# Patient Record
Sex: Female | Born: 1950 | Race: White | Hispanic: No | Marital: Married | Smoking: Former smoker
Health system: Southern US, Community
[De-identification: ages and names within clinical notes are randomized; demographics above are authoritative.]

## PROBLEM LIST (undated history)

## (undated) DIAGNOSIS — R609 Edema, unspecified: Secondary | ICD-10-CM

## (undated) DIAGNOSIS — I4891 Unspecified atrial fibrillation: Secondary | ICD-10-CM

## (undated) DIAGNOSIS — R002 Palpitations: Secondary | ICD-10-CM

## (undated) DIAGNOSIS — N183 Chronic kidney disease, stage 3 unspecified: Secondary | ICD-10-CM

## (undated) DIAGNOSIS — R079 Chest pain, unspecified: Secondary | ICD-10-CM

## (undated) DIAGNOSIS — I10 Essential (primary) hypertension: Secondary | ICD-10-CM

## (undated) DIAGNOSIS — R0600 Dyspnea, unspecified: Secondary | ICD-10-CM

## (undated) DIAGNOSIS — E119 Type 2 diabetes mellitus without complications: Secondary | ICD-10-CM

## (undated) DIAGNOSIS — K829 Disease of gallbladder, unspecified: Secondary | ICD-10-CM

## (undated) DIAGNOSIS — J302 Other seasonal allergic rhinitis: Secondary | ICD-10-CM

## (undated) DIAGNOSIS — R262 Difficulty in walking, not elsewhere classified: Secondary | ICD-10-CM

## (undated) DIAGNOSIS — K5900 Constipation, unspecified: Secondary | ICD-10-CM

## (undated) DIAGNOSIS — IMO0002 Reserved for concepts with insufficient information to code with codable children: Secondary | ICD-10-CM

## (undated) DIAGNOSIS — K219 Gastro-esophageal reflux disease without esophagitis: Secondary | ICD-10-CM

## (undated) DIAGNOSIS — F419 Anxiety disorder, unspecified: Secondary | ICD-10-CM

## (undated) DIAGNOSIS — R0602 Shortness of breath: Secondary | ICD-10-CM

## (undated) DIAGNOSIS — F32A Depression, unspecified: Secondary | ICD-10-CM

## (undated) DIAGNOSIS — M1990 Unspecified osteoarthritis, unspecified site: Secondary | ICD-10-CM

## (undated) DIAGNOSIS — D649 Anemia, unspecified: Secondary | ICD-10-CM

## (undated) DIAGNOSIS — M4800 Spinal stenosis, site unspecified: Secondary | ICD-10-CM

## (undated) DIAGNOSIS — E785 Hyperlipidemia, unspecified: Secondary | ICD-10-CM

## (undated) DIAGNOSIS — M7989 Other specified soft tissue disorders: Secondary | ICD-10-CM

## (undated) DIAGNOSIS — G4733 Obstructive sleep apnea (adult) (pediatric): Principal | ICD-10-CM

## (undated) DIAGNOSIS — R9431 Abnormal electrocardiogram [ECG] [EKG]: Secondary | ICD-10-CM

## (undated) DIAGNOSIS — R5383 Other fatigue: Secondary | ICD-10-CM

## (undated) DIAGNOSIS — J329 Chronic sinusitis, unspecified: Secondary | ICD-10-CM

## (undated) DIAGNOSIS — M545 Low back pain: Secondary | ICD-10-CM

## (undated) HISTORY — DX: Unspecified atrial fibrillation: I48.91

## (undated) HISTORY — DX: Anemia, unspecified: D64.9

## (undated) HISTORY — DX: Obstructive sleep apnea (adult) (pediatric): G47.33

## (undated) HISTORY — DX: Disease of gallbladder, unspecified: K82.9

## (undated) HISTORY — DX: Abnormal electrocardiogram (ECG) (EKG): R94.31

## (undated) HISTORY — DX: Depression, unspecified: F32.A

## (undated) HISTORY — DX: Anxiety disorder, unspecified: F41.9

## (undated) HISTORY — DX: Shortness of breath: R06.02

## (undated) HISTORY — DX: Edema, unspecified: R60.9

## (undated) HISTORY — DX: Spinal stenosis, site unspecified: M48.00

## (undated) HISTORY — DX: Other fatigue: R53.83

## (undated) HISTORY — DX: Unspecified osteoarthritis, unspecified site: M19.90

## (undated) HISTORY — DX: Chronic kidney disease, stage 3 unspecified: N18.30

## (undated) HISTORY — DX: Difficulty in walking, not elsewhere classified: R26.2

## (undated) HISTORY — PX: SKIN CANCER EXCISION: SHX779

## (undated) HISTORY — DX: Other specified soft tissue disorders: M79.89

## (undated) HISTORY — DX: Reserved for concepts with insufficient information to code with codable children: IMO0002

## (undated) HISTORY — DX: Constipation, unspecified: K59.00

## (undated) HISTORY — DX: Other seasonal allergic rhinitis: J30.2

## (undated) HISTORY — DX: Essential (primary) hypertension: I10

## (undated) HISTORY — PX: CHOLECYSTECTOMY: SHX55

## (undated) HISTORY — DX: Hyperlipidemia, unspecified: E78.5

## (undated) HISTORY — DX: Chest pain, unspecified: R07.9

## (undated) HISTORY — DX: Type 2 diabetes mellitus without complications: E11.9

## (undated) HISTORY — DX: Dyspnea, unspecified: R06.00

## (undated) HISTORY — DX: Gastro-esophageal reflux disease without esophagitis: K21.9

## (undated) HISTORY — DX: Chronic sinusitis, unspecified: J32.9

## (undated) HISTORY — DX: Low back pain: M54.5

## (undated) HISTORY — DX: Palpitations: R00.2

## (undated) HISTORY — PX: HEMORRHOID SURGERY: SHX153

## (undated) HISTORY — PX: TONSILLECTOMY: SUR1361

---

## 2006-05-20 ENCOUNTER — Emergency Department (HOSPITAL_COMMUNITY): Admission: EM | Admit: 2006-05-20 | Discharge: 2006-05-21 | Payer: Self-pay | Admitting: Emergency Medicine

## 2009-03-28 ENCOUNTER — Emergency Department (HOSPITAL_COMMUNITY): Admission: EM | Admit: 2009-03-28 | Discharge: 2009-03-28 | Payer: Self-pay | Admitting: Emergency Medicine

## 2009-03-28 ENCOUNTER — Encounter: Payer: Self-pay | Admitting: Orthopedic Surgery

## 2009-12-20 ENCOUNTER — Ambulatory Visit: Payer: Self-pay | Admitting: Orthopedic Surgery

## 2009-12-20 DIAGNOSIS — M545 Low back pain, unspecified: Secondary | ICD-10-CM

## 2009-12-20 DIAGNOSIS — IMO0002 Reserved for concepts with insufficient information to code with codable children: Secondary | ICD-10-CM | POA: Insufficient documentation

## 2009-12-20 DIAGNOSIS — M4800 Spinal stenosis, site unspecified: Secondary | ICD-10-CM

## 2009-12-20 HISTORY — DX: Low back pain, unspecified: M54.50

## 2009-12-20 HISTORY — DX: Spinal stenosis, site unspecified: M48.00

## 2009-12-20 HISTORY — DX: Reserved for concepts with insufficient information to code with codable children: IMO0002

## 2009-12-25 ENCOUNTER — Telehealth: Payer: Self-pay | Admitting: Orthopedic Surgery

## 2009-12-25 ENCOUNTER — Encounter: Payer: Self-pay | Admitting: Orthopedic Surgery

## 2010-01-03 ENCOUNTER — Ambulatory Visit: Payer: Self-pay | Admitting: Orthopedic Surgery

## 2010-01-24 ENCOUNTER — Encounter (INDEPENDENT_AMBULATORY_CARE_PROVIDER_SITE_OTHER): Payer: Self-pay | Admitting: *Deleted

## 2010-11-12 NOTE — Progress Notes (Signed)
Summary: MRI appointment.  Phone Note Outgoing Call   Call placed by: Waldon Reining,  December 25, 2009 9:31 AM Call placed to: Patient Action Taken: Appt scheduled Summary of Call: Patient has an MRI appointment at [facility] on 12-25-09 at 12:15. Patient does not have insurance. Patient will follow up back here for results.

## 2010-11-12 NOTE — Assessment & Plan Note (Signed)
Summary: 2 WK RE-CK BACK/REVIEW MRI [facility]   Visit Type:  Follow-up Referring Provider:  Vertis Kelch NP Primary Provider:  [facility] health dep  CC:  recheck back.  History of Present Illness: 60 years old, history of bilateral leg pain, hip pain, back pain since for MRI of her lumbar spine.  An MRI of the spine shows multilevel degenerative disc disease. Moderate foraminal stenosis at multiple levels consistent with spinal stenosis and degenerative arthritis of the lumbar spine.  Ibuprofen 800 for pain, no relief.  No steroids or pain medication.  Review of the MRI report , and the films, show that there is indeed, multilevel degenerative disc disease, and degenerative condition of multilevels, spinal stenosis.  Recommend anti-inflammatory medications, exercise, injections, can be done as well.  Patient cannot afford to go to therapy at this time.      Allergies (verified): No Known Drug Allergies  Review of Systems Neurologic:  Denies numbness, tingling, and unsteady gait.   Medications Added to Medication List This Visit: 1)  Diclofenac Potassium 50 Mg Tabs (Diclofenac potassium) .Marland Kitchen.. 1 by mouth two times a day  Other Orders: Est. Patient Level III (25366)  Patient Instructions: 1)  Take medication  2)  walk for xrays  3)  physical therapy is available  4)  Please schedule a follow-up appointment as needed. Prescriptions: DICLOFENAC POTASSIUM 50 MG TABS (DICLOFENAC POTASSIUM) 1 by mouth two times a day  #60 x 5   Entered and Authorized by:   Fuller Canada MD   Signed by:   Fuller Canada MD on 01/03/2010   Method used:   Print then Give to Patient   RxID:   [id number]

## 2010-11-12 NOTE — Miscellaneous (Signed)
Summary: diclofenac 75 4 dollar plan  Clinical Lists Changes  Medications: Changed medication from DICLOFENAC POTASSIUM 50 MG TABS (DICLOFENAC POTASSIUM) 1 by mouth two times a day to DICLOFENAC SODIUM 75 MG TBEC (DICLOFENAC SODIUM) on by mouth bid - Signed Added new medication of FLEXERIL 10 MG TABS (CYCLOBENZAPRINE HCL) one by mouth q 8 hrs - Signed Rx of DICLOFENAC SODIUM 75 MG TBEC (DICLOFENAC SODIUM) on by mouth bid;  #60 x 1;  Signed;  Entered by: Ether Griffins;  Authorized by: Fuller Canada MD;  Method used: Faxed to [facility] [address] [place], [place], [place]  [address], Ph: [phone], Fax: [phone] Rx of FLEXERIL 10 MG TABS (CYCLOBENZAPRINE HCL) one by mouth q 8 hrs;  #60 x 1;  Signed;  Entered by: Ether Griffins;  Authorized by: Fuller Canada MD;  Method used: Faxed to [facility] [address] [place], [place], [place]  [address], Ph: [phone], Fax: [phone]    Prescriptions: FLEXERIL 10 MG TABS (CYCLOBENZAPRINE HCL) one by mouth q 8 hrs  #60 x 1   Entered by:   Ether Griffins   Authorized by:   Fuller Canada MD   Signed by:   Ether Griffins on 01/24/2010   Method used:   Faxed to ...       [facility]* (retail)       [address] [place], [place]  [address]       Ph: [phone]       Fax: [phone]   RxID:   [id number] DICLOFENAC SODIUM 75 MG TBEC (DICLOFENAC SODIUM) on by mouth bid  #60 x 1   Entered by:   Ether Griffins   Authorized by:   Fuller Canada MD   Signed by:   Ether Griffins on 01/24/2010   Method used:   Faxed to ...       [facility]* (retail)       [address] [place], [place]  [address]       Ph: [phone]       Fax: [phone]   RxID:   [id number]

## 2010-11-12 NOTE — Letter (Signed)
Summary: Historic Patient File  Historic Patient File   Imported By: Elvera Maria 12/21/2009 09:18:43  _____________________________________________________________________  External Attachment:    Type:   Image     Comment:   history form
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
## 2011-01-20 LAB — POCT I-STAT, CHEM 8
Calcium, Ion: 1.08 mmol/L — ABNORMAL LOW (ref 1.12–1.32)
Chloride: 108 mEq/L (ref 96–112)
HCT: 39 % (ref 36.0–46.0)
TCO2: 22 mmol/L (ref 0–100)

## 2014-06-27 ENCOUNTER — Other Ambulatory Visit: Payer: Self-pay | Admitting: Family Medicine

## 2014-06-27 ENCOUNTER — Other Ambulatory Visit (HOSPITAL_COMMUNITY)
Admission: RE | Admit: 2014-06-27 | Discharge: 2014-06-27 | Disposition: A | Discharge status: Home / Self Care | Payer: BC Managed Care – PPO | Source: Ambulatory Visit | Attending: Family Medicine | Admitting: Family Medicine

## 2014-06-27 ENCOUNTER — Other Ambulatory Visit: Payer: Self-pay

## 2014-06-27 DIAGNOSIS — Z124 Encounter for screening for malignant neoplasm of cervix: Secondary | ICD-10-CM | POA: Diagnosis not present

## 2014-06-27 DIAGNOSIS — Z1231 Encounter for screening mammogram for malignant neoplasm of breast: Secondary | ICD-10-CM

## 2014-06-27 DIAGNOSIS — Z1151 Encounter for screening for human papillomavirus (HPV): Secondary | ICD-10-CM | POA: Insufficient documentation

## 2014-06-28 LAB — CYTOLOGY - PAP

## 2014-07-12 ENCOUNTER — Ambulatory Visit
Admission: RE | Admit: 2014-07-12 | Discharge: 2014-07-12 | Disposition: A | Discharge status: Home / Self Care | Payer: BC Managed Care – PPO | Source: Ambulatory Visit

## 2014-07-12 DIAGNOSIS — Z1231 Encounter for screening mammogram for malignant neoplasm of breast: Secondary | ICD-10-CM

## 2014-08-14 ENCOUNTER — Ambulatory Visit (INDEPENDENT_AMBULATORY_CARE_PROVIDER_SITE_OTHER): Payer: BC Managed Care – PPO | Admitting: Cardiology

## 2014-08-14 ENCOUNTER — Encounter: Payer: Self-pay | Admitting: Cardiology

## 2014-08-14 VITALS — BP 171/92 | HR 100 | Ht 62.0 in | Wt 330.0 lb

## 2014-08-14 DIAGNOSIS — R0602 Shortness of breath: Secondary | ICD-10-CM | POA: Insufficient documentation

## 2014-08-14 DIAGNOSIS — E669 Obesity, unspecified: Secondary | ICD-10-CM | POA: Insufficient documentation

## 2014-08-14 DIAGNOSIS — R0600 Dyspnea, unspecified: Secondary | ICD-10-CM

## 2014-08-14 DIAGNOSIS — R9431 Abnormal electrocardiogram [ECG] [EKG]: Secondary | ICD-10-CM

## 2014-08-14 DIAGNOSIS — I10 Essential (primary) hypertension: Secondary | ICD-10-CM

## 2014-08-14 HISTORY — DX: Dyspnea, unspecified: R06.00

## 2014-08-14 HISTORY — DX: Abnormal electrocardiogram (ECG) (EKG): R94.31

## 2014-08-14 NOTE — Assessment & Plan Note (Signed)
Blood pressure elevated but she has not taken her medications yet this morning. Continue to follow and adjust regimen as needed.

## 2014-08-14 NOTE — Patient Instructions (Signed)
Your physician recommends that you schedule a follow-up appointment in: AS NEEDED PENDING TEST RESULTS  Your physician has requested that you have a lexiscan myoview. For further information please visit [email or web address]. Please follow instruction sheet, as given.  DO NOT TAKE DIABETIC MEDICINE  Your physician has requested that you have an echocardiogram. Echocardiography is a painless test that uses sound waves to create images of your heart. It provides your doctor with information about the size and shape of your heart and how well your heart's chambers and valves are working. This procedure takes approximately one hour. There are no restrictions for this procedure.

## 2014-08-14 NOTE — Assessment & Plan Note (Signed)
Patient with left bundle branch block. Schedule nuclear study for risk stratification. Note she is diabetic.

## 2014-08-14 NOTE — Assessment & Plan Note (Signed)
Discussed weight loss. 

## 2014-08-14 NOTE — Progress Notes (Signed)
     HPI: 63 year old female for evaluation of dyspnea and abnormal electrocardiogram. No prior cardiac history. She does have dyspnea on exertion. There is no orthopnea or PND. There is occasional pedal edema. She has a brief pain in her chest occasionally for seconds but does not have exertional chest pain. Recently noted to have an abnormal electrocardiogram and cardiology asked to evaluate.  Current Outpatient Prescriptions  Medication Sig Dispense Refill  . aspirin 81 MG tablet Take 81 mg by mouth daily.    Marland Kitchen. atenolol (TENORMIN) 50 MG tablet Take 50 mg by mouth daily.    . cyclobenzaprine (FLEXERIL) 10 MG tablet Take 10 mg by mouth at bedtime.    . enalapril (VASOTEC) 10 MG tablet Take 10 mg by mouth daily.    . furosemide (LASIX) 40 MG tablet Take 40 mg by mouth daily.    Marland Kitchen. glipiZIDE (GLUCOTROL) 10 MG tablet Take 20 mg by mouth daily before breakfast.    . Insulin Glargine (LANTUS SOLOSTAR [place]) Inject 15 Units into the skin daily.    . meloxicam (MOBIC) 15 MG tablet Take 15 mg by mouth daily.    . metFORMIN (GLUMETZA) 1000 MG (MOD) 24 hr tablet Take 1,000 mg by mouth daily with breakfast.    . Omega-3 Fatty Acids (FISH OIL) 1000 MG CAPS Take 2 capsules by mouth daily.     No current facility-administered medications for this visit.    Allergies  Allergen Reactions  . Sulfa Antibiotics      Past Medical History  Diagnosis Date  . Diabetes mellitus   . Hypertension   . Hyperlipidemia     Past Surgical History  Procedure Laterality Date  . Tonsillectomy    . Cholecystectomy      History   Social History  . Marital Status: Married    Spouse Name: N/A    Number of Children: 1  . Years of Education: N/A   Occupational History  .      Works at [employer]   Social History Main Topics  . Smoking status: Former Games developermoker  . Smokeless tobacco: Not on file  . Alcohol Use: 0.0 oz/week    0 Not specified per week     Comment: Occasional  . Drug Use: Not on file  . Sexual  Activity: Not on file   Other Topics Concern  . Not on file   Social History Narrative  . No narrative on file    Family History  Problem Relation Age of Onset  . Heart disease Father     MI    ROS: no fevers or chills, productive cough, hemoptysis, dysphasia, odynophagia, melena, hematochezia, dysuria, hematuria, rash, seizure activity, orthopnea, PND, claudication. Remaining systems are negative.  Physical Exam:   Blood pressure 171/92, pulse 100, height 5\' 2"  (1.575 m), weight 330 lb (149.687 kg).  General:  Well developed/morbidly obese in NAD Skin warm/dry Patient not depressed No peripheral clubbing Back-normal HEENT-normal/normal eyelids Neck supple/normal carotid upstroke bilaterally; no bruits; no JVD; no thyromegaly chest - CTA/ normal expansion CV - RRR/normal S1 and S2; no rubs or gallops;  PMI nondisplaced, 2/6 SEM Abdomen -NT/ND, no HSM, no mass, + bowel sounds, no bruit 2+ femoral pulses, no bruits Ext-1+ edema, no chords, 2+ DP Neuro-grossly nonfocal  ECG Sinus tachycardia, left bundle branch block.

## 2014-08-14 NOTE — Assessment & Plan Note (Signed)
Question contribution from obesity hypoventilation syndrome/obstructive sleep apnea. Nuclear study to exclude ischemia. Will arrange echocardiogram to evaluate left ventricular systolic and diastolic function.

## 2014-08-15 ENCOUNTER — Other Ambulatory Visit: Payer: Self-pay | Admitting: *Deleted

## 2014-08-15 DIAGNOSIS — R9431 Abnormal electrocardiogram [ECG] [EKG]: Secondary | ICD-10-CM

## 2014-08-16 ENCOUNTER — Ambulatory Visit (INDEPENDENT_AMBULATORY_CARE_PROVIDER_SITE_OTHER): Payer: BC Managed Care – PPO | Admitting: Pulmonary Disease

## 2014-08-16 ENCOUNTER — Encounter: Payer: Self-pay | Admitting: Pulmonary Disease

## 2014-08-16 VITALS — BP 142/96 | HR 82 | Ht 62.5 in | Wt 333.4 lb

## 2014-08-16 DIAGNOSIS — R0683 Snoring: Secondary | ICD-10-CM

## 2014-08-16 DIAGNOSIS — R0600 Dyspnea, unspecified: Secondary | ICD-10-CM

## 2014-08-16 DIAGNOSIS — G4733 Obstructive sleep apnea (adult) (pediatric): Secondary | ICD-10-CM | POA: Insufficient documentation

## 2014-08-16 HISTORY — DX: Obstructive sleep apnea (adult) (pediatric): G47.33

## 2014-08-16 NOTE — Assessment & Plan Note (Addendum)
Likely related to obesity, deconditioning, diastolic heart failure.  She also has LBBB on recent ECG >> she is having test done by cardiology.  She has prior extensive history of smoking.  She will need PFT to further assess for obstructive lung disease >> defer this until she has her cardiac testing done first.  She ultimately would benefit from exercise/diet/weight reduction regimen.  Will get copy of her recent lab tests and chest xray from her PCP.

## 2014-08-16 NOTE — Patient Instructions (Signed)
Will arrange for sleep study Call once your heart test is done Will get copy of chest xray and lab tests from Dr. Langston ReusingNnodi's office

## 2014-08-16 NOTE — Progress Notes (Signed)
Chief Complaint  Patient presents with  . Pulmonary Consult    Referred by Dr Ihor DowNnodi for SOB.     History of Present Illness: Melanie Reeves is a 63 y.o. female former smoker evaluation of dyspnea.  She has noticed trouble with her breathing for a while.  She gets winded with just about any type of activity.  She is okay when she sits to rest.  She denies any hx of asthma.  There is no cough, sputum, or wheeze.  She denies chest pain or palpitations.  She does get occasional sinus congestion with post-nasal drip.  She denies reflux or difficulty swallowing.  She gets mild leg swelling at the end of the day.  There is no hx of DVT or PE.Marland Kitchen.  She started smoking at age 63, smoked up to 2 ppd, and quit in 1995.  She works as a Conservation officer, naturecashier.  [place]  She has a Emergency planning/management officerpet cat.  There is no hx of PNA or exposure to tuberculosis.  She was seen by cardiology earlier this month for assessment of her dyspnea.  She has Echo, and nuclear study scheduled.  She reports having recent lab work and chest xray with her PCP.  She does have trouble with her breathing while asleep.  She snores, and has been told she stops breathing while asleep.  She wakes up frequently feeling short of breath.  She gets very sleepy, and falls asleep easily during the day.  Her Epworth score is 13 out of 24.  Melanie Reeves  has a past medical history of Diabetes mellitus; Hypertension; Hyperlipidemia; and Seasonal allergies.  Melanie Reeves  has past surgical history that includes Tonsillectomy and Cholecystectomy.  Prior to Admission medications   Medication Sig Start Date End Date Taking? Authorizing Provider  aspirin 81 MG tablet Take 81 mg by mouth daily.   Yes Historical Provider, MD  atenolol (TENORMIN) 50 MG tablet Take 50 mg by mouth daily.   Yes Historical Provider, MD  cyclobenzaprine (FLEXERIL) 10 MG tablet Take 10 mg by mouth daily.    Yes Historical Provider, MD  enalapril (VASOTEC) 10 MG tablet Take  10 mg by mouth daily.   Yes Historical Provider, MD  furosemide (LASIX) 40 MG tablet Take 40 mg by mouth daily.   Yes Historical Provider, MD  glipiZIDE (GLUCOTROL) 10 MG tablet Take 20 mg by mouth daily before breakfast.   Yes Historical Provider, MD  Insulin Glargine (LANTUS SOLOSTAR [place]) Inject 20 Units into the skin every evening.    Yes Historical Provider, MD  meloxicam (MOBIC) 15 MG tablet Take 15 mg by mouth daily.   Yes Historical Provider, MD  metFORMIN (GLUMETZA) 1000 MG (MOD) 24 hr tablet Take 1,000 mg by mouth daily with breakfast.   Yes Historical Provider, MD  Omega-3 Fatty Acids (FISH OIL) 1000 MG CAPS Take 2 capsules by mouth daily.   Yes Historical Provider, MD    Allergies  Allergen Reactions  . Sulfa Antibiotics     Her family history includes Cancer - Lung in her father; Heart disease in her father.  She  reports that she has quit smoking. Her smoking use included Cigarettes. She has a 40 pack-year smoking history. She does not have any smokeless tobacco history on file. She reports that she drinks alcohol. She reports that she does not use illicit drugs.  Review of Systems  Constitutional: Negative for fever and unexpected weight change.  HENT: Positive for congestion, dental  problem and sore throat. Negative for ear pain, nosebleeds, postnasal drip, rhinorrhea, sinus pressure, sneezing and trouble swallowing.   Eyes: Negative for redness and itching.  Respiratory: Positive for shortness of breath. Negative for cough, chest tightness and wheezing.   Cardiovascular: Negative for palpitations and leg swelling.  Gastrointestinal: Negative for nausea and vomiting.  Genitourinary: Negative for dysuria.  Musculoskeletal: Positive for joint swelling.  Skin: Negative for rash.  Neurological: Negative for headaches.  Hematological: Does not bruise/bleed easily.  Psychiatric/Behavioral: Negative for dysphoric mood. The patient is not nervous/anxious.    Physical  Exam:  General - No distress, obese ENT - No sinus tenderness, no oral exudate, no LAN, no thyromegaly, TM clear, pupils equal/reactive, upper dentures, MP 3 Cardiac - s1s2 regular, no murmur, pulses symmetric Chest - No wheeze/rales/dullness, good air entry, normal respiratory excursion Back - No focal tenderness Abd - Soft, non-tender, no organomegaly, + bowel sounds Ext - 1+ non pitting edema Neuro - Normal strength, cranial nerves intact Skin - venous stasis changes Psych - Normal mood, and behavior   Lab Results  Component Value Date   HGB 13.3 03/28/2009   HCT 39.0 03/28/2009    Lab Results  Component Value Date   CREATININE 0.5 03/28/2009   BUN 9 03/28/2009   NA 139 03/28/2009   K 3.7 03/28/2009   CL 108 03/28/2009     Assessment/Plan:  Melanie HellingVineet Destry Bezdek, MD [place] [facility] Pager:  [phone]

## 2014-08-16 NOTE — Assessment & Plan Note (Signed)
She has snoring, sleep disruption, witnessed apnea, and daytime sleepiness.  She is morbidly obese and has hx of HTN and DM.  She could have OSA +/- OHS.  We discussed how sleep apnea can affect various health problems including risks for hypertension, cardiovascular disease, and diabetes.  We also discussed how sleep disruption can increase risks for accident, such as while driving.  Weight loss as a means of improving sleep apnea was also reviewed.  Additional treatment options discussed were CPAP therapy, oral appliance, and surgical intervention.  Will arrange for in lab sleep study to further assess.

## 2014-08-16 NOTE — Progress Notes (Deleted)
   Subjective:    Patient ID: Melanie Reeves, female    DOB: 07/31/1951, 63 y.o.   MRN: [id number]  HPI    Review of Systems  Constitutional: Negative for fever and unexpected weight change.  HENT: Positive for congestion, dental problem and sore throat. Negative for ear pain, nosebleeds, postnasal drip, rhinorrhea, sinus pressure, sneezing and trouble swallowing.   Eyes: Negative for redness and itching.  Respiratory: Positive for shortness of breath. Negative for cough, chest tightness and wheezing.   Cardiovascular: Negative for palpitations and leg swelling.  Gastrointestinal: Negative for nausea and vomiting.  Genitourinary: Negative for dysuria.  Musculoskeletal: Positive for joint swelling.  Skin: Negative for rash.  Neurological: Negative for headaches.  Hematological: Does not bruise/bleed easily.  Psychiatric/Behavioral: Negative for dysphoric mood. The patient is not nervous/anxious.        Objective:   Physical Exam        Assessment & Plan:

## 2014-08-18 ENCOUNTER — Telehealth (HOSPITAL_COMMUNITY): Payer: Self-pay

## 2014-08-18 NOTE — Telephone Encounter (Signed)
Encounter complete. 

## 2014-08-21 ENCOUNTER — Ambulatory Visit (HOSPITAL_COMMUNITY)
Admission: RE | Admit: 2014-08-21 | Discharge: 2014-08-21 | Disposition: A | Discharge status: Home / Self Care | Payer: BC Managed Care – PPO | Source: Ambulatory Visit | Attending: Cardiology | Admitting: Cardiology

## 2014-08-21 DIAGNOSIS — R0609 Other forms of dyspnea: Secondary | ICD-10-CM | POA: Insufficient documentation

## 2014-08-21 DIAGNOSIS — Z8249 Family history of ischemic heart disease and other diseases of the circulatory system: Secondary | ICD-10-CM | POA: Diagnosis not present

## 2014-08-21 DIAGNOSIS — E785 Hyperlipidemia, unspecified: Secondary | ICD-10-CM | POA: Insufficient documentation

## 2014-08-21 DIAGNOSIS — I059 Rheumatic mitral valve disease, unspecified: Secondary | ICD-10-CM

## 2014-08-21 DIAGNOSIS — R9431 Abnormal electrocardiogram [ECG] [EKG]: Secondary | ICD-10-CM | POA: Diagnosis present

## 2014-08-21 DIAGNOSIS — R5383 Other fatigue: Secondary | ICD-10-CM | POA: Insufficient documentation

## 2014-08-21 DIAGNOSIS — R0602 Shortness of breath: Secondary | ICD-10-CM | POA: Insufficient documentation

## 2014-08-21 DIAGNOSIS — I10 Essential (primary) hypertension: Secondary | ICD-10-CM | POA: Diagnosis not present

## 2014-08-21 DIAGNOSIS — R0600 Dyspnea, unspecified: Secondary | ICD-10-CM

## 2014-08-21 DIAGNOSIS — E119 Type 2 diabetes mellitus without complications: Secondary | ICD-10-CM | POA: Insufficient documentation

## 2014-08-21 NOTE — Progress Notes (Signed)
2D Echocardiogram Complete.  08/21/2014   Jadence Kinlaw, RDCS  

## 2014-08-22 ENCOUNTER — Telehealth (HOSPITAL_COMMUNITY): Payer: Self-pay

## 2014-08-22 NOTE — Telephone Encounter (Signed)
Encounter complete. 

## 2014-08-23 ENCOUNTER — Ambulatory Visit (HOSPITAL_COMMUNITY)
Admission: RE | Admit: 2014-08-23 | Discharge: 2014-08-23 | Disposition: A | Discharge status: Home / Self Care | Payer: BC Managed Care – PPO | Source: Ambulatory Visit | Attending: Cardiovascular Disease | Admitting: Cardiovascular Disease

## 2014-08-23 DIAGNOSIS — I10 Essential (primary) hypertension: Secondary | ICD-10-CM | POA: Diagnosis not present

## 2014-08-23 DIAGNOSIS — I447 Left bundle-branch block, unspecified: Secondary | ICD-10-CM | POA: Insufficient documentation

## 2014-08-23 DIAGNOSIS — R9431 Abnormal electrocardiogram [ECG] [EKG]: Secondary | ICD-10-CM

## 2014-08-23 DIAGNOSIS — R0609 Other forms of dyspnea: Secondary | ICD-10-CM | POA: Insufficient documentation

## 2014-08-23 DIAGNOSIS — R42 Dizziness and giddiness: Secondary | ICD-10-CM | POA: Diagnosis not present

## 2014-08-23 DIAGNOSIS — E785 Hyperlipidemia, unspecified: Secondary | ICD-10-CM | POA: Diagnosis not present

## 2014-08-23 DIAGNOSIS — Z8249 Family history of ischemic heart disease and other diseases of the circulatory system: Secondary | ICD-10-CM | POA: Insufficient documentation

## 2014-08-23 DIAGNOSIS — E119 Type 2 diabetes mellitus without complications: Secondary | ICD-10-CM | POA: Insufficient documentation

## 2014-08-23 DIAGNOSIS — E669 Obesity, unspecified: Secondary | ICD-10-CM | POA: Insufficient documentation

## 2014-08-23 DIAGNOSIS — R5383 Other fatigue: Secondary | ICD-10-CM | POA: Diagnosis not present

## 2014-08-23 DIAGNOSIS — R0600 Dyspnea, unspecified: Secondary | ICD-10-CM

## 2014-08-23 MED ORDER — REGADENOSON 0.4 MG/5ML IV SOLN
0.4000 mg | Freq: Once | INTRAVENOUS | Status: AC
  Administered 2014-08-23: 0.4 mg via INTRAVENOUS

## 2014-08-23 MED ORDER — AMINOPHYLLINE 25 MG/ML IV SOLN
125.0000 mg | Freq: Once | INTRAVENOUS | Status: AC
  Administered 2014-08-23: 125 mg via INTRAVENOUS

## 2014-08-23 MED ORDER — TECHNETIUM TC 99M SESTAMIBI GENERIC - CARDIOLITE
31.6000 | Freq: Once | INTRAVENOUS | Status: AC | PRN
  Administered 2014-08-23: 32 via INTRAVENOUS

## 2014-08-23 NOTE — Procedures (Addendum)
[place] [facility] [address] [place] [address] [phone]  Cardiology Nuclear Med Study  Melanie PilonWanda M Reeves is a 63 y.o. female     MRN : [id number]     DOB: 05/25/1951  Procedure Date: 08/23/2014  Nuclear Med Background Indication for Stress Test:  Evaluation for Ischemia and Abnormal EKG History:  Tachycardia Cardiac Risk Factors: Family History - CAD, History of Smoking, Hypertension, LBBB, Lipids, Obesity and Diabetic  Symptoms:  DOE, Fatigue, Light-Headedness and SOB   Nuclear Pre-Procedure Caffeine/Decaff Intake:  1:00am NPO After: 11:00am   IV Site: R Forearm  IV 0.9% NS with Angio Cath:  22g  Chest Size (in):  n/a IV Started by: Melanie PomfretAmanda Hicks, RN  Height: 5\' 2"  (1.575 m)  Cup Size: C  BMI:  Body mass index is 60.34 kg/(m^2). Weight:  330 lb (149.687 kg)   Tech Comments:  n/a    Nuclear Med Study 1 or 2 day study: 2 day  Stress Test Type:  Lexiscan  Order Authorizing Provider:  Olga MillersBrian Crenshaw, MD   Resting Radionuclide: Technetium 3872m Sestamibi  Resting Radionuclide Dose: 30.6 mCi   Stress Radionuclide:  Technetium 2672m Sestamibi  Stress Radionuclide Dose: 31.6 mCi           Stress Protocol Rest HR: 77 Stress HR: 84  Rest BP: 111/72 Stress BP:149/73  Exercise Time (min): n/a METS: n/a          Dose of Adenosine (mg):  n/a Dose of Lexiscan: 0.4 mg  Dose of Atropine (mg): n/a Dose of Dobutamine: n/a mcg/kg/min (at max HR)  Stress Test Technologist: Melanie MentionGwen Reeves, CCT Nuclear Technologist: Melanie LexPam Reeves, CNMT   Rest Procedure:  Myocardial perfusion imaging was performed at rest 45 minutes following the intravenous administration of Technetium 6872m Sestamibi. Stress Procedure:  The patient received IV Lexiscan 0.4 mg over 15-seconds.  Technetium 2472m Sestamibi injected IVat 30-seconds.  Patient experienced shortness of breath, chest pressure and was administered 125 mg of Aminophylline IV. There were no  significant changes with Lexiscan.  Quantitative spect images were obtained after a 45 minute delay.  Transient Ischemic Dilatation (Normal <1.22):  1.16  QGS EDV:  102 ml QGS ESV:  56 ml LV Ejection Fraction: 45%        Rest ECG: NSR-LBBB  Stress ECG: Uninteretable due to baseline LBBB  QPS Raw Data Images:  Acquisition technically good; normal left ventricular size. Stress Images:  There is decreased uptake in the anterior septal wall. Rest Images:  There is decreased uptake in the anterior septal wall. Subtraction (SDS):  No evidence of ischemia.  Impression Exercise Capacity:  Lexiscan with no exercise. BP Response:  Normal blood pressure response. Clinical Symptoms:  There is chest pain and dyspnea. ECG Impression:  Baseline:  LBBB.  EKG uninterpretable due to LBBB at rest and stress. Comparison with Prior Nuclear Study: No previous nuclear study performed  Overall Impression:  Low risk stress nuclear study with a moderate size, mild intensity, fixed anterior septal defect consistent with soft tissue attenuation; no ischemia.  LV Wall Motion:  Global hypokinesis; LV function appears better than calculated EF; suggest echo to further assess.   Melanie MillersBrian Crenshaw, MD  08/24/2014 2:57 PM

## 2014-08-24 ENCOUNTER — Ambulatory Visit (HOSPITAL_COMMUNITY)
Admission: RE | Admit: 2014-08-24 | Discharge: 2014-08-24 | Disposition: A | Discharge status: Home / Self Care | Payer: BC Managed Care – PPO | Source: Ambulatory Visit | Attending: Cardiology | Admitting: Cardiology

## 2014-08-24 ENCOUNTER — Telehealth: Payer: Self-pay | Admitting: Pulmonary Disease

## 2014-08-24 DIAGNOSIS — I447 Left bundle-branch block, unspecified: Secondary | ICD-10-CM | POA: Diagnosis not present

## 2014-08-24 DIAGNOSIS — Z8249 Family history of ischemic heart disease and other diseases of the circulatory system: Secondary | ICD-10-CM | POA: Insufficient documentation

## 2014-08-24 DIAGNOSIS — R5383 Other fatigue: Secondary | ICD-10-CM | POA: Diagnosis not present

## 2014-08-24 DIAGNOSIS — R9431 Abnormal electrocardiogram [ECG] [EKG]: Secondary | ICD-10-CM | POA: Diagnosis present

## 2014-08-24 DIAGNOSIS — Z87891 Personal history of nicotine dependence: Secondary | ICD-10-CM | POA: Insufficient documentation

## 2014-08-24 DIAGNOSIS — R0609 Other forms of dyspnea: Secondary | ICD-10-CM | POA: Diagnosis not present

## 2014-08-24 DIAGNOSIS — E785 Hyperlipidemia, unspecified: Secondary | ICD-10-CM | POA: Insufficient documentation

## 2014-08-24 DIAGNOSIS — E119 Type 2 diabetes mellitus without complications: Secondary | ICD-10-CM | POA: Diagnosis not present

## 2014-08-24 DIAGNOSIS — E669 Obesity, unspecified: Secondary | ICD-10-CM | POA: Diagnosis not present

## 2014-08-24 MED ORDER — TECHNETIUM TC 99M SESTAMIBI GENERIC - CARDIOLITE
30.6000 | Freq: Once | INTRAVENOUS | Status: AC | PRN
  Administered 2014-08-24: 31 via INTRAVENOUS

## 2014-08-24 NOTE — Telephone Encounter (Signed)
Rec'd from [facility] forward 5 pages to Dr. Craige CottaSood

## 2014-09-28 ENCOUNTER — Ambulatory Visit (INDEPENDENT_AMBULATORY_CARE_PROVIDER_SITE_OTHER): Payer: BC Managed Care – PPO | Admitting: Pulmonary Disease

## 2014-09-28 ENCOUNTER — Encounter: Payer: Self-pay | Admitting: Pulmonary Disease

## 2014-09-28 VITALS — BP 122/78 | HR 92 | Temp 98.2°F | Ht 62.5 in | Wt 321.4 lb

## 2014-09-28 DIAGNOSIS — R0600 Dyspnea, unspecified: Secondary | ICD-10-CM

## 2014-09-28 DIAGNOSIS — R0683 Snoring: Secondary | ICD-10-CM

## 2014-09-28 NOTE — Patient Instructions (Signed)
Will schedule breathing test (PFT) Will try to get sleep study done sooner Follow up in 3 months

## 2014-09-28 NOTE — Progress Notes (Signed)
Chief Complaint  Patient presents with  . Follow-up    Pt has not had sleep study yet--on waiting list. Pt has seen Cardiologist--had ECHO and CPST.     History of Present Illness: Melanie Reeves is a 63 y.o. female former smoker with dyspnea, and snoring.  She had Echo > showed mod LVH and mild diastolic dysfunction.  Myocardial perfusion scan was negative.  She is scheduled for sleep study in January, but wants this done before end of year if possible.  She is not having as much wheeze.  She still gets cough and chest congestion.  She gets winded with minimal activity >> as a result she does not do very much.  TESTS: Echo 08/21/14 >> mod LVH, EF 65 to 70%, grade 1 diastolic dysfx  Past medical hx >> DM, HTN, LBBB, Diastolic dysfunction, HLD, Allergies  Past surgical hx, Medications, Allergies, Family hx, Social hx all reviewed.   Physical Exam: Blood pressure 122/78, pulse 92, temperature 98.2 F (36.8 C), temperature source Oral, height 5' 2.5" (1.588 m), weight 321 lb 6.4 oz (145.786 kg), SpO2 97 %. Body mass index is 57.81 kg/(m^2).  General - No distress ENT - No sinus tenderness, no oral exudate, no LAN, upper dentures, MP 3 Cardiac - s1s2 regular, no murmur Chest - No wheeze/rales/dullness Back - No focal tenderness Abd - Soft, non-tender Ext - No edema Neuro - Normal strength Skin - No rashes Psych - normal mood, and behavior   Assessment/Plan:  Dyspnea >> likely from obesity with deconditioning, diastolic heart failure, and possible obstructive lung disease with prior hx of smoking. Plan: - will arrange for PFT's - encouraged her to maintain regular exercise regimen  - discussed how her weight is contributing to her symptoms  Snoring. Plan: - she is scheduled sleep study   Coralyn HellingVineet Emely Fahy, MD [place] [facility] Pager:  [phone]

## 2014-10-17 ENCOUNTER — Encounter: Payer: Self-pay | Admitting: Pulmonary Disease

## 2014-10-18 ENCOUNTER — Telehealth: Payer: Self-pay | Admitting: Pulmonary Disease

## 2014-10-18 NOTE — Telephone Encounter (Signed)
This is my new info to pre approve sleep study. Thx. Please notify me if you have any problems. I have an appointment to talk to [employer] today at 4.  Thank you  [employer] INFO 2016  [employer]  YPA [id number]  00  Group id [id number]  ven # [id number]

## 2014-10-19 NOTE — Telephone Encounter (Signed)
Called [employer] spoke to Melanie Reeves. No precert required for this study pt aware Melanie Reeves

## 2014-11-02 ENCOUNTER — Encounter (HOSPITAL_BASED_OUTPATIENT_CLINIC_OR_DEPARTMENT_OTHER): Payer: BC Managed Care – PPO

## 2014-11-14 ENCOUNTER — Ambulatory Visit (HOSPITAL_BASED_OUTPATIENT_CLINIC_OR_DEPARTMENT_OTHER): Payer: BLUE CROSS/BLUE SHIELD | Attending: Pulmonary Disease | Admitting: Radiology

## 2014-11-14 VITALS — Ht 62.0 in | Wt 330.0 lb

## 2014-11-14 DIAGNOSIS — R0683 Snoring: Secondary | ICD-10-CM | POA: Insufficient documentation

## 2014-11-27 ENCOUNTER — Encounter (HOSPITAL_COMMUNITY): Payer: Self-pay | Admitting: *Deleted

## 2014-11-27 NOTE — Progress Notes (Signed)
Consulted Dr. Gentry RochJudd to review EKG from 08/14/2014, 2 D-Echo -08/24/2014 and Stress test from 08/24/2014 for patient having Endo procedure and per Dr. Gentry RochJudd ,patient is OK to have procedure.

## 2014-11-28 ENCOUNTER — Other Ambulatory Visit: Payer: Self-pay | Admitting: Gastroenterology

## 2014-11-28 NOTE — Addendum Note (Signed)
Addended by: Elio Haden on: 11/28/2014 03:25 PM   Modules accepted: Orders  

## 2014-11-29 ENCOUNTER — Encounter (HOSPITAL_COMMUNITY): Payer: Self-pay | Admitting: *Deleted

## 2014-11-29 ENCOUNTER — Ambulatory Visit (HOSPITAL_COMMUNITY): Payer: BLUE CROSS/BLUE SHIELD | Admitting: Anesthesiology

## 2014-11-29 ENCOUNTER — Encounter (HOSPITAL_COMMUNITY)
Admission: RE | Disposition: A | Discharge status: Home / Self Care | Payer: Self-pay | Source: Ambulatory Visit | Attending: Gastroenterology

## 2014-11-29 ENCOUNTER — Ambulatory Visit (HOSPITAL_COMMUNITY)
Admission: RE | Admit: 2014-11-29 | Discharge: 2014-11-29 | Disposition: A | Discharge status: Home / Self Care | Payer: BLUE CROSS/BLUE SHIELD | Source: Ambulatory Visit | Attending: Gastroenterology | Admitting: Gastroenterology

## 2014-11-29 DIAGNOSIS — M1990 Unspecified osteoarthritis, unspecified site: Secondary | ICD-10-CM | POA: Diagnosis not present

## 2014-11-29 DIAGNOSIS — E119 Type 2 diabetes mellitus without complications: Secondary | ICD-10-CM | POA: Insufficient documentation

## 2014-11-29 DIAGNOSIS — I10 Essential (primary) hypertension: Secondary | ICD-10-CM | POA: Diagnosis not present

## 2014-11-29 DIAGNOSIS — Z87891 Personal history of nicotine dependence: Secondary | ICD-10-CM | POA: Diagnosis not present

## 2014-11-29 DIAGNOSIS — Z1211 Encounter for screening for malignant neoplasm of colon: Secondary | ICD-10-CM | POA: Insufficient documentation

## 2014-11-29 DIAGNOSIS — Z6841 Body Mass Index (BMI) 40.0 and over, adult: Secondary | ICD-10-CM | POA: Diagnosis not present

## 2014-11-29 HISTORY — PX: COLONOSCOPY WITH PROPOFOL: SHX5780

## 2014-11-29 LAB — GLUCOSE, CAPILLARY: Glucose-Capillary: 210 mg/dL — ABNORMAL HIGH (ref 70–99)

## 2014-11-29 SURGERY — COLONOSCOPY WITH PROPOFOL
Anesthesia: Monitor Anesthesia Care

## 2014-11-29 MED ORDER — LACTATED RINGERS IV SOLN
INTRAVENOUS | Status: DC | PRN
  Administered 2014-11-29: 10:00:00 via INTRAVENOUS

## 2014-11-29 MED ORDER — SODIUM CHLORIDE 0.9 % IV SOLN: INTRAVENOUS | Status: DC

## 2014-11-29 MED ORDER — PROPOFOL 10 MG/ML IV BOLUS
INTRAVENOUS | Status: AC
  Filled 2014-11-29: qty 20

## 2014-11-29 MED ORDER — PROPOFOL INFUSION 10 MG/ML OPTIME
INTRAVENOUS | Status: DC | PRN
  Administered 2014-11-29: 300 ug/kg/min via INTRAVENOUS

## 2014-11-29 SURGICAL SUPPLY — 21 items

## 2014-11-29 NOTE — Anesthesia Preprocedure Evaluation (Signed)
Anesthesia Evaluation  Patient identified by MRN, date of birth, ID band Patient awake    Reviewed: Allergy & Precautions, NPO status , Patient's Chart, lab work & pertinent test results, reviewed documented beta blocker date and time   Airway Mallampati: II  TM Distance: >3 FB Neck ROM: Full    Dental no notable dental hx.    Pulmonary shortness of breath, former smoker,  breath sounds clear to auscultation  Pulmonary exam normal       Cardiovascular hypertension, Pt. on medications and Pt. on home beta blockers Rhythm:Regular Rate:Normal     Neuro/Psych negative neurological ROS  negative psych ROS   GI/Hepatic negative GI ROS, Neg liver ROS,   Endo/Other  negative endocrine ROSdiabetes, Type 2, Oral Hypoglycemic Agents  Renal/GU negative Renal ROS     Musculoskeletal  (+) Arthritis -,   Abdominal   Peds  Hematology negative hematology ROS (+)   Anesthesia Other Findings   Reproductive/Obstetrics negative OB ROS                             Anesthesia Physical Anesthesia Plan  ASA: III  Anesthesia Plan: MAC   Post-op Pain Management:    Induction: Intravenous  Airway Management Planned:   Additional Equipment:   Intra-op Plan:   Post-operative Plan:   Informed Consent: I have reviewed the patients History and Physical, chart, labs and discussed the procedure including the risks, benefits and alternatives for the proposed anesthesia with the patient or authorized representative who has indicated his/her understanding and acceptance.   Dental advisory given  Plan Discussed with: CRNA  Anesthesia Plan Comments:         Anesthesia Quick Evaluation

## 2014-11-29 NOTE — H&P (Signed)
Patient interval history reviewed.  Patient examined again.  There has been no change from documented H/P dated 10/31/14 (scanned into chart from our office) except as documented above.  Assessment:   1.  Colon cancer screening, average-risk. 2.  Morbid Obesity.  Plan:  1.  Colonoscopy. 2.  Risks (bleeding, infection, bowel perforation that could require surgery, sedation-related changes in cardiopulmonary systems), benefits (identification and possible treatment of source of symptoms, exclusion of certain causes of symptoms), and alternatives (watchful waiting, radiographic imaging studies, empiric medical treatment) of colonoscopy were explained to patient/family in detail and patient wishes to proceed.

## 2014-11-29 NOTE — Transfer of Care (Signed)
Immediate Anesthesia Transfer of Care Note  Patient: Melanie Reeves  Procedure(s) Performed: Procedure(s): COLONOSCOPY WITH PROPOFOL (N/A)  Patient Location: PACU and Endoscopy Unit  Anesthesia Type:MAC  Level of Consciousness: awake, alert  and patient cooperative  Airway & Oxygen Therapy: Patient Spontanous Breathing and Patient connected to face mask oxygen  Post-op Assessment: Report given to RN and Post -op Vital signs reviewed and stable  Post vital signs: Reviewed and stable  Last Vitals:  Filed Vitals:   11/29/14 0941  BP: 139/60  Temp: 36.6 C  Resp: 16    Complications: No apparent anesthesia complications

## 2014-11-29 NOTE — Op Note (Signed)
[facility] [address] [place], [id number]   COLONOSCOPY PROCEDURE REPORT  PATIENT: Melanie Reeves, [name]  MR#: [id number] BIRTHDATE: 1951/03/11 , 63  yrs. old GENDER: female ENDOSCOPIST: Willis ModenaWilliam Sakira Dahmer, MD REFERRED LK:GMWNUBY:ADAKU NNODI, M.D. PROCEDURE DATE:  11/29/2014 PROCEDURE:   Colonoscopy, screening ASA CLASS:   Class III INDICATIONS:average risk patient for colon cancer. MEDICATIONS: Monitored anesthesia care  DESCRIPTION OF PROCEDURE:   After the risks benefits and alternatives of the procedure were thoroughly explained, informed consent was obtained.  Digital rectal exam revealed no abnormalities of the rectum.   The adult colonoscope was introduced through the anus and advanced to the cecum, which was identified by both the appendix and ileocecal valve. No adverse events experienced.   The quality of the prep was good.  The instrument was then slowly withdrawn as the colon was fully examined.    Findings:  Normal digital rectal exam.  Prep quality was good.  No diverticula evident.  No polyps, masses, vascular ectasias, or inflammatory changes were seen.  Normal retroflexed view of the rectum.           Withdrawal time was 9 minutes     .  The scope was withdrawn and the procedure completed.  COMPLICATIONS:  ENDOSCOPIC IMPRESSION:     Normal colonoscopy to cecum.  RECOMMENDATIONS:     1.  Watch for potential complications of procedure. 2.  Repeat colonoscopy in 10 years, in absence of new/interval symptoms (change in bowel habits, new onset anemia, overt blood in stool, unintentional weight loss). 3.  [facility] on as-needed basis.   eSigned:  Willis ModenaWilliam Willow Shidler, MD 11/29/2014 11:21 AM   cc:  CPT CODES: ICD CODES:  The ICD and CPT codes recommended by this software are interpretations from the data that the clinical staff has captured with the software.  The verification of the translation of this report to the ICD and  CPT codes and modifiers is the sole responsibility of the health care institution and practicing physician where this report was generated.  [employer] will not be held responsible for the validity of the ICD and CPT codes included on this report.  AMA assumes no liability for data contained or not contained herein. CPT is a Publishing rights managerregistered trademark of the [employer].

## 2014-11-29 NOTE — Anesthesia Postprocedure Evaluation (Signed)
Anesthesia Post Note  Patient: Melanie Reeves  Procedure(s) Performed: Procedure(s) (LRB): COLONOSCOPY WITH PROPOFOL (N/A)  Anesthesia type: MAC  Patient location: PACU  Post pain: Pain level controlled  Post assessment: Post-op Vital signs reviewed  Last Vitals: BP 146/60 mmHg  Pulse 86  Temp(Src) 36.7 C (Oral)  Resp 21  SpO2 97%  Post vital signs: Reviewed  Level of consciousness: awake  Complications: No apparent anesthesia complications

## 2014-11-30 ENCOUNTER — Encounter (HOSPITAL_COMMUNITY): Payer: Self-pay | Admitting: Gastroenterology

## 2014-12-01 ENCOUNTER — Telehealth: Payer: Self-pay | Admitting: Pulmonary Disease

## 2014-12-01 DIAGNOSIS — G4733 Obstructive sleep apnea (adult) (pediatric): Secondary | ICD-10-CM

## 2014-12-01 NOTE — Sleep Study (Unsigned)
[facility]  NAME: Melanie Reeves DATE OF BIRTH:  03/20/1951 MEDICAL RECORD NUMBER [id number]  LOCATION: [facility]  PHYSICIAN: Coralyn HellingVineet Luberta Grabinski, M.D. DATE OF STUDY: 11/14/2014  SLEEP STUDY TYPE: Polysomnogram               REFERRING PHYSICIAN: Coralyn HellingSood, Darrnell Mangiaracina, MD  INDICATION FOR STUDY:  Melanie Reeves is a 64 y.o. female who presents to the sleep lab for evaluation of hypersomnia with obstructive sleep apnea.  She reports snoring, sleep disruption, apnea, and daytime sleepiness.  EPWORTH SLEEPINESS SCORE: 20. HEIGHT: 5\' 2"  (157.5 cm)  WEIGHT: 330 lb    Body mass index is 60.34 kg/(m^2).  NECK SIZE: 18 in.  MEDICATIONS:  Current Outpatient Prescriptions on File Prior to Visit  Medication Sig Dispense Refill  . aspirin 81 MG tablet Take 81 mg by mouth daily.    Marland Kitchen. atenolol (TENORMIN) 50 MG tablet Take 50 mg by mouth every Reeves.     . cyclobenzaprine (FLEXERIL) 10 MG tablet Take 10 mg by mouth daily.     . enalapril (VASOTEC) 10 MG tablet Take 10 mg by mouth every Reeves.     . furosemide (LASIX) 40 MG tablet Take 40 mg by mouth every Reeves.     Marland Kitchen. glipiZIDE (GLUCOTROL) 10 MG tablet Take 20 mg by mouth daily before breakfast.    . Insulin Glargine (LANTUS SOLOSTAR [place]) Inject 30 Units into the skin every evening.     . meloxicam (MOBIC) 15 MG tablet Take 15 mg by mouth daily.    . metFORMIN (GLUMETZA) 1000 MG (MOD) 24 hr tablet Take 1,000 mg by mouth daily with breakfast.    . Omega-3 Fatty Acids (FISH OIL) 1000 MG CAPS Take 2 capsules by mouth daily.     No current facility-administered medications on file prior to visit.    SLEEP ARCHITECTURE:  Total recording time: 370.5 minutes.  Total sleep time was: 221.5 minutes.  Sleep efficiency: 59.8%.  Sleep latency: 36.5 minutes.  REM latency: 172 minutes.  Stage N1: 19%.  Stage N2: 68.8%.  Stage N3: 0%.  Stage R:  12.2%.  Supine sleep: 1 minutes.  Non-supine sleep: 220.5 minutes.  CARDIAC  DATA:  Average heart rate: 89 beats per minute. Rhythm strip: sinus rhythm with PVCs.  RESPIRATORY DATA: Average respiratory rate: 20. Snoring: loud. Average AHI: 13.   Apnea index: 1.4.  Hypopnea index: 11.6. Obstructive apnea index: 0.8.  Central apnea index: 0.3.  Mixed apnea index: 0.3. REM AHI: 46.7.  NREM AHI: 8.3. Supine AHI: 0. Non-supine AHI: 8.  MOVEMENT/PARASOMNIA:  Periodic limb movement: 0.  Period limb movements with arousals: 0. Restroom trips: none.  OXYGEN DATA:  Baseline oxygenation: 98%. Lowest SaO2: 78%. Time spent below SaO2 90%: 9.7 minutes. Supplemental oxygen used: none.  IMPRESSION/ RECOMMENDATION:   This study showed mild obstructive sleep apnea with an AHI of 13 and SaO2 low of 78%.  She did have significant REM affect.   Additional therapies include weight loss, CPAP, oral appliance, or surgical evaluation.   Coralyn HellingVineet Samanth Mirkin, M.D. Diplomate, Biomedical engineerAmerican Board of Sleep Medicine  ELECTRONICALLY SIGNED ON:  12/01/2014, 2:03 PM [facility] PH: [phone]   FX: [phone] ACCREDITED BY THE AMERICAN ACADEMY OF SLEEP MEDICINE

## 2014-12-01 NOTE — Telephone Encounter (Signed)
PSG 11/14/14 >> AHI 13, SaO2 78%, REM AHI 46.7  Will have my nurse inform pt that sleep study shows mild sleep apnea.  Options are 1) CPAP now, or 2) ROV now.  If she wants to try CPAP, then send order for auto CPAP with range 5 to 15 cm H2O with heated humidity and mask of choice.  Have download sent after 1 month, and schedule ROV after 2 months.

## 2014-12-01 NOTE — Telephone Encounter (Signed)
lmtcb

## 2014-12-04 NOTE — Telephone Encounter (Signed)
Called and spoke to pt. Informed pt of the results per VS. Pt stated she would prefer to have OV with VS before getting set up with CPAP. Pt already has appt with VS on 3/21. No sooner appts. Pt verbalized understanding and denied any further questions or concerns at this time.

## 2014-12-04 NOTE — Telephone Encounter (Signed)
Pt calling back

## 2014-12-25 ENCOUNTER — Emergency Department (INDEPENDENT_AMBULATORY_CARE_PROVIDER_SITE_OTHER)
Admission: EM | Admit: 2014-12-25 | Discharge: 2014-12-25 | Payer: BLUE CROSS/BLUE SHIELD | Source: Home / Self Care | Attending: Family Medicine | Admitting: Family Medicine

## 2014-12-25 ENCOUNTER — Encounter (HOSPITAL_COMMUNITY): Payer: Self-pay

## 2014-12-25 ENCOUNTER — Encounter (HOSPITAL_COMMUNITY): Payer: Self-pay | Admitting: *Deleted

## 2014-12-25 ENCOUNTER — Emergency Department (HOSPITAL_COMMUNITY)
Admission: EM | Admit: 2014-12-25 | Discharge: 2014-12-25 | Disposition: A | Discharge status: Home / Self Care | Payer: BLUE CROSS/BLUE SHIELD | Attending: Emergency Medicine | Admitting: Emergency Medicine

## 2014-12-25 ENCOUNTER — Emergency Department (HOSPITAL_COMMUNITY): Payer: BLUE CROSS/BLUE SHIELD

## 2014-12-25 ENCOUNTER — Other Ambulatory Visit: Payer: Self-pay

## 2014-12-25 DIAGNOSIS — R079 Chest pain, unspecified: Secondary | ICD-10-CM

## 2014-12-25 DIAGNOSIS — R110 Nausea: Secondary | ICD-10-CM | POA: Insufficient documentation

## 2014-12-25 DIAGNOSIS — R0602 Shortness of breath: Secondary | ICD-10-CM | POA: Diagnosis not present

## 2014-12-25 DIAGNOSIS — E785 Hyperlipidemia, unspecified: Secondary | ICD-10-CM | POA: Diagnosis not present

## 2014-12-25 DIAGNOSIS — Z7982 Long term (current) use of aspirin: Secondary | ICD-10-CM | POA: Diagnosis not present

## 2014-12-25 DIAGNOSIS — I10 Essential (primary) hypertension: Secondary | ICD-10-CM | POA: Insufficient documentation

## 2014-12-25 DIAGNOSIS — R0600 Dyspnea, unspecified: Secondary | ICD-10-CM

## 2014-12-25 DIAGNOSIS — E119 Type 2 diabetes mellitus without complications: Secondary | ICD-10-CM | POA: Insufficient documentation

## 2014-12-25 DIAGNOSIS — Z794 Long term (current) use of insulin: Secondary | ICD-10-CM | POA: Insufficient documentation

## 2014-12-25 DIAGNOSIS — E669 Obesity, unspecified: Secondary | ICD-10-CM | POA: Diagnosis not present

## 2014-12-25 DIAGNOSIS — Z87891 Personal history of nicotine dependence: Secondary | ICD-10-CM | POA: Insufficient documentation

## 2014-12-25 LAB — CBC
HCT: 36.9 % (ref 36.0–46.0)
Hemoglobin: 11.7 g/dL — ABNORMAL LOW (ref 12.0–15.0)
MCH: 28.9 pg (ref 26.0–34.0)
MCHC: 31.7 g/dL (ref 30.0–36.0)
MCV: 91.1 fL (ref 78.0–100.0)
Platelets: 311 10*3/uL (ref 150–400)
RBC: 4.05 MIL/uL (ref 3.87–5.11)
RDW: 14.1 % (ref 11.5–15.5)
WBC: 16.2 10*3/uL — AB (ref 4.0–10.5)

## 2014-12-25 LAB — BASIC METABOLIC PANEL
ANION GAP: 11 (ref 5–15)
BUN: 27 mg/dL — ABNORMAL HIGH (ref 6–23)
CO2: 23 mmol/L (ref 19–32)
CREATININE: 1.16 mg/dL — AB (ref 0.50–1.10)
Calcium: 9.2 mg/dL (ref 8.4–10.5)
Chloride: 104 mmol/L (ref 96–112)
GFR, EST AFRICAN AMERICAN: 58 mL/min — AB (ref >90)
GFR, EST NON AFRICAN AMERICAN: 50 mL/min — AB (ref >90)
Glucose, Bld: 125 mg/dL — ABNORMAL HIGH (ref 70–99)
Potassium: 4.3 mmol/L (ref 3.5–5.1)
Sodium: 138 mmol/L (ref 135–145)

## 2014-12-25 LAB — I-STAT TROPONIN, ED
TROPONIN I, POC: 0 ng/mL (ref 0.00–0.08)
Troponin i, poc: 0 ng/mL (ref 0.00–0.08)

## 2014-12-25 MED ORDER — ASPIRIN 81 MG PO CHEW
CHEWABLE_TABLET | ORAL | Status: AC
  Filled 2014-12-25: qty 4

## 2014-12-25 MED ORDER — NITROGLYCERIN 0.4 MG SL SUBL: 0.4000 mg | SUBLINGUAL_TABLET | Freq: Once | SUBLINGUAL | Status: DC

## 2014-12-25 MED ORDER — ASPIRIN 81 MG PO CHEW
324.0000 mg | CHEWABLE_TABLET | Freq: Once | ORAL | Status: AC
  Administered 2014-12-25: 324 mg via ORAL

## 2014-12-25 MED ORDER — NITROGLYCERIN 0.4 MG SL SUBL
SUBLINGUAL_TABLET | SUBLINGUAL | Status: AC
  Filled 2014-12-25: qty 1

## 2014-12-25 MED ORDER — SODIUM CHLORIDE 0.9 % IV SOLN: Freq: Once | INTRAVENOUS | Status: DC

## 2014-12-25 MED ORDER — SODIUM CHLORIDE 0.9 % IV SOLN
INTRAVENOUS | Status: DC
  Administered 2014-12-25: 16:00:00 via INTRAVENOUS

## 2014-12-25 NOTE — ED Notes (Signed)
CM dinner tray ordered 

## 2014-12-25 NOTE — ED Notes (Signed)
Pt presents via [facility] from [facility], c/o of intermittent centralized CP beginning this AM.  Pt states she recently had a stress test and sleep study done.  BP-113/73 P-81 R-18-20 O2-98% 2L.  Pt alert x 4, NAD.  Able to speak in complete, full sentences.

## 2014-12-25 NOTE — ED Provider Notes (Addendum)
CSN: [id number]     Arrival date & time 12/25/14  1457 History   First MD Initiated Contact with Patient 12/25/14 1528     Chief Complaint  Patient presents with  . Chest Pain  . Shortness of Breath   (Consider location/radiation/quality/duration/timing/severity/associated sxs/prior Treatment) HPI Comments: Symptoms intensified with minimal exertion while at work this morning.  PCP: Dr. Sampson SiBaity Cardiologist: Dr. Velta AddisonB. Crenshaw Had normal (non-contrast) myocardial perfusion study in Nov. 2015.   Patient is a 64 y.o. female presenting with chest pain and shortness of breath. The history is provided by the patient.  Chest Pain Pain location:  L chest and substernal area Pain quality: tightness   Pain radiates to:  Does not radiate Pain radiates to the back: yes   Pain severity:  Moderate Onset quality:  Gradual Duration: sx began at 1100am today while at work. Timing:  Constant Chronicity:  New Associated symptoms: back pain, dizziness, fatigue, nausea and shortness of breath   Associated symptoms comment:  +lightheadedness Shortness of Breath Associated symptoms: chest pain     Past Medical History  Diagnosis Date  . Diabetes mellitus   . Hypertension   . Hyperlipidemia   . Seasonal allergies    Past Surgical History  Procedure Laterality Date  . Tonsillectomy    . Cholecystectomy    . Colonoscopy with propofol N/A 11/29/2014    Procedure: COLONOSCOPY WITH PROPOFOL;  Surgeon: Willis ModenaWilliam Outlaw, MD;  Location: [facility] END[facility];  Service: Endoscopy;  Laterality: N/A;   Family History  Problem Relation Age of Onset  . Heart disease Father     MI  . Cancer - Lung Father     Small cell Lung CA   History  Substance Use Topics  . Smoking status: Former Smoker -- 2.00 packs/day for 20 years    Types: Cigarettes    Quit date: 11/22/1993  . Smokeless tobacco: Not on file  . Alcohol Use: 0.0 oz/week    0 Standard drinks or equivalent per week     Comment: Occasional   OB History     No data available     Review of Systems  Constitutional: Positive for fatigue.  Eyes: Negative.   Respiratory: Positive for chest tightness and shortness of breath.   Cardiovascular: Positive for chest pain.  Gastrointestinal: Positive for nausea.  Musculoskeletal: Positive for back pain.  Skin: Negative.   Neurological: Positive for dizziness.    Allergies  Sulfa antibiotics  Home Medications   Prior to Admission medications   Medication Sig Start Date End Date Taking? Authorizing Provider  aspirin 81 MG tablet Take 81 mg by mouth daily.    Historical Provider, MD  atenolol (TENORMIN) 50 MG tablet Take 50 mg by mouth every morning.     Historical Provider, MD  cyclobenzaprine (FLEXERIL) 10 MG tablet Take 10 mg by mouth daily.     Historical Provider, MD  enalapril (VASOTEC) 10 MG tablet Take 10 mg by mouth every morning.     Historical Provider, MD  furosemide (LASIX) 40 MG tablet Take 40 mg by mouth every morning.     Historical Provider, MD  glipiZIDE (GLUCOTROL) 10 MG tablet Take 20 mg by mouth daily before breakfast.    Historical Provider, MD  Insulin Glargine (LANTUS SOLOSTAR [place]) Inject 30 Units into the skin every evening.     Historical Provider, MD  meloxicam (MOBIC) 15 MG tablet Take 15 mg by mouth daily.    Historical Provider, MD  metFORMIN (GLUMETZA) 1000 MG (  MOD) 24 hr tablet Take 1,000 mg by mouth daily with breakfast.    Historical Provider, MD  Omega-3 Fatty Acids (FISH OIL) 1000 MG CAPS Take 2 capsules by mouth daily.    Historical Provider, MD  traZODone (DESYREL) 100 MG tablet Take 100 mg by mouth at bedtime as needed for sleep.    Historical Provider, MD   BP 163/88 mmHg  Pulse 83  Temp(Src) 98.4 F (36.9 C) (Oral)  Resp 16  SpO2 95% Physical Exam  Constitutional: She is oriented to person, place, and time. She appears well-developed and well-nourished. No distress.  +morbid obesity  HENT:  Head: Normocephalic and atraumatic.  Eyes: Conjunctivae  are normal. No scleral icterus.  Cardiovascular: Normal rate, regular rhythm and normal heart sounds.   Pulmonary/Chest: Effort normal and breath sounds normal.  Musculoskeletal: Normal range of motion.  Neurological: She is alert and oriented to person, place, and time.  Skin: Skin is warm and dry.  Psychiatric: She has a normal mood and affect. Her behavior is normal.  Nursing note and vitals reviewed.   ED Course  Procedures (including critical care time) Labs Review Labs Reviewed - No data to display  Imaging Review No results found.   MDM   1. Chest pain, unspecified chest pain type   2. Dyspnea   ECG: NSR @ 84 BPM with LBBB and left axis deviation. No acute changes from 09/03/2014 tracing.  Patient placed on monitor, IV NS started, O2 via [place], aspirin 324 mg po, and NTG 0.4mg  SL given. EMS contacted and patient transferred to [facility] ER via EMS for further evaluation.      Ria Clock, [place] 12/25/14 1654  01/01/2015 (Addendum): I personally reviewed ECG on DOS 12/25/2014 and agree with computerized interpretation  Ria Clock, PA 01/01/15 1250

## 2014-12-25 NOTE — Discharge Instructions (Signed)
Your work-up today was negative. You may follow-up with your primary care physician. Return to the ED for new concerns.

## 2014-12-25 NOTE — ED Notes (Signed)
[facility] called to transport patient.Melissa , CN, advised of pending [facility] transfer

## 2014-12-25 NOTE — ED Notes (Signed)
Pt verbalized understanding of d/c instrfuctions and to follow up with pcp and has no further questions.

## 2014-12-25 NOTE — ED Notes (Signed)
Patient denies pain at present. No NTG at this tims

## 2014-12-25 NOTE — ED Notes (Signed)
States she has ben having pain in her mid chest, w nausea, SOB since about 2 pm

## 2014-12-25 NOTE — ED Provider Notes (Signed)
CSN: [id number]     Arrival date & time 12/25/14  1633 History   First MD Initiated Contact with Patient 12/25/14 1640     Chief Complaint  Patient presents with  . Chest Pain     (Consider location/radiation/quality/duration/timing/severity/associated sxs/prior Treatment) The history is provided by the patient and medical records.    This is a 64 year old female with past medical history significant for hypertension, diabetes, hyperlipidemia, seasonal allergies, presenting to the ED for chest pain. Patient states symptoms began earlier this morning around 1100 while at work. 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  Patient states pain has been intermittent since that time. States when pain occurs it is localized to the left side of her chest and substernal region. States pain will last approximately 30 seconds before resolving. States when she has active pain she feels that it "takes her breath away." She states she has chronic nausea which she thinks may have been worsened as she had multiple pia colada's over the weekend at a party. She denies any vomiting or diaphoresis. Patient has no known cardiac history. No family cardiac history. She is a former smoker, quit approximately 20 years ago. She was evaluated by cardiology in November 2015, Dr. Jens Som. She had a 2-D echo at that time with the following conclusions:   LVEF 65-70%, moderate LVH, diastolic dysfunction, normal LV filling pressure, trivial TR, RVSP 26 mmHg.  Patient denies any active chest pain or SOB on arrival to ED.  She received  ASA prior to arrival.  VSS.  Past Medical History  Diagnosis Date  . Diabetes mellitus   . Hypertension   . Hyperlipidemia   . Seasonal allergies    Past Surgical History  Procedure Laterality Date  . Tonsillectomy    . Cholecystectomy    . Colonoscopy with propofol N/A 11/29/2014    Procedure: COLONOSCOPY WITH PROPOFOL;  Surgeon: Willis Modena, MD;  Location: [facility]  ENDOSCOPY;  Service: Endoscopy;  Laterality: N/A;   Family History  Problem Relation Age of Onset  . Heart disease Father     MI  . Cancer - Lung Father     Small cell Lung CA   History  Substance Use Topics  . Smoking status: Former Smoker -- 2.00 packs/day for 20 years    Types: Cigarettes    Quit date: 11/22/1993  . Smokeless tobacco: Not on file  . Alcohol Use: 0.0 oz/week    0 Standard drinks or equivalent per week     Comment: Occasional   OB History    No data available     Review of Systems  Respiratory: Positive for shortness of breath.   Cardiovascular: Positive for chest pain.  Gastrointestinal: Positive for nausea.  Neurological: Positive for light-headedness.  All other systems reviewed and are negative.     Allergies  Sulfa antibiotics  Home Medications   Prior to Admission medications   Medication Sig Start Date End Date Taking? Authorizing Provider  aspirin 81 MG tablet Take 81 mg by mouth daily.    Historical Provider, MD  atenolol (TENORMIN) 50 MG tablet Take 50 mg by mouth every morning.     Historical Provider, MD  cyclobenzaprine (FLEXERIL) 10 MG tablet Take 10 mg by mouth daily.     Historical Provider, MD  enalapril (VASOTEC) 10 MG tablet Take 10 mg by mouth every morning.     Historical Provider, MD  furosemide (LASIX) 40 MG tablet Take 40 mg by mouth every  morning.     Historical Provider, MD  glipiZIDE (GLUCOTROL) 10 MG tablet Take 20 mg by mouth daily before breakfast.    Historical Provider, MD  Insulin Glargine (LANTUS SOLOSTAR [place]) Inject 30 Units into the skin every evening.     Historical Provider, MD  meloxicam (MOBIC) 15 MG tablet Take 15 mg by mouth daily.    Historical Provider, MD  metFORMIN (GLUMETZA) 1000 MG (MOD) 24 hr tablet Take 1,000 mg by mouth daily with breakfast.    Historical Provider, MD  Omega-3 Fatty Acids (FISH OIL) 1000 MG CAPS Take 2 capsules by mouth daily.    Historical Provider, MD  traZODone (DESYREL) 100 MG  tablet Take 100 mg by mouth at bedtime as needed for sleep.    Historical Provider, MD   BP 125/74 mmHg  Pulse 82  Temp(Src) 97.5 F (36.4 C) (Oral)  Resp 19  Ht 5\' 2"  (1.575 m)  Wt 330 lb (149.687 kg)  BMI 60.34 kg/m2  SpO2 97%   Physical Exam  Constitutional: She is oriented to person, place, and time. She appears well-developed and well-nourished.  obese  HENT:  Head: Normocephalic and atraumatic.  Mouth/Throat: Oropharynx is clear and moist.  Eyes: Conjunctivae and EOM are normal. Pupils are equal, round, and reactive to light.  Neck: Normal range of motion.  Cardiovascular: Normal rate, regular rhythm and normal heart sounds.   Pulmonary/Chest: Effort normal and breath sounds normal. No respiratory distress. She has no wheezes.  Respirations unlabored, no distress, speaking in full complete sentences without difficulty  Abdominal: Soft. Bowel sounds are normal. There is no tenderness. There is no guarding.  Musculoskeletal: Normal range of motion.  No calf asymmetry, tenderness, or palpable cords; no overlying erythema or warmth to touch  Neurological: She is alert and oriented to person, place, and time.  Skin: Skin is warm and dry.  Psychiatric: She has a normal mood and affect.  Nursing note and vitals reviewed.   ED Course  Procedures (including critical care time) Labs Review Labs Reviewed  CBC - Abnormal; Notable for the following:    WBC 16.2 (*)    Hemoglobin 11.7 (*)    All other components within normal limits  BASIC METABOLIC PANEL - Abnormal; Notable for the following:    Glucose, Bld 125 (*)    BUN 27 (*)    Creatinine, Ser 1.16 (*)    GFR calc non Af Amer 50 (*)    GFR calc Af Amer 58 (*)    All other components within normal limits  I-STAT TROPOININ, ED  Rose[place]a SensorI-STAT TROPOININ, ED    Imaging Review Dg Chest 2 View  12/25/2014   CLINICAL DATA:  Initial evaluation of chest pain and shortness of breath since 2 p.m. today  EXAM: CHEST  2 VIEW   COMPARISON:  07/14/2014  FINDINGS: Heart size upper normal and stable. Vascular pattern normal. Lungs clear. No effusion.  IMPRESSION: No active cardiopulmonary disease.   Electronically Signed   By: Esperanza Heiraymond  Rubner M.D.   On: 12/25/2014 17:37     EKG Interpretation   Date/Time:  Monday December 25 2014 16:38:16 EDT Ventricular Rate:  83 PR Interval:  141 QRS Duration: 152 QT Interval:  414 QTC Calculation: 486 R Axis:   -13 Text Interpretation:  Sinus rhythm Left bundle branch block No significant  change since last tracing Confirmed by Mirian MoGentry, Matthew [phone]([id number]) on  12/25/2014 4:48:19 PM      MDM   Final diagnoses:  Chest pain  64 year old female with intermittent chest pain throughout the day today. Pain with some typical and atypical features.  Patient evaluated at urgent care and sent here for further evaluation. EKG sinus rhythm was LBBB, unchanged from previous.  Lab work including troponin x2 is negative.  Patient has remained pain free here in ED.  Patient has no noted RF or hx of DVT/PE, HEART score of 3. Given negative work-up here today, low suspicion for ACS, PE, dissection, or other acute cardiac event at this time.  Patient will be discharged home with close PCP FU.  Will FU with cardiology as needed, has seen Dr. Jens Som in the past.  Discussed plan with patient, he/she acknowledged understanding and agreed with plan of care.  Return precautions given for new or worsening symptoms.  Case discussed with attending physician, Dr. Littie Deeds, who agrees with assessment and plan of care.  Garlon Hatchet, PA-C 12/25/14 2359  Mirian Mo, MD 12/27/14 [phone]

## 2015-01-01 ENCOUNTER — Encounter: Payer: Self-pay | Admitting: Pulmonary Disease

## 2015-01-01 ENCOUNTER — Ambulatory Visit (INDEPENDENT_AMBULATORY_CARE_PROVIDER_SITE_OTHER): Payer: BLUE CROSS/BLUE SHIELD | Admitting: Pulmonary Disease

## 2015-01-01 VITALS — BP 110/70 | HR 86 | Ht 63.0 in | Wt 331.6 lb

## 2015-01-01 DIAGNOSIS — R0600 Dyspnea, unspecified: Secondary | ICD-10-CM

## 2015-01-01 DIAGNOSIS — G4733 Obstructive sleep apnea (adult) (pediatric): Secondary | ICD-10-CM

## 2015-01-01 LAB — PULMONARY FUNCTION TEST
DL/VA % pred: 126 %
DL/VA: 5.91 ml/min/mmHg/L
DLCO UNC % PRED: 89 %
DLCO UNC: 20.54 ml/min/mmHg
FEF 25-75 POST: 2.3 L/s
FEF 25-75 Pre: 2.01 L/sec
FEF2575-%Change-Post: 14 %
FEF2575-%Pred-Post: 103 %
FEF2575-%Pred-Pre: 90 %
FEV1-%Change-Post: 4 %
FEV1-%Pred-Post: 68 %
FEV1-%Pred-Pre: 65 %
FEV1-POST: 1.65 L
FEV1-Pre: 1.57 L
FEV1FVC-%CHANGE-POST: 1 %
FEV1FVC-%Pred-Pre: 108 %
FEV6-%Change-Post: 3 %
FEV6-%PRED-PRE: 61 %
FEV6-%Pred-Post: 63 %
FEV6-POST: 1.93 L
FEV6-PRE: 1.87 L
FEV6FVC-%PRED-POST: 104 %
FEV6FVC-%PRED-PRE: 104 %
FVC-%CHANGE-POST: 3 %
FVC-%PRED-PRE: 59 %
FVC-%Pred-Post: 61 %
FVC-PRE: 1.87 L
FVC-Post: 1.93 L
POST FEV6/FVC RATIO: 100 %
PRE FEV1/FVC RATIO: 84 %
Post FEV1/FVC ratio: 85 %
Pre FEV6/FVC Ratio: 100 %
RV % PRED: 70 %
RV: 1.38 L
TLC % PRED: 70 %
TLC: 3.45 L

## 2015-01-01 NOTE — Progress Notes (Signed)
Chief Complaint  Patient presents with  . Follow-up    Review PFT. Pt reports being in ED 12/25/14 for SOB and dizziness.     History of Present Illness: Melanie Reeves is a 64 y.o. female former smoker with dyspnea, and OSA.  She is here to review sleep study >> mild OSA, and PFT >> mild restriction.  She was in ER earlier this month for dyspnea.  CXR and labs were unremarkable.  She gets winded with activity, but is okay at rest.  TESTS: Echo 08/21/14 >> mod LVH, EF 65 to 70%, grade 1 diastolic dysfx PSG 11/14/14 >> AHI 13, SaO2 78%, REM AHI 46.7 PFT 01/01/15 >> FEV1 1.65 (68%), FEV1% 85, TLC 3.45 (70%), DLCO 89%, no BD  Past medical hx >> DM, HTN, LBBB, Diastolic dysfunction, HLD, Allergies  Past surgical hx, Medications, Allergies, Family hx, Social hx all reviewed.   Physical Exam: Blood pressure 110/70, pulse 86, height 5\' 3"  (1.6 m), weight 331 lb 9.6 oz (150.413 kg), SpO2 97 %.  Body mass index is 58.76 kg/(m^2).  General - No distress ENT - No sinus tenderness, no oral exudate, no LAN, upper dentures, MP 3 Cardiac - s1s2 regular, no murmur Chest - No wheeze/rales/dullness Back - No focal tenderness Abd - Soft, non-tender Ext - No edema Neuro - Normal strength Skin - No rashes Psych - normal mood, and behavior   CBC Latest Ref Rng 12/25/2014 03/28/2009  WBC 4.0 - 10.5 K/uL 16.2(H) -  Hemoglobin 12.0 - 15.0 g/dL 11.7(L) 13.3  Hematocrit 36.0 - 46.0 % 36.9 39.0  Platelets 150 - 400 K/uL 311 -    CMP Latest Ref Rng 12/25/2014 03/28/2009  Glucose 70 - 99 mg/dL 742(V125(H) 956(L104(H)  BUN 6 - 23 mg/dL 87(F27(H) 9  Creatinine 6.430.50 - 1.10 mg/dL 3.29(J1.16(H) 0.5  Sodium 188135 - 145 mmol/L 138 139  Potassium 3.5 - 5.1 mmol/L 4.3 3.7  Chloride 96 - 112 mmol/L 104 108  CO2 19 - 32 mmol/L 23 -  Calcium 8.4 - 10.5 mg/dL 9.2 -     Dg Chest 2 View  12/25/2014   CLINICAL DATA:  Initial evaluation of chest pain and shortness of breath since 2 p.m. today  EXAM: CHEST  2 VIEW  COMPARISON:   07/14/2014  FINDINGS: Heart size upper normal and stable. Vascular pattern normal. Lungs clear. No effusion.  IMPRESSION: No active cardiopulmonary disease.   Electronically Signed   By: Esperanza Heiraymond  Rubner M.D.   On: 12/25/2014 17:37     Assessment/Plan:  Dyspnea >> likely from obesity with deconditioning, diastolic heart failure. Plan: - encouraged her to maintain regular exercise regimen and adjust her diet - discussed how her weight is contributing to her symptoms  Obstructive sleep apnea. I have reviewed the recent sleep study results with the patient.  We discussed how sleep apnea can affect various health problems including risks for hypertension, cardiovascular disease, and diabetes.  We also discussed how sleep disruption can increase risks for accident, such as while driving.  Weight loss as a means of improving sleep apnea was also reviewed.  Additional treatment options discussed were CPAP therapy, oral appliance, and surgical intervention. Plan: - will arrange for auto CPAP set up   Coralyn HellingVineet Jeanet Lupe, MD [facility] Pager:  [phone]

## 2015-01-01 NOTE — Progress Notes (Signed)
PFT done today. 

## 2015-01-01 NOTE — Patient Instructions (Signed)
Will arrange for CPAP set up  Follow up in 2 months after CPAP set up 

## 2015-02-06 ENCOUNTER — Telehealth: Payer: Self-pay | Admitting: Pulmonary Disease

## 2015-02-06 NOTE — Telephone Encounter (Signed)
Auto CPAP 01/06/15 to 02/04/15 >> used on 27 of 30 nights with average 6 hrs and 8 min.  Average AHI is 0.7 with median CPAP 9 cm H2O and 95 th percentile CPAP 12 cm H20.  Will have my nurse inform pt inform pt that CPAP report looks very good.  No change to current set up.  Will review in more detail at her next follow up visit.

## 2015-02-12 NOTE — Telephone Encounter (Signed)
lmtcb x1 

## 2015-02-13 NOTE — Telephone Encounter (Signed)
I spoke with patient about results and she verbalized understanding and had no questions 

## 2015-07-02 LAB — LIPID PANEL
HDL: 38 mg/dL (ref 35–70)
LDL Cholesterol: 115 mg/dL
Triglycerides: 223 mg/dL — AB (ref 40–160)

## 2015-07-02 LAB — HEMOGLOBIN A1C: Hgb A1c MFr Bld: 11.4 % — AB (ref 4.0–6.0)

## 2015-07-02 LAB — BASIC METABOLIC PANEL: Creatinine: 0.9 mg/dL (ref 0.5–1.1)

## 2015-07-27 ENCOUNTER — Encounter: Payer: Self-pay | Admitting: Endocrinology

## 2015-07-27 ENCOUNTER — Ambulatory Visit (INDEPENDENT_AMBULATORY_CARE_PROVIDER_SITE_OTHER): Payer: BLUE CROSS/BLUE SHIELD | Admitting: Endocrinology

## 2015-07-27 ENCOUNTER — Other Ambulatory Visit: Payer: Self-pay | Admitting: *Deleted

## 2015-07-27 VITALS — BP 185/109 | HR 93 | Temp 98.3°F | Resp 18 | Ht 63.0 in | Wt 321.6 lb

## 2015-07-27 DIAGNOSIS — E1165 Type 2 diabetes mellitus with hyperglycemia: Secondary | ICD-10-CM

## 2015-07-27 DIAGNOSIS — E785 Hyperlipidemia, unspecified: Secondary | ICD-10-CM | POA: Diagnosis not present

## 2015-07-27 DIAGNOSIS — Z794 Long term (current) use of insulin: Secondary | ICD-10-CM

## 2015-07-27 DIAGNOSIS — I10 Essential (primary) hypertension: Secondary | ICD-10-CM | POA: Diagnosis not present

## 2015-07-27 MED ORDER — ONETOUCH ULTRA BLUE VI STRP: ORAL_STRIP | Status: DC

## 2015-07-27 MED ORDER — INSULIN ASPART 100 UNIT/ML FLEXPEN: 10.0000 [IU] | PEN_INJECTOR | Freq: Three times a day (TID) | SUBCUTANEOUS | Status: DC

## 2015-07-27 MED ORDER — CANAGLIFLOZIN 100 MG PO TABS: 100.0000 mg | ORAL_TABLET | Freq: Every day | ORAL | Status: DC

## 2015-07-27 NOTE — Progress Notes (Signed)
Patient ID: Melanie Reeves, female   DOB: 09-Mar-1951, 64 y.o.   MRN: [id number]           Reason for Appointment: Consultation for Type 2 Diabetes  Referring physician: Leodis Sias  History of Present Illness:          Date of diagnosis of type 2 diabetes mellitus:  2011       Background history:     She was tested for diabetes when she presented with numbness and tingling in her feet to the health Department. She does not know what her blood sugars were initially  She was however started on both metformin and glipizide at that time. She thinks that in early 2015 she was started on Lantus insulin also developed poor control  Her record indicates A1c levels ranging from 8.2-9.3  Between 9/15 on 6/16  However she has generally not been testing her blood sugar at home  Recent history:   She has been referred here for worsening blood sugar control  INSULIN regimen is: Lantus 45U at bedtime        She does not know how long her blood sugars have been high but her A1c was significantly higher, reading 11.4 in 9/16  She does complaining of increased thirst and urination and mild fatigue  Her  insulin dose has not been changed for some time.  Recently she was given Onglyza in addition to her regimen of insulin, metformin and glipizide. At this time also she started checking her blood sugars which she was not doing before  Current blood sugar patterns and problems identified:  She has mostly fasting hyperglycemia with blood sugars averaging 170  She rarely checks her blood sugars after meals, last night after [place] food it was nearly 400  She has not been able to exercise for various reasons  She does not follow any particular diet but does try to avoid drinks which sugar    Oral hypoglycemic drugs the patient is taking are: metformin, glipizide and Onglyza     Side effects from medications have been: none  Compliance with the medical regimen: fair Hypoglycemia:   never  Glucose monitoring:  done usually 1  times a day         Glucometer: One Touch.      Blood Glucose readings by time of day and averages from meter download:  PREMEAL Breakfast Lunch Dinner Bedtime  Overall   Glucose range:  148-221  206,395   382   Median:  170     177   Self-care: The diet that the patient has been following is:  none.     Meal times: Breakfast:11 AM Lunch:4 PM Dinner: 7 pm   Typical meal intake: Breakfast is sandwich or biscuit.  Lunches assignments.  Evening meal is meat, vegetables and pasta              Dietician visit, most recent: none               Exercise:  none because of pain and discomfort in her legs and dyspnea on exertion  Weight history: Lowest 178 lb several years ago, more recently 300-330  Wt Readings from Last 3 Encounters:  07/27/15 321 lb 9.6 oz (145.877 kg)  01/01/15 331 lb 9.6 oz (150.413 kg)  12/25/14 330 lb (149.687 kg)   Glycemic control:   Lab Results  Component Value Date   HGBA1C 11.4* 07/02/2015   Lab Results  Component Value Date  LDLCALC 115 07/02/2015   CREATININE 0.9 07/02/2015    Ma 11.7     Medication List       This list is accurate as of: 07/27/15  2:10 PM.  Always use your most recent med list.               aspirin 81 MG tablet  Take 81 mg by mouth daily.     atenolol 50 MG tablet  Commonly known as:  TENORMIN  Take 50 mg by mouth every morning.     cyclobenzaprine 10 MG tablet  Commonly known as:  FLEXERIL  Take 10 mg by mouth daily.     enalapril 10 MG tablet  Commonly known as:  VASOTEC  Take 10 mg by mouth every morning.     Fish Oil 1000 MG Caps  Take 2 capsules by mouth daily.     furosemide 40 MG tablet  Commonly known as:  LASIX  Take 40 mg by mouth every morning.     glipiZIDE 10 MG tablet  Commonly known as:  GLUCOTROL  Take 20 mg by mouth 2 (two) times daily before a meal.     LANTUS SOLOSTAR 100 UNIT/ML Solostar Pen  Generic drug:  Insulin Glargine  Inject 45  Units into the skin. Takes at night     loratadine 10 MG tablet  Commonly known as:  CLARITIN  Take 10 mg by mouth daily.     meloxicam 15 MG tablet  Commonly known as:  MOBIC  Take 15 mg by mouth daily.     metFORMIN 1000 MG tablet  Commonly known as:  GLUCOPHAGE  Take 1,000 mg by mouth 2 (two) times daily with a meal.     metFORMIN 500 MG 24 hr tablet  Commonly known as:  GLUCOPHAGE-XR  Take 500 mg by mouth 2 (two) times daily.     ONE TOUCH ULTRA TEST test strip  Generic drug:  glucose blood     ONETOUCH DELICA LANCETS 33G Misc     ONGLYZA 2.5 MG Tabs tablet  Generic drug:  saxagliptin HCl  Take 2.5 mg by mouth daily.        Allergies:  Allergies  Allergen Reactions  . Sulfa Antibiotics Other (See Comments)    Cant remember    Past Medical History  Diagnosis Date  . Diabetes mellitus   . Hypertension   . Hyperlipidemia   . Seasonal allergies   . OSA (obstructive sleep apnea) 08/16/2014    Past Surgical History  Procedure Laterality Date  . Tonsillectomy    . Cholecystectomy    . Colonoscopy with propofol N/A 11/29/2014    Procedure: COLONOSCOPY WITH PROPOFOL;  Surgeon: Willis Modena, MD;  Location: [facility] ENDOSCOPY;  Service: Endoscopy;  Laterality: N/A;    Family History  Problem Relation Age of Onset  . Heart disease Father     MI  . Cancer - Lung Father     Small cell Lung CA    Social History:  reports that she quit smoking about 21 years ago. Her smoking use included Cigarettes. She has a 40 pack-year smoking history. She does not have any smokeless tobacco history on file. She reports that she drinks alcohol. She reports that she does not use illicit drugs.    Review of Systems    Lipid history: She is not taking any medications, was prescribed lovastatin but has not taken it.  Reportedly has leg weakness from simvastatin.  Also taking fish oil  Lab Results  Component Value Date   HDL 38 07/02/2015   LDLCALC 115 07/02/2015   TRIG 223*  07/02/2015           Constitutional: no recent weight gain/loss, no complaints of unusual fatigue   Eyes: no history of blurred vision.  Most recent eye exam was 2016  ENT: no nasal congestion, difficulty swallowing, no hoarseness   Cardiovascular: no chest pain or tightness on exertion.  No recent leg swelling.  Hypertension: On Rx for the last 5 years or so, does take an ACE inhibitor  Respiratory: no cough/shortness of breath  Gastrointestinal: no constipation, diarrhea, nausea or abdominal pain  Musculoskeletal: She has persistent low Back, joint aches and takes meloxicam daily   Urological:   No frequency of urination or discomfort with urination; will get up at night 1-2x    Skin: no rash or infections  Neurological: no headaches.  Has persistent numbness for the last several years.  Occasionally  has sharp twinges of pain also  Psychiatric: no symptoms of depression  Endocrine: No unusual fatigue, cold intolerance or history of thyroid disease    Physical Examination:  BP 185/109 mmHg  Pulse 93  Temp(Src) 98.3 F (36.8 C)  Resp 18  Ht 5\' 3"  (1.6 m)  Wt 321 lb 9.6 oz (145.877 kg)  BMI 56.98 kg/m2  SpO2 95%  GENERAL:         Patient has severe generalized obesity.   HEENT:         Eye exam shows normal external appearance. Fundus exam shows no retinopathy.   Oral exam shows normal mucosa .  NECK:   There is no lymphadenopathy.  Mild acanthosis present  Thyroid is not enlarged and no nodules felt.  Carotids are normal to palpation and no bruit heard LUNGS:         Chest is symmetrical. Lungs are clear to auscultation.Marland Kitchen.   HEART:         Heart sounds:  S1 and S2 are normal. No murmur or click heard., no S3 or S4.   ABDOMEN:   There is no distention present. Liver and spleen are not palpable. No other mass or tenderness present.   NEUROLOGICAL:   Vibration sense is  markedly reduced in distal first toes. Ankle jerks are absent bilaterally.           Monofilament  sensation is decreased distally on all the toes and plantar surfaces MUSCULOSKELETAL:  There is no swelling or deformity of the peripheral joints. Spine is normal to inspection.   EXTREMITIES:     There is no edema. No skin lesions present.Marland Kitchen. SKIN:       No rash or lesions of concern.        ASSESSMENT:  Diabetes type 2, uncontrolled with morbid obesity  She probably has had diabetes for 10 years because of her neuropathy found her diagnosis.   Most likely she is insulin deficient and with her  Marked obesity is significantly insulin resistant also.   She is taking Lantus insulin with only fair control of her fasting readings and appears to have high postprandial readings which explains her high A1c of 11.4 recently. She does need aggressive management of her diabetes and also obesity. She will probably benefit from a combination of an GLP-1 and a SGLT 2 drug  However because of her significant postprandial hyperglycemia would like to add a mealtime insulin at least with her main meal for now Will hold off on a  GLP 1 drug while trying while reduce glucose toxicity  She is not monitoring her blood sugars much after meals Also has not followed a diet Unable to exercise which is making it difficult for her to lose weight  Complications: Peripheral neuropathy with sensory loss, reportedly has no retinopathy, recent urine drug normal.  HYPERLIPIDEMIA: Has persistently high triglycerides which have not been addressed  HYPERTENSION: Appears to be persistently poorly controlled  PLAN:    Start Novolog at least 10 units before supper  With starting to check her readings consistently after meals discussed blood sugar targets of at least under 180 at 2 hours  She will increase her Novolog by 2 units every 3 days if postprandial readings are above this target  Also if her blood sugars are consistently high after her other meals he will need insulin with all meals.  Start Invokana 100 mg before  breakfast and consider increasing the dose on the next visit  With this she will also reduce her Lasix to half tablet and also check her blood pressure weekly  Stop Onglyza and glipizide  Continue Lantus unchanged until new drugs are effective and consider increasing or decreasing the dose based on her response  Consultation with dietitian for meal planning  Consider adding Victoza weight loss is not sufficient  Patient Instructions  Check blood sugars on waking up .Marland Kitchen 4-5 .Marland Kitchen times a week Also check blood sugars about 2 hours after a meal and do this after different meals by rotation  Recommended blood sugar levels on waking up is 90-130 and about 2 hours after meal is 140-180 Please bring blood sugar monitor to each visit.  NOVOLOG insulin: Take 10 UNITS, 5-10 minutes before your evening meal.  After 3 days if you are sugars after her evening meal or consistently over 180 increase the dose by 2 units  INVOKANA: Start taking this before breakfast daily  Stop Onglyza and glipizide  We will refer you to see the dietitian for meal planning  Check blood pressure at least once a week.  Desired readings are about 120-130/70-80   Counseling time on subjects discussed above is over 50% of today's 60 minute visit  Guerino Caporale 07/27/2015, 2:10 PM   Note: This office note was prepared with Insurance underwriter. Any transcriptional errors that result from this process are unintentional.

## 2015-07-27 NOTE — Patient Instructions (Addendum)
Check blood sugars on waking up .Marland Kitchen. 4-5 .Marland Kitchen. times a week Also check blood sugars about 2 hours after a meal and do this after different meals by rotation  Recommended blood sugar levels on waking up is 90-130 and about 2 hours after meal is 140-180 Please bring blood sugar monitor to each visit.  NOVOLOG insulin: Take 10 UNITS, 5-10 minutes before your evening meal.  After 3 days if you are sugars after her evening meal or consistently over 180 increase the dose by 2 units  INVOKANA: Start taking this before breakfast daily  Stop Onglyza and glipizide  We will refer you to see the dietitian for meal planning  Check blood pressure at least once a week.  Desired readings are about 120-130/70-80

## 2015-08-20 ENCOUNTER — Ambulatory Visit (INDEPENDENT_AMBULATORY_CARE_PROVIDER_SITE_OTHER): Payer: BLUE CROSS/BLUE SHIELD | Admitting: Endocrinology

## 2015-08-20 ENCOUNTER — Encounter: Payer: Self-pay | Admitting: Endocrinology

## 2015-08-20 VITALS — BP 136/82 | HR 89 | Temp 98.2°F | Resp 16 | Ht 63.0 in | Wt 311.0 lb

## 2015-08-20 DIAGNOSIS — E1165 Type 2 diabetes mellitus with hyperglycemia: Secondary | ICD-10-CM | POA: Diagnosis not present

## 2015-08-20 DIAGNOSIS — Z794 Long term (current) use of insulin: Secondary | ICD-10-CM | POA: Diagnosis not present

## 2015-08-20 DIAGNOSIS — E785 Hyperlipidemia, unspecified: Secondary | ICD-10-CM | POA: Diagnosis not present

## 2015-08-20 LAB — COMPREHENSIVE METABOLIC PANEL
ALT: 42 U/L — AB (ref 0–35)
AST: 57 U/L — AB (ref 0–37)
Albumin: 4 g/dL (ref 3.5–5.2)
Alkaline Phosphatase: 96 U/L (ref 39–117)
BILIRUBIN TOTAL: 0.5 mg/dL (ref 0.2–1.2)
BUN: 19 mg/dL (ref 6–23)
CO2: 26 meq/L (ref 19–32)
CREATININE: 0.99 mg/dL (ref 0.40–1.20)
Calcium: 10.6 mg/dL — ABNORMAL HIGH (ref 8.4–10.5)
Chloride: 99 mEq/L (ref 96–112)
GFR: 60.36 mL/min (ref >60.00)
GLUCOSE: 222 mg/dL — AB (ref 70–99)
Potassium: 4.1 mEq/L (ref 3.5–5.1)
Sodium: 138 mEq/L (ref 135–145)
Total Protein: 8.2 g/dL (ref 6.0–8.3)

## 2015-08-20 MED ORDER — CANAGLIFLOZIN 300 MG PO TABS: 300.0000 mg | ORAL_TABLET | Freq: Every day | ORAL | Status: DC

## 2015-08-20 NOTE — Progress Notes (Signed)
Patient ID: Melanie Reeves, female   DOB: Jan 08, 1951, 64 y.o.   MRN: [id number]           Reason for Appointment:  for Type 2 Diabetes  Referring physician: Leodis Sias  History of Present Illness:          Date of diagnosis of type 2 diabetes mellitus:  2011       Background history:     She was tested for diabetes when she presented with numbness and tingling in her feet to the health Department. She does not know what her blood sugars were initially  She was however started on both metformin and glipizide at that time. She thinks that in early 2015 she was started on Lantus insulin also developed poor control  Her record indicates A1c levels ranging from 8.2-9.3 and higher at 11.4 in 9/16  Recent history:   INSULIN regimen is: Lantus 45U at bedtime, Novolog 16 at supper       Because of her poor control she was started on Invokana 100 mg daily on her initial visit on 07/27/15; she was told to stop her glipizide and low-dose Onglyza She was also told to start at least 10 units of Novolog at suppertime.  She increased this to 16 units She has been checking her blood sugars were only about once a day on an average  Current blood sugar patterns and problems identified:  She still has high readings and they appear to be higher compared to her last visit  She has a few readings after supper and they are also consistently high  Her monitor may have been correct dates and not clear if all of her readings are current  Usually not checking blood sugars after her first meal, blood sugar was high yesterday around 5 PM, probably after lunch  She has not been able to exercise for various reasons  She  says that she is scheduled to see a dietitian elsewhere  Oral hypoglycemic drugs the patient is taking are: metformin, glipizide and Onglyza     Side effects from medications have been: none  Compliance with the medical regimen: fair Hypoglycemia:  never  Glucose monitoring:  done  usually 1  times a day         Glucometer: One Touch.      Blood Glucose readings by time of day and median for 4 weeks from meter download:  Mean values apply above for all meters except median for One Touch  PRE-MEAL Fasting Lunch Dinner Bedtime Overall  Glucose range:  140-292    152, 303   196-303    Mean/median:  185      200     Self-care: The diet that the patient has been following is:  none.     Meal times: Breakfast:11 AM Lunch:4 PM Dinner: 7 pm   Typical meal intake: Breakfast is sandwich or biscuit.  Lunches assignments.  Evening meal is meat, vegetables and pasta               Dietician visit, most recent: none,                Exercise:  none because of pain and discomfort in her legs and dyspnea on exertion  Weight history: Lowest 178 lb several years ago, more recently 300-330  Wt Readings from Last 3 Encounters:  08/20/15 311 lb (141.069 kg)  07/27/15 321 lb 9.6 oz (145.877 kg)  01/01/15 331 lb 9.6 oz (150.413 kg)  Glycemic control:   Lab Results  Component Value Date   HGBA1C 11.4* 07/02/2015   Lab Results  Component Value Date   LDLCALC 115 07/02/2015   CREATININE 0.9 07/02/2015    Ma 11.7     Medication List       This list is accurate as of: 08/20/15  3:49 PM.  Always use your most recent med list.               aspirin 81 MG tablet  Take 81 mg by mouth daily.     atenolol 50 MG tablet  Commonly known as:  TENORMIN  Take 50 mg by mouth every morning.     canagliflozin 300 MG Tabs tablet  Commonly known as:  INVOKANA  Take 300 mg by mouth daily before breakfast.     cyclobenzaprine 10 MG tablet  Commonly known as:  FLEXERIL  Take 10 mg by mouth daily.     enalapril 20 MG tablet  Commonly known as:  VASOTEC     Fish Oil 1000 MG Caps  Take 2 capsules by mouth daily.     furosemide 40 MG tablet  Commonly known as:  LASIX  Take 40 mg by mouth every morning.     insulin aspart 100 UNIT/ML FlexPen  Commonly known as:  NOVOLOG  FLEXPEN  Inject 10 Units into the skin 3 (three) times daily with meals.     LANTUS SOLOSTAR 100 UNIT/ML Solostar Pen  Generic drug:  Insulin Glargine  Inject 45 Units into the skin. Takes at night     loratadine 10 MG tablet  Commonly known as:  CLARITIN  Take 10 mg by mouth daily.     meloxicam 15 MG tablet  Commonly known as:  MOBIC  Take 15 mg by mouth daily.     metFORMIN 1000 MG tablet  Commonly known as:  GLUCOPHAGE  Take 1,000 mg by mouth 2 (two) times daily with a meal.     ONE TOUCH ULTRA TEST test strip  Generic drug:  glucose blood  Test 4 times per day dx code E11.65     [facility] 33[facility] Misc     Vitamin A & D 5000-400 UNITS Caps  Take by mouth.        Allergies:  Allergies  Allergen Reactions  . Sulfa Antibiotics Other (See Comments)    Cant remember    Past Medical History  Diagnosis Date  . Diabetes mellitus (HCC)   . Hypertension   . Hyperlipidemia   . Seasonal allergies   . OSA (obstructive sleep apnea) 08/16/2014    Past Surgical History  Procedure Laterality Date  . Tonsillectomy    . Cholecystectomy    . Colonoscopy with propofol N/A 11/29/2014    Procedure: COLONOSCOPY WITH PROPOFOL;  Surgeon: Willis ModenaWilliam Outlaw, MD;  Location: [facility];  Service: Endoscopy;  Laterality: N/A;    Family History  Problem Relation Age of Onset  . Heart disease Father     MI  . Cancer - Lung Father     Small cell Lung CA    Social History:  reports that she quit smoking about 21 years ago. Her smoking use included Cigarettes. She has a 40 pack-year smoking history. She does not have any smokeless tobacco history on file. She reports that she drinks alcohol. She reports that she does not use illicit drugs.    Review of Systems    Lipid history: She is not taking any medications, was  prescribed lovastatin but has not taken it.   Reportedly has leg weakness from simvastatin.  Also taking fish oil    Lab Results  Component Value Date    HDL 38 07/02/2015   LDLCALC 115 07/02/2015   TRIG 223* 07/02/2015         .  Most recent eye exam was 2016   Hypertension: On Rx for the last 5 years or so, does take an ACE inhibitor  Has persistent numbness for the last several years.  Occasionally  has sharp twinges of pain also  Last foot exam in 10/16 showed:  Monofilament sensation is decreased distally on all the toes and plantar surfaces  She takes meloxicam 15 mg daily for joint/back pain  Physical Examination:  BP 136/82 mmHg  Pulse 89  Temp(Src) 98.2 F (36.8 C)  Resp 16  Ht  (1.6 m)  Wt 311 lb (141.069 kg)  BMI 55.11 kg/m2  SpO2 96%       ASSESSMENT:  Diabetes type 2, uncontrolled with morbid obesity and long-standing poor control  Although she has started Invokana and her weight is down 10 pounds her blood sugars appear to be relatively higher, both fasting and late in the evening This is despite starting Novolog at suppertime and increasing the dose to 16 units She may have been benefiting somewhat from glipizide and Onglyza which were stopped Currently not able to exercise much Diet has been variable and she is planning to see a dietitian elsewhere Not monitoring blood sugars consistently 2 hours after meals and usually only in the morning before her first meal  HYPERLIPIDEMIA: Has persistently high triglycerides which may improve with weight loss and improved Samuel Bouche control  HYPERTENSION: Appears to be better controlled today, may be benefiting from [place]  PLAN:    Start Novolog before breakfast and lunch also an increase the dose at suppertime to 20 as discussed below  Increase Lantus to 55.  Switch Lantus to [place] when she finishes her supply, this appears to be covered by insurance.  Discussed the differences between this and Lantus and effect on fasting readings  Increase Invokana to 300 mg  More blood sugars after meals including breakfast and lunch as discussed  Take only half  meloxicam daily  Consider adding Victoza if blood sugars are not controlled or if she gains weight again   Patient Instructions  Check blood sugars on waking up 3-4  times a week Also check blood sugars about 2 hours after a meal and do this after different meals by rotation Recommended blood sugar levels on waking up is 90-130 and about 2 hours after meal is 130-160  Please bring your blood sugar monitor to each visit, thank you  Increase LANTUS 55 UNITS, call 1 week before refilling to use Tresiba  NOVOLOG 14 UNITS BEFORE BFST AND LUNCH AND 20 AT SUPPER  iNVOKANA 2 daily of 100 then Rx 300  Try 1/2 Meloxicam     Counseling time on subjects discussed above is over 50% of today's 25 minute visit   Antinio Sanderfer 08/20/2015, 3:49 PM   Note: This office note was prepared with Insurance underwriter. Any transcriptional errors that result from this process are unintentional.

## 2015-08-20 NOTE — Patient Instructions (Addendum)
Check blood sugars on waking up 3-4  times a week Also check blood sugars about 2 hours after a meal and do this after different meals by rotation Recommended blood sugar levels on waking up is 90-130 and about 2 hours after meal is 130-160  Please bring your blood sugar monitor to each visit, thank you  Increase LANTUS 55 UNITS, call 1 week before refilling to use Tresiba  NOVOLOG 14 UNITS BEFORE BFST AND LUNCH AND 20 AT SUPPER  iNVOKANA 2 daily of 100 then Rx 300  Try 1/2 Meloxicam

## 2015-08-21 NOTE — Progress Notes (Signed)
Quick Note:  Please let patient know that the liver tests are slightly high, may be from her meloxicam. Calcium is slightly high, need to know if she is taking any calcium and vitamin D supplements Forward the labs to PCP ______

## 2015-08-23 ENCOUNTER — Telehealth: Payer: Self-pay | Admitting: Endocrinology

## 2015-08-23 NOTE — Telephone Encounter (Signed)
What is her response? Need to know if she is taking calcium, vitamin D because of high calcium levels Have also recommended stopping meloxicam because of abnormal liver tests, needs to discuss these with PCP

## 2015-08-23 NOTE — Telephone Encounter (Signed)
FYI: per your result note, Please see below.

## 2015-08-23 NOTE — Telephone Encounter (Signed)
She is taking both of them.

## 2015-08-23 NOTE — Telephone Encounter (Signed)
Patient is returning your call let you now if she is taking calcium.

## 2015-08-23 NOTE — Telephone Encounter (Signed)
Stop calcium and vitamin D

## 2015-08-24 NOTE — Telephone Encounter (Signed)
Instructions left on patients voicemail.

## 2015-08-31 ENCOUNTER — Telehealth: Payer: Self-pay | Admitting: Endocrinology

## 2015-08-31 ENCOUNTER — Other Ambulatory Visit: Payer: Self-pay | Admitting: *Deleted

## 2015-08-31 MED ORDER — INSULIN DEGLUDEC 100 UNIT/ML ~~LOC~~ SOPN
45.0000 [IU] | PEN_INJECTOR | Freq: Every day | SUBCUTANEOUS | Status: DC
Start: 2015-08-31 — End: 2015-09-18

## 2015-08-31 NOTE — Telephone Encounter (Signed)
Patient stated that Dr Lucianne MussKumar was taking her off the lantus and putting her on some new medication, she dosen't know what it is, she ask if he would  [facility] [address] - [place], [place] - [address] [phone] (Phone) [phone] (Fax)

## 2015-08-31 NOTE — Telephone Encounter (Signed)
Rx sent for tresiba

## 2015-09-03 ENCOUNTER — Telehealth: Payer: Self-pay | Admitting: Endocrinology

## 2015-09-03 NOTE — Telephone Encounter (Signed)
I [facility]Kelly at [facility] and gave her the needed information to finish filling out the form.

## 2015-09-03 NOTE — Telephone Encounter (Signed)
[phone] [facility] [id number]  Calling regarding PA for pt's Melanie Reeves

## 2015-09-17 ENCOUNTER — Other Ambulatory Visit (INDEPENDENT_AMBULATORY_CARE_PROVIDER_SITE_OTHER): Payer: BLUE CROSS/BLUE SHIELD

## 2015-09-17 DIAGNOSIS — Z794 Long term (current) use of insulin: Secondary | ICD-10-CM

## 2015-09-17 DIAGNOSIS — E1165 Type 2 diabetes mellitus with hyperglycemia: Secondary | ICD-10-CM | POA: Diagnosis not present

## 2015-09-17 LAB — LIPID PANEL
CHOL/HDL RATIO: 5
CHOLESTEROL: 175 mg/dL (ref 0–200)
HDL: 32.2 mg/dL — ABNORMAL LOW (ref >39.00)
LDL Cholesterol: 104 mg/dL — ABNORMAL HIGH (ref 0–99)
NonHDL: 142.39
TRIGLYCERIDES: 190 mg/dL — AB (ref 0.0–149.0)
VLDL: 38 mg/dL (ref 0.0–40.0)

## 2015-09-17 LAB — COMPREHENSIVE METABOLIC PANEL
ALBUMIN: 3.8 g/dL (ref 3.5–5.2)
ALT: 32 U/L (ref 0–35)
AST: 34 U/L (ref 0–37)
Alkaline Phosphatase: 74 U/L (ref 39–117)
BILIRUBIN TOTAL: 0.3 mg/dL (ref 0.2–1.2)
BUN: 26 mg/dL — ABNORMAL HIGH (ref 6–23)
CALCIUM: 9.2 mg/dL (ref 8.4–10.5)
CO2: 19 meq/L (ref 19–32)
Chloride: 104 mEq/L (ref 96–112)
Creatinine, Ser: 0.96 mg/dL (ref 0.40–1.20)
GFR: 62.52 mL/min (ref >60.00)
Glucose, Bld: 175 mg/dL — ABNORMAL HIGH (ref 70–99)
Potassium: 4 mEq/L (ref 3.5–5.1)
Sodium: 135 mEq/L (ref 135–145)
Total Protein: 7.8 g/dL (ref 6.0–8.3)

## 2015-09-17 LAB — HEMOGLOBIN A1C: Hgb A1c MFr Bld: 9.1 % — ABNORMAL HIGH (ref 4.6–6.5)

## 2015-09-18 ENCOUNTER — Ambulatory Visit (INDEPENDENT_AMBULATORY_CARE_PROVIDER_SITE_OTHER): Payer: BLUE CROSS/BLUE SHIELD | Admitting: Endocrinology

## 2015-09-18 ENCOUNTER — Other Ambulatory Visit: Payer: Self-pay | Admitting: *Deleted

## 2015-09-18 ENCOUNTER — Encounter: Payer: Self-pay | Admitting: Endocrinology

## 2015-09-18 VITALS — BP 135/84 | HR 92 | Temp 98.4°F | Resp 16 | Ht 63.0 in | Wt 310.8 lb

## 2015-09-18 DIAGNOSIS — Z794 Long term (current) use of insulin: Secondary | ICD-10-CM

## 2015-09-18 DIAGNOSIS — E1165 Type 2 diabetes mellitus with hyperglycemia: Secondary | ICD-10-CM | POA: Diagnosis not present

## 2015-09-18 DIAGNOSIS — E785 Hyperlipidemia, unspecified: Secondary | ICD-10-CM | POA: Diagnosis not present

## 2015-09-18 MED ORDER — LANTUS SOLOSTAR 100 UNIT/ML ~~LOC~~ SOPN
PEN_INJECTOR | SUBCUTANEOUS | Status: DC
Start: 2015-09-18 — End: 2016-01-28

## 2015-09-18 MED ORDER — VICTOZA 18 MG/3ML ~~LOC~~ SOPN: 1.2000 mg | PEN_INJECTOR | Freq: Every day | SUBCUTANEOUS | Status: DC

## 2015-09-18 NOTE — Patient Instructions (Signed)
Start VICTOZA injection as shown once daily at the same time of the day.  Dial the dose to 0.6 mg on the pen for the first week.  You may inject in the stomach, thigh or arm. You may experience nausea in the first few days which usually goes away.  You will feel fullness of the stomach with starting the medication and should try to keep the portions at meals small. After 1 week increase the dose to 1.2mg  daily if no nausea present.   If any questions or concerns are present call the office or the [facility] helpline at [phone].  Visit [email or web address] for more useful information  Avoid night snacks, less fats  With 1.2mg  Victoza reduce Novolog by 5 units  Lantus 55, after 1 week if am sugar still >140 go to 60 Lantus  Check blood sugars on waking up 3-4  times a week Also check blood sugars about 2 hours after a meal and do this after different meals by rotation  Recommended blood sugar levels on waking up is 90-130 and about 2 hours after meal is 130-160  Please bring your blood sugar monitor to each visit, thank you

## 2015-09-18 NOTE — Progress Notes (Signed)
Patient ID: Melanie Reeves, female   DOB: Feb 10, 1951, 64 y.o.   MRN: [id number]           Reason for Appointment: f/u  for Type 2 Diabetes  Referring physician: Leodis Sias  History of Present Illness:          Date of diagnosis of type 2 diabetes mellitus:  2011       Background history:     She was tested for diabetes when she presented with numbness and tingling in her feet to the health Department. She does not know what her blood sugars were initially  She was however started on both metformin and glipizide at that time. She thinks that in early 2015 she was started on Lantus insulin also developed poor control  Her record indicates A1c levels ranging from 8.2-9.3 and higher at 11.4 in 9/16  Recent history:   INSULIN regimen is: Lantus 55U at bedtime, Novolog 15--15-20 at supper       Because of her poor control she was started on Invokana 100 mg daily on her initial visit on 07/27/15; she was told to stop her glipizide and low-dose Onglyza.  Since 11/16 she is taking 300 mg Invokana without side effects  She was also told to start 15 units of Novolog with lunch and breakfast and also increase her supper dose to 20 units She has been checking her blood sugars were only about once a day on an averageand thus after meals recently  Current blood sugar patterns and problems identified:  She still has high readings in the mornings despite increasing her Lantus by 10 units and not very high readings late at night  However she thinks she is getting some snacks during the night including peanuts  She says she is irregular with taking her Novolog with meals but is doing better with her suppertime dose  In the last week she thinks she is watching her portions and reducing drinks with sugar and high carbohydrate meals; she does have a couple of relatively good readings after evening meal  She has not check readings after lunch, not clear if her reading of 311 in the morning was  after breakfast  She has not been able to exercise for various reasons  Oral hypoglycemic drugs the patient is taking are: metformin ER, 2000mg , Invokana 300 mg daily Side effects from medications have been: none  Compliance with the medical regimen: fair Hypoglycemia:  never  Glucose monitoring:  done usually 1  times a day         Glucometer: One Touch.      Blood Glucose readings by time of day and median for 4 weeks from meter download:  Mean values apply above for all meters except median for One Touch  PRE-MEAL Fasting Lunch Dinner Bedtime Overall  Glucose range: 174-311  173, 204 148-245   Mean/median: 216    208   POST-MEAL PC Breakfast PC Lunch PC Dinner  Glucose range: 246  153-313  Mean/median:      Self-care: The diet that the patient has been following is: recently restricting carbohydrates and some fats.     Meal times: Breakfast:10-11 AM Lunch:4 PM Dinner: 7 pm   Typical meal intake: Breakfast is sandwich or biscuit.  Lunches assignments.  Evening meal is meat, vegetables and pasta               Dietician visit, most recent: none  Exercise:  minimal because of pain and discomfort in her legs and dyspnea on exertion  Weight history: Lowest 178 lb several years ago, more recently 300-330  Wt Readings from Last 3 Encounters:  09/18/15 310 lb 12.8 oz (140.978 kg)  08/20/15 311 lb (141.069 kg)  07/27/15 321 lb 9.6 oz (145.877 kg)   Glycemic control:   Lab Results  Component Value Date   HGBA1C 9.1* 09/17/2015   HGBA1C 11.4* 07/02/2015   Lab Results  Component Value Date   LDLCALC 104* 09/17/2015   CREATININE 0.96 09/17/2015    Ma 11.7     Medication List       This list is accurate as of: 09/18/15 11:45 AM.  Always use your most recent med list.               aspirin 81 MG tablet  Take 81 mg by mouth daily.     atenolol 50 MG tablet  Commonly known as:  TENORMIN  Take 50 mg by mouth every morning.     canagliflozin 300 MG  Tabs tablet  Commonly known as:  INVOKANA  Take 300 mg by mouth daily before breakfast.     cyclobenzaprine 10 MG tablet  Commonly known as:  FLEXERIL  Take 10 mg by mouth daily.     enalapril 20 MG tablet  Commonly known as:  VASOTEC  Take 30 mg by mouth daily.     Fish Oil 1000 MG Caps  Take 2 capsules by mouth daily.     furosemide 40 MG tablet  Commonly known as:  LASIX  Take 40 mg by mouth every morning. Takes 1/2 tablet daily     insulin aspart 100 UNIT/ML FlexPen  Commonly known as:  NOVOLOG FLEXPEN  Inject 10 Units into the skin 3 (three) times daily with meals.     LANTUS SOLOSTAR 100 UNIT/ML Solostar Pen  Generic drug:  Insulin Glargine  Inject 60 units daily     loratadine 10 MG tablet  Commonly known as:  CLARITIN  Take 10 mg by mouth daily.     meloxicam 7.5 MG tablet  Commonly known as:  MOBIC     metFORMIN 1000 MG tablet  Commonly known as:  GLUCOPHAGE  Take 1,000 mg by mouth 2 (two) times daily with a meal.     ONE TOUCH ULTRA TEST test strip  Generic drug:  glucose blood  Test 4 times per day dx code E11.65     ONETOUCH DELICA LANCETS 33G Misc     VICTOZA 18 MG/3ML Sopn  Generic drug:  Liraglutide  Inject 0.2 mLs (1.2 mg total) into the skin daily. Inject once daily at the same time     Vitamin A & D 5000-400 UNITS Caps  Take by mouth.        Allergies:  Allergies  Allergen Reactions  . Sulfa Antibiotics Other (See Comments)    Cant remember    Past Medical History  Diagnosis Date  . Diabetes mellitus (HCC)   . Hypertension   . Hyperlipidemia   . Seasonal allergies   . OSA (obstructive sleep apnea) 08/16/2014    Past Surgical History  Procedure Laterality Date  . Tonsillectomy    . Cholecystectomy    . Colonoscopy with propofol N/A 11/29/2014    Procedure: COLONOSCOPY WITH PROPOFOL;  Surgeon: Willis Modena, MD;  Location: [facility];  Service: Endoscopy;  Laterality: N/A;    Family History  Problem Relation Age of Onset    .  Heart disease Father     MI  . Cancer - Lung Father     Small cell Lung CA    Social History:  reports that she quit smoking about 21 years ago. Her smoking use included Cigarettes. She has a 40 pack-year smoking history. She does not have any smokeless tobacco history on file. She reports that she drinks alcohol. She reports that she does not use illicit drugs.    Review of Systems    Lipid history: She  was recommended lovastatin and her LDL is near target, triglycerides are relatively better  Reportedly has leg weakness from simvastatin.  Also taking fish oil    Lab Results  Component Value Date   CHOL 175 09/17/2015   HDL 32.20* 09/17/2015   LDLCALC 104* 09/17/2015   TRIG 190.0* 09/17/2015   CHOLHDL 5 09/17/2015         .  Most recent eye exam was 2016   Hypertension: On treatment from PCP for the last 5 years or so; currently taking atenolol and her PCP increased her enalapril to 20 mg  Has persistent numbness for the last several years.  Occasionally  has sharp twinges of pain also  Last foot exam in 10/16 showed:  Monofilament sensation is decreased distally on all the toes and plantar surfaces  She takes meloxicam 15 mg daily for joint/back painand has not reduced the dose as yet as recommended  HYPERCALCEMIA: This is better with stopping her calcium and vitamin D supplements  Liver functions are better also, not clear why they were high  Physical Examination:  BP 135/84 mmHg  Pulse 92  Temp(Src) 98.4 F (36.9 C)  Resp 16  Ht 5\' 3"  (1.6 m)  Wt 310 lb 12.8 oz (140.978 kg)  BMI 55.07 kg/m2  SpO2 95%       ASSESSMENT:  Diabetes type 2, uncontrolled with morbid obesity and long-standing poor control See history of present illness for detailed discussion of his current management, blood sugar patterns and problems identified As discussed above she needs to have better control of overnight blood sugars Also difficult to assess her postprandial readings  which she is not monitoring as directed She still has considerable difficulty losing weight despite starting Invokana  Her A1c is improving to around 9%, previously 11%  She is a good candidate for a GLP-1 drug which may help her control and obesity Discussed with the patient the nature of GLP-1 drugs, the actions on various organ systems, how they benefit blood glucose control, as well as the benefit of weight loss and  increase satiety . Explained possible side effects especially nausea and vomiting initially; discussed safety information in package insert.  Described the injection technique and dosage titration of Victoza  starting with 0.6 mg once a day at the same time for the first week and then increasing to 1.2 mg if no symptoms of nausea.  Educational brochure on Victoza and co-pay card given  HYPERLIPIDEMIA: Has somewhat better triglycerides now  HYPERTENSION: Appears to be better controlled today  PLAN:    Start VICTOZA as above.   Given instructions in detail for adjusting Novolog and Lantus especially if blood sugars improve with starting Victoza  Follow-up in 3 weeks  More postprandial readings  Better diet and avoid high fat snacks like peanuts  Encourage her to continue increasing her activity level  Consider stopping Invokana if she is continuing on Victoza  Continue maximum dose metformin  Although she may benefit from switching  his Lantus to [facility] she wants to continue this as it is completely covered and has difficulty affording co-pays   Patient Instructions  Start VICTOZA injection as shown once daily at the same time of the day.  Dial the dose to 0.6 mg on the pen for the first week.  You may inject in the stomach, thigh or arm. You may experience nausea in the first few days which usually goes away.  You will feel fullness of the stomach with starting the medication and should try to keep the portions at meals small. After 1 week increase the dose to 1.2mg   daily if no nausea present.   If any questions or concerns are present call the office or the [facility] helpline at [phone].  Visit [email or web address] for more useful information  Avoid night snacks, less fats  With 1.2mg  Victoza reduce Novolog by 5 units  Lantus 55, after 1 week if am sugar still >140 go to 60 Lantus  Check blood sugars on waking up 3-4  times a week Also check blood sugars about 2 hours after a meal and do this after different meals by rotation  Recommended blood sugar levels on waking up is 90-130 and about 2 hours after meal is 130-160  Please bring your blood sugar monitor to each visit, thank you        Counseling time on subjects discussed above is over 50% of today's 25 minute visit   Reco Shonk 09/18/2015, 11:45 AM   Note: This office note was prepared with Insurance underwriter. Any transcriptional errors that result from this process are unintentional.

## 2015-09-19 ENCOUNTER — Telehealth: Payer: Self-pay | Admitting: Endocrinology

## 2015-09-19 NOTE — Telephone Encounter (Signed)
For now, yes

## 2015-09-19 NOTE — Telephone Encounter (Signed)
Please see below and advise.

## 2015-09-19 NOTE — Telephone Encounter (Signed)
Patient called stating that she would like to know if she is to continue the 300 mg Invokana    Please advise patient    Thank you

## 2015-09-19 NOTE — Telephone Encounter (Signed)
I tried calling her back twice but she kept hanging up, not sure if she was having problems with her phone?

## 2015-10-10 ENCOUNTER — Encounter: Payer: Self-pay | Admitting: Endocrinology

## 2015-10-10 ENCOUNTER — Ambulatory Visit (INDEPENDENT_AMBULATORY_CARE_PROVIDER_SITE_OTHER): Payer: BLUE CROSS/BLUE SHIELD | Admitting: Endocrinology

## 2015-10-10 VITALS — BP 130/84 | HR 81 | Resp 16 | Ht 63.0 in | Wt 297.0 lb

## 2015-10-10 DIAGNOSIS — Z794 Long term (current) use of insulin: Secondary | ICD-10-CM

## 2015-10-10 DIAGNOSIS — E1165 Type 2 diabetes mellitus with hyperglycemia: Secondary | ICD-10-CM

## 2015-10-10 NOTE — Patient Instructions (Signed)
Lantus 57 units at nite  Check blood sugars on waking up 3-4 times a week Also check blood sugars about 2 hours after a meal and do this after different meals by rotation  Recommended blood sugar levels on waking up is 90-130 and about 2 hours after meal is 130-160  Please bring your blood sugar monitor to each visit, thank you

## 2015-10-10 NOTE — Progress Notes (Signed)
Patient ID: Melanie Reeves, female   DOB: 10/15/1950, 64 y.o.   MRN: [id number]           Reason for Appointment: f/u  for Type 2 Diabetes  Referring physician: Leodis SiasFrancis Wong  History of Present Illness:          Date of diagnosis of type 2 diabetes mellitus:  2011       Background history:     She was tested for diabetes when she presented with numbness and tingling in her feet to the health Department. She does not know what her blood sugars were initially  She was however started on both metformin and glipizide at that time. She thinks that in early 2015 she was started on Lantus insulin also developed poor control  Her record indicates A1c levels ranging from 8.2-9.3 and higher at 11.4 in 9/16  Recent history:   INSULIN regimen is: Lantus 55U at bedtime, Novolog 12--12-15 at supper       Because of her poor control she was started on Invokana 100 mg daily on her initial visit on 07/27/15; she was told to stop her glipizide and low-dose Onglyza.  Since 11/16 she is taking 300 mg Invokana without side effects  She was also told to start Victoza on her last visit in early December because of continued hyperglycemia and difficulty controlling weight and portions She has been continued on her basal bolus insulin regimen also  Current blood sugar patterns and problems identified:  She appears to be having progressive improvement in her blood sugars since starting Victoza which she has tolerated to 1.2 mg without nausea  She does find that this helps her control portions significantly  Has lost 12 pounds  She has done only a few readings lately and mostly morning or midday  Although fasting readings are mostly high they are more consistently better, previously averaging 216  Does not have many readings after meals but usually not higher after supper  With using the Victoza she has been able to cut back on her mealtime doses 3-5 units  She is also cutting back on carbohydrate  now and appears more motivated  She has not been able to exercise for various reasons, now having more back pain  Oral hypoglycemic drugs the patient is taking are: metformin ER, 2000mg , Invokana 300 mg daily Side effects from medications have been: none  Compliance with the medical regimen: fair Hypoglycemia:  never  Glucose monitoring:  done usually 1  times a day         Glucometer: One Touch.      Blood Glucose readings by time of day and median for 4 weeks from meter download:  Mean values apply above for all meters except median for One Touch  PRE-MEAL Fasting Lunch Dinner Bedtime Overall  Glucose range: 145-218   138-166    122-172    Mean/median:      169+/-28    Self-care: The diet that the patient has been following is: recently restricting carbohydrates and some fats.     Meal times: Breakfast:10-11 AM Lunch:4 PM Dinner: 7 pm   Typical meal intake: Breakfast is sandwich or biscuit.  Lunch.  Evening meal is meat, vegetables and pasta               Dietician visit, most recent: none                Exercise:  minimal because of pain and discomfort in her legs  and dyspnea on exertion  Weight history: Lowest 178 lb several years ago, more recently 300-330  Wt Readings from Last 3 Encounters:  10/10/15 297 lb (134.718 kg)  09/18/15 310 lb 12.8 oz (140.978 kg)  08/20/15 311 lb (141.069 kg)   Glycemic control:   Lab Results  Component Value Date   HGBA1C 9.1* 09/17/2015   HGBA1C 11.4* 07/02/2015   Lab Results  Component Value Date   LDLCALC 104* 09/17/2015   CREATININE 0.96 09/17/2015    microalbumin/creatinine ratio 11.7     Medication List       This list is accurate as of: 10/10/15  5:13 PM.  Always use your most recent med list.               aspirin 81 MG tablet  Take 81 mg by mouth daily.     atenolol 50 MG tablet  Commonly known as:  TENORMIN  Take 50 mg by mouth every morning.     canagliflozin 300 MG Tabs tablet  Commonly known as:   INVOKANA  Take 300 mg by mouth daily before breakfast.     cyclobenzaprine 10 MG tablet  Commonly known as:  FLEXERIL  Take 10 mg by mouth daily.     enalapril 20 MG tablet  Commonly known as:  VASOTEC  Take 30 mg by mouth daily.     Fish Oil 1000 MG Caps  Take 2 capsules by mouth daily.     furosemide 40 MG tablet  Commonly known as:  LASIX  Take 40 mg by mouth every morning. Takes 1/2 tablet daily     HYDROcodone-acetaminophen 5-325 MG tablet  Commonly known as:  NORCO/VICODIN     insulin aspart 100 UNIT/ML FlexPen  Commonly known as:  NOVOLOG FLEXPEN  Inject 10 Units into the skin 3 (three) times daily with meals.     LANTUS SOLOSTAR 100 UNIT/ML Solostar Pen  Generic drug:  Insulin Glargine  Inject 60 units daily     loratadine 10 MG tablet  Commonly known as:  CLARITIN  Take 10 mg by mouth daily.     meloxicam 7.5 MG tablet  Commonly known as:  MOBIC     metFORMIN 1000 MG tablet  Commonly known as:  GLUCOPHAGE  Take 1,000 mg by mouth 2 (two) times daily with a meal.     ONE TOUCH ULTRA TEST test strip  Generic drug:  glucose blood  Test 4 times per day dx code E11.65     ONETOUCH DELICA LANCETS 33G Misc     VICTOZA 18 MG/3ML Sopn  Generic drug:  Liraglutide  Inject 0.2 mLs (1.2 mg total) into the skin daily. Inject once daily at the same time     Vitamin A & D 5000-400 units Caps  Take by mouth.        Allergies:  Allergies  Allergen Reactions  . Sulfa Antibiotics Other (See Comments)    Cant remember    Past Medical History  Diagnosis Date  . Diabetes mellitus (HCC)   . Hypertension   . Hyperlipidemia   . Seasonal allergies   . OSA (obstructive sleep apnea) 08/16/2014    Past Surgical History  Procedure Laterality Date  . Tonsillectomy    . Cholecystectomy    . Colonoscopy with propofol N/A 11/29/2014    Procedure: COLONOSCOPY WITH PROPOFOL;  Surgeon: Willis Modena, MD;  Location: [facility];  Service: Endoscopy;  Laterality: N/A;     Family History  Problem Relation Age  of Onset  . Heart disease Father     MI  . Cancer - Lung Father     Small cell Lung CA    Social History:  reports that she quit smoking about 21 years ago. Her smoking use included Cigarettes. She has a 40 pack-year smoking history. She does not have any smokeless tobacco history on file. She reports that she drinks alcohol. She reports that she does not use illicit drugs.    Review of Systems    Lipid history: She  was recommended lovastatin and her LDL is near target, triglycerides are below 200 Reportedly has leg weakness from simvastatin.  Also taking fish oil    Lab Results  Component Value Date   CHOL 175 09/17/2015   HDL 32.20* 09/17/2015   LDLCALC 104* 09/17/2015   TRIG 190.0* 09/17/2015   CHOLHDL 5 09/17/2015         .  Most recent eye exam was 2016   Hypertension:  On treatment from PCP for the last 5 years or so; currently taking atenolol and 20 mg enalapril  Has persistent numbness for the last several years.  Occasionally  has sharp twinges of pain also  Last foot exam in 10/16 showed:  Monofilament sensation is decreased distally on all the toes and plantar surfaces  She takes meloxicam 15 mg daily for joint/back pain  HYPERCALCEMIA: This is better with stopping her calcium and vitamin D supplements   Physical Examination:  BP 130/84 mmHg  Pulse 81  Resp 16  Ht  (1.6 m)  Wt 297 lb (134.718 kg)  BMI 52.62 kg/m2  SpO2 98%       ASSESSMENT:  Diabetes type 2, uncontrolled with morbid obesity and long-standing poor control See history of present illness for detailed discussion of his current management, blood sugar patterns and problems identified As discussed above she has significant improvement in her blood sugars with starting Victoza about 3 weeks ago  She has been able to cut back on her mealtime insulin coverage but her fasting readings are still mildly high His losing weight and is doing  better with diet overall  PLAN:    Continue VICTOZA 1.2 mg as above.   Increase Toujeo by 2 units for now and keep morning sugars under 130  Continue Invokana  Discussed blood sugar targets at various times and need to monitor more consistently after meals   Patient Instructions  Lantus 57 units at nite  Check blood sugars on waking up 3-4 times a week Also check blood sugars about 2 hours after a meal and do this after different meals by rotation  Recommended blood sugar levels on waking up is 90-130 and about 2 hours after meal is 130-160  Please bring your blood sugar monitor to each visit, thank you      [facility] 10/10/2015, 5:13 PM   Note: This office note was prepared with Dragon voice recognition system technology. Any transcriptional errors that result from this process are unintentional.

## 2015-10-12 ENCOUNTER — Telehealth: Payer: Self-pay | Admitting: Endocrinology

## 2015-10-12 ENCOUNTER — Other Ambulatory Visit: Payer: Self-pay | Admitting: *Deleted

## 2015-10-12 MED ORDER — GABAPENTIN 300 MG PO CAPS: 300.0000 mg | ORAL_CAPSULE | Freq: Three times a day (TID) | ORAL | Status: DC

## 2015-10-12 MED ORDER — VICTOZA 18 MG/3ML ~~LOC~~ SOPN: 1.2000 mg | PEN_INJECTOR | Freq: Every day | SUBCUTANEOUS | Status: DC

## 2015-10-12 NOTE — Telephone Encounter (Signed)
Please see below and advise.

## 2015-10-12 NOTE — Telephone Encounter (Signed)
Gabapentin 300 mg up to 3 times a day with food

## 2015-10-12 NOTE — Telephone Encounter (Signed)
Patient called stating that she would like something called in for her neuropathy   [facility]  [address]   Thank you

## 2015-10-12 NOTE — Telephone Encounter (Signed)
Noted, rx sent.  Patient is aware 

## 2015-10-25 ENCOUNTER — Telehealth: Payer: Self-pay | Admitting: Endocrinology

## 2015-10-25 MED ORDER — ONETOUCH ULTRA BLUE VI STRP
ORAL_STRIP | Status: DC
Start: 2015-10-25 — End: 2015-10-26

## 2015-10-25 NOTE — Telephone Encounter (Signed)
Patient Name: Melanie Reeves Gender: Female DOB: 06/06/1951 Age: [age] Y 5 M 10 D Return Phone Number: [phone] (Primary) Address: [place]: [place] [id number] Client [facility] - Client Client Site [facility] Physician Reather LittlerKumar, Ajay Contact Type Call Call Type Triage / Clinical Relationship To Patient Self Return Phone Number [phone] (Primary) Chief Complaint Foot Pain Initial Comment Caller states she needs a prescription for some needles. She is taking a medication and she had her feet on a heating pad and they made her feet go crazy and she has neuropothy in her feet.

## 2015-10-25 NOTE — Telephone Encounter (Signed)
I contacted the pt and advised of note below. Requested a call back if the pt would like to discuss.  

## 2015-10-25 NOTE — Telephone Encounter (Signed)
Patient called stating she would like refills sent to her pharmacy  Rx: Contour Next test strips  Pen Needles   Also, she would like to know if she can take 2 tabs of gabapentin at night to relieve foot discomfort   Pharmacy: [facility] [place]  Thank you

## 2015-10-25 NOTE — Telephone Encounter (Signed)
Rx was send for the test strips  Please advise. Pt would like to know if she can take 2 tabs of gabapentin at night to relieve foot discomfort

## 2015-10-25 NOTE — Telephone Encounter (Signed)
She can take 2 gabapentin at night for her feet hurting

## 2015-10-25 NOTE — Telephone Encounter (Signed)
Left a message to pt to return my call

## 2015-10-26 ENCOUNTER — Other Ambulatory Visit: Payer: Self-pay | Admitting: *Deleted

## 2015-10-26 MED ORDER — BAYER CONTOUR NEXT LINK W/DEVICE KIT: PACK | Status: DC

## 2015-10-26 MED ORDER — GLUCOSE BLOOD VI STRP: ORAL_STRIP | Status: DC

## 2015-10-26 MED ORDER — BAYER MICROLET LANCETS MISC: Status: DC

## 2015-10-26 NOTE — Telephone Encounter (Signed)
Pt's ins plan does not cover One Touch Ultra. Sent [employer] Contour Next meter, strips and lancets

## 2015-11-01 ENCOUNTER — Encounter (HOSPITAL_BASED_OUTPATIENT_CLINIC_OR_DEPARTMENT_OTHER): Payer: Self-pay | Admitting: Emergency Medicine

## 2015-11-01 ENCOUNTER — Emergency Department (HOSPITAL_BASED_OUTPATIENT_CLINIC_OR_DEPARTMENT_OTHER)
Admission: EM | Admit: 2015-11-01 | Discharge: 2015-11-02 | Disposition: A | Discharge status: Home / Self Care | Payer: BLUE CROSS/BLUE SHIELD | Attending: Emergency Medicine | Admitting: Emergency Medicine

## 2015-11-01 DIAGNOSIS — Z8669 Personal history of other diseases of the nervous system and sense organs: Secondary | ICD-10-CM | POA: Insufficient documentation

## 2015-11-01 DIAGNOSIS — Z87891 Personal history of nicotine dependence: Secondary | ICD-10-CM | POA: Insufficient documentation

## 2015-11-01 DIAGNOSIS — Z791 Long term (current) use of non-steroidal anti-inflammatories (NSAID): Secondary | ICD-10-CM | POA: Diagnosis not present

## 2015-11-01 DIAGNOSIS — R7989 Other specified abnormal findings of blood chemistry: Secondary | ICD-10-CM | POA: Diagnosis present

## 2015-11-01 DIAGNOSIS — E119 Type 2 diabetes mellitus without complications: Secondary | ICD-10-CM | POA: Insufficient documentation

## 2015-11-01 DIAGNOSIS — Z7982 Long term (current) use of aspirin: Secondary | ICD-10-CM | POA: Insufficient documentation

## 2015-11-01 DIAGNOSIS — E669 Obesity, unspecified: Secondary | ICD-10-CM | POA: Insufficient documentation

## 2015-11-01 DIAGNOSIS — Z79899 Other long term (current) drug therapy: Secondary | ICD-10-CM | POA: Diagnosis not present

## 2015-11-01 DIAGNOSIS — N390 Urinary tract infection, site not specified: Secondary | ICD-10-CM | POA: Insufficient documentation

## 2015-11-01 DIAGNOSIS — Z7984 Long term (current) use of oral hypoglycemic drugs: Secondary | ICD-10-CM | POA: Diagnosis not present

## 2015-11-01 DIAGNOSIS — N179 Acute kidney failure, unspecified: Secondary | ICD-10-CM | POA: Diagnosis not present

## 2015-11-01 DIAGNOSIS — I10 Essential (primary) hypertension: Secondary | ICD-10-CM | POA: Diagnosis not present

## 2015-11-01 DIAGNOSIS — E785 Hyperlipidemia, unspecified: Secondary | ICD-10-CM | POA: Insufficient documentation

## 2015-11-01 HISTORY — DX: Morbid (severe) obesity due to excess calories: E66.01

## 2015-11-01 LAB — COMPREHENSIVE METABOLIC PANEL
ALT: 33 U/L (ref 14–54)
AST: 27 U/L (ref 15–41)
Albumin: 3.8 g/dL (ref 3.5–5.0)
Alkaline Phosphatase: 69 U/L (ref 38–126)
Anion gap: 11 (ref 5–15)
BUN: 96 mg/dL — ABNORMAL HIGH (ref 6–20)
CO2: 18 mmol/L — AB (ref 22–32)
CREATININE: 1.8 mg/dL — AB (ref 0.44–1.00)
Calcium: 9.3 mg/dL (ref 8.9–10.3)
Chloride: 111 mmol/L (ref 101–111)
GFR, EST AFRICAN AMERICAN: 34 mL/min — AB (ref >60)
GFR, EST NON AFRICAN AMERICAN: 29 mL/min — AB (ref >60)
Glucose, Bld: 194 mg/dL — ABNORMAL HIGH (ref 65–99)
Potassium: 5.1 mmol/L (ref 3.5–5.1)
Sodium: 140 mmol/L (ref 135–145)
Total Bilirubin: 0.2 mg/dL — ABNORMAL LOW (ref 0.3–1.2)
Total Protein: 7 g/dL (ref 6.5–8.1)

## 2015-11-01 MED ORDER — SODIUM CHLORIDE 0.9 % IV BOLUS (SEPSIS)
1000.0000 mL | Freq: Once | INTRAVENOUS | Status: AC
  Administered 2015-11-01: 1000 mL via INTRAVENOUS

## 2015-11-01 MED ORDER — SODIUM CHLORIDE 0.9 % IV BOLUS (SEPSIS)
1000.0000 mL | Freq: Once | INTRAVENOUS | Status: AC
  Administered 2015-11-02: 1000 mL via INTRAVENOUS

## 2015-11-01 NOTE — ED Notes (Signed)
Nurse first-adult female with pt requested w/c for pt-arrived to car to find pt seated in driver seat of car-NAD-states she did drive the two here-pt stood without assist-sat in w/c-taken into ED WR

## 2015-11-01 NOTE — ED Notes (Signed)
Pt states her doctor called her tonight and told her potassium was high.

## 2015-11-02 ENCOUNTER — Encounter (HOSPITAL_BASED_OUTPATIENT_CLINIC_OR_DEPARTMENT_OTHER): Payer: Self-pay | Admitting: Emergency Medicine

## 2015-11-02 LAB — URINALYSIS, ROUTINE W REFLEX MICROSCOPIC
BILIRUBIN URINE: NEGATIVE
Glucose, UA: 100 mg/dL — AB
HGB URINE DIPSTICK: NEGATIVE
KETONES UR: NEGATIVE mg/dL
Nitrite: POSITIVE — AB
PROTEIN: NEGATIVE mg/dL
Specific Gravity, Urine: 1.016 (ref 1.005–1.030)
pH: 5 (ref 5.0–8.0)

## 2015-11-02 LAB — URINE MICROSCOPIC-ADD ON

## 2015-11-02 MED ORDER — CEPHALEXIN 500 MG PO CAPS: 500.0000 mg | ORAL_CAPSULE | Freq: Four times a day (QID) | ORAL | Status: DC

## 2015-11-02 MED ORDER — DEXTROSE 5 % IV SOLN
1.0000 g | Freq: Once | INTRAVENOUS | Status: AC
  Administered 2015-11-02: 1 g via INTRAVENOUS

## 2015-11-02 MED ORDER — CEFTRIAXONE SODIUM 1 G IJ SOLR
INTRAMUSCULAR | Status: AC
  Filled 2015-11-02: qty 10

## 2015-11-02 NOTE — ED Provider Notes (Signed)
TIME SEEN: 12:10 AM  CHIEF COMPLAINT: Elevated creatinine and potassium  HPI: Pt is a 65 y.o. female with history of hypertension, diabetes, hyperlipidemia, obesity who presents to the emergency department with concerns for acute renal failure and hyperkalemia. Reports she was seen by her doctor for regular checkup yesterday. PCP is Dr. Jacelyn Grip. States she had blood work which showed potassium of 6.5, creatinine of 1.98, bicarbonate of 18. Was instructed to come to the emergency department. She has absolutely 0 complaints other than some suprapubic pain and pressure when she urinates. Has had UTIs before. No chest pain or shortness of breath. No vomiting or diarrhea.   ROS: See HPI Constitutional: no fever  Eyes: no drainage  ENT: no runny nose   Cardiovascular:  no chest pain  Resp: no SOB  GI: no vomiting GU: no dysuria Integumentary: no rash  Allergy: no hives  Musculoskeletal: no leg swelling  Neurological: no slurred speech ROS otherwise negative  PAST MEDICAL HISTORY/PAST SURGICAL HISTORY:  Past Medical History  Diagnosis Date  . Diabetes mellitus ([place])   . Hypertension   . Hyperlipidemia   . Seasonal allergies   . OSA (obstructive sleep apnea) 08/16/2014    MEDICATIONS:  Prior to Admission medications   Medication Sig Start Date End Date Taking? Authorizing Provider  aspirin 81 MG tablet Take 81 mg by mouth daily.    Historical Provider, MD  atenolol (TENORMIN) 50 MG tablet Take 50 mg by mouth every morning.     Historical Provider, MD  BAYER MICROLET LANCETS lancets Use to test blood sugar 4 times daily as instructed. Dx: E11.65 10/26/15   Elayne Snare, MD  Blood Glucose Monitoring Suppl (BAYER CONTOUR NEXT LINK) w/Device KIT Use to test blood sugar daily. Dx: E11.65 10/26/15   Elayne Snare, MD  canagliflozin (INVOKANA) 300 MG TABS tablet Take 300 mg by mouth daily before breakfast. 08/20/15   Elayne Snare, MD  cyclobenzaprine (FLEXERIL) 10 MG tablet Take 10 mg by mouth daily.      Historical Provider, MD  enalapril (VASOTEC) 20 MG tablet Take 30 mg by mouth daily.  08/03/15   Historical Provider, MD  furosemide (LASIX) 40 MG tablet Take 40 mg by mouth every morning. Takes 1/2 tablet daily    Historical Provider, MD  gabapentin (NEURONTIN) 300 MG capsule Take 1 capsule (300 mg total) by mouth 3 (three) times daily. 10/12/15   Elayne Snare, MD  glucose blood (BAYER CONTOUR NEXT TEST) test strip Use to test blood sugar 4 times daily as instructed. Dx: E11.65 10/26/15   Elayne Snare, MD  HYDROcodone-acetaminophen (NORCO/VICODIN) 5-325 MG tablet  10/09/15   Historical Provider, MD  insulin aspart (NOVOLOG FLEXPEN) 100 UNIT/ML FlexPen Inject 10 Units into the skin 3 (three) times daily with meals. Patient taking differently: Inject 16 Units into the skin daily with supper.  07/27/15   Elayne Snare, MD  LANTUS SOLOSTAR 100 UNIT/ML Solostar Pen Inject 60 units daily 09/18/15   Elayne Snare, MD  loratadine (CLARITIN) 10 MG tablet Take 10 mg by mouth daily.    Historical Provider, MD  meloxicam (MOBIC) 7.5 MG tablet  08/31/15   Historical Provider, MD  metFORMIN (GLUCOPHAGE) 1000 MG tablet Take 1,000 mg by mouth 2 (two) times daily with a meal.    Historical Provider, MD  Omega-3 Fatty Acids (FISH OIL) 1000 MG CAPS Take 2 capsules by mouth daily.    Historical Provider, MD  VICTOZA 18 MG/3ML SOPN Inject 0.2 mLs (1.2 mg total) into the  skin daily. Inject once daily at the same time 10/12/15   Elayne Snare, MD  Vitamins A & D (VITAMIN A & D) 5000-400 UNITS CAPS Take by mouth.    Historical Provider, MD    ALLERGIES:  Allergies  Allergen Reactions  . Sulfa Antibiotics Other (See Comments)    Cant remember    SOCIAL HISTORY:  Social History  Substance Use Topics  . Smoking status: Former Smoker -- 2.00 packs/day for 20 years    Types: Cigarettes    Quit date: 11/22/1993  . Smokeless tobacco: Not on file  . Alcohol Use: 0.0 oz/week    0 Standard drinks or equivalent per week      Comment: Occasional    FAMILY HISTORY: Family History  Problem Relation Age of Onset  . Heart disease Father     MI  . Cancer - Lung Father     Small cell Lung CA    EXAM: BP 92/58 mmHg  Pulse 89  Temp(Src) 97.7 F (36.5 C) (Oral)  Resp 18  Ht 5' 2.5" (1.588 m)  Wt 290 lb (131.543 kg)  BMI 52.16 kg/m2  SpO2 97% CONSTITUTIONAL: Alert and oriented and responds appropriately to questions. Well-appearing; well-nourished, obese, pleasant, smiling HEAD: Normocephalic EYES: Conjunctivae clear, PERRL ENT: normal nose; no rhinorrhea; moist mucous membranes; pharynx without lesions noted NECK: Supple, no meningismus, no LAD  CARD: RRR; S1 and S2 appreciated; no murmurs, no clicks, no rubs, no gallops RESP: Normal chest excursion without splinting or tachypnea; breath sounds clear and equal bilaterally; no wheezes, no rhonchi, no rales, no hypoxia or respiratory distress, speaking full sentences ABD/GI: Normal bowel sounds; non-distended; soft, non-tender, no rebound, no guarding, no peritoneal signs BACK:  The back appears normal and is non-tender to palpation, there is no CVA tenderness EXT: Normal ROM in all joints; non-tender to palpation; no edema; normal capillary refill; no cyanosis, no calf tenderness or swelling    SKIN: Normal color for age and race; warm NEURO: Moves all extremities equally, sensation to light touch intact diffusely, cranial nerves II through XII intact PSYCH: The patient's mood and manner are appropriate. Grooming and personal hygiene are appropriate.  MEDICAL DECISION MAKING: Patient here with concerns for hyperkalemia, acute renal failure. She is on enalapril as well as Lasix. She does not take potassium. States her doctor recently went up on her enalapril because of uncontrolled hypertension. Repeat labs showed her potassium is 5.1. Her creatinine is 1.8, BUN 96. She does have bicarbonate of 18 but suspect this may be secondary to uremia. Normal anion gap. EKG  shows no new interval changes, ischemic changes. She has an old left bundle branch block which is unchanged. We'll also obtain urinalysis given her reports of pressure with urination. We'll give IV fluids for her potassium which is now normal but in the higher range of normal and also for her mild acute renal failure.  ED PROGRESS: Patient's urine does show nitrites, leukocytes and many bacteria. Culture is pending. Given dose of ceftriaxone in the emergency department. Of note, patient had many vital signs that showed mild hypotension. States that her blood pressure normally runs in the 034V to 425Z systolic. Denies that she is feeling lightheaded. When I went to the room to reassess patient. The blood pressure cuff was on her forearm. When I move the blood pressure cuff her pressure immediately came up to 106/57. At this time I do not feel the patient needs admission to the hospital. I will discharge her  on Keflex for her UTI. I have recommend she increase her water intake at home and close follow-up with her PCP to have her potassium, creatinine and urine rechecked in one week. I have printed out a copy of her results and she can take to her primary care doctor's office. Her creatinine has are improved as well as her potassium from her blood work drawn yesterday. Patient and her husband are extremely comfortable with this plan. Discussed with them return precautions. She verbalized understanding.   EKG Interpretation  Date/Time:  Thursday November 01 2015 23:59:44 EST Ventricular Rate:  88 PR Interval:  140 QRS Duration: 155 QT Interval:  362 QTC Calculation: 438 R Axis:     Text Interpretation:  Sinus rhythm Left bundle branch block No significant change since last tracing Confirmed by Laren Whaling,  DO, Fidelia Cathers [phone]) on 11/02/2015 12:07:06 AM        [place], DO 11/02/15 [id number]

## 2015-11-02 NOTE — ED Notes (Signed)
MD at bedside. 

## 2015-11-02 NOTE — Discharge Instructions (Signed)
Your potassium today was normal at 5.1.  Your kidney function was elevated at 1.8.  Please increase your water intake at home. Erect when you follow up with your primary care doctor in 1 week for recheck of your labs. You also have a urinary tract infection. We are discharging on antibiotics for this.   Acute Kidney Injury Acute kidney injury is any condition in which there is sudden (acute) damage to the kidneys. Acute kidney injury was previously known as acute kidney failure or acute renal failure. The kidneys are two organs that lie on either side of the spine between the middle of the back and the front of the abdomen. The kidneys:  Remove wastes and extra water from the blood.   Produce important hormones. These help keep bones strong, regulate blood pressure, and help create red blood cells.   Balance the fluids and chemicals in the blood and tissues. A small amount of kidney damage may not cause problems, but a large amount of damage may make it difficult or impossible for the kidneys to work the way they should. Acute kidney injury may develop into long-lasting (chronic) kidney disease. It may also develop into a life-threatening disease called end-stage kidney disease. Acute kidney injury can get worse very quickly, so it should be treated right away. Early treatment may prevent other kidney diseases from developing. CAUSES   A problem with blood flow to the kidneys. This may be caused by:   Blood loss.   Heart disease.   Severe burns.   Liver disease.  Direct damage to the kidneys. This may be caused by:  Some medicines.   A kidney infection.   Poisoning or consuming toxic substances.   A surgical wound.   A blow to the kidney area.   A problem with urine flow. This may be caused by:   Cancer.   Kidney stones.   An enlarged prostate. SIGNS AND SYMPTOMS   Swelling (edema) of the legs, ankles, or feet.   Tiredness (lethargy).   Nausea or  vomiting.   Confusion.   Problems with urination, such as:   Painful or burning feeling during urination.   Decreased urine production.   Frequent accidents in children who are potty trained.   Bloody urine.   Muscle twitches and cramps.   Shortness of breath.   Seizures.   Chest pain or pressure. Sometimes, no symptoms are present. DIAGNOSIS Acute kidney injury may be detected and diagnosed by tests, including blood, urine, imaging, or kidney biopsy tests.  TREATMENT Treatment of acute kidney injury varies depending on the cause and severity of the kidney damage. In mild cases, no treatment may be needed. The kidneys may heal on their own. If acute kidney injury is more severe, your health care provider will treat the cause of the kidney damage, help the kidneys heal, and prevent complications from occurring. Severe cases may require a procedure to remove toxic wastes from the body (dialysis) or surgery to repair kidney damage. Surgery may involve:   Repair of a torn kidney.   Removal of an obstruction. HOME CARE INSTRUCTIONS  Follow your prescribed diet.  Take medicines only as directed by your health care provider.  Do not take any new medicines (prescription, over-the-counter, or nutritional supplements) unless approved by your health care provider. Many medicines can worsen your kidney damage or may need to have the dose adjusted.   Keep all follow-up visits as directed by your health care provider. This is important.  Observe your condition to make sure you are healing as expected. SEEK IMMEDIATE MEDICAL CARE IF:  You are feeling ill or have severe pain in the back or side.   Your symptoms return or you have new symptoms.  You have any symptoms of end-stage kidney disease. These include:   Persistent itchiness.   Loss of appetite.   Headaches.   Abnormally dark or light skin.  Numbness in the hands or feet.   Easy bruising.    Frequent hiccups.   Menstruation stops.   You have a fever.  You have increased urine production.  You have pain or bleeding when urinating. MAKE SURE YOU:   Understand these instructions.  Will watch your condition.  Will get help right away if you are not doing well or get worse.   This information is not intended to replace advice given to you by your health care provider. Make sure you discuss any questions you have with your health care provider.   Document Released: 04/14/2011 Document Revised: 10/20/2014 Document Reviewed: 05/28/2012 Elsevier Interactive Patient Education 2016 [employer]  Urinary Tract Infection Urinary tract infections (UTIs) can develop anywhere along your urinary tract. Your urinary tract is your body's drainage system for removing wastes and extra water. Your urinary tract includes two kidneys, two ureters, a bladder, and a urethra. Your kidneys are a pair of bean-shaped organs. Each kidney is about the size of your fist. They are located below your ribs, one on each side of your spine. CAUSES Infections are caused by microbes, which are microscopic organisms, including fungi, viruses, and bacteria. These organisms are so small that they can only be seen through a microscope. Bacteria are the microbes that most commonly cause UTIs. SYMPTOMS  Symptoms of UTIs may vary by age and gender of the patient and by the location of the infection. Symptoms in young women typically include a frequent and intense urge to urinate and a painful, burning feeling in the bladder or urethra during urination. Older women and men are more likely to be tired, shaky, and weak and have muscle aches and abdominal pain. A fever may mean the infection is in your kidneys. Other symptoms of a kidney infection include pain in your back or sides below the ribs, nausea, and vomiting. DIAGNOSIS To diagnose a UTI, your caregiver will ask you about your symptoms. Your caregiver will  also ask you to provide a urine sample. The urine sample will be tested for bacteria and white blood cells. White blood cells are made by your body to help fight infection. TREATMENT  Typically, UTIs can be treated with medication. Because most UTIs are caused by a bacterial infection, they usually can be treated with the use of antibiotics. The choice of antibiotic and length of treatment depend on your symptoms and the type of bacteria causing your infection. HOME CARE INSTRUCTIONS  If you were prescribed antibiotics, take them exactly as your caregiver instructs you. Finish the medication even if you feel better after you have only taken some of the medication.  Drink enough water and fluids to keep your urine clear or pale yellow.  Avoid caffeine, tea, and carbonated beverages. They tend to irritate your bladder.  Empty your bladder often. Avoid holding urine for long periods of time.  Empty your bladder before and after sexual intercourse.  After a bowel movement, women should cleanse from front to back. Use each tissue only once. SEEK MEDICAL CARE IF:   You have back pain.  You develop a fever.  Your symptoms do not begin to resolve within 3 days. SEEK IMMEDIATE MEDICAL CARE IF:   You have severe back pain or lower abdominal pain.  You develop chills.  You have nausea or vomiting.  You have continued burning or discomfort with urination. MAKE SURE YOU:   Understand these instructions.  Will watch your condition.  Will get help right away if you are not doing well or get worse.   This information is not intended to replace advice given to you by your health care provider. Make sure you discuss any questions you have with your health care provider.   Document Released: 07/09/2005 Document Revised: 06/20/2015 Document Reviewed: 11/07/2011 Elsevier Interactive Patient Education [employer]

## 2015-11-16 ENCOUNTER — Emergency Department (HOSPITAL_BASED_OUTPATIENT_CLINIC_OR_DEPARTMENT_OTHER)
Admission: EM | Admit: 2015-11-16 | Discharge: 2015-11-17 | Disposition: A | Discharge status: Home / Self Care | Payer: BLUE CROSS/BLUE SHIELD | Attending: Emergency Medicine | Admitting: Emergency Medicine

## 2015-11-16 ENCOUNTER — Encounter (HOSPITAL_BASED_OUTPATIENT_CLINIC_OR_DEPARTMENT_OTHER): Payer: Self-pay | Admitting: *Deleted

## 2015-11-16 DIAGNOSIS — I10 Essential (primary) hypertension: Secondary | ICD-10-CM | POA: Diagnosis not present

## 2015-11-16 DIAGNOSIS — Z7984 Long term (current) use of oral hypoglycemic drugs: Secondary | ICD-10-CM | POA: Diagnosis not present

## 2015-11-16 DIAGNOSIS — Z7982 Long term (current) use of aspirin: Secondary | ICD-10-CM | POA: Diagnosis not present

## 2015-11-16 DIAGNOSIS — Z8669 Personal history of other diseases of the nervous system and sense organs: Secondary | ICD-10-CM | POA: Diagnosis not present

## 2015-11-16 DIAGNOSIS — E119 Type 2 diabetes mellitus without complications: Secondary | ICD-10-CM | POA: Insufficient documentation

## 2015-11-16 DIAGNOSIS — R600 Localized edema: Secondary | ICD-10-CM | POA: Insufficient documentation

## 2015-11-16 DIAGNOSIS — E875 Hyperkalemia: Secondary | ICD-10-CM | POA: Diagnosis not present

## 2015-11-16 DIAGNOSIS — E785 Hyperlipidemia, unspecified: Secondary | ICD-10-CM | POA: Insufficient documentation

## 2015-11-16 DIAGNOSIS — Z794 Long term (current) use of insulin: Secondary | ICD-10-CM | POA: Diagnosis not present

## 2015-11-16 DIAGNOSIS — Z87891 Personal history of nicotine dependence: Secondary | ICD-10-CM | POA: Insufficient documentation

## 2015-11-16 DIAGNOSIS — R7989 Other specified abnormal findings of blood chemistry: Secondary | ICD-10-CM | POA: Diagnosis present

## 2015-11-16 DIAGNOSIS — N289 Disorder of kidney and ureter, unspecified: Secondary | ICD-10-CM | POA: Diagnosis not present

## 2015-11-16 DIAGNOSIS — D649 Anemia, unspecified: Secondary | ICD-10-CM | POA: Insufficient documentation

## 2015-11-16 DIAGNOSIS — Z791 Long term (current) use of non-steroidal anti-inflammatories (NSAID): Secondary | ICD-10-CM | POA: Insufficient documentation

## 2015-11-16 DIAGNOSIS — R5383 Other fatigue: Secondary | ICD-10-CM | POA: Diagnosis not present

## 2015-11-16 LAB — CBC WITH DIFFERENTIAL/PLATELET
BASOS ABS: 0 10*3/uL (ref 0.0–0.1)
Basophils Relative: 0 %
EOS PCT: 2 %
Eosinophils Absolute: 0.3 10*3/uL (ref 0.0–0.7)
HCT: 36.1 % (ref 36.0–46.0)
HEMOGLOBIN: 11.5 g/dL — AB (ref 12.0–15.0)
LYMPHS ABS: 4.3 10*3/uL — AB (ref 0.7–4.0)
LYMPHS PCT: 26 %
MCH: 27.8 pg (ref 26.0–34.0)
MCHC: 31.9 g/dL (ref 30.0–36.0)
MCV: 87.2 fL (ref 78.0–100.0)
Monocytes Absolute: 0.7 10*3/uL (ref 0.1–1.0)
Monocytes Relative: 4 %
NEUTROS ABS: 11.3 10*3/uL — AB (ref 1.7–7.7)
NEUTROS PCT: 68 %
PLATELETS: 247 10*3/uL (ref 150–400)
RBC: 4.14 MIL/uL (ref 3.87–5.11)
RDW: 15.7 % — ABNORMAL HIGH (ref 11.5–15.5)
WBC: 16.6 10*3/uL — AB (ref 4.0–10.5)

## 2015-11-16 LAB — URINALYSIS, ROUTINE W REFLEX MICROSCOPIC
BILIRUBIN URINE: NEGATIVE
GLUCOSE, UA: 100 mg/dL — AB
HGB URINE DIPSTICK: NEGATIVE
KETONES UR: NEGATIVE mg/dL
Nitrite: NEGATIVE
PH: 5.5 (ref 5.0–8.0)
PROTEIN: NEGATIVE mg/dL
Specific Gravity, Urine: 1.014 (ref 1.005–1.030)

## 2015-11-16 LAB — COMPREHENSIVE METABOLIC PANEL
ALT: 31 U/L (ref 14–54)
ANION GAP: 11 (ref 5–15)
AST: 24 U/L (ref 15–41)
Albumin: 4 g/dL (ref 3.5–5.0)
Alkaline Phosphatase: 66 U/L (ref 38–126)
BILIRUBIN TOTAL: 0.5 mg/dL (ref 0.3–1.2)
BUN: 100 mg/dL — ABNORMAL HIGH (ref 6–20)
CHLORIDE: 111 mmol/L (ref 101–111)
CO2: 18 mmol/L — ABNORMAL LOW (ref 22–32)
Calcium: 9.5 mg/dL (ref 8.9–10.3)
Creatinine, Ser: 2.19 mg/dL — ABNORMAL HIGH (ref 0.44–1.00)
GFR, EST AFRICAN AMERICAN: 27 mL/min — AB (ref >60)
GFR, EST NON AFRICAN AMERICAN: 23 mL/min — AB (ref >60)
Glucose, Bld: 117 mg/dL — ABNORMAL HIGH (ref 65–99)
POTASSIUM: 5.5 mmol/L — AB (ref 3.5–5.1)
Sodium: 140 mmol/L (ref 135–145)
TOTAL PROTEIN: 7.8 g/dL (ref 6.5–8.1)

## 2015-11-16 LAB — URINE MICROSCOPIC-ADD ON: RBC / HPF: NONE SEEN RBC/hpf (ref 0–5)

## 2015-11-16 NOTE — ED Notes (Signed)
Sent here from [facility]D office for abnormal labs , increased  Bun and creatine

## 2015-11-16 NOTE — ED Provider Notes (Signed)
CSN: [id number]     Arrival date & time 11/16/15  2257 History  By signing my name below, I, Irene Pap, attest that this documentation has been prepared under the direction and in the presence of Delora Fuel, MD. Electronically Signed: Irene Pap, ED Scribe. 11/16/2015. 11:54 PM.  Chief Complaint  Patient presents with  . Abnormal Lab   The history is provided by the patient. No language interpreter was used.  HPI Comments: Melanie Reeves is a 65 y.o. female with a hx of HTN, DM, and OSA who presents to the Emergency Department complaining of abnormal labs onset 12 hours ago. Pt states that she saw her PCP at 11 AM today and had labs performed, which showed elevated BUN and creatine levels. Pt was directed to the ED by her PCP to receive fluids. Pt reports having fatigue and chills. She was seen on 11/02/15 in the ED for elevated creatinine and potassium levels, for which she was given Keflex. She denies fever, nausea, vomiting, rash or itching.  PCP lab levels BUN: 101 Creatine: 2.47 Potassium: 6.9 CO2: 17  Past Medical History  Diagnosis Date  . Diabetes mellitus ([place])   . Hypertension   . Hyperlipidemia   . Seasonal allergies   . OSA (obstructive sleep apnea) 08/16/2014  . Morbid obesity [facility])    Past Surgical History  Procedure Laterality Date  . Tonsillectomy    . Cholecystectomy    . Colonoscopy with propofol N/A 11/29/2014    Procedure: COLONOSCOPY WITH PROPOFOL;  Surgeon: Arta Silence, MD;  Location: [facility] ENDOSCOPY;  Service: Endoscopy;  Laterality: N/A;   Family History  Problem Relation Age of Onset  . Heart disease Father     MI  . Cancer - Lung Father     Small cell Lung CA   Social History  Substance Use Topics  . Smoking status: Former Smoker -- 2.00 packs/day for 20 years    Types: Cigarettes    Quit date: 11/22/1993  . Smokeless tobacco: None  . Alcohol Use: 0.0 oz/week    0 Standard drinks or equivalent per week     Comment: Occasional   OB  History    No data available     Review of Systems  Constitutional: Positive for chills and fatigue. Negative for fever.  Gastrointestinal: Negative for nausea and vomiting.  Skin: Negative for rash.  All other systems reviewed and are negative.  Allergies  Sulfa antibiotics  Home Medications   Prior to Admission medications   Medication Sig Start Date End Date Taking? Authorizing Provider  aspirin 81 MG tablet Take 81 mg by mouth daily.    Historical Provider, MD  atenolol (TENORMIN) 50 MG tablet Take 50 mg by mouth every morning.     Historical Provider, MD  BAYER MICROLET LANCETS lancets Use to test blood sugar 4 times daily as instructed. Dx: E11.65 10/26/15   Elayne Snare, MD  Blood Glucose Monitoring Suppl (BAYER CONTOUR NEXT LINK) w/Device KIT Use to test blood sugar daily. Dx: E11.65 10/26/15   Elayne Snare, MD  canagliflozin (INVOKANA) 300 MG TABS tablet Take 300 mg by mouth daily before breakfast. 08/20/15   Elayne Snare, MD  cephALEXin (KEFLEX) 500 MG capsule Take 1 capsule (500 mg total) by mouth 4 (four) times daily. 11/02/15   Kristen N Ward, DO  cyclobenzaprine (FLEXERIL) 10 MG tablet Take 10 mg by mouth daily.     Historical Provider, MD  enalapril (VASOTEC) 20 MG tablet Take 30 mg by  mouth daily.  08/03/15   Historical Provider, MD  furosemide (LASIX) 40 MG tablet Take 40 mg by mouth every morning. Takes 1/2 tablet daily    Historical Provider, MD  gabapentin (NEURONTIN) 300 MG capsule Take 1 capsule (300 mg total) by mouth 3 (three) times daily. 10/12/15   Elayne Snare, MD  glucose blood (BAYER CONTOUR NEXT TEST) test strip Use to test blood sugar 4 times daily as instructed. Dx: E11.65 10/26/15   Elayne Snare, MD  HYDROcodone-acetaminophen (NORCO/VICODIN) 5-325 MG tablet  10/09/15   Historical Provider, MD  insulin aspart (NOVOLOG FLEXPEN) 100 UNIT/ML FlexPen Inject 10 Units into the skin 3 (three) times daily with meals. Patient taking differently: Inject 16 Units into the skin  daily with supper.  07/27/15   Elayne Snare, MD  LANTUS SOLOSTAR 100 UNIT/ML Solostar Pen Inject 60 units daily 09/18/15   Elayne Snare, MD  loratadine (CLARITIN) 10 MG tablet Take 10 mg by mouth daily.    Historical Provider, MD  meloxicam (MOBIC) 7.5 MG tablet  08/31/15   Historical Provider, MD  metFORMIN (GLUCOPHAGE) 1000 MG tablet Take 1,000 mg by mouth 2 (two) times daily with a meal.    Historical Provider, MD  Omega-3 Fatty Acids (FISH OIL) 1000 MG CAPS Take 2 capsules by mouth daily.    Historical Provider, MD  VICTOZA 18 MG/3ML SOPN Inject 0.2 mLs (1.2 mg total) into the skin daily. Inject once daily at the same time 10/12/15   Elayne Snare, MD  Vitamins A & D (VITAMIN A & D) 5000-400 UNITS CAPS Take by mouth.    Historical Provider, MD   BP 138/81 mmHg  Pulse 94  Temp(Src) 97.7 F (36.5 C)  Resp 18  Ht '5\' 2"'  (1.575 m)  Wt 291 lb (131.997 kg)  BMI 53.21 kg/m2  SpO2 99% Physical Exam  Constitutional: She is oriented to person, place, and time. She appears well-developed and well-nourished.  obese  HENT:  Head: Normocephalic and atraumatic.  Eyes: EOM are normal. Pupils are equal, round, and reactive to light.  Neck: Normal range of motion. Neck supple. No JVD present.  Cardiovascular: Normal rate, regular rhythm and normal heart sounds.  Exam reveals no gallop and no friction rub.   No murmur heard. Pulmonary/Chest: Effort normal and breath sounds normal. She has no wheezes. She has no rales. She exhibits no tenderness.  Abdominal: Soft. Bowel sounds are normal. She exhibits no distension and no mass. There is no tenderness.  Musculoskeletal: Normal range of motion. She exhibits edema.  1+ pitting edema with moderate venous stasis changes bilaterally  Lymphadenopathy:    She has no cervical adenopathy.  Neurological: She is alert and oriented to person, place, and time. No cranial nerve deficit. She exhibits normal muscle tone. Coordination normal.  Skin: Skin is warm and dry. No  rash noted.  Psychiatric: She has a normal mood and affect. Her behavior is normal. Judgment and thought content normal.  Nursing note and vitals reviewed.   ED Course  Procedures (including critical care time) DIAGNOSTIC STUDIES: Oxygen Saturation is 99% on RA, normal by my interpretation.    COORDINATION OF CARE: 11:18 PM-Discussed treatment plan which includes labs with pt at bedside and pt agreed to plan.    Labs Review Results for orders placed or performed during the hospital encounter of 11/16/15  Urinalysis, Routine w reflex microscopic (not at [facility])  Result Value Ref Range   Color, Urine YELLOW YELLOW   APPearance CLOUDY (A) CLEAR  Specific Gravity, Urine 1.014 1.005 - 1.030   pH 5.5 5.0 - 8.0   Glucose, UA 100 (A) NEGATIVE mg/dL   Hgb urine dipstick NEGATIVE NEGATIVE   Bilirubin Urine NEGATIVE NEGATIVE   Ketones, ur NEGATIVE NEGATIVE mg/dL   Protein, ur NEGATIVE NEGATIVE mg/dL   Nitrite NEGATIVE NEGATIVE   Leukocytes, UA MODERATE (A) NEGATIVE  Comprehensive metabolic panel  Result Value Ref Range   Sodium 140 135 - 145 mmol/L   Potassium 5.5 (H) 3.5 - 5.1 mmol/L   Chloride 111 101 - 111 mmol/L   CO2 18 (L) 22 - 32 mmol/L   Glucose, Bld 117 (H) 65 - 99 mg/dL   BUN 100 (H) 6 - 20 mg/dL   Creatinine, Ser 2.19 (H) 0.44 - 1.00 mg/dL   Calcium 9.5 8.9 - 10.3 mg/dL   Total Protein 7.8 6.5 - 8.1 g/dL   Albumin 4.0 3.5 - 5.0 g/dL   AST 24 15 - 41 U/L   ALT 31 14 - 54 U/L   Alkaline Phosphatase 66 38 - 126 U/L   Total Bilirubin 0.5 0.3 - 1.2 mg/dL   GFR calc non Af Amer 23 (L) >60 mL/min   GFR calc Af Amer 27 (L) >60 mL/min   Anion gap 11 5 - 15  CBC with Differential  Result Value Ref Range   WBC 16.6 (H) 4.0 - 10.5 K/uL   RBC 4.14 3.87 - 5.11 MIL/uL   Hemoglobin 11.5 (L) 12.0 - 15.0 g/dL   HCT 36.1 36.0 - 46.0 %   MCV 87.2 78.0 - 100.0 fL   MCH 27.8 26.0 - 34.0 pg   MCHC 31.9 30.0 - 36.0 g/dL   RDW 15.7 (H) 11.5 - 15.5 %   Platelets 247 150 - 400 K/uL    Neutrophils Relative % 68 %   Neutro Abs 11.3 (H) 1.7 - 7.7 K/uL   Lymphocytes Relative 26 %   Lymphs Abs 4.3 (H) 0.7 - 4.0 K/uL   Monocytes Relative 4 %   Monocytes Absolute 0.7 0.1 - 1.0 K/uL   Eosinophils Relative 2 %   Eosinophils Absolute 0.3 0.0 - 0.7 K/uL   Basophils Relative 0 %   Basophils Absolute 0.0 0.0 - 0.1 K/uL  Urine microscopic-add on  Result Value Ref Range   Squamous Epithelial / LPF 0-5 (A) NONE SEEN   WBC, UA 6-30 0 - 5 WBC/hpf   RBC / HPF NONE SEEN 0 - 5 RBC/hpf   Bacteria, UA RARE (A) NONE SEEN   I have personally reviewed and evaluated these images and lab results as part of my medical decision-making.   MDM   Final diagnoses:  Renal insufficiency  Hyperkalemia  Normochromic normocytic anemia    Renal insufficiency which is apparently worsening with the hyperkalemia. Old records are reviewed in she was seen in the ED 2 weeks ago with similar complaints. Labs today show some worsening of renal failure with creatinine increasing from 1.80 to 2.19, and creatinine increasing from 96 to 100. Potassium is only mildly elevated on testing here. ECG shows no evidence of hyperkalemia so do not see an indication to put her on potassium lowering agents. She is on enalapril and meloxicam-both of which can cause kidney failure. She is also on furosemide which could conceivably worsen renal failure. It is noted that her BUN is much more elevated and creatinine suggesting prerenal causes. She is given a liter of saline in the ED and discharged with instructions to stop taking enalapril,  meloxicam, furosemide. She is referred to nephrology for outpatient workup. Recommended repeat metabolic panel in 1 week.   I personally performed the services described in this documentation, which was scribed in my presence. The recorded information has been reviewed and is accurate.      Delora Fuel, MD 03/49/17 [id number]

## 2015-11-17 MED ORDER — SODIUM CHLORIDE 0.9 % IV BOLUS (SEPSIS)
1000.0000 mL | Freq: Once | INTRAVENOUS | Status: AC
  Administered 2015-11-17: 1000 mL via INTRAVENOUS

## 2015-11-17 NOTE — Discharge Instructions (Signed)
Stop taking enalapril (Vasotec), meloxicam (Mobic) and furosemide (Lasix) since they can cause problems with your kidneys.  You should have your kidney function tests repeated in about one week.

## 2015-11-17 NOTE — ED Notes (Signed)
Pt verbalizes understanding of d/c instructions and denies any further needs at this time. 

## 2015-11-20 ENCOUNTER — Telehealth: Payer: Self-pay | Admitting: Endocrinology

## 2015-11-20 NOTE — Telephone Encounter (Signed)
[facility] and pt is awaiting a referral for pt to be seen here

## 2015-11-20 NOTE — Telephone Encounter (Signed)
Please see below.

## 2015-11-20 NOTE — Telephone Encounter (Signed)
Please see what the problem is, let me know

## 2015-11-21 NOTE — Telephone Encounter (Signed)
Message left on Melanie Reeves's vm to try and get this scheduled.

## 2015-11-27 ENCOUNTER — Telehealth: Payer: Self-pay | Admitting: Endocrinology

## 2015-11-27 ENCOUNTER — Other Ambulatory Visit: Payer: Self-pay | Admitting: Endocrinology

## 2015-11-27 DIAGNOSIS — N184 Chronic kidney disease, stage 4 (severe): Secondary | ICD-10-CM

## 2015-11-27 NOTE — Telephone Encounter (Signed)
Please see below, patient wants a referral now, instead of waiting, she said she's at a level 4 and has very little urine output. Please advise, she doesn't want to wait until she sees you on the 27th.

## 2015-11-27 NOTE — Telephone Encounter (Signed)
Noted, patient is aware. 

## 2015-11-27 NOTE — Telephone Encounter (Signed)
Pt needs referral to [facility] please

## 2015-11-27 NOTE — Telephone Encounter (Signed)
Consultation has been sent.  She needs to stop her Invokana and metformin along with any salt substitutes, bananas and high potassium foods.  Normally this should have been done by her PCP

## 2015-11-28 ENCOUNTER — Telehealth: Payer: Self-pay | Admitting: Endocrinology

## 2015-11-28 NOTE — Telephone Encounter (Signed)
Fax referral to [facility] [phone]. They are not on Epic

## 2015-11-30 ENCOUNTER — Telehealth: Payer: Self-pay | Admitting: Endocrinology

## 2015-11-30 NOTE — Telephone Encounter (Signed)
If she is referring to fasting glucose then she can increase her Lantus from 55 up to 60 units

## 2015-11-30 NOTE — Telephone Encounter (Signed)
Noted patient is aware 

## 2015-11-30 NOTE — Telephone Encounter (Signed)
Please see below, I was unable to reach her on cb

## 2015-11-30 NOTE — Telephone Encounter (Signed)
Patient called stating that she has been taken off her metformin and invokana  Her blood sugars have been high-190  Please advise    Thank you

## 2015-12-04 ENCOUNTER — Telehealth: Payer: Self-pay | Admitting: Endocrinology

## 2015-12-04 NOTE — Telephone Encounter (Signed)
Melanie Reeves Please see below.

## 2015-12-04 NOTE — Telephone Encounter (Signed)
Pt called asking about a referral that Dr. Lucianne Muss sent to a kidney specialist and said they contacted her and said it needs to be a paper referral.

## 2015-12-05 ENCOUNTER — Other Ambulatory Visit (INDEPENDENT_AMBULATORY_CARE_PROVIDER_SITE_OTHER): Payer: BLUE CROSS/BLUE SHIELD

## 2015-12-05 DIAGNOSIS — E1165 Type 2 diabetes mellitus with hyperglycemia: Secondary | ICD-10-CM | POA: Diagnosis not present

## 2015-12-05 DIAGNOSIS — Z794 Long term (current) use of insulin: Secondary | ICD-10-CM

## 2015-12-05 LAB — COMPREHENSIVE METABOLIC PANEL
ALBUMIN: 3.8 g/dL (ref 3.5–5.2)
ALT: 26 U/L (ref 0–35)
AST: 18 U/L (ref 0–37)
Alkaline Phosphatase: 66 U/L (ref 39–117)
BUN: 58 mg/dL — ABNORMAL HIGH (ref 6–23)
CALCIUM: 9 mg/dL (ref 8.4–10.5)
CHLORIDE: 107 meq/L (ref 96–112)
CO2: 22 meq/L (ref 19–32)
Creatinine, Ser: 1.78 mg/dL — ABNORMAL HIGH (ref 0.40–1.20)
GFR: 30.64 mL/min — ABNORMAL LOW (ref >60.00)
Glucose, Bld: 125 mg/dL — ABNORMAL HIGH (ref 70–99)
POTASSIUM: 4.8 meq/L (ref 3.5–5.1)
Sodium: 139 mEq/L (ref 135–145)
Total Bilirubin: 0.3 mg/dL (ref 0.2–1.2)
Total Protein: 7 g/dL (ref 6.0–8.3)

## 2015-12-05 LAB — HEMOGLOBIN A1C: Hgb A1c MFr Bld: 7.4 % — ABNORMAL HIGH (ref 4.6–6.5)

## 2015-12-05 LAB — MICROALBUMIN / CREATININE URINE RATIO
CREATININE, U: 142.8 mg/dL
MICROALB UR: 0.8 mg/dL (ref 0.0–1.9)
MICROALB/CREAT RATIO: 0.6 mg/g (ref 0.0–30.0)

## 2015-12-05 NOTE — Telephone Encounter (Signed)
Faxed to [facility]

## 2015-12-06 ENCOUNTER — Other Ambulatory Visit: Payer: BLUE CROSS/BLUE SHIELD

## 2015-12-10 ENCOUNTER — Other Ambulatory Visit: Payer: Self-pay | Admitting: Nephrology

## 2015-12-10 ENCOUNTER — Encounter: Payer: Self-pay | Admitting: Endocrinology

## 2015-12-10 ENCOUNTER — Ambulatory Visit (INDEPENDENT_AMBULATORY_CARE_PROVIDER_SITE_OTHER): Payer: BLUE CROSS/BLUE SHIELD | Admitting: Endocrinology

## 2015-12-10 VITALS — BP 136/82 | HR 96 | Temp 97.7°F | Resp 16 | Ht 63.0 in | Wt 293.4 lb

## 2015-12-10 DIAGNOSIS — E1165 Type 2 diabetes mellitus with hyperglycemia: Secondary | ICD-10-CM | POA: Diagnosis not present

## 2015-12-10 DIAGNOSIS — N183 Chronic kidney disease, stage 3 unspecified: Secondary | ICD-10-CM

## 2015-12-10 DIAGNOSIS — Z794 Long term (current) use of insulin: Secondary | ICD-10-CM | POA: Diagnosis not present

## 2015-12-10 NOTE — Progress Notes (Signed)
Patient ID: Melanie Reeves, female   DOB: 16-Mar-1951, 65 y.o.   MRN: [id number]           Reason for Appointment: f/u  for Type 2 Diabetes  Referring physician: Yaakov Guthrie  History of Present Illness:          Date of diagnosis of type 2 diabetes mellitus:  2011       Background history:     She was tested for diabetes when she presented with numbness and tingling in her feet to the health [facility]. She does not know what her blood sugars were initially  She was however started on both metformin and glipizide at that time. She thinks that in early 2015 she was started on Lantus insulin also developed poor control  Her record indicates A1c levels ranging from 8.2-9.3 and higher at 11.4 in 9/16  Recent history:   INSULIN regimen is: Lantus 60U at bedtime, Novolog 12--12-15 at supper       Because of her poor control she was started on Invokana 100 mg daily on her initial visit on 07/27/15; she was told to stop her glipizide and low-dose Onglyza. She was also told to start Victoza on her last visit in early December because of continued hyperglycemia and difficulty controlling weight and portions  With her renal function deteriorating in 1/17 her Invokana and metformin were stopped Her Lantus was recently increased by 5 units with readings going up  Current blood sugar patterns and problems identified:  She still has fairly good readings but they are higher in the morning now  Checking blood sugar very sporadically later in the day and these are not usually high except a couple of time  Her highest reading was today morning because of her getting her insulin last night She still is not able to lose weight although she thinks she is trying to be a little more active  She has not changed her mealtime insulin doses although may take a little more for larger meals in the evening  Oral hypoglycemic drugs the patient is taking are:  None Side effects from medications have been:  none  Compliance with the medical regimen: fair Hypoglycemia:  never  Glucose monitoring:  done  1-2  times a day         Glucometer: One Touch.      Blood Glucose readings by time of day and median for 4 weeks from meter download:  Mean values apply above for all meters except median for One Touch  PRE-MEAL Fasting Lunch Dinner Bedtime Overall  Glucose range:  112-201    136, 157   123-192    Mean/median:  142     138    Self-care: The diet that the patient has been following is: recently restricting carbohydrates and some fats.     Meal times: Breakfast:10-11 AM Lunch:4 PM Dinner: 7 pm   Typical meal intake: Breakfast is sandwich or biscuit.  Lunch.  Evening meal is meat, vegetables and pasta               Dietician visit, most recent: none                Exercise:  minimal because of pain and discomfort in her legs and dyspnea on exertion  Weight history: Lowest 178 lb several years ago, more recently 300-330  Wt Readings from Last 3 Encounters:  12/10/15 293 lb 6.4 oz (133.085 kg)  11/16/15 291 lb (131.997 kg)  11/01/15 290 lb (131.543 kg)   Glycemic control:   Lab Results  Component Value Date   HGBA1C 7.4* 12/05/2015   HGBA1C 9.1* 09/17/2015   HGBA1C 11.4* 07/02/2015   Lab Results  Component Value Date   MICROALBUR 0.8 12/05/2015   LDLCALC 104* 09/17/2015   CREATININE 1.78* 12/05/2015    microalbumin/creatinine ratio 11.7     Medication List       This list is accurate as of: 12/10/15  8:55 PM.  Always use your most recent med list.               aspirin 81 MG tablet  Take 81 mg by mouth daily.     atenolol 50 MG tablet  Commonly known as:  TENORMIN  Take 50 mg by mouth every morning.     BAYER CONTOUR NEXT LINK w/Device Kit  Use to test blood sugar daily. Dx: E11.65     BAYER MICROLET LANCETS lancets  Use to test blood sugar 4 times daily as instructed. Dx: E11.65     calcitRIOL 0.25 MCG capsule  Commonly known as:  ROCALTROL  Take 0.25  mcg by mouth daily.     cyclobenzaprine 10 MG tablet  Commonly known as:  FLEXERIL  Take 10 mg by mouth daily.     DULoxetine 30 MG capsule  Commonly known as:  CYMBALTA  Take 30 mg by mouth daily.     Fish Oil 1000 MG Caps  Take 2 capsules by mouth daily.     gabapentin 300 MG capsule  Commonly known as:  NEURONTIN  Take 1 capsule (300 mg total) by mouth 3 (three) times daily.     glucose blood test strip  Commonly known as:  BAYER CONTOUR NEXT TEST  Use to test blood sugar 4 times daily as instructed. Dx: E11.65     HYDROcodone-acetaminophen 5-325 MG tablet  Commonly known as:  NORCO/VICODIN     insulin aspart 100 UNIT/ML FlexPen  Commonly known as:  NOVOLOG FLEXPEN  Inject 10 Units into the skin 3 (three) times daily with meals.     LANTUS SOLOSTAR 100 UNIT/ML Solostar Pen  Generic drug:  Insulin Glargine  Inject 60 units daily     loratadine 10 MG tablet  Commonly known as:  CLARITIN  Take 10 mg by mouth daily.     VICTOZA 18 MG/3ML Sopn  Generic drug:  Liraglutide  Inject 0.2 mLs (1.2 mg total) into the skin daily. Inject once daily at the same time     Vitamin A & D 5000-400 units Caps  Take by mouth.        Allergies:  Allergies  Allergen Reactions  . Sulfa Antibiotics Other (See Comments)    Cant remember    Past Medical History  Diagnosis Date  . Diabetes mellitus ([place])   . Hypertension   . Hyperlipidemia   . Seasonal allergies   . OSA (obstructive sleep apnea) 08/16/2014  . Morbid obesity [facility])     Past Surgical History  Procedure Laterality Date  . Tonsillectomy    . Cholecystectomy    . Colonoscopy with propofol N/A 11/29/2014    Procedure: COLONOSCOPY WITH PROPOFOL;  Surgeon: Arta Silence, MD;  Location: [facility];  Service: Endoscopy;  Laterality: N/A;    Family History  Problem Relation Age of Onset  . Heart disease Father     MI  . Cancer - Lung Father     Small cell Lung CA    Social  History:  reports that she quit  smoking about 22 years ago. Her smoking use included Cigarettes. She has a 40 pack-year smoking history. She does not have any smokeless tobacco history on file. She reports that she drinks alcohol. She reports that she does not use illicit drugs.    Review of Systems    Lipid history: She is not on any treatment, followed by PCP LDL over 100 Reportedly has leg weakness from simvastatin.   Currently taking fish oil    Lab Results  Component Value Date   CHOL 175 09/17/2015   HDL 32.20* 09/17/2015   LDLCALC 104* 09/17/2015   TRIG 190.0* 09/17/2015   CHOLHDL 5 09/17/2015         .  Most recent eye exam was 2016   Hypertension:  On treatment from PCP; currently taking atenolol and 20 mg enalapril  Has persistent numbness for the last several years.  Occasionally  has sharp twinges of pain also Currently taking Cymbalta and Neurontin  Last foot exam in 10/16 showed:  Monofilament sensation is decreased distally on all the toes and plantar surfaces  She is not taking the meloxicam now    Physical Examination:  BP 136/82 mmHg  Pulse 96  Temp(Src) 97.7 F (36.5 C)  Resp 16  Ht '5\' 3"'  (1.6 m)  Wt 293 lb 6.4 oz (133.085 kg)  BMI 51.99 kg/m2  SpO2 97%       ASSESSMENT:  Diabetes type 2, uncontrolled with morbid obesity and long-standing poor control See history of present illness for detailed discussion of his current management, blood sugar patterns and problems identified As discussed above she has overall fairly good blood sugar control although blood sugars in the mornings are trending much higher because of stopping metformin and Invokana She continues to benefit from Victoza however Currently taking relatively high dose of basal insulin Her weight loss was somewhat better with Invokana which has been stopped because of renal dysfunction  She may benefit from using [place] but appears not to be covered by insurance currently  PLAN:    Continue VICTOZA 1.2 mg as  above.   Increase Lantus by 4 units for now and keep morning sugars under 130  Discussed blood sugar targets at various times and need to monitor more consistently after meals  Increase exercise as tolerated  Call if blood sugars are not consistently controlled, discussed blood sugar targets at various times   Patient Instructions  Check blood sugars on waking up 3-4  times a week Also check blood sugars about 2 hours after a meal and do this after different meals by rotation  Recommended blood sugar levels on waking up is 90-130 and about 2 hours after meal is 130-160  Please bring your blood sugar monitor to each visit, thank you  Lantus 64 units      Counseling time on subjects discussed above is over 50% of today's 25 minute visit   Sarahelizabeth Conway 12/10/2015, 8:55 PM   Note: This office note was prepared with Dragon voice recognition system technology. Any transcriptional errors that result from this process are unintentional.

## 2015-12-10 NOTE — Patient Instructions (Signed)
Check blood sugars on waking up 3-4  times a week Also check blood sugars about 2 hours after a meal and do this after different meals by rotation  Recommended blood sugar levels on waking up is 90-130 and about 2 hours after meal is 130-160  Please bring your blood sugar monitor to each visit, thank you  Lantus 64 units

## 2015-12-13 ENCOUNTER — Ambulatory Visit
Admission: RE | Admit: 2015-12-13 | Discharge: 2015-12-13 | Disposition: A | Discharge status: Home / Self Care | Payer: BLUE CROSS/BLUE SHIELD | Source: Ambulatory Visit | Attending: Nephrology | Admitting: Nephrology

## 2015-12-13 DIAGNOSIS — N183 Chronic kidney disease, stage 3 unspecified: Secondary | ICD-10-CM

## 2016-01-07 ENCOUNTER — Other Ambulatory Visit: Payer: Self-pay | Admitting: Endocrinology

## 2016-01-22 ENCOUNTER — Other Ambulatory Visit (INDEPENDENT_AMBULATORY_CARE_PROVIDER_SITE_OTHER): Payer: BLUE CROSS/BLUE SHIELD

## 2016-01-22 DIAGNOSIS — E1165 Type 2 diabetes mellitus with hyperglycemia: Secondary | ICD-10-CM

## 2016-01-22 DIAGNOSIS — Z794 Long term (current) use of insulin: Secondary | ICD-10-CM | POA: Diagnosis not present

## 2016-01-22 LAB — COMPREHENSIVE METABOLIC PANEL
ALBUMIN: 4 g/dL (ref 3.5–5.2)
ALT: 23 U/L (ref 0–35)
AST: 22 U/L (ref 0–37)
Alkaline Phosphatase: 78 U/L (ref 39–117)
BUN: 27 mg/dL — ABNORMAL HIGH (ref 6–23)
CALCIUM: 10 mg/dL (ref 8.4–10.5)
CHLORIDE: 95 meq/L — AB (ref 96–112)
CO2: 32 meq/L (ref 19–32)
Creatinine, Ser: 1.21 mg/dL — ABNORMAL HIGH (ref 0.40–1.20)
GFR: 47.81 mL/min — AB (ref >60.00)
Glucose, Bld: 228 mg/dL — ABNORMAL HIGH (ref 70–99)
POTASSIUM: 3.6 meq/L (ref 3.5–5.1)
Sodium: 137 mEq/L (ref 135–145)
Total Bilirubin: 0.4 mg/dL (ref 0.2–1.2)
Total Protein: 7.8 g/dL (ref 6.0–8.3)

## 2016-01-23 LAB — FRUCTOSAMINE: Fructosamine: 363 umol/L — ABNORMAL HIGH (ref 0–285)

## 2016-01-28 ENCOUNTER — Ambulatory Visit (INDEPENDENT_AMBULATORY_CARE_PROVIDER_SITE_OTHER): Payer: BLUE CROSS/BLUE SHIELD | Admitting: Endocrinology

## 2016-01-28 ENCOUNTER — Telehealth: Payer: Self-pay | Admitting: Endocrinology

## 2016-01-28 ENCOUNTER — Encounter: Payer: Self-pay | Admitting: Endocrinology

## 2016-01-28 VITALS — BP 148/84 | HR 116 | Temp 97.8°F | Resp 16 | Ht 63.0 in | Wt 300.8 lb

## 2016-01-28 DIAGNOSIS — Z794 Long term (current) use of insulin: Secondary | ICD-10-CM | POA: Diagnosis not present

## 2016-01-28 DIAGNOSIS — E1165 Type 2 diabetes mellitus with hyperglycemia: Secondary | ICD-10-CM | POA: Diagnosis not present

## 2016-01-28 MED ORDER — VICTOZA 18 MG/3ML ~~LOC~~ SOPN: 1.8000 mg | PEN_INJECTOR | Freq: Every day | SUBCUTANEOUS | Status: DC

## 2016-01-28 MED ORDER — METFORMIN HCL ER 500 MG PO TB24: 2000.0000 mg | ORAL_TABLET | Freq: Every day | ORAL | Status: DC

## 2016-01-28 MED ORDER — INSULIN GLARGINE 300 UNIT/ML ~~LOC~~ SOPN: 66.0000 [IU] | PEN_INJECTOR | Freq: Every day | SUBCUTANEOUS | Status: DC

## 2016-01-28 NOTE — Patient Instructions (Addendum)
Take 66 Lantus until am sugar < 120 then 60 Lantus  Restart metformin, better diet  Victoza 1.8mg  daily  Water exercises  Check blood sugars on waking up 4-5  times a week Also check blood sugars about 2 hours after a meal and do this after different meals by rotation  Recommended blood sugar levels on waking up is 90-130 and about 2 hours after meal is 130-160  Please bring your blood sugar monitor to each visit, thank you

## 2016-01-28 NOTE — Telephone Encounter (Signed)
Patient states Dr Lucianne MussKumar gave her medication Toujeo, is she suppose to stop the lantus? Please advise

## 2016-01-28 NOTE — Progress Notes (Signed)
Patient ID: Melanie Reeves, female   DOB: March 19, 1951, 65 y.o.   MRN: [id number]           Reason for Appointment: Follow-up  for Type 2 Diabetes  Referring physician: Yaakov Guthrie  History of Present Illness:          Date of diagnosis of type 2 diabetes mellitus:  2011       Background history:     She was tested for diabetes when she presented with numbness and tingling in her feet to the health Department. She does not know what her blood sugars were initially  She was however started on both metformin and glipizide at that time. She thinks that in early 2015 she was started on Lantus insulin also developed poor control  Her record indicates A1c levels ranging from 8.2-9.3 and higher at 11.4 in 9/16  Recent history:   INSULIN regimen is: Lantus 60U at bedtime, Novolog 12--12-15 at mealtimes      She was  started on Victoza in December because of continued hyperglycemia and difficulty controlling weight and portions With her renal function deteriorating in 1/17 her Invokana and metformin were stopped Her blood sugars are poorly controlled now with fructosamine significantly high  Current blood sugar patterns and problems identified:  She has been totally noncompliant with checking her blood sugar, watching her diet or doing any exercise and has gained 7 pounds  She has only a few readings at home and lab glucose was over 200 and she has had occasional readings over 300 also in the morning  Unable to review her blood sugars at home as she is using a new meter and has only a couple of readings on this  Oral hypoglycemic drugs the patient is taking are:  None Side effects from medications have been: none  Compliance with the medical regimen: fair Hypoglycemia:  never  Glucose monitoring:  done  1-2  times a day         Glucometer: One Touch.      Blood Glucose readings by recall: upto 350   Self-care: The diet that the patient has been following is: recently not  restricting carbohydrates and fats;   eating more starchy foods and bread     Meal times: Breakfast:10-11 AM Lunch:4 PM Dinner: 7 pm   Typical meal intake: Breakfast is sandwich or biscuit.  Lunch.  Evening meal is meat, vegetables and pasta               Dietician visit, most recent: none                Exercise:  minimal because of pain and discomfort in her legs and dyspnea on exertion  Weight history: Lowest 178 lb several years ago, more recently 300-330  Wt Readings from Last 3 Encounters:  01/28/16 300 lb 12.8 oz (136.442 kg)  12/10/15 293 lb 6.4 oz (133.085 kg)  11/16/15 291 lb (131.997 kg)   Glycemic control:   Lab Results  Component Value Date   HGBA1C 7.4* 12/05/2015   HGBA1C 9.1* 09/17/2015   HGBA1C 11.4* 07/02/2015   Lab Results  Component Value Date   MICROALBUR 0.8 12/05/2015   LDLCALC 104* 09/17/2015   CREATININE 1.21* 01/22/2016    microalbumin/creatinine ratio 11.7     Medication List       This list is accurate as of: 01/28/16  9:55 AM.  Always use your most recent med list.  aspirin 81 MG tablet  Take 81 mg by mouth daily.     atenolol 50 MG tablet  Commonly known as:  TENORMIN  Take 50 mg by mouth every morning.     BAYER CONTOUR NEXT LINK w/Device Kit  Use to test blood sugar daily. Dx: E11.65     BAYER MICROLET LANCETS lancets  Use to test blood sugar 4 times daily as instructed. Dx: E11.65     calcitRIOL 0.25 MCG capsule  Commonly known as:  ROCALTROL  Take 0.25 mcg by mouth daily.     cyclobenzaprine 10 MG tablet  Commonly known as:  FLEXERIL  Take 10 mg by mouth daily.     DULoxetine 30 MG capsule  Commonly known as:  CYMBALTA  Take 30 mg by mouth daily.     Fish Oil 1000 MG Caps  Take 2 capsules by mouth daily.     gabapentin 300 MG capsule  Commonly known as:  NEURONTIN  Take 1 capsule (300 mg total) by mouth 3 (three) times daily.     glucose blood test strip  Commonly known as:  BAYER CONTOUR NEXT  TEST  Use to test blood sugar 4 times daily as instructed. Dx: E11.65     HYDROcodone-acetaminophen 5-325 MG tablet  Commonly known as:  NORCO/VICODIN     Insulin Glargine 300 UNIT/ML Sopn  Commonly known as:  TOUJEO SOLOSTAR  Inject 66 Units into the skin daily.     loratadine 10 MG tablet  Commonly known as:  CLARITIN  Take 10 mg by mouth daily.     metFORMIN 500 MG 24 hr tablet  Commonly known as:  GLUCOPHAGE-XR  Take 4 tablets (2,000 mg total) by mouth daily with supper.     NOVOLOG FLEXPEN 100 UNIT/ML FlexPen  Generic drug:  insulin aspart  INJECT 10 UNITS 3 TIMES A DAY WITH MEALS     VICTOZA 18 MG/3ML Sopn  Generic drug:  Liraglutide  Inject 0.3 mLs (1.8 mg total) into the skin daily. Inject once daily at the same time     Vitamin A & D 5000-400 units Caps  Take by mouth.        Allergies:  Allergies  Allergen Reactions  . Sulfa Antibiotics Other (See Comments)    Cant remember    Past Medical History  Diagnosis Date  . Diabetes mellitus ([place])   . Hypertension   . Hyperlipidemia   . Seasonal allergies   . OSA (obstructive sleep apnea) 08/16/2014  . Morbid obesity [facility])     Past Surgical History  Procedure Laterality Date  . Tonsillectomy    . Cholecystectomy    . Colonoscopy with propofol N/A 11/29/2014    Procedure: COLONOSCOPY WITH PROPOFOL;  Surgeon: Arta Silence, MD;  Location: [facility];  Service: Endoscopy;  Laterality: N/A;    Family History  Problem Relation Age of Onset  . Heart disease Father     MI  . Cancer - Lung Father     Small cell Lung CA    Social History:  reports that she quit smoking about 22 years ago. Her smoking use included Cigarettes. She has a 40 pack-year smoking history. She does not have any smokeless tobacco history on file. She reports that she drinks alcohol. She reports that she does not use illicit drugs.    Review of Systems    Lipid history: She is not on any treatment, followed by PCP LDL over  100 Reportedly has leg weakness from simvastatin.  Currently taking fish oil    Lab Results  Component Value Date   CHOL 175 09/17/2015   HDL 32.20* 09/17/2015   LDLCALC 104* 09/17/2015   TRIG 190.0* 09/17/2015   CHOLHDL 5 09/17/2015         .  Most recent eye exam was In 2016   Hypertension:  On treatment from PCP; currently taking atenolol and  Lasix, enalapril stopped with her renal dysfunction  Has persistent numbness for the last several years.  Occasionally  has sharp twinges of pain also Currently taking Cymbalta and Neurontin She thinks Cymbalta helps her joint pains  Last foot exam in 10/16 showed:  Monofilament sensation is decreased distally on all the toes and plantar surfaces    Physical Examination:  BP 148/84 mmHg  Pulse 116  Temp(Src) 97.8 F (36.6 C)  Resp 16  Ht '5\' 3"'  (1.6 m)  Wt 300 lb 12.8 oz (136.442 kg)  BMI 53.30 kg/m2  SpO2 97%   Trace ankle edema present Has a slow antalgic gait    ASSESSMENT:  Diabetes type 2, uncontrolled with morbid obesity and long-standing poor control See history of present illness for detailed discussion of his current management, blood sugar patterns and problems identified  As discussed above she has worsening of her diabetes control related to both doing poorly on her diet as well as not being able to take Invokana her metformin recently She does not appear to be motivated to take care of her diabetes or check her sugars now Has difficulty exercising because of joint pains  Since renal function is back to normal she can probably start metformin  She may benefit from using [place] and not clear if this is covered by her insurance  PLAN:    Increase VICTOZA up to 1.8 mg  Increase Lantus by 6 units for now and keep morning sugars under 130  Restart metformin ER 2000 mg a day  Start checking blood sugars consistently  Discussed blood sugar targets at various times and need to monitor more consistently  after meals also  Cut back on carbohydrates and high fat foods, consultation with dietitian will be scheduled  Increase exercise with starting water aerobics  Follow-up in 1 month  Call if blood sugars are not consistently controlled, discussed blood sugar targets at various times  Consider restarting 100 mg of Invokana if renal function is stable on the next visit   Patient Instructions  Take 66 Lantus until am sugar < 120 then 60 Lantus  Restart metformin, better diet  Victoza 1.90m daily  Water exercises  Check blood sugars on waking up 4-5  times a week Also check blood sugars about 2 hours after a meal and do this after different meals by rotation  Recommended blood sugar levels on waking up is 90-130 and about 2 hours after meal is 130-160  Please bring your blood sugar monitor to each visit, thank you    Counseling time on subjects discussed above is over 50% of today's 25 minute visit   Melanie Reeves 01/28/2016, 9:55 AM   Note: This office note was prepared with [place] Any transcriptional errors that result from this process are unintentional.

## 2016-01-28 NOTE — Telephone Encounter (Signed)
I called Melanie Reeves to let her know that yes she is suppose to stop the Lantus.

## 2016-02-09 ENCOUNTER — Other Ambulatory Visit: Payer: Self-pay | Admitting: Endocrinology

## 2016-02-20 ENCOUNTER — Other Ambulatory Visit: Payer: BLUE CROSS/BLUE SHIELD

## 2016-02-21 ENCOUNTER — Other Ambulatory Visit (INDEPENDENT_AMBULATORY_CARE_PROVIDER_SITE_OTHER): Payer: BLUE CROSS/BLUE SHIELD

## 2016-02-21 DIAGNOSIS — Z794 Long term (current) use of insulin: Secondary | ICD-10-CM

## 2016-02-21 DIAGNOSIS — E1165 Type 2 diabetes mellitus with hyperglycemia: Secondary | ICD-10-CM

## 2016-02-21 LAB — HEMOGLOBIN A1C: Hgb A1c MFr Bld: 9.8 % — ABNORMAL HIGH (ref 4.6–6.5)

## 2016-02-21 LAB — LDL CHOLESTEROL, DIRECT: Direct LDL: 124 mg/dL

## 2016-02-21 LAB — COMPREHENSIVE METABOLIC PANEL
ALBUMIN: 3.9 g/dL (ref 3.5–5.2)
ALK PHOS: 71 U/L (ref 39–117)
ALT: 32 U/L (ref 0–35)
AST: 31 U/L (ref 0–37)
BILIRUBIN TOTAL: 0.2 mg/dL (ref 0.2–1.2)
BUN: 20 mg/dL (ref 6–23)
CO2: 23 mEq/L (ref 19–32)
Calcium: 9.6 mg/dL (ref 8.4–10.5)
Chloride: 100 mEq/L (ref 96–112)
Creatinine, Ser: 0.91 mg/dL (ref 0.40–1.20)
GFR: 66.41 mL/min (ref >60.00)
GLUCOSE: 222 mg/dL — AB (ref 70–99)
Potassium: 4.6 mEq/L (ref 3.5–5.1)
SODIUM: 135 meq/L (ref 135–145)
TOTAL PROTEIN: 7.5 g/dL (ref 6.0–8.3)

## 2016-02-21 LAB — LIPID PANEL
Cholesterol: 199 mg/dL (ref 0–200)
HDL: 38.3 mg/dL — ABNORMAL LOW (ref >39.00)
NONHDL: 160.7
Total CHOL/HDL Ratio: 5
Triglycerides: 285 mg/dL — ABNORMAL HIGH (ref 0.0–149.0)
VLDL: 57 mg/dL — ABNORMAL HIGH (ref 0.0–40.0)

## 2016-02-22 LAB — FRUCTOSAMINE: Fructosamine: 336 umol/L — ABNORMAL HIGH (ref 0–285)

## 2016-02-25 ENCOUNTER — Ambulatory Visit: Payer: BLUE CROSS/BLUE SHIELD | Admitting: Endocrinology

## 2016-03-14 ENCOUNTER — Encounter: Payer: Self-pay | Admitting: Endocrinology

## 2016-03-14 ENCOUNTER — Ambulatory Visit (INDEPENDENT_AMBULATORY_CARE_PROVIDER_SITE_OTHER): Payer: BLUE CROSS/BLUE SHIELD | Admitting: Endocrinology

## 2016-03-14 VITALS — BP 170/92 | HR 113 | Temp 98.0°F | Resp 18 | Ht 62.0 in | Wt 308.6 lb

## 2016-03-14 DIAGNOSIS — IMO0001 Reserved for inherently not codable concepts without codable children: Secondary | ICD-10-CM

## 2016-03-14 DIAGNOSIS — R110 Nausea: Secondary | ICD-10-CM | POA: Diagnosis not present

## 2016-03-14 DIAGNOSIS — E1165 Type 2 diabetes mellitus with hyperglycemia: Secondary | ICD-10-CM | POA: Diagnosis not present

## 2016-03-14 LAB — POCT GLUCOSE (DEVICE FOR HOME USE): Glucose Fasting, POC: 349 mg/dL — AB (ref 70–99)

## 2016-03-14 NOTE — Patient Instructions (Addendum)
Stop Victoza, Lasix and Metformin  Toujeo 75 units, may need 80 unit  Novolog 20 at meals  Check blood sugars on waking up 4  times a week Also check blood sugars about 2 hours after a meal and do this after different meals by rotation  Recommended blood sugar levels on waking up is 90-130 and about 2 hours after meal is 130-160  Please bring your blood sugar monitor to each visit, thank you

## 2016-03-14 NOTE — Progress Notes (Signed)
Patient ID: Melanie Reeves, female   DOB: 06-10-1951, 65 y.o.   MRN: [id number]           Reason for Appointment: Follow-up  for Type 2 Diabetes  Referring physician: Yaakov Guthrie  History of Present Illness:          Date of diagnosis of type 2 diabetes mellitus:  2011       Background history:     She was tested for diabetes when she presented with numbness and tingling in her feet to the [facility]. She does not know what her blood sugars were initially  She was however started on both metformin and glipizide at that time. She thinks that in early 2015 she was started on Lantus insulin also developed poor control  Her record indicates A1c levels ranging from 8.2-9.3 and higher at 11.4 in 9/16  Recent history:   INSULIN regimen is: Touje 66 U at bedtime, Novolog 12--12-15 at mealtimes      She was  started on Victoza in December because of continued hyperglycemia and difficulty controlling weight and portions With her renal function deteriorating in 1/17 her Invokana and metformin were stopped She was restarted on metformin on her last visit  Blood sugars are still poorly controlled with A1c going up to 9.8% last month  Current blood sugar patterns and problems identified:  Appears to be again totally noncompliant with checking her blood sugar, watching her diet or doing any exercise and has gained another 8 pounds  She now says that she has had nausea, on the last visit was told to try 1.8 mg Victoza but even going back to 1.2 dosage she is still nauseated at times  Blood sugar is significantly high in the office today at 349  Her Toujeo was increased on the last visit but not clear if she is benefiting.  She is drinking regular soft drinks like Coke for relieving her nausea now  Non-insulin hypoglycemic drugs the patient is taking are:  Metformin ER 2000 mg daily, Victoza 1.8 mg daily Side effects from medications have been: none previously  Compliance with  the medical regimen: Poor Hypoglycemia:  never  Glucose monitoring:        Glucometer: One Touch.      Blood Glucose readings: None at home    Self-care: The diet that the patient has been following is: recently not restricting carbohydrates and fats;   eating more starchy foods and bread     Meal times: Breakfast:10-11 AM Lunch:4 PM Dinner: 7 pm   Typical meal intake: Breakfast is sandwich or biscuit.  Lunch.  Evening meal is meat, vegetables and pasta               Dietician visit, most recent: Years ago               Exercise:  minimal because of pain and discomfort in her legs and dyspnea on exertion  Weight history: Lowest 178 lb several years ago, more recently 300-330  Wt Readings from Last 3 Encounters:  03/14/16 308 lb 9.6 oz (139.98 kg)  01/28/16 300 lb 12.8 oz (136.442 kg)  12/10/15 293 lb 6.4 oz (133.085 kg)   Glycemic control:   Lab Results  Component Value Date   HGBA1C 9.8* 02/21/2016   HGBA1C 7.4* 12/05/2015   HGBA1C 9.1* 09/17/2015   Lab Results  Component Value Date   MICROALBUR 0.8 12/05/2015   LDLCALC 104* 09/17/2015   CREATININE 0.91 02/21/2016  microalbumin/creatinine ratio 11.7     Medication List       This list is accurate as of: 03/14/16 11:59 PM.  Always use your most recent med list.               aspirin 81 MG tablet  Take 81 mg by mouth daily.     atenolol 50 MG tablet  Commonly known as:  TENORMIN  Take 50 mg by mouth every morning.     BAYER CONTOUR NEXT LINK w/Device Kit  Use to test blood sugar daily. Dx: E11.65     BAYER MICROLET LANCETS lancets  Use to test blood sugar 4 times daily as instructed. Dx: E11.65     calcitRIOL 0.25 MCG capsule  Commonly known as:  ROCALTROL  Take 0.25 mcg by mouth daily.     cyclobenzaprine 10 MG tablet  Commonly known as:  FLEXERIL  Take 10 mg by mouth daily.     DULoxetine 30 MG capsule  Commonly known as:  CYMBALTA  Take 30 mg by mouth daily.     Fish Oil 1000 MG Caps  Take  2 capsules by mouth daily.     gabapentin 300 MG capsule  Commonly known as:  NEURONTIN  TAKE ONE CAPSULE BY MOUTH 3 TIMES A DAY     glucose blood test strip  Commonly known as:  BAYER CONTOUR NEXT TEST  Use to test blood sugar 4 times daily as instructed. Dx: E11.65     HYDROcodone-acetaminophen 5-325 MG tablet  Commonly known as:  NORCO/VICODIN     Insulin Glargine 300 UNIT/ML Sopn  Commonly known as:  TOUJEO SOLOSTAR  Inject 66 Units into the skin daily.     loratadine 10 MG tablet  Commonly known as:  CLARITIN  Take 10 mg by mouth daily.     metFORMIN 500 MG 24 hr tablet  Commonly known as:  GLUCOPHAGE-XR  Take 4 tablets (2,000 mg total) by mouth daily with supper.     NOVOLOG FLEXPEN 100 UNIT/ML FlexPen  Generic drug:  insulin aspart  INJECT 10 UNITS 3 TIMES A DAY WITH MEALS     VICTOZA 18 MG/3ML Sopn  Generic drug:  Liraglutide  Inject 0.3 mLs (1.8 mg total) into the skin daily. Inject once daily at the same time     Vitamin A & D 5000-400 units Caps  Take by mouth.        Allergies:  Allergies  Allergen Reactions  . Sulfa Antibiotics Other (See Comments)    Cant remember    Past Medical History  Diagnosis Date  . Diabetes mellitus ([place])   . Hypertension   . Hyperlipidemia   . Seasonal allergies   . OSA (obstructive sleep apnea) 08/16/2014  . Morbid obesity [facility])     Past Surgical History  Procedure Laterality Date  . Tonsillectomy    . Cholecystectomy    . Colonoscopy with propofol N/A 11/29/2014    Procedure: COLONOSCOPY WITH PROPOFOL;  Surgeon: Arta Silence, MD;  Location: [facility];  Service: Endoscopy;  Laterality: N/A;    Family History  Problem Relation Age of Onset  . Heart disease Father     MI  . Cancer - Lung Father     Small cell Lung CA    Social History:  reports that she quit smoking about 22 years ago. Her smoking use included Cigarettes. She has a 40 pack-year smoking history. She does not have any smokeless tobacco  history on file. She  reports that she drinks alcohol. She reports that she does not use illicit drugs.    Review of Systems    Lipid history: She is not on any treatment, followed by PCP LDL over 100 Reportedly has leg weakness from simvastatin.   Is taking fish oil    Lab Results  Component Value Date   CHOL 199 02/21/2016   HDL 38.30* 02/21/2016   LDLCALC 104* 09/17/2015   LDLDIRECT 124.0 02/21/2016   TRIG 285.0* 02/21/2016   CHOLHDL 5 02/21/2016         .  Most recent eye exam was In 5/17   Hypertension:  On treatment from PCP; currently taking atenolol and 42m Lasix, enalapril stopped with her renal dysfunction Blood pressure appears to be relatively high today, followed by PCP  Has persistent numbness for the last several years.  Occasionally  has sharp twinges of pain also Currently taking Cymbalta and Neurontin She thinks Cymbalta helps her joint pains  Last foot exam in 10/16 showed:  Monofilament sensation is decreased distally on all the toes and plantar surfaces    Physical Examination:  BP 170/92 mmHg  Pulse 113  Temp(Src) 98 F (36.7 C)  Resp 18  Ht _0  (1.575 m)  Wt 308 lb 9.6 oz (139.98 kg)  BMI 56.43 kg/m2  SpO2 96%  No edema present    ASSESSMENT:  Diabetes type 2, uncontrolled with morbid obesity and long-standing poor control See history of present illness for detailed discussion of his current management, blood sugar patterns and problems identified  As discussed above she has persistently poor control and her A1c is almost 10% now despite taking relatively high amounts of basal insulin, Victoza and metformin along with small doses of Novolog at meals She is noncompliant with checking her blood sugar Also gaining weight likely to be from continued excessive caloric intake despite taking Victoza Not able to exercise She has had recent nausea, previously this was not an issue with Victoza  Has difficulty exercising because of joint  pains  Since renal function is back to normal she can continue metformin and restart Invokana   PLAN:    She will temporarily stop VICTOZA and metformin  Start checking blood sugars daily and at various times including after meals  Discussed blood sugar targets at various times and need to monitor more consistently after meals also  Cut back on carbohydrates and drinks with sugar  Increase exercise with starting water aerobics  Follow-up in 1 month  Call if blood sugars are not consistently controlled, discussed blood sugar targets at various times  Advised her on restarting Invokana 100 mg with continued monitoring of renal function.  At the same time she will need to leave off her Lasix  Increase Toujeo to 75 units and if fasting readings still stay high and a week increase to 80 units  Increase NovoLog 20 units at mealtimes  This may help her blood pressure also   Patient Instructions  Stop Victoza, Lasix and Metformin  Toujeo 75 units, may need 80 unit  Novolog 20 at meals  Check blood sugars on waking up 4  times a week Also check blood sugars about 2 hours after a meal and do this after different meals by rotation  Recommended blood sugar levels on waking up is 90-130 and about 2 hours after meal is 130-160  Please bring your blood sugar monitor to each visit, thank you    Counseling time on subjects discussed above is over  50% of today's 25 minute visit   Azrael Maddix 03/16/2016, 3:39 PM   Note: This office note was prepared with Dragon voice recognition system technology. Any transcriptional errors that result from this process are unintentional.

## 2016-04-14 ENCOUNTER — Other Ambulatory Visit (INDEPENDENT_AMBULATORY_CARE_PROVIDER_SITE_OTHER): Payer: BLUE CROSS/BLUE SHIELD

## 2016-04-14 DIAGNOSIS — E1165 Type 2 diabetes mellitus with hyperglycemia: Secondary | ICD-10-CM

## 2016-04-14 DIAGNOSIS — IMO0001 Reserved for inherently not codable concepts without codable children: Secondary | ICD-10-CM

## 2016-04-14 LAB — BASIC METABOLIC PANEL
BUN: 19 mg/dL (ref 6–23)
CALCIUM: 9.2 mg/dL (ref 8.4–10.5)
CHLORIDE: 105 meq/L (ref 96–112)
CO2: 22 mEq/L (ref 19–32)
CREATININE: 1.16 mg/dL (ref 0.40–1.20)
GFR: 50.16 mL/min — AB (ref >60.00)
Glucose, Bld: 410 mg/dL — ABNORMAL HIGH (ref 70–99)
Potassium: 3.9 mEq/L (ref 3.5–5.1)
Sodium: 134 mEq/L — ABNORMAL LOW (ref 135–145)

## 2016-04-15 LAB — FRUCTOSAMINE: Fructosamine: 411 umol/L — ABNORMAL HIGH (ref 0–285)

## 2016-04-17 ENCOUNTER — Ambulatory Visit (INDEPENDENT_AMBULATORY_CARE_PROVIDER_SITE_OTHER): Payer: BLUE CROSS/BLUE SHIELD | Admitting: Endocrinology

## 2016-04-17 ENCOUNTER — Encounter: Payer: Self-pay | Admitting: Endocrinology

## 2016-04-17 VITALS — BP 126/80 | HR 87 | Wt 309.0 lb

## 2016-04-17 DIAGNOSIS — E1165 Type 2 diabetes mellitus with hyperglycemia: Secondary | ICD-10-CM | POA: Diagnosis not present

## 2016-04-17 DIAGNOSIS — Z794 Long term (current) use of insulin: Secondary | ICD-10-CM

## 2016-04-17 DIAGNOSIS — E1142 Type 2 diabetes mellitus with diabetic polyneuropathy: Secondary | ICD-10-CM | POA: Diagnosis not present

## 2016-04-17 MED ORDER — GABAPENTIN 600 MG PO TABS: 600.0000 mg | ORAL_TABLET | Freq: Three times a day (TID) | ORAL | Status: DC

## 2016-04-17 NOTE — Progress Notes (Signed)
Patient ID: Melanie Reeves, female   DOB: 05/14/1951, 65 y.o.   MRN: [id number]           Reason for Appointment: Follow-up  for Type 2 Diabetes  Referring physician: Yaakov Guthrie  History of Present Illness:          Date of diagnosis of type 2 diabetes mellitus:  2011       Background history:     She was tested for diabetes when she presented with numbness and tingling in her feet to the health Department. She does not know what her blood sugars were initially  She was however started on both metformin and glipizide at that time. She thinks that in early 2015 she was started on Lantus insulin also developed poor control  Her record indicates A1c levels ranging from 8.2-9.3 and higher at 11.4 in 9/16 She had been started on Victoza in 12/16  Recent history:   INSULIN regimen is: Touje 75 U at bedtime, Novolog 20-25 at mealtimes, mostly twice a day      Since she was having significant nausea her Victoza and metformin was stopped but her Invokana was restarted since renal function was normal  Blood sugars are still poorly controlled with fructosamine markedly increased and lab glucose 410 Previously had poor control with A1c going up to 9.8%  Current blood sugar patterns and problems identified:  She thinks she is checking her sugars sometimes in the morning but did not bring her meter  She is doing a little better with cutting back on regular soft drinks which she uses for nausea  However he does not know why her sugar was 410 in the office after lunch  She does tend to forget her Novolog at times  Also thinks that she may sometimes forget her Toujeo in the evening; dose was increased on her last visit  Her weight is about the same despite starting Invokana  She forgets to check her blood sugars after meals including at night despite reminders  Her Victoza and nausea were stopped because of possible relationship and nausea but she still occasionally has episodes of  nausea  Non-insulin hypoglycemic drugs the patient is taking are: Invokana Not on Metformin ER 2000 mg daily, Victoza 1.8 mg   Side effects from medications have been: ?  Nausea from Victoza  Compliance with the medical regimen: Poor Hypoglycemia:  never  Glucose monitoring:        Glucometer: One Touch.      Blood Glucose readings: By recall:  Mean values apply above for all meters except median for One Touch  PRE-MEAL Fasting Lunch Dinner Bedtime Overall  Glucose range: 230-300      Mean/median:        Self-care: The diet that the patient has been following is: None, not restricting carbohydrates and fats;   eating more starchy foods and bread at times    Meal times: Breakfast:10-11 AM Lunch:4 PM Dinner: 7 pm   Typical meal intake: Breakfast is sandwich or biscuit.  Lunch.  Evening meal is meat, vegetables and pasta               Dietician visit, most recent: Years ago               Exercise:  minimal because of pain and discomfort in her legs and dyspnea on exertion  Weight history: Lowest 178 lb several years ago, more recently 300-330  Wt Readings from Last 3 Encounters:  04/17/16 309  lb (140.161 kg)  03/14/16 308 lb 9.6 oz (139.98 kg)  01/28/16 300 lb 12.8 oz (136.442 kg)   Glycemic control:   Lab Results  Component Value Date   HGBA1C 9.8* 02/21/2016   HGBA1C 7.4* 12/05/2015   HGBA1C 9.1* 09/17/2015   Lab Results  Component Value Date   MICROALBUR 0.8 12/05/2015   LDLCALC 104* 09/17/2015   CREATININE 1.16 04/14/2016     Lab on 04/14/2016  Component Date Value Ref Range Status  . Sodium 04/14/2016 134* 135 - 145 mEq/L Final  . Potassium 04/14/2016 3.9  3.5 - 5.1 mEq/L Final  . Chloride 04/14/2016 105  96 - 112 mEq/L Final  . CO2 04/14/2016 22  19 - 32 mEq/L Final  . Glucose, Bld 04/14/2016 410* 70 - 99 mg/dL Final  . BUN 04/14/2016 19  6 - 23 mg/dL Final  . Creatinine, Ser 04/14/2016 1.16  0.40 - 1.20 mg/dL Final  . Calcium 04/14/2016 9.2  8.4 - 10.5  mg/dL Final  . GFR 04/14/2016 50.16* >60.00 mL/min Final  . Fructosamine 04/14/2016 411* 0 - 285 umol/L Final   Comment: Published reference interval for apparently healthy subjects between age 4 and 33 is 34 - 285 umol/L and in a poorly controlled diabetic population is 228 - 563 umol/L with a mean of 396 umol/L.         Medication List       This list is accurate as of: 04/17/16  2:42 PM.  Always use your most recent med list.               aspirin 81 MG tablet  Take 81 mg by mouth daily.     atenolol 50 MG tablet  Commonly known as:  TENORMIN  Take 50 mg by mouth every morning.     BAYER CONTOUR NEXT LINK w/Device Kit  Use to test blood sugar daily. Dx: E11.65     BAYER MICROLET LANCETS lancets  Use to test blood sugar 4 times daily as instructed. Dx: E11.65     calcitRIOL 0.25 MCG capsule  Commonly known as:  ROCALTROL  Take 0.25 mcg by mouth daily.     cyclobenzaprine 10 MG tablet  Commonly known as:  FLEXERIL  Take 10 mg by mouth daily.     DULoxetine 30 MG capsule  Commonly known as:  CYMBALTA  Take 30 mg by mouth daily.     Fish Oil 1000 MG Caps  Take 2 capsules by mouth daily.     gabapentin 600 MG tablet  Commonly known as:  NEURONTIN  Take 1 tablet (600 mg total) by mouth 3 (three) times daily.     glucose blood test strip  Commonly known as:  BAYER CONTOUR NEXT TEST  Use to test blood sugar 4 times daily as instructed. Dx: E11.65     HYDROcodone-acetaminophen 5-325 MG tablet  Commonly known as:  NORCO/VICODIN     Insulin Glargine 300 UNIT/ML Sopn  Commonly known as:  TOUJEO SOLOSTAR  Inject 66 Units into the skin daily.     loratadine 10 MG tablet  Commonly known as:  CLARITIN  Take 10 mg by mouth daily.     NOVOLOG FLEXPEN 100 UNIT/ML FlexPen  Generic drug:  insulin aspart  INJECT 10 UNITS 3 TIMES A DAY WITH MEALS     Vitamin A & D 5000-400 units Caps  Take by mouth.        Allergies:  Allergies  Allergen Reactions  .  Sulfa Antibiotics Other (See Comments)    Cant remember    Past Medical History  Diagnosis Date  . Diabetes mellitus ([facility])   . Hypertension   . Hyperlipidemia   . Seasonal allergies   . OSA (obstructive sleep apnea) 08/16/2014  . Morbid obesity [facility])     Past Surgical History  Procedure Laterality Date  . Tonsillectomy    . Cholecystectomy    . Colonoscopy with propofol N/A 11/29/2014    Procedure: COLONOSCOPY WITH PROPOFOL;  Surgeon: Willis Modena, MD;  Location: [facility];  Service: Endoscopy;  Laterality: N/A;    Family History  Problem Relation Age of Onset  . Heart disease Father     MI  . Cancer - Lung Father     Small cell Lung CA    Social History:  reports that she quit smoking about 22 years ago. Her smoking use included Cigarettes. She has a 40 pack-year smoking history. She does not have any smokeless tobacco history on file. She reports that she drinks alcohol. She reports that she does not use illicit drugs.    Review of Systems    Lipid history: She is not on any treatment, followed by PCP LDL over 100 Reportedly has leg weakness from simvastatin.   Is taking fish oil    Lab Results  Component Value Date   CHOL 199 02/21/2016   HDL 38.30* 02/21/2016   LDLCALC 104* 09/17/2015   LDLDIRECT 124.0 02/21/2016   TRIG 285.0* 02/21/2016   CHOLHDL 5 02/21/2016         .  Most recent eye exam was In 5/17   Hypertension:  On treatment from PCP; currently taking atenolol Lasix was stopped with starting Invokana Blood pressure Is better today now  Has persistent numbness for the last several years.  Occasionally  has sharp twinges of pain also Has parasthesia and jerking off legs especially at night Currently taking Cymbalta and Neurontin, she is taking 600 mg twice a day of Neurontin and believes she needs a higher dose for better control She thinks Cymbalta helps her joint pains  Last foot exam in 10/16 showed:  Monofilament sensation is decreased  distally on all the toes and plantar surfaces    Physical Examination:  BP 126/80 mmHg  Pulse 87  Wt 309 lb (140.161 kg)  SpO2 94%     ASSESSMENT:  Diabetes type 2, uncontrolled with morbid obesity and long-standing poor control See history of present illness for detailed discussion of his current management, blood sugar patterns and problems identified  As discussed above she has persistently poor control and difficulty with compliance Blood sugars are possibly higher with stopping Victoza and metformin This is despite taking Invokana and she is not losing weight with this No side effects with Invokana and renal function is stable Most likely has higher postprandial readings also which she does not monitor, partly from not taking Novolog with every meal  Since renal function is back to normal she can restart metformin  She will restart Victoza as below   PLAN:    She will change her Toujeo to the morning  She will increase frequency of glucose monitoring especially at bedtime  More consistent diet with avoiding high-fat foods and regular soft drinks  Increase Toujeo to  to 80 units  Increase NovoLog by 5-10 units at mealtimes  Start Victoza with 0.6 mg daily if tolerated increase to 0.9  New prescription for gabapentin 600 mg 3 times a day. consultation with  diabetes educator   Patient Instructions  Toujeo 80 in ams  Must take Novolog at meals, if sugar after meals over 200 go to to 25-30 Novolog  Victoza 0.'6mg'$  daily, after 1 week dial 5 clicks more  Metformin 1 pill with each meal  Check blood sugars on waking up 3-4  times a week Also check blood sugars about 2-3 hours after a meal and do this after different meals by rotation  Recommended blood sugar levels on waking up is 90-130 and about 2 hours after meal is 130-160  Please bring your blood sugar monitor to each visit, thank you    Counseling time on subjects discussed above is over 50% of today's  25 minute visit   Jevan Gaunt 04/17/2016, 2:42 PM   Note: This office note was prepared with Dragon voice recognition system technology. Any transcriptional errors that result from this process are unintentional.

## 2016-04-17 NOTE — Patient Instructions (Addendum)
Toujeo 80 in ams  Must take Novolog at meals, if sugar after meals over 200 go to to 25-30 Novolog  Victoza 0.6mg  daily, after 1 week dial 5 clicks more  Metformin 1 pill with each meal  Check blood sugars on waking up 3-4  times a week Also check blood sugars about 2-3 hours after a meal and do this after different meals by rotation  Recommended blood sugar levels on waking up is 90-130 and about 2 hours after meal is 130-160  Please bring your blood sugar monitor to each visit, thank you

## 2016-04-30 ENCOUNTER — Encounter: Payer: BLUE CROSS/BLUE SHIELD | Admitting: Nutrition

## 2016-05-05 ENCOUNTER — Other Ambulatory Visit: Payer: Self-pay

## 2016-05-07 ENCOUNTER — Telehealth: Payer: Self-pay | Admitting: Endocrinology

## 2016-05-07 ENCOUNTER — Telehealth: Payer: Self-pay

## 2016-05-07 ENCOUNTER — Other Ambulatory Visit: Payer: Self-pay | Admitting: Endocrinology

## 2016-05-07 MED ORDER — CANAGLIFLOZIN 100 MG PO TABS: ORAL_TABLET | ORAL | 3 refills | Status: DC

## 2016-05-07 NOTE — Telephone Encounter (Signed)
Prescription sent, was previously prescribed

## 2016-05-07 NOTE — Telephone Encounter (Signed)
Patient requesting Invokana rx to be sent to pharmacy; I do not see that on patient med list. Would you like to send it in? Please advise. Thank you.

## 2016-05-07 NOTE — Telephone Encounter (Signed)
invokana rx needs to be called in to [facility] in [place]

## 2016-05-12 ENCOUNTER — Other Ambulatory Visit: Payer: Self-pay

## 2016-05-13 ENCOUNTER — Other Ambulatory Visit: Payer: Self-pay | Admitting: Endocrinology

## 2016-05-14 NOTE — Telephone Encounter (Signed)
Spoke to pharmacy.  Patient must have keyed in old RX number.  They have New RX on file.

## 2016-05-23 ENCOUNTER — Other Ambulatory Visit: Payer: Self-pay

## 2016-05-23 MED ORDER — INSULIN GLARGINE 300 UNIT/ML ~~LOC~~ SOPN: 75.0000 [IU] | PEN_INJECTOR | Freq: Every day | SUBCUTANEOUS | 2 refills | Status: DC

## 2016-05-28 ENCOUNTER — Other Ambulatory Visit (INDEPENDENT_AMBULATORY_CARE_PROVIDER_SITE_OTHER): Payer: BLUE CROSS/BLUE SHIELD

## 2016-05-28 DIAGNOSIS — E1165 Type 2 diabetes mellitus with hyperglycemia: Secondary | ICD-10-CM | POA: Diagnosis not present

## 2016-05-28 DIAGNOSIS — Z794 Long term (current) use of insulin: Secondary | ICD-10-CM | POA: Diagnosis not present

## 2016-05-28 LAB — COMPREHENSIVE METABOLIC PANEL
ALT: 28 U/L (ref 0–35)
AST: 29 U/L (ref 0–37)
Albumin: 3.9 g/dL (ref 3.5–5.2)
Alkaline Phosphatase: 78 U/L (ref 39–117)
BUN: 20 mg/dL (ref 6–23)
CALCIUM: 9.1 mg/dL (ref 8.4–10.5)
CHLORIDE: 103 meq/L (ref 96–112)
CO2: 24 meq/L (ref 19–32)
Creatinine, Ser: 1.02 mg/dL (ref 0.40–1.20)
GFR: 58.17 mL/min — AB (ref >60.00)
Glucose, Bld: 240 mg/dL — ABNORMAL HIGH (ref 70–99)
POTASSIUM: 3.4 meq/L — AB (ref 3.5–5.1)
Sodium: 140 mEq/L (ref 135–145)
Total Bilirubin: 0.3 mg/dL (ref 0.2–1.2)
Total Protein: 7.6 g/dL (ref 6.0–8.3)

## 2016-05-28 LAB — HEMOGLOBIN A1C: Hgb A1c MFr Bld: 10.6 % — ABNORMAL HIGH (ref 4.6–6.5)

## 2016-05-29 ENCOUNTER — Other Ambulatory Visit: Payer: BLUE CROSS/BLUE SHIELD

## 2016-06-04 ENCOUNTER — Ambulatory Visit: Payer: BLUE CROSS/BLUE SHIELD | Admitting: Endocrinology

## 2016-06-24 ENCOUNTER — Encounter: Payer: Self-pay | Admitting: Endocrinology

## 2016-06-24 ENCOUNTER — Ambulatory Visit (INDEPENDENT_AMBULATORY_CARE_PROVIDER_SITE_OTHER): Payer: BLUE CROSS/BLUE SHIELD | Admitting: Endocrinology

## 2016-06-24 VITALS — BP 122/74 | HR 90 | Temp 97.9°F | Resp 16 | Ht 62.0 in | Wt 304.0 lb

## 2016-06-24 DIAGNOSIS — E1165 Type 2 diabetes mellitus with hyperglycemia: Secondary | ICD-10-CM | POA: Diagnosis not present

## 2016-06-24 DIAGNOSIS — Z794 Long term (current) use of insulin: Secondary | ICD-10-CM

## 2016-06-24 NOTE — Progress Notes (Signed)
Patient ID: Melanie Reeves, female   DOB: [birth year], 65 y.o.   MRN: [id number]           Reason for Appointment: Follow-up  for Type 2 Diabetes  Referring physician: Yaakov Guthrie  History of Present Illness:          Date of diagnosis of type 2 diabetes mellitus:  2011       Background history:     She was tested for diabetes when she presented with numbness and tingling in her feet to the health Department. She does not know what her blood sugars were initially  She was however started on both metformin and glipizide at that time. She thinks that in early 2015 she was started on Lantus insulin also developed poor control  Her record indicates A1c levels ranging from 8.2-9.3 and higher at 11.4 in 9/16 She had been started on Victoza in 12/16  Recent history:   INSULIN regimen is: Toujeo 80U at bedtime, Novolog  25-28 at mealtimes, mostly twice a day      Since she was having difficulty with blood sugar control with insulin and Invokana alone she was told to start back on Victoza and metformin  Blood sugars are still poorly controlled as of 8/17 with A1c going up to 10.6  Current blood sugar patterns and problems identified:  She has a few blood sugars on her monitor recently which are significantly better although is checking occasionally in the mornings and afternoons only Again not doing any readings after evening meal  She is doing much better recently with improving her diet and cutting out regular soft drinks, right food, unhealthy snacks and eating out at places like [place]  She has not had any nausea on this visit from taking Victoza and has gone up to 1.2 mg  COMPLIANCE with her insulin, Victoza has improved more significantly in the last month as she is getting more motivated.  Previously was not taking her injections especially NovoLog regularly  No change in renal function with continuing Invokana  She forgets to check her blood sugars after meals including at  night despite reminders  TOUJEO was increased to 80 units on the last visit and she has no hypoglycemia with this  Non-insulin hypoglycemic drugs the patient is taking are: Invokana,  Metformin ER 2000 mg daily, Victoza 1.2 mg   Side effects from medications have been: ?  Nausea from Victoza  Compliance with the medical regimen: Improving Hypoglycemia:  never  Glucose monitoring:        Glucometer: Contour      Blood Glucose readings: By download:  Mean values apply above for all meters except median for One Touch  PRE-MEAL Fasting Lunch Dinner Bedtime Overall  Glucose range: 132-141   138, 155     Mean/median:         Self-care: The diet that the patient has been following is: None, not restricting carbohydrates and fats;   eating more starchy foods and bread at times    Meal times: Breakfast:10-11 AM Lunch:4 PM Dinner: 7 pm   Typical meal intake: Breakfast is sandwich or biscuit.  Lunch.  Evening meal is meat, vegetables and pasta               Dietician visit, most recent: Years ago               Exercise:  minimal because of pain and discomfort in her legs and dyspnea on exertion, trying Wii  Weight history: Lowest 178 lb several years ago, more recently 300-330  Wt Readings from Last 3 Encounters:  06/24/16 (!) 304 lb (137.9 kg)  04/17/16 (!) 309 lb (140.2 kg)  03/14/16 (!) 308 lb 9.6 oz (140 kg)   Glycemic control:   Lab Results  Component Value Date   HGBA1C 10.6 (H) 05/28/2016   HGBA1C 9.8 (H) 02/21/2016   HGBA1C 7.4 (H) 12/05/2015   Lab Results  Component Value Date   MICROALBUR 0.8 12/05/2015   LDLCALC 104 (H) 09/17/2015   CREATININE 1.02 05/28/2016      No visits with results within 1 Week(s) from this visit.  Latest known visit with results is:  Lab on 05/28/2016  Component Date Value Ref Range Status  . Sodium 05/28/2016 140  135 - 145 mEq/L Final  . Potassium 05/28/2016 3.4* 3.5 - 5.1 mEq/L Final  . Chloride 05/28/2016 103  96 - 112 mEq/L Final   . CO2 05/28/2016 24  19 - 32 mEq/L Final  . Glucose, Bld 05/28/2016 240* 70 - 99 mg/dL Final  . BUN 05/28/2016 20  6 - 23 mg/dL Final  . Creatinine, Ser 05/28/2016 1.02  0.40 - 1.20 mg/dL Final  . Total Bilirubin 05/28/2016 0.3  0.2 - 1.2 mg/dL Final  . Alkaline Phosphatase 05/28/2016 78  39 - 117 U/L Final  . AST 05/28/2016 29  0 - 37 U/L Final  . ALT 05/28/2016 28  0 - 35 U/L Final  . Total Protein 05/28/2016 7.6  6.0 - 8.3 g/dL Final  . Albumin 05/28/2016 3.9  3.5 - 5.2 g/dL Final  . Calcium 05/28/2016 9.1  8.4 - 10.5 mg/dL Final  . GFR 05/28/2016 58.17* >60.00 mL/min Final  . Hgb A1c MFr Bld 05/28/2016 10.6* 4.6 - 6.5 % Final        Medication List       Accurate as of 06/24/16  1:01 PM. Always use your most recent med list.          aspirin 81 MG tablet Take 81 mg by mouth daily.   atenolol 50 MG tablet Commonly known as:  TENORMIN Take 50 mg by mouth every morning.   BAYER CONTOUR NEXT LINK w/Device Kit Use to test blood sugar daily. Dx: E11.65   BAYER MICROLET LANCETS lancets Use to test blood sugar 4 times daily as instructed. Dx: E11.65   calcitRIOL 0.25 MCG capsule Commonly known as:  ROCALTROL Take 0.25 mcg by mouth daily.   canagliflozin 100 MG Tabs tablet Commonly known as:  INVOKANA 1 tablet before breakfast   cyclobenzaprine 10 MG tablet Commonly known as:  FLEXERIL Take 10 mg by mouth daily.   DULoxetine 30 MG capsule Commonly known as:  CYMBALTA Take 30 mg by mouth daily.   Fish Oil 1000 MG Caps Take 2 capsules by mouth daily.   gabapentin 600 MG tablet Commonly known as:  NEURONTIN Take 1 tablet (600 mg total) by mouth 3 (three) times daily.   glucose blood test strip Commonly known as:  BAYER CONTOUR NEXT TEST Use to test blood sugar 4 times daily as instructed. Dx: E11.65   HYDROcodone-acetaminophen 5-325 MG tablet Commonly known as:  NORCO/VICODIN   Insulin Glargine 300 UNIT/ML Sopn Commonly known as:  TOUJEO SOLOSTAR Inject  75 Units into the skin daily.   latanoprost 0.005 % ophthalmic solution Commonly known as:  XALATAN 1 drop at bedtime.   loratadine 10 MG tablet Commonly known as:  CLARITIN Take 10 mg by mouth  daily.   NOVOLOG FLEXPEN 100 UNIT/ML FlexPen Generic drug:  insulin aspart INJECT 10 UNITS 3 TIMES A DAY WITH MEALS   Vitamin A & D 5000-400 units Caps Take by mouth.       Allergies:  Allergies  Allergen Reactions  . Sulfa Antibiotics Other (See Comments)    Cant remember    Past Medical History:  Diagnosis Date  . Diabetes mellitus ([place])   . Hyperlipidemia   . Hypertension   . Morbid obesity ([place])   . OSA (obstructive sleep apnea) 08/16/2014  . Seasonal allergies     Past Surgical History:  Procedure Laterality Date  . CHOLECYSTECTOMY    . COLONOSCOPY WITH PROPOFOL N/A 11/29/2014   Procedure: COLONOSCOPY WITH PROPOFOL;  Surgeon: Arta Silence, MD;  Location: [facility];  Service: Endoscopy;  Laterality: N/A;  . TONSILLECTOMY      Family History  Problem Relation Age of Onset  . Heart disease Father     MI  . Cancer - Lung Father     Small cell Lung CA    Social History:  reports that she quit smoking about 22 years ago. Her smoking use included Cigarettes. She has a 40.00 pack-year smoking history. She does not have any smokeless tobacco history on file. She reports that she drinks alcohol. She reports that she does not use drugs.    Review of Systems    Lipid history:  She is not on any treatment, followed by PCP LDL over 100, checked by PCP, last level was 124  Reportedly has leg weakness from simvastatin.   Is taking fish oil    Lab Results  Component Value Date   CHOL 199 02/21/2016   HDL 38.30 (L) 02/21/2016   LDLCALC 104 (H) 09/17/2015   LDLDIRECT 124.0 02/21/2016   TRIG 285.0 (H) 02/21/2016   CHOLHDL 5 02/21/2016         .  Most recent eye exam was In 5/17   Hypertension:  On treatment from PCP; currently taking atenolol Lasix was reduced  with Invokana, on 57m for 2 months Blood pressure Is better today now  NEUROPATHY: Has persistent numbness for in her feet the last several years.  Occasionally  has sharp twinges of pain  Has parasthesia and jerking off legs especially at night  Currently taking Cymbalta and Neurontin, she is taking 600 mg 1am and 1 1/2 hs a day of Neurontin with relief of  neuropathic symptoms, does get a little off balance with taking gabapentin   She thinks Cymbalta helps her joint pains  Last foot exam in 10/16 showed:  Monofilament sensation is decreased distally on all the toes and plantar surfaces    Physical Examination:  BP 122/74   Pulse 90   Temp 97.9 F (36.6 C)   Resp 16   Ht 5' 2" (1.575 m)   Wt (!) 304 lb (137.9 kg)   BMI 55.60 kg/m      ASSESSMENT:  Diabetes type 2,  with morbid obesity and long-standing poor control See history of present illness for detailed discussion of  current management, blood sugar patterns and problems identified  As discussed above she has had persistently poor control and difficulty with compliance However even though her A1c was over 10% last month she appears to have improved her compliance significantly and her blood sugar levels at home in the last few days are near normal Not clear what her blood sugars are after evening meal She was tolerating Victoza now  and probably benefiting from adding metformin also  No side effects with Invokana and renal function is stable With improving her diet significantly and also trying to be a little more active she is starting to lose weight again  History of HYPERLIPIDEMIA, not controlled and followed by PCP  She has mild HYPOKALEMIA as of last month from continuing to take Lasix which she was told to stop when taking Invokana   PLAN:    She will continue her Toujeo 80 units in the morning  She will increase frequency of glucose monitoring with more emphasis on readings after meals  Continue to  improve diet and work on exercise for weight loss, she needs to make sure she has some protein with each meal  Discussed blood sugar targets at various times  She will continue Victoza, Invokana and metformin unchanged  Need follow-up A1c in 2 months  Consider consultation with dietitian for more formal meal planning  Bring monitor on each visit for download   For her non- diabetes problems:  She will discuss treatment of hyperlipidemia with PCP when she has her physical  Try to take Lasix every other day to avoid hypokalemia, will need follow-up with-him  Continue same dose of gabapentin along with Cymbalta for neuropathy  Patient Instructions  Check blood sugars on waking up  2-3 x per week  Also check blood sugars about 2 hours after a meal and do this after different meals by rotation  Recommended blood sugar levels on waking up is 90-130 and about 2 hours after meal is 130-160  Please bring your blood sugar monitor to each visit, thank you  Lasix every 2 days    Counseling time on subjects discussed above is over 50% of today's 25 minute visit   , 06/24/2016, 1:01 PM   Note: This office note was prepared with Estate agent. Any transcriptional errors that result from this process are unintentional.

## 2016-06-24 NOTE — Patient Instructions (Addendum)
Check blood sugars on waking up  2-3 x per week  Also check blood sugars about 2 hours after a meal and do this after different meals by rotation  Recommended blood sugar levels on waking up is 90-130 and about 2 hours after meal is 130-160  Please bring your blood sugar monitor to each visit, thank you  Lasix every 2 days

## 2016-07-04 ENCOUNTER — Other Ambulatory Visit: Payer: Self-pay | Admitting: Surgery

## 2016-07-05 ENCOUNTER — Other Ambulatory Visit: Payer: Self-pay | Admitting: Endocrinology

## 2016-07-17 ENCOUNTER — Ambulatory Visit (HOSPITAL_COMMUNITY)
Admission: EM | Admit: 2016-07-17 | Discharge: 2016-07-17 | Disposition: A | Discharge status: Home / Self Care | Payer: BLUE CROSS/BLUE SHIELD | Attending: Internal Medicine | Admitting: Internal Medicine

## 2016-07-17 ENCOUNTER — Encounter (HOSPITAL_COMMUNITY): Payer: Self-pay | Admitting: Emergency Medicine

## 2016-07-17 DIAGNOSIS — Z23 Encounter for immunization: Secondary | ICD-10-CM

## 2016-07-17 DIAGNOSIS — S61211A Laceration without foreign body of left index finger without damage to nail, initial encounter: Secondary | ICD-10-CM | POA: Diagnosis not present

## 2016-07-17 DIAGNOSIS — R55 Syncope and collapse: Secondary | ICD-10-CM | POA: Diagnosis not present

## 2016-07-17 MED ORDER — TETANUS-DIPHTH-ACELL PERTUSSIS 5-2.5-18.5 LF-MCG/0.5 IM SUSP
0.5000 mL | Freq: Once | INTRAMUSCULAR | Status: AC
  Administered 2016-07-17: 0.5 mL via INTRAMUSCULAR

## 2016-07-17 MED ORDER — TETANUS-DIPHTH-ACELL PERTUSSIS 5-2.5-18.5 LF-MCG/0.5 IM SUSP
INTRAMUSCULAR | Status: AC
  Filled 2016-07-17: qty 0.5

## 2016-07-17 NOTE — Discharge Instructions (Signed)
This wound did not require stitches or other type of closure. A material was placed on the wound to help control drainage. On top of that a thick bulky dressing was applied. Keep this dressing on until tomorrow and may gently remove the dressing and cleaned with cool running water. Then apply another dressing. If there is no bleeding in May apply a snug Band-Aid if there is some bleeding or probably need to apply another bulky dressing. Return for any problems or persistent bleeding. Keep the wound dry until you change the dressing tomorrow.

## 2016-07-17 NOTE — ED Provider Notes (Signed)
CSN: [id number]     Arrival date & time 07/17/16  1006 History   First MD Initiated Contact with Patient 07/17/16 1053     Chief Complaint  Patient presents with  . Laceration    left 2nd finger   (Consider location/radiation/quality/duration/timing/severity/associated sxs/prior Treatment) 65 year old female was cutting vegetables this morning with a new knife and accidentally cut her left index finger. The knife produced a tangent avulsion to the skin of the distal phalanx. The primary reason she came to the urgent care was that she was unable to control the bleeding.      Past Medical History:  Diagnosis Date  . Diabetes mellitus ([place])   . Hyperlipidemia   . Hypertension   . Morbid obesity ([place])   . OSA (obstructive sleep apnea) 08/16/2014  . Seasonal allergies    Past Surgical History:  Procedure Laterality Date  . CHOLECYSTECTOMY    . COLONOSCOPY WITH PROPOFOL N/A 11/29/2014   Procedure: COLONOSCOPY WITH PROPOFOL;  Surgeon: Arta Silence, MD;  Location: [facility];  Service: Endoscopy;  Laterality: N/A;  . TONSILLECTOMY     Family History  Problem Relation Age of Onset  . Heart disease Father     MI  . Cancer - Lung Father     Small cell Lung CA   Social History  Substance Use Topics  . Smoking status: Former Smoker    Packs/day: 2.00    Years: 20.00    Types: Cigarettes    Quit date: 11/22/1993  . Smokeless tobacco: Never Used  . Alcohol use 0.0 oz/week     Comment: Occasional   OB History    No data available     Review of Systems  Constitutional: Negative.   HENT: Negative.   Respiratory: Negative.   Cardiovascular: Negative for chest pain.  Gastrointestinal: Negative.   Skin: Positive for wound.  All other systems reviewed and are negative.   Allergies  Sulfa antibiotics  Home Medications   Prior to Admission medications   Medication Sig Start Date End Date Taking? Authorizing Provider  atenolol (TENORMIN) 50 MG tablet Take 50 mg by mouth  every morning.    Yes Historical Provider, MD  canagliflozin (INVOKANA) 100 MG TABS tablet 1 tablet before breakfast 05/07/16  Yes Elayne Snare, MD  cyclobenzaprine (FLEXERIL) 10 MG tablet Take 10 mg by mouth daily.    Yes Historical Provider, MD  furosemide (LASIX) 20 MG tablet Take 20 mg by mouth.   Yes Historical Provider, MD  gabapentin (NEURONTIN) 600 MG tablet Take 1 tablet (600 mg total) by mouth 3 (three) times daily. 04/17/16  Yes Elayne Snare, MD  Insulin Glargine (TOUJEO SOLOSTAR) 300 UNIT/ML SOPN Inject 75 Units into the skin daily. Patient taking differently: Inject 80 Units into the skin daily.  05/23/16  Yes Elayne Snare, MD  loratadine (CLARITIN) 10 MG tablet Take 10 mg by mouth daily.   Yes Historical Provider, MD  metFORMIN (GLUCOPHAGE) 500 MG tablet Take 500 mg by mouth 2 (two) times daily with a meal.   Yes Historical Provider, MD  NOVOLOG FLEXPEN 100 UNIT/ML FlexPen INJECT 10 UNITS 3 TIMES A DAY WITH MEALS Patient taking differently: INJECT 25-28 UNITS 3 TIMES A DAY WITH MEALS 01/07/16  Yes Elayne Snare, MD  Omega-3 Fatty Acids (FISH OIL) 1000 MG CAPS Take 2 capsules by mouth daily.   Yes Historical Provider, MD  aspirin 81 MG tablet Take 81 mg by mouth daily.    Historical Provider, MD  BAYER MICROLET LANCETS  lancets Use to test blood sugar 4 times daily as instructed. Dx: E11.65 10/26/15   Elayne Snare, MD  Blood Glucose Monitoring Suppl (BAYER CONTOUR NEXT LINK) w/Device KIT Use to test blood sugar daily. Dx: E11.65 10/26/15   Elayne Snare, MD  calcitRIOL (ROCALTROL) 0.25 MCG capsule Take 0.25 mcg by mouth daily.    Historical Provider, MD  DULoxetine (CYMBALTA) 30 MG capsule Take 30 mg by mouth daily.    Historical Provider, MD  glucose blood (BAYER CONTOUR NEXT TEST) test strip Use to test blood sugar 4 times daily as instructed. Dx: E11.65 10/26/15   Elayne Snare, MD  HYDROcodone-acetaminophen (NORCO/VICODIN) 5-325 MG tablet  10/09/15   Historical Provider, MD  latanoprost (XALATAN) 0.005 %  ophthalmic solution 1 drop at bedtime.    Historical Provider, MD  TOUJEO SOLOSTAR 300 UNIT/ML SOPN INJECT 66 UNITS INTO THE SKIN DAILY. 07/07/16   Elayne Snare, MD  Vitamins A & D (VITAMIN A & D) 5000-400 UNITS CAPS Take by mouth.    Historical Provider, MD   Meds Ordered and Administered this Visit   Medications  Tdap (BOOSTRIX) injection 0.5 mL (not administered)    BP 137/79 (BP Location: Right Arm)   Pulse 93   Temp 98.3 F (36.8 C) (Oral)   Resp 20   SpO2 97%  No data found.   Physical Exam  Constitutional: She is oriented to person, place, and time. She appears well-developed and well-nourished. No distress.  HENT:  Head: Normocephalic and atraumatic.  Neck: Neck supple.  Cardiovascular: Normal rate.   Pulmonary/Chest: Effort normal.  Musculoskeletal: Normal range of motion. She exhibits no edema or deformity.  Neurological: She is alert and oriented to person, place, and time.  Skin: Skin is warm and dry. No erythema.  There is a 3 x 2 cm superficial dermal avulsion in a longitudinal fashion to the left index  finger medial aspect. Function of the digit is intact. Distal neurovascular motor Sentry is intact. No involvement of the joint or nail.  Psychiatric: She has a normal mood and affect.  Nursing note and vitals reviewed.   Urgent Care Course   Clinical Course    .Marland KitchenLaceration Repair Date/Time: 07/17/2016 11:20 AM Performed by: Marcha Dutton, Kelton Bultman Authorized by: Sherlene Shams   Consent:    Consent obtained:  Verbal   Consent given by:  Patient   Risks discussed:  Infection Anesthesia (see MAR for exact dosages):    Anesthesia method:  None Laceration details:    Location:  Finger   Finger location:  L index finger   Length (cm):  2   Depth (mm):  1 Repair type:    Repair type:  Simple Treatment:    Area cleansed with:  Saline   Amount of cleaning:  Standard   Irrigation solution:  Sterile saline   Irrigation method:  Pressure wash   Visualized foreign  bodies/material removed: no   Comments:     The wound required no closure. After the wound was irrigated and cleaned and Surgicel was placed directly on the avulsion and then a bulky sterile dressing applied. During this rather quick procedure the patient began to feel fainty. She was placed supine and flat. She felt clammy and was breathing heavy. Denied chest pain heaviness tightness fullness. There was no loss of consciousness. After a minute or so lying flat she stated she was feeling better and her blood pressure was 126/85. Heart rate 84. After applying the Surgicel bulky sterile dressing was applied  for hemostasis and protection.   (including critical care time)  Labs Review Labs Reviewed - No data to display  Imaging Review No results found.   Visual Acuity Review  Right Eye Distance:   Left Eye Distance:   Bilateral Distance:    Right Eye Near:   Left Eye Near:    Bilateral Near:         MDM   1. Laceration of left index finger without foreign body without damage to nail, initial encounter    This wound did not require stitches or other type of closure. A material was placed on the wound to help control drainage. On top of that a thick bulky dressing was applied. Keep this dressing on until tomorrow and may gently remove the dressing and cleaned with cool running water. Then apply another dressing. If there is no bleeding in May apply a snug Band-Aid if there is some bleeding or probably need to apply another bulky dressing. Return for any problems or persistent bleeding. Keep the wound dry until you change the dressing tomorrow. Meds ordered this encounter  Medications  . metFORMIN (GLUCOPHAGE) 500 MG tablet    Sig: Take 500 mg by mouth 2 (two) times daily with a meal.  . furosemide (LASIX) 20 MG tablet    Sig: Take 20 mg by mouth.  . Tdap (BOOSTRIX) injection 0.5 mL       Janne Napoleon, NP 07/17/16 1126

## 2016-07-17 NOTE — ED Triage Notes (Signed)
Pt cut her left 2nd finger with a knife while cutting celery.  Pt states she hasn't been able to get the bleeding to stop and that is why she is here.  Pt has the wound dressed with pressure because of the bleeding.

## 2016-08-16 ENCOUNTER — Other Ambulatory Visit: Payer: Self-pay | Admitting: Endocrinology

## 2016-08-25 ENCOUNTER — Other Ambulatory Visit: Payer: BLUE CROSS/BLUE SHIELD

## 2016-08-25 ENCOUNTER — Other Ambulatory Visit: Payer: Self-pay | Admitting: Endocrinology

## 2016-08-28 ENCOUNTER — Ambulatory Visit: Payer: BLUE CROSS/BLUE SHIELD | Admitting: Endocrinology

## 2016-09-08 ENCOUNTER — Other Ambulatory Visit: Payer: BLUE CROSS/BLUE SHIELD

## 2016-09-11 ENCOUNTER — Ambulatory Visit: Payer: BLUE CROSS/BLUE SHIELD | Admitting: Endocrinology

## 2016-09-12 ENCOUNTER — Other Ambulatory Visit: Payer: BLUE CROSS/BLUE SHIELD

## 2016-09-16 ENCOUNTER — Ambulatory Visit: Payer: BLUE CROSS/BLUE SHIELD | Admitting: Endocrinology

## 2016-09-26 ENCOUNTER — Other Ambulatory Visit: Payer: Self-pay | Admitting: Endocrinology

## 2016-10-02 ENCOUNTER — Emergency Department (HOSPITAL_COMMUNITY)
Admission: EM | Admit: 2016-10-02 | Discharge: 2016-10-02 | Disposition: A | Discharge status: Home / Self Care | Payer: BLUE CROSS/BLUE SHIELD | Attending: Emergency Medicine | Admitting: Emergency Medicine

## 2016-10-02 ENCOUNTER — Encounter (HOSPITAL_COMMUNITY): Payer: Self-pay

## 2016-10-02 DIAGNOSIS — I10 Essential (primary) hypertension: Secondary | ICD-10-CM | POA: Insufficient documentation

## 2016-10-02 DIAGNOSIS — Z7982 Long term (current) use of aspirin: Secondary | ICD-10-CM | POA: Insufficient documentation

## 2016-10-02 DIAGNOSIS — Z79899 Other long term (current) drug therapy: Secondary | ICD-10-CM | POA: Diagnosis not present

## 2016-10-02 DIAGNOSIS — Z794 Long term (current) use of insulin: Secondary | ICD-10-CM | POA: Diagnosis not present

## 2016-10-02 DIAGNOSIS — I493 Ventricular premature depolarization: Secondary | ICD-10-CM

## 2016-10-02 DIAGNOSIS — Z87891 Personal history of nicotine dependence: Secondary | ICD-10-CM | POA: Diagnosis not present

## 2016-10-02 DIAGNOSIS — R002 Palpitations: Secondary | ICD-10-CM | POA: Diagnosis present

## 2016-10-02 DIAGNOSIS — E119 Type 2 diabetes mellitus without complications: Secondary | ICD-10-CM | POA: Diagnosis not present

## 2016-10-02 LAB — BASIC METABOLIC PANEL
ANION GAP: 11 (ref 5–15)
BUN: 13 mg/dL (ref 6–20)
CHLORIDE: 104 mmol/L (ref 101–111)
CO2: 23 mmol/L (ref 22–32)
Calcium: 9.2 mg/dL (ref 8.9–10.3)
Creatinine, Ser: 1.01 mg/dL — ABNORMAL HIGH (ref 0.44–1.00)
GFR calc non Af Amer: 58 mL/min — ABNORMAL LOW (ref >60)
Glucose, Bld: 249 mg/dL — ABNORMAL HIGH (ref 65–99)
Potassium: 3.9 mmol/L (ref 3.5–5.1)
SODIUM: 138 mmol/L (ref 135–145)

## 2016-10-02 LAB — CBC WITH DIFFERENTIAL/PLATELET
BASOS PCT: 0 %
Basophils Absolute: 0 10*3/uL (ref 0.0–0.1)
Eosinophils Absolute: 0.2 10*3/uL (ref 0.0–0.7)
Eosinophils Relative: 2 %
HEMATOCRIT: 38.5 % (ref 36.0–46.0)
HEMOGLOBIN: 12.5 g/dL (ref 12.0–15.0)
LYMPHS ABS: 3.4 10*3/uL (ref 0.7–4.0)
Lymphocytes Relative: 24 %
MCH: 28.3 pg (ref 26.0–34.0)
MCHC: 32.5 g/dL (ref 30.0–36.0)
MCV: 87.1 fL (ref 78.0–100.0)
MONOS PCT: 4 %
Monocytes Absolute: 0.5 10*3/uL (ref 0.1–1.0)
NEUTROS ABS: 9.7 10*3/uL — AB (ref 1.7–7.7)
NEUTROS PCT: 70 %
Platelets: 251 10*3/uL (ref 150–400)
RBC: 4.42 MIL/uL (ref 3.87–5.11)
RDW: 14.2 % (ref 11.5–15.5)
WBC: 13.9 10*3/uL — AB (ref 4.0–10.5)

## 2016-10-02 MED ORDER — ATENOLOL 50 MG PO TABS
50.0000 mg | ORAL_TABLET | Freq: Once | ORAL | Status: AC
  Administered 2016-10-02: 50 mg via ORAL
  Filled 2016-10-02: qty 1

## 2016-10-02 NOTE — ED Triage Notes (Signed)
[facility]- pt coming from PCP office with c/o palpitations. Pt was hypertensive and tachycardic at PCP. Pt reports having palpitations last night and intermittently today. Pt has also not been taking her home meds X1 week.

## 2016-10-02 NOTE — Discharge Instructions (Signed)
Is important for you to take your medications as prescribed, this is likely the reason for your symptoms. Please take your atenolol as prescribed. Follow-up with your PCP or cardiologist for reevaluation. Return to ED for new or worsening symptoms as we discussed.

## 2016-10-02 NOTE — ED Provider Notes (Signed)
[facility] DEPT Provider Note   CSN: [id number] Arrival date & time: 10/02/16  1221     History   Chief Complaint Chief Complaint  Patient presents with  . Palpitations    HPI Melanie Reeves is a 65 y.o. female.  HPI here for evaluation of palpitations. Patient reports earlier this morning at 1:00 AM, she felt "heart racing and flopping around" she reports associated mild shortness of breath and fatigue. She reports the symptoms abated, but then she had an appointment at her PCPs office this morning and her symptoms returned and she was referred to ED via EMS. She denies any fevers, chills, chest pain, leg swelling. She also admits that she has not been taking any of her medications over the past 1 week "because I'm stubborn".  Past Medical History:  Diagnosis Date  . Diabetes mellitus ([place])   . Hyperlipidemia   . Hypertension   . Morbid obesity ([place])   . OSA (obstructive sleep apnea) 08/16/2014  . Seasonal allergies     Patient Active Problem List   Diagnosis Date Noted  . OSA (obstructive sleep apnea) 08/16/2014  . Abnormal electrocardiogram 08/14/2014  . Essential hypertension 08/14/2014  . Dyspnea 08/14/2014  . Morbid obesity ([place]) 08/14/2014  . [place] DEGENERATION 12/20/2009  . SPINAL STENOSIS 12/20/2009  . LOW BACK PAIN 12/20/2009    Past Surgical History:  Procedure Laterality Date  . CHOLECYSTECTOMY    . COLONOSCOPY WITH PROPOFOL N/A 11/29/2014   Procedure: COLONOSCOPY WITH PROPOFOL;  Surgeon: Arta Silence, MD;  Location: [facility];  Service: Endoscopy;  Laterality: N/A;  . TONSILLECTOMY      OB History    No data available       Home Medications    Prior to Admission medications   Medication Sig Start Date End Date Taking? Authorizing Provider  aspirin 81 MG tablet Take 81 mg by mouth daily.   Yes Historical Provider, MD  atenolol (TENORMIN) 50 MG tablet Take 50 mg by mouth every morning.    Yes Historical Provider, MD  calcitRIOL  (ROCALTROL) 0.25 MCG capsule Take 0.25 mcg by mouth daily.   Yes Historical Provider, MD  cyclobenzaprine (FLEXERIL) 10 MG tablet Take 10 mg by mouth daily.    Yes Historical Provider, MD  DULoxetine (CYMBALTA) 30 MG capsule Take 30 mg by mouth daily.   Yes Historical Provider, MD  furosemide (LASIX) 20 MG tablet Take 20 mg by mouth.   Yes Historical Provider, MD  gabapentin (NEURONTIN) 600 MG tablet TAKE 1 TABLET (600 MG TOTAL) BY MOUTH 3 (THREE) TIMES DAILY. Patient taking differently: Take 900-1,200 mg by mouth 2 (two) times daily. 957m n the morning and 12019mat night 08/18/16  Yes AjElayne SnareMD  HYDROcodone-acetaminophen (NORCO/VICODIN) 5-325 MG tablet  10/09/15  Yes Historical Provider, MD  Insulin Glargine (TOUJEO SOLOSTAR) 300 UNIT/ML SOPN Inject 75 Units into the skin daily. Patient taking differently: Inject 80 Units into the skin daily.  05/23/16  Yes AjElayne SnareMD  INVOKANA 100 MG TABS tablet TAKE 1 TABLET BY MOUTH EVERY MORNING BEFORE BREAKFAST 08/25/16  Yes AjElayne SnareMD  latanoprost (XALATAN) 0.005 % ophthalmic solution Place 1 drop into both eyes at bedtime.    Yes Historical Provider, MD  loratadine (CLARITIN) 10 MG tablet Take 10 mg by mouth daily.   Yes Historical Provider, MD  metFORMIN (GLUCOPHAGE-XR) 500 MG 24 hr tablet TAKE 4 TABLETS (2,000 MG TOTAL) BY MOUTH DAILY WITH SUPPER. 08/25/16  Yes AjElayne SnareMD  NOVOLOG FLEXPEN 100 UNIT/ML FlexPen INJECT 10 UNITS 3 TIMES A DAY WITH MEALS Patient taking differently: INJECT 35 UNITS 3 TIMES A DAY WITH MEALS 01/07/16  Yes Ajay Kumar, MD  Omega-3 Fatty Acids (FISH OIL) 1000 MG CAPS Take 2 capsules by mouth daily.   Yes Historical Provider, MD  VICTOZA 18 MG/3ML SOPN INJECT 0.3 MLS (1.8 MG TOTAL) INTO THE SKIN DAILY. INJECT ONCE DAILY AT THE SAME TIME Patient taking differently: Inject 1.2 mg into the skin daily. Inject once daily at the same time 09/26/16  Yes Ajay Kumar, MD  Vitamins A & D (VITAMIN A & D) 5000-400 UNITS CAPS Take  by mouth.   Yes Historical Provider, MD  BAYER MICROLET LANCETS lancets Use to test blood sugar 4 times daily as instructed. Dx: E11.65 10/26/15   Ajay Kumar, MD  Blood Glucose Monitoring Suppl (BAYER CONTOUR NEXT LINK) w/Device KIT Use to test blood sugar daily. Dx: E11.65 10/26/15   Ajay Kumar, MD  TOUJEO SOLOSTAR 300 UNIT/ML SOPN INJECT 66 UNITS INTO THE SKIN DAILY. Patient not taking: Reported on 10/02/2016 07/07/16   Ajay Kumar, MD    Family History Family History  Problem Relation Age of Onset  . Heart disease Father     MI  . Cancer - Lung Father     Small cell Lung CA    Social History Social History  Substance Use Topics  . Smoking status: Former Smoker    Packs/day: 2.00    Years: 20.00    Types: Cigarettes    Quit date: 11/22/1993  . Smokeless tobacco: Never Used  . Alcohol use 0.0 oz/week     Comment: Occasional     Allergies   Sulfa antibiotics   Review of Systems Review of Systems A complete review of systems was completed and was negative except for pertinent positives and negatives as mentioned in the history of present illness    Physical Exam Updated Vital Signs BP 126/73   Pulse 73   Temp 97.9 F (36.6 C) (Oral)   Resp 15   SpO2 95%   Physical Exam  Constitutional: She appears well-developed. No distress.  Awake, alert and nontoxic in appearance. Morbidly obese  HENT:  Head: Normocephalic and atraumatic.  Right Ear: External ear normal.  Left Ear: External ear normal.  Mouth/Throat: Oropharynx is clear and moist.  Eyes: Conjunctivae and EOM are normal. Pupils are equal, round, and reactive to light.  Neck: Normal range of motion. No JVD present.  Cardiovascular: Regular rhythm and normal heart sounds.   Mild tachycardia  Pulmonary/Chest: Effort normal and breath sounds normal. No stridor.  Abdominal: Soft. There is no tenderness.  Musculoskeletal: Normal range of motion.  Neurological:  Awake, alert, cooperative and aware of situation;  motor strength bilaterally; sensation normal to light touch bilaterally; no facial asymmetry; tongue midline; major cranial nerves appear intact;  baseline gait without new ataxia.  Skin: No rash noted. She is not diaphoretic.  Psychiatric: She has a normal mood and affect. Her behavior is normal. Thought content normal.  Nursing note and vitals reviewed.    ED Treatments / Results  Labs (all labs ordered are listed, but only abnormal results are displayed) Labs Reviewed  BASIC METABOLIC PANEL - Abnormal; Notable for the following:       Result Value   Glucose, Bld 249 (*)    Creatinine, Ser 1.01 (*)    GFR calc non Af Amer 58 (*)    All other components within normal   limits  CBC WITH DIFFERENTIAL/PLATELET - Abnormal; Notable for the following:    WBC 13.9 (*)    Neutro Abs 9.7 (*)    All other components within normal limits    EKG  EKG Interpretation  Date/Time:  Thursday October 02 2016 14:31:31 EST Ventricular Rate:  94 PR Interval:    QRS Duration: 153 QT Interval:  390 QTC Calculation: 488 R Axis:   -79 Text Interpretation:  Sinus tachycardia Atrial premature complexes Nonspecific IVCD with LAD Left ventricular hypertrophy Similar to prior EKG  Confirmed by LIU MD, Hinton Dyer ([id number]) on 10/02/2016 2:41:32 PM       Radiology No results found.  Procedures Procedures (including critical care time)  Medications Ordered in ED Medications  atenolol (TENORMIN) tablet 50 mg (50 mg Oral Given 10/02/16 1333)     Initial Impression / Assessment and Plan / ED Course  I have reviewed the triage vital signs and the nursing notes.  Pertinent labs & imaging results that were available during my care of the patient were reviewed by me and considered in my medical decision making (see chart for details).  Clinical Course     While talking with the patient, she converts into apparent atrial dysrhythmia. EKG shows PACs as well as PVCs. Given 50 mg home dose of atenolol. Upon  reevaluation at 1430 p.m., patient is resting comfortably in no apparent distress. Repeat EKG is improving. Stressed importance of taking home medications. Suspect this is source of patient's symptoms-medication noncompliance-not ACS or other emergent cardio pulmonary pathology. Follow-up with cardiology outpatient. Appropriate for discharge. Strict return precautions discussed. No evidence of other emergent pathology at this time Prior to patient discharge, I discussed and reviewed this case with Dr.Liu     Final Clinical Impressions(s) / ED Diagnoses   Final diagnoses:  Palpitations  PVC (premature ventricular contraction)    New Prescriptions New Prescriptions   No medications on file     Comer Locket, PA-C 10/02/16 [place] Liu, MD 10/03/16 1108

## 2016-10-02 NOTE — ED Notes (Signed)
Pt ambulated to and from the restroom without difficulty

## 2016-10-02 NOTE — ED Notes (Signed)
[facility] IV removed and replacement started in the right forearm.

## 2016-10-02 NOTE — Discharge Planning (Signed)
Pt up for discharge. EDCM reviewed chart for possible CM needs.  No needs identified or communicated.  

## 2016-11-25 ENCOUNTER — Other Ambulatory Visit: Payer: Self-pay

## 2016-11-25 MED ORDER — METFORMIN HCL ER 500 MG PO TB24: 2000.0000 mg | ORAL_TABLET | Freq: Every day | ORAL | 1 refills | Status: DC

## 2016-12-24 ENCOUNTER — Other Ambulatory Visit: Payer: Self-pay

## 2016-12-24 MED ORDER — METFORMIN HCL ER 500 MG PO TB24: 2000.0000 mg | ORAL_TABLET | Freq: Every day | ORAL | 1 refills | Status: DC

## 2017-01-19 ENCOUNTER — Other Ambulatory Visit: Payer: Self-pay | Admitting: Endocrinology

## 2017-01-28 ENCOUNTER — Other Ambulatory Visit: Payer: BLUE CROSS/BLUE SHIELD

## 2017-01-29 ENCOUNTER — Ambulatory Visit: Payer: BLUE CROSS/BLUE SHIELD | Admitting: Endocrinology

## 2017-01-30 ENCOUNTER — Other Ambulatory Visit (INDEPENDENT_AMBULATORY_CARE_PROVIDER_SITE_OTHER): Payer: BLUE CROSS/BLUE SHIELD

## 2017-01-30 DIAGNOSIS — E1165 Type 2 diabetes mellitus with hyperglycemia: Secondary | ICD-10-CM | POA: Diagnosis not present

## 2017-01-30 DIAGNOSIS — Z794 Long term (current) use of insulin: Secondary | ICD-10-CM

## 2017-01-30 LAB — COMPREHENSIVE METABOLIC PANEL
ALBUMIN: 4.1 g/dL (ref 3.5–5.2)
ALK PHOS: 66 U/L (ref 39–117)
ALT: 27 U/L (ref 0–35)
AST: 27 U/L (ref 0–37)
BILIRUBIN TOTAL: 0.3 mg/dL (ref 0.2–1.2)
BUN: 23 mg/dL (ref 6–23)
CO2: 22 mEq/L (ref 19–32)
CREATININE: 1.03 mg/dL (ref 0.40–1.20)
Calcium: 9.6 mg/dL (ref 8.4–10.5)
Chloride: 104 mEq/L (ref 96–112)
GFR: 57.39 mL/min — ABNORMAL LOW (ref >60.00)
Glucose, Bld: 179 mg/dL — ABNORMAL HIGH (ref 70–99)
Potassium: 4 mEq/L (ref 3.5–5.1)
SODIUM: 137 meq/L (ref 135–145)
TOTAL PROTEIN: 8.3 g/dL (ref 6.0–8.3)

## 2017-01-30 LAB — HEMOGLOBIN A1C: Hgb A1c MFr Bld: 8.1 % — ABNORMAL HIGH (ref 4.6–6.5)

## 2017-02-03 ENCOUNTER — Ambulatory Visit: Payer: BLUE CROSS/BLUE SHIELD | Admitting: Endocrinology

## 2017-02-17 ENCOUNTER — Other Ambulatory Visit: Payer: Self-pay | Admitting: Endocrinology

## 2017-02-18 ENCOUNTER — Ambulatory Visit (INDEPENDENT_AMBULATORY_CARE_PROVIDER_SITE_OTHER): Payer: BLUE CROSS/BLUE SHIELD | Admitting: Endocrinology

## 2017-02-18 ENCOUNTER — Encounter: Payer: Self-pay | Admitting: Endocrinology

## 2017-02-18 VITALS — BP 152/94 | HR 87 | Ht 62.0 in | Wt 286.8 lb

## 2017-02-18 DIAGNOSIS — I10 Essential (primary) hypertension: Secondary | ICD-10-CM

## 2017-02-18 DIAGNOSIS — E1165 Type 2 diabetes mellitus with hyperglycemia: Secondary | ICD-10-CM

## 2017-02-18 DIAGNOSIS — E1142 Type 2 diabetes mellitus with diabetic polyneuropathy: Secondary | ICD-10-CM

## 2017-02-18 DIAGNOSIS — E782 Mixed hyperlipidemia: Secondary | ICD-10-CM | POA: Diagnosis not present

## 2017-02-18 DIAGNOSIS — Z794 Long term (current) use of insulin: Secondary | ICD-10-CM

## 2017-02-18 NOTE — Progress Notes (Addendum)
Patient ID: Melanie Reeves, female   DOB: [birth year], 66 y.o.   MRN: [id number]           Reason for Appointment: Follow-up  for Type 2 Diabetes  Referring physician: Yaakov Guthrie  History of Present Illness:          Date of diagnosis of type 2 diabetes mellitus:  2011       Background history:     She was tested for diabetes when she presented with numbness and tingling in her feet to the health Department. She does not know what her blood sugars were initially  She was however started on both metformin and glipizide at that time. She thinks that in early 2015 she was started on Lantus insulin also developed poor control  Her record indicates A1c levels ranging from 8.2-9.3 and higher at 11.4 in 9/16 She had been started on Victoza in 12/16  Recent history:   INSULIN regimen is: Toujeo 80 U at bedtime, no Novolog  Non-insulin hypoglycemic drugs the patient is taking are: Invokana,  Metformin ER 2000 mg daily, Victoza 1.2 mg   She has not been seen in follow-up for several months now Previously A1c was going up to 10.6 and in April is down to 8.1  Current blood sugar patterns and problems identified:  She says that over the last 2 months or so she has significantly changed her diet with cutting back on portions, carbohydrates, sweets, high-fat foods and sweets drinks.  She thinks she has lost 30 pounds but on our scales she has lost only 18  However her blood sugars are looking much better overall  She is only checking readings fasting lately and only a couple of readings later in the day, also had a reading of 179 in the afternoon in the lab  Since she went on her new diet she has stopped taking Novolog  However she thinks she is taking her Victoza and Invokana regularly  TOUJEO has been continued at 80 units  She does not think she feels hypoglycemic at any time   Side effects from medications have been: ?  Nausea from Victoza  Compliance with the medical regimen:  Improving Hypoglycemia:  never  Glucose monitoring:        Glucometer: Contour      Blood Glucose readings: By download:  Mean values apply above for all meters except median for One Touch  PRE-MEAL Fasting Lunch Dinner Bedtime Overall  Glucose range: 94-126   174   Mean/median: 116    121   Self-care: The diet that the patient has been following is: None, not restricting carbohydrates and fats;   eating more starchy foods and bread at times    Meal times: Breakfast:10-11 AM Lunch:4 PM Dinner: 7 pm   Typical meal intake: Breakfast is eggs/ low fat meat..  Lunch.  Evening meal is meat, vegetables, rare sweets               Dietician visit, most recent: Years ago               Exercise:  minimal because of pain and discomfort in her legs and dyspnea on exertion, trying Wii  Weight history: Lowest 178 lb several years ago, more recently 300-330  Wt Readings from Last 3 Encounters:  02/18/17 286 lb 12.8 oz (130.1 kg)  06/24/16 (!) 304 lb (137.9 kg)  04/17/16 (!) 309 lb (140.2 kg)   Glycemic control:   Lab Results  Component  Value Date   HGBA1C 8.1 (H) 01/30/2017   HGBA1C 10.6 (H) 05/28/2016   HGBA1C 9.8 (H) 02/21/2016   Lab Results  Component Value Date   MICROALBUR 0.8 12/05/2015   LDLCALC 104 (H) 09/17/2015   CREATININE 1.03 01/30/2017      No visits with results within 1 Week(s) from this visit.  Latest known visit with results is:  Lab on 01/30/2017  Component Date Value Ref Range Status  . Hgb A1c MFr Bld 01/30/2017 8.1* 4.6 - 6.5 % Final   Glycemic Control Guidelines for People with Diabetes:Non Diabetic:  <6%Goal of Therapy: <7%Additional Action Suggested:  >8%   . Sodium 01/30/2017 137  135 - 145 mEq/L Final  . Potassium 01/30/2017 4.0  3.5 - 5.1 mEq/L Final  . Chloride 01/30/2017 104  96 - 112 mEq/L Final  . CO2 01/30/2017 22  19 - 32 mEq/L Final  . Glucose, Bld 01/30/2017 179* 70 - 99 mg/dL Final  . BUN 01/30/2017 23  6 - 23 mg/dL Final  . Creatinine, Ser  01/30/2017 1.03  0.40 - 1.20 mg/dL Final  . Total Bilirubin 01/30/2017 0.3  0.2 - 1.2 mg/dL Final  . Alkaline Phosphatase 01/30/2017 66  39 - 117 U/L Final  . AST 01/30/2017 27  0 - 37 U/L Final  . ALT 01/30/2017 27  0 - 35 U/L Final  . Total Protein 01/30/2017 8.3  6.0 - 8.3 g/dL Final  . Albumin 01/30/2017 4.1  3.5 - 5.2 g/dL Final  . Calcium 01/30/2017 9.6  8.4 - 10.5 mg/dL Final  . GFR 01/30/2017 57.39* >60.00 mL/min Final      Allergies as of 02/18/2017      Reactions   Sulfa Antibiotics Other (See Comments)   Cant remember      Medication List       Accurate as of 02/18/17 11:59 PM. Always use your most recent med list.          aspirin 81 MG tablet Take 81 mg by mouth daily.   atenolol 50 MG tablet Commonly known as:  TENORMIN Take 50 mg by mouth every morning.   BAYER CONTOUR NEXT LINK w/Device Kit Use to test blood sugar daily. Dx: E11.65   BAYER MICROLET LANCETS lancets Use to test blood sugar 4 times daily as instructed. Dx: E11.65   calcitRIOL 0.25 MCG capsule Commonly known as:  ROCALTROL Take 0.25 mcg by mouth daily.   cyclobenzaprine 10 MG tablet Commonly known as:  FLEXERIL Take 10 mg by mouth daily.   DULoxetine 30 MG capsule Commonly known as:  CYMBALTA Take 30 mg by mouth daily.   Fish Oil 1000 MG Caps Take 2 capsules by mouth daily.   furosemide 20 MG tablet Commonly known as:  LASIX Take 20 mg by mouth.   gabapentin 600 MG tablet Commonly known as:  NEURONTIN TAKE 1 TABLET (600 MG TOTAL) BY MOUTH 3 (THREE) TIMES DAILY.   HYDROcodone-acetaminophen 5-325 MG tablet Commonly known as:  NORCO/VICODIN   Insulin Glargine 300 UNIT/ML Sopn Commonly known as:  TOUJEO SOLOSTAR Inject 75 Units into the skin daily.   INVOKANA 100 MG Tabs tablet Generic drug:  canagliflozin TAKE 1 TABLET BY MOUTH EVERY MORNING BEFORE BREAKFAST   latanoprost 0.005 % ophthalmic solution Commonly known as:  XALATAN Place 1 drop into both eyes at bedtime.     loratadine 10 MG tablet Commonly known as:  CLARITIN Take 10 mg by mouth daily.   metFORMIN 500 MG 24 hr  tablet Commonly known as:  GLUCOPHAGE-XR Take 4 tablets (2,000 mg total) by mouth daily with supper.   NOVOLOG FLEXPEN 100 UNIT/ML FlexPen Generic drug:  insulin aspart INJECT 10 UNITS 3 TIMES A DAY WITH MEALS   VICTOZA 18 MG/3ML Sopn Generic drug:  liraglutide INJECT 0.3 MLS (1.8 MG TOTAL) INTO THE SKIN DAILY. INJECT ONCE DAILY AT THE SAME TIME   Vitamin A & D 5000-400 units Caps Take by mouth.       Allergies:  Allergies  Allergen Reactions  . Sulfa Antibiotics Other (See Comments)    Cant remember    Past Medical History:  Diagnosis Date  . Diabetes mellitus ([place])   . Hyperlipidemia   . Hypertension   . Morbid obesity ([place])   . OSA (obstructive sleep apnea) 08/16/2014  . Seasonal allergies     Past Surgical History:  Procedure Laterality Date  . CHOLECYSTECTOMY    . COLONOSCOPY WITH PROPOFOL N/A 11/29/2014   Procedure: COLONOSCOPY WITH PROPOFOL;  Surgeon: Arta Silence, MD;  Location: [facility];  Service: Endoscopy;  Laterality: N/A;  . TONSILLECTOMY      Family History  Problem Relation Age of Onset  . Heart disease Father        MI  . Cancer - Lung Father        Small cell Lung CA    Social History:  reports that she quit smoking about 23 years ago. Her smoking use included Cigarettes. She has a 40.00 pack-year smoking history. She has quit using smokeless tobacco. She reports that she drinks alcohol. She reports that she does not use drugs.    Review of Systems    Lipid history:  She is not on any treatment, followed by PCP LDL over 100, checked by PCP, last level was 124  Reportedly has leg weakness from simvastatin.      Lab Results  Component Value Date   CHOL 199 02/21/2016   HDL 38.30 (L) 02/21/2016   LDLCALC 104 (H) 09/17/2015   LDLDIRECT 124.0 02/21/2016   TRIG 285.0 (H) 02/21/2016   CHOLHDL 5 02/21/2016         .  Most  recent eye exam was In 5/17   Hypertension:  On treatment from PCP; taking atenolol Blood pressure is relatively high today  NEUROPATHY: Has persistent numbness for in her feet the last several years.  Also has sharp twinges of pain  Has parasthesia and jerking off legs especially at night  Currently taking Cymbalta and Neurontin She takes 1200 mg twice a day of gabapentin and wants to continue taking this However she is complaining about more pain during the day  Last foot exam in 5/18 showed:  Monofilament sensation is decreased distally on all the toes and distal plantar surfaces    Physical Examination:  BP (!) 152/94   Pulse 87   Ht _0  (1.575 m)   Wt 286 lb 12.8 oz (130.1 kg)   SpO2 97%   BMI 52.46 kg/m      Diabetic Foot Exam - Simple   Simple Foot Form Diabetic Foot exam was performed with the following findings:  Yes 02/18/2017  3:57 PM  Visual Inspection No deformities, no ulcerations, no other skin breakdown bilaterally:  Yes Sensation Testing See comments:  Yes Pulse Check See comments:  Yes Comments Monofilament sensation decreased in distal toes and plantar surfaces, mostly on the right Pedal pulses not palpable on the right, left dorsalis pedis pulse is normal  ASSESSMENT:  Diabetes type 2,  with morbid obesity and long-standing poor control See history of present illness for detailed discussion of  current management, blood sugar patterns and problems identified  She has changed her diet significantly and lost weight with resulting improvement in her blood sugar control also Recent A1c 8.1 compared to over 10% last year Although she is not taking any mealtime insulin she still has some postprandial hyperglycemia Discussed that even with taking Victoza and Invokana she does need some mealtime insulin coverage  History of HYPERLIPIDEMIA, not  recently evaluated and needs follow-up with PCP  HYPERTENSION: Also needs follow-up with  PCP  NEUROPATHY: She is having more symptoms but is on maximum dose of gabapentin  PLAN:    She will continue her Toujeo 80 units in the morning, may need to reduce dose if fasting blood sugars are low normal   She will  start checking readings more after meals and less in the morning to help identify when she has postprandial hyperglycemia  Meanwhile she can at least start 10 units of NovoLog with meals.have any carbohydrate, either lunch or supper  Continue to increase activity  Discussed blood sugar targets fasting and postprandial    For her non- diabetes problems:  She will discuss treatment of hyperlipidemia with PCP   Also follow-up for her blood pressure   He can try taking 600 mg gabapentin at breakfast, 600 at lunch and 1200 in the evening  Gen. foot care principles discussed  Patient Instructions  Check blood sugars on waking up  4x weekly  Also check blood sugars about 2 hours after a meal and do this after different meals by rotation  Recommended blood sugar levels on waking up is 90-130 and about 2 hours after meal is 130-160  Please bring your blood sugar monitor to each visit, thank you  Novolog 10 units with any meal with Carbs  Gabapentin 600 at Bfst and lunch        Counseling time on subjects discussed above is over 50% of today's 25 minute visit   Lynnell Fiumara 02/19/2017, 7:57 AM   Note: This office note was prepared with Dragon voice recognition system technology. Any transcriptional errors that result from this process are unintentional.

## 2017-02-18 NOTE — Patient Instructions (Addendum)
Check blood sugars on waking up  4x weekly  Also check blood sugars about 2 hours after a meal and do this after different meals by rotation  Recommended blood sugar levels on waking up is 90-130 and about 2 hours after meal is 130-160  Please bring your blood sugar monitor to each visit, thank you  Novolog 10 units with any meal with Carbs  Gabapentin 600 at Bfst and lunch

## 2017-02-19 NOTE — Addendum Note (Signed)
Addended by: Reather LittlerKUMAR, Anagabriela Jokerst on: 02/19/2017 08:00 AM   Modules accepted: Orders

## 2017-02-26 ENCOUNTER — Other Ambulatory Visit: Payer: Self-pay

## 2017-02-26 MED ORDER — VICTOZA 18 MG/3ML ~~LOC~~ SOPN: 1.8000 mg | PEN_INJECTOR | Freq: Every day | SUBCUTANEOUS | 3 refills | Status: DC

## 2017-03-04 ENCOUNTER — Other Ambulatory Visit: Payer: Self-pay

## 2017-03-04 MED ORDER — VICTOZA 18 MG/3ML ~~LOC~~ SOPN: 1.8000 mg | PEN_INJECTOR | Freq: Every day | SUBCUTANEOUS | 3 refills | Status: DC

## 2017-03-11 ENCOUNTER — Telehealth: Payer: Self-pay | Admitting: Endocrinology

## 2017-03-11 NOTE — Telephone Encounter (Signed)
Please advise. Thank you

## 2017-03-11 NOTE — Telephone Encounter (Signed)
Patient called to advise that she accidentally just took 60 units of trudjeo above her normal dosage. She wants to make sure that she does not need to take further action at this time. Please call her back @ cell #

## 2017-03-11 NOTE — Telephone Encounter (Signed)
She needs to check her blood sugars about every 3-4 hours to see if they are dropping including overnight.  She will need to have extra carbohydrate snacks if blood sugars are getting below 100

## 2017-03-12 NOTE — Telephone Encounter (Signed)
Called patient and gave her the message. She stated that she called her pharmacy also yesterday. She said overall her blood sugars stayed good all day.

## 2017-05-19 ENCOUNTER — Other Ambulatory Visit: Payer: Self-pay | Admitting: Endocrinology

## 2017-05-21 ENCOUNTER — Other Ambulatory Visit (INDEPENDENT_AMBULATORY_CARE_PROVIDER_SITE_OTHER): Payer: BLUE CROSS/BLUE SHIELD

## 2017-05-21 DIAGNOSIS — E1165 Type 2 diabetes mellitus with hyperglycemia: Secondary | ICD-10-CM | POA: Diagnosis not present

## 2017-05-21 DIAGNOSIS — Z794 Long term (current) use of insulin: Secondary | ICD-10-CM | POA: Diagnosis not present

## 2017-05-21 LAB — MICROALBUMIN / CREATININE URINE RATIO
Creatinine,U: 83.4 mg/dL
Microalb Creat Ratio: 2.8 mg/g (ref 0.0–30.0)
Microalb, Ur: 2.3 mg/dL — ABNORMAL HIGH (ref 0.0–1.9)

## 2017-05-21 LAB — COMPREHENSIVE METABOLIC PANEL
ALK PHOS: 66 U/L (ref 39–117)
ALT: 27 U/L (ref 0–35)
AST: 25 U/L (ref 0–37)
Albumin: 4 g/dL (ref 3.5–5.2)
BUN: 28 mg/dL — AB (ref 6–23)
CO2: 26 mEq/L (ref 19–32)
CREATININE: 0.99 mg/dL (ref 0.40–1.20)
Calcium: 9.5 mg/dL (ref 8.4–10.5)
Chloride: 100 mEq/L (ref 96–112)
GFR: 60.02 mL/min (ref >60.00)
GLUCOSE: 175 mg/dL — AB (ref 70–99)
POTASSIUM: 4.1 meq/L (ref 3.5–5.1)
SODIUM: 137 meq/L (ref 135–145)
TOTAL PROTEIN: 8.1 g/dL (ref 6.0–8.3)
Total Bilirubin: 0.3 mg/dL (ref 0.2–1.2)

## 2017-05-21 LAB — HEMOGLOBIN A1C: HEMOGLOBIN A1C: 8.2 % — AB (ref 4.6–6.5)

## 2017-05-25 ENCOUNTER — Ambulatory Visit (INDEPENDENT_AMBULATORY_CARE_PROVIDER_SITE_OTHER): Payer: BLUE CROSS/BLUE SHIELD | Admitting: Endocrinology

## 2017-05-25 ENCOUNTER — Other Ambulatory Visit: Payer: BLUE CROSS/BLUE SHIELD

## 2017-05-25 ENCOUNTER — Encounter: Payer: Self-pay | Admitting: Endocrinology

## 2017-05-25 VITALS — BP 138/88 | HR 91 | Ht 62.0 in | Wt 296.4 lb

## 2017-05-25 DIAGNOSIS — Z794 Long term (current) use of insulin: Secondary | ICD-10-CM | POA: Diagnosis not present

## 2017-05-25 DIAGNOSIS — E1165 Type 2 diabetes mellitus with hyperglycemia: Secondary | ICD-10-CM | POA: Diagnosis not present

## 2017-05-25 MED ORDER — GABAPENTIN 600 MG PO TABS: ORAL_TABLET | ORAL | 3 refills | Status: DC

## 2017-05-25 NOTE — Progress Notes (Signed)
Patient ID: Melanie Reeves, female   DOB: 1951/07/15, 66 y.o.   MRN: [id number]           Reason for Appointment: Follow-up  for Type 2 Diabetes  Referring physician: Yaakov Guthrie  History of Present Illness:          Date of diagnosis of type 2 diabetes mellitus:  2011       Background history:     She was tested for diabetes when she presented with numbness and tingling in her feet to the [facility]. She does not know what her blood sugars were initially  She was however started on both metformin and glipizide at that time. She thinks that in early 2015 she was started on Lantus insulin also developed poor control  Her record indicates A1c levels ranging from 8.2-9.3 and higher at 11.4 in 9/16 She had been started on Victoza in 12/16  Recent history:   INSULIN regimen is: Toujeo 80 U in am, no Novolog   Non-insulin hypoglycemic drugs the patient is taking are: Invokana,  Metformin ER 2000 mg daily, Victoza 1.2 mg   Previously A1c was going up to 10.6 and is now still around 8%, recently 8.2  Current blood sugar patterns and problems identified:  She says that over the last month or so she has gone off her diet again  Also even though she was told to start taking NovoLog with her evening meal at least she may have taken this a couple of times only and then stop taking it  Her Toujeo was changed to the morning to help her daytime hyperglycemia and she is doing this regularly  However her glucose was 175 in the lab with only drinking coffee in the morning  She has gained weight with going off her diet  She has not checked her blood sugars in quite some time   Side effects from medications have been: none  Compliance with the medical regimen: Improving Hypoglycemia:  never  Glucose monitoring:        Glucometer: Contour      Blood Glucose readings: none   Self-care: The diet that the patient has been following is: None, not restricting carbohydrates and fats;    eating more starchy foods and bread at times    Meal times: Breakfast:10-11 AM Lunch:4 PM Dinner: 7 pm   Typical meal intake: Breakfast is eggs/ low fat meat..  Lunch.  Evening meal is meat, vegetables, rare sweets               Dietician visit, most recent: Years ago               Exercise:  swims 2/7, some walking   Weight history: Lowest 178 lb several years ago, more recently 300-330  Wt Readings from Last 3 Encounters:  05/25/17 296 lb 6.4 oz (134.4 kg)  02/18/17 286 lb 12.8 oz (130.1 kg)  06/24/16 (!) 304 lb (137.9 kg)   Glycemic control:   Lab Results  Component Value Date   HGBA1C 8.2 (H) 05/21/2017   HGBA1C 8.1 (H) 01/30/2017   HGBA1C 10.6 (H) 05/28/2016   Lab Results  Component Value Date   MICROALBUR 2.3 (H) 05/21/2017   LDLCALC 104 (H) 09/17/2015   CREATININE 0.99 05/21/2017      Lab on 05/21/2017  Component Date Value Ref Range Status  . Hgb A1c MFr Bld 05/21/2017 8.2* 4.6 - 6.5 % Final   Glycemic Control Guidelines for People with Diabetes:Non  Diabetic:  <6%Goal of Therapy: <7%Additional Action Suggested:  >8%   . Sodium 05/21/2017 137  135 - 145 mEq/L Final  . Potassium 05/21/2017 4.1  3.5 - 5.1 mEq/L Final  . Chloride 05/21/2017 100  96 - 112 mEq/L Final  . CO2 05/21/2017 26  19 - 32 mEq/L Final  . Glucose, Bld 05/21/2017 175* 70 - 99 mg/dL Final  . BUN 05/21/2017 28* 6 - 23 mg/dL Final  . Creatinine, Ser 05/21/2017 0.99  0.40 - 1.20 mg/dL Final  . Total Bilirubin 05/21/2017 0.3  0.2 - 1.2 mg/dL Final  . Alkaline Phosphatase 05/21/2017 66  39 - 117 U/L Final  . AST 05/21/2017 25  0 - 37 U/L Final  . ALT 05/21/2017 27  0 - 35 U/L Final  . Total Protein 05/21/2017 8.1  6.0 - 8.3 g/dL Final  . Albumin 05/21/2017 4.0  3.5 - 5.2 g/dL Final  . Calcium 05/21/2017 9.5  8.4 - 10.5 mg/dL Final  . GFR 05/21/2017 60.02  >60.00 mL/min Final  . Microalb, Ur 05/21/2017 2.3* 0.0 - 1.9 mg/dL Final  . Creatinine,U 05/21/2017 83.4  mg/dL Final  . Microalb Creat  Ratio 05/21/2017 2.8  0.0 - 30.0 mg/g Final      Allergies as of 05/25/2017      Reactions   Sulfa Antibiotics Other (See Comments)   Cant remember      Medication List       Accurate as of 05/25/17  1:18 PM. Always use your most recent med list.          aspirin 81 MG tablet Take 81 mg by mouth daily.   atenolol 50 MG tablet Commonly known as:  TENORMIN Take 50 mg by mouth every morning.   BAYER CONTOUR NEXT LINK w/Device Kit Use to test blood sugar daily. Dx: E11.65   BAYER MICROLET LANCETS lancets Use to test blood sugar 4 times daily as instructed. Dx: E11.65   calcitRIOL 0.25 MCG capsule Commonly known as:  ROCALTROL Take 0.25 mcg by mouth daily.   cyclobenzaprine 10 MG tablet Commonly known as:  FLEXERIL Take 10 mg by mouth daily.   DULoxetine 30 MG capsule Commonly known as:  CYMBALTA Take 30 mg by mouth daily.   Fish Oil 1000 MG Caps Take 2 capsules by mouth daily.   furosemide 20 MG tablet Commonly known as:  LASIX Take 20 mg by mouth.   gabapentin 600 MG tablet Commonly known as:  NEURONTIN TID and HS   HYDROcodone-acetaminophen 5-325 MG tablet Commonly known as:  NORCO/VICODIN   Insulin Glargine 300 UNIT/ML Sopn Commonly known as:  TOUJEO SOLOSTAR Inject 75 Units into the skin daily.   INVOKANA 100 MG Tabs tablet Generic drug:  canagliflozin TAKE 1 TABLET BY MOUTH EVERY MORNING BEFORE BREAKFAST   latanoprost 0.005 % ophthalmic solution Commonly known as:  XALATAN Place 1 drop into both eyes at bedtime.   loratadine 10 MG tablet Commonly known as:  CLARITIN Take 10 mg by mouth daily.   metFORMIN 500 MG 24 hr tablet Commonly known as:  GLUCOPHAGE-XR Take 4 tablets (2,000 mg total) by mouth daily with supper.   NOVOLOG FLEXPEN 100 UNIT/ML FlexPen Generic drug:  insulin aspart INJECT 10 UNITS 3 TIMES A DAY WITH MEALS   VICTOZA 18 MG/3ML Sopn Generic drug:  liraglutide Inject 0.3 mLs (1.8 mg total) into the skin daily. Inject  once daily at the same time       Allergies:  Allergies  Allergen  Reactions  . Sulfa Antibiotics Other (See Comments)    Cant remember    Past Medical History:  Diagnosis Date  . Diabetes mellitus ([place])   . Hyperlipidemia   . Hypertension   . Morbid obesity ([place])   . OSA (obstructive sleep apnea) 08/16/2014  . Seasonal allergies     Past Surgical History:  Procedure Laterality Date  . CHOLECYSTECTOMY    . COLONOSCOPY WITH PROPOFOL N/A 11/29/2014   Procedure: COLONOSCOPY WITH PROPOFOL;  Surgeon: Arta Silence, MD;  Location: [facility];  Service: Endoscopy;  Laterality: N/A;  . TONSILLECTOMY      Family History  Problem Relation Age of Onset  . Heart disease Father        MI  . Cancer - Lung Father        Small cell Lung CA    Social History:  reports that she quit smoking about 23 years ago. Her smoking use included Cigarettes. She has a 40.00 pack-year smoking history. She has quit using smokeless tobacco. She reports that she drinks alcohol. She reports that she does not use drugs.    Review of Systems    Lipid history:  She is not on any treatment, followed by PCP LDL 99, checked by PCP, last level was 124  Reportedly has leg weakness from simvastatin.      Lab Results  Component Value Date   CHOL 199 02/21/2016   HDL 38.30 (L) 02/21/2016   LDLCALC 104 (H) 09/17/2015   LDLDIRECT 124.0 02/21/2016   TRIG 285.0 (H) 02/21/2016   CHOLHDL 5 02/21/2016         .  Most recent eye exam was In 5/17   Hypertension:  On treatment from PCP; taking atenolol Blood pressure is relatively high today  NEUROPATHY: Has persistent numbness for in her feet the last several years.  Also has sharp twinges of pain  Has parasthesia   especially at night  Currently taking Cymbalta and Neurontin She takes 1200 mg twice a day of gabapentin and wants to continue taking this However she is complaining about more pain during the day  Last foot exam in 5/18 showed:   Monofilament sensation is decreased distally on all the toes and distal plantar surfaces    Physical Examination:  BP 138/88   Pulse 91   Ht '5\' 2"'$  (1.575 m)   Wt 296 lb 6.4 oz (134.4 kg)   SpO2 97%   BMI 54.21 kg/m       ASSESSMENT:  Diabetes type 2,  with morbid obesity and long-standing poor control See history of present illness for detailed discussion of  current management, blood sugar patterns and problems identified  She has not been motivated to watch her diet, take mealtime insulin or check her blood sugars Last A1c was 8.2 and not improving  NEUROPATHY: She is having some improvement in symptoms with increasing gabapentin up to 2400 mg a day, new prescription given  PLAN:    She will continue her Toujeo 80 units in the morning,   She will check her sugars again as directed, some fasting and some after meals especially her main meal in the evening  She will take at least 10 units of NovoLog with her main meal  She will try to increase her Victoza by 5 clicks and if tolerated go up to 1.8 mg  Improve diet with reduced carbohydrate, fat and portions  Follow-up in 2 months  Patient Instructions  Take 10 Novolog with main meal  Victoza 5 clicks more then 1.8 next week  Check blood sugars on waking up  3/7  Also check blood sugars about 2 hours after a meal and do this after different meals by rotation  Recommended blood sugar levels on waking up is 90-130 and about 2 hours after meal is 130-160  Please bring your blood sugar monitor to each visit, thank you      [facility] 05/25/2017, 1:18 PM   Note: This office note was prepared with Dragon voice recognition system technology. Any transcriptional errors that result from this process are unintentional.

## 2017-05-25 NOTE — Patient Instructions (Signed)
Take 10 Novolog with main meal  Victoza 5 clicks more then 1.8 next week  Check blood sugars on waking up  3/7  Also check blood sugars about 2 hours after a meal and do this after different meals by rotation  Recommended blood sugar levels on waking up is 90-130 and about 2 hours after meal is 130-160  Please bring your blood sugar monitor to each visit, thank you

## 2017-06-17 ENCOUNTER — Other Ambulatory Visit: Payer: Self-pay | Admitting: Endocrinology

## 2017-06-18 ENCOUNTER — Other Ambulatory Visit: Payer: Self-pay | Admitting: Endocrinology

## 2017-07-09 ENCOUNTER — Other Ambulatory Visit: Payer: Self-pay | Admitting: Endocrinology

## 2017-07-23 ENCOUNTER — Other Ambulatory Visit (INDEPENDENT_AMBULATORY_CARE_PROVIDER_SITE_OTHER): Payer: BLUE CROSS/BLUE SHIELD

## 2017-07-23 DIAGNOSIS — E1165 Type 2 diabetes mellitus with hyperglycemia: Secondary | ICD-10-CM

## 2017-07-23 DIAGNOSIS — Z794 Long term (current) use of insulin: Secondary | ICD-10-CM | POA: Diagnosis not present

## 2017-07-23 LAB — LIPID PANEL
Cholesterol: 186 mg/dL (ref 0–200)
HDL: 43.5 mg/dL (ref >39.00)
NONHDL: 142.79
Total CHOL/HDL Ratio: 4
Triglycerides: 310 mg/dL — ABNORMAL HIGH (ref 0.0–149.0)
VLDL: 62 mg/dL — ABNORMAL HIGH (ref 0.0–40.0)

## 2017-07-23 LAB — BASIC METABOLIC PANEL
BUN: 18 mg/dL (ref 6–23)
CO2: 21 meq/L (ref 19–32)
Calcium: 8.9 mg/dL (ref 8.4–10.5)
Chloride: 104 mEq/L (ref 96–112)
Creatinine, Ser: 0.83 mg/dL (ref 0.40–1.20)
GFR: 73.52 mL/min (ref >60.00)
GLUCOSE: 165 mg/dL — AB (ref 70–99)
POTASSIUM: 4.3 meq/L (ref 3.5–5.1)
SODIUM: 136 meq/L (ref 135–145)

## 2017-07-23 LAB — LDL CHOLESTEROL, DIRECT: Direct LDL: 101 mg/dL

## 2017-07-24 ENCOUNTER — Other Ambulatory Visit: Payer: BLUE CROSS/BLUE SHIELD

## 2017-07-24 LAB — FRUCTOSAMINE: FRUCTOSAMINE: 246 umol/L (ref 0–285)

## 2017-07-29 ENCOUNTER — Ambulatory Visit (INDEPENDENT_AMBULATORY_CARE_PROVIDER_SITE_OTHER): Payer: BLUE CROSS/BLUE SHIELD | Admitting: Endocrinology

## 2017-07-29 ENCOUNTER — Encounter: Payer: Self-pay | Admitting: Endocrinology

## 2017-07-29 VITALS — BP 142/88 | HR 83 | Ht 62.0 in | Wt 307.8 lb

## 2017-07-29 DIAGNOSIS — E782 Mixed hyperlipidemia: Secondary | ICD-10-CM

## 2017-07-29 DIAGNOSIS — E1165 Type 2 diabetes mellitus with hyperglycemia: Secondary | ICD-10-CM | POA: Diagnosis not present

## 2017-07-29 DIAGNOSIS — Z794 Long term (current) use of insulin: Secondary | ICD-10-CM

## 2017-07-29 MED ORDER — CANAGLIFLOZIN 300 MG PO TABS: 300.0000 mg | ORAL_TABLET | Freq: Every day | ORAL | 3 refills | Status: DC

## 2017-07-29 NOTE — Progress Notes (Signed)
Patient ID: Melanie Reeves, female   DOB: 01/10/1951, 66 y.o.   MRN: [id number]           Reason for Appointment: Follow-up  for Type 2 Diabetes  Referring physician: Yaakov Guthrie  History of Present Illness:          Date of diagnosis of type 2 diabetes mellitus:  2011       Background history:     She was tested for diabetes when she presented with numbness and tingling in her feet to the health Department. She does not know what her blood sugars were initially  She was however started on both metformin and glipizide at that time. She thinks that in early 2015 she was started on Lantus insulin also developed poor control  Her record indicates A1c levels ranging from 8.2-9.3 and higher at 11.4 in 9/16 She had been started on Victoza in 12/16  Recent history:   INSULIN regimen is: Toujeo 80 U in am, no Novolog   Non-insulin hypoglycemic drugs the patient is taking are: Invokana,  Metformin ER 2000 mg daily, Victoza 1.2 mg+5 clicks   Previously W0J was going up to 10.6 and  recently 8.2 Fructosamine is excellent at 246  Current blood sugar patterns and problems identified:  Also even though she was told to start taking NovoLog with her evening meal again she has not done this  She has tried to increase her Victoza although she thinks she had some nausea with the 1.8 mg  She has not checked her blood sugars despite reminders again  Also has gained weight progressively  She was previously on Weight Watchers diet but is not following any diet and has not seen a dietitian in a while  She only watches her diet occasionally; her husband was present in the exam room today   Side effects from medications have been: none  Compliance with the medical regimen: inconsistent Hypoglycemia:  never  Glucose monitoring:        Glucometer: Contour      Blood Glucose readings: none   Self-care: The diet that the patient has been following is: None, not restricting carbohydrates and  fats;   eating more starchy foods and bread at times    Meal times: Breakfast:10-11 AM Lunch:4 PM Dinner: 7 pm   Typical meal intake: Breakfast is eggs/ low fat meat..  Lunch.  Evening meal is meat, vegetables, rare sweets               Dietician visit, most recent: Years ago               Exercise: not walking or going to the pool currently  Weight history: Lowest 178 lb several years ago, more recently 300-330  Wt Readings from Last 3 Encounters:  07/29/17 (!) 307 lb 12.8 oz (139.6 kg)  05/25/17 296 lb 6.4 oz (134.4 kg)  02/18/17 286 lb 12.8 oz (130.1 kg)   Glycemic control:   Lab Results  Component Value Date   HGBA1C 8.2 (H) 05/21/2017   HGBA1C 8.1 (H) 01/30/2017   HGBA1C 10.6 (H) 05/28/2016   Lab Results  Component Value Date   MICROALBUR 2.3 (H) 05/21/2017   LDLCALC 104 (H) 09/17/2015   CREATININE 0.83 07/23/2017      Lab on 07/23/2017  Component Date Value Ref Range Status  . Cholesterol 07/23/2017 186  0 - 200 mg/dL Final   ATP III Classification       Desirable:  < 200  mg/dL               Borderline High:  200 - 239 mg/dL          High:  > = 240 mg/dL  . Triglycerides 07/23/2017 310.0* 0.0 - 149.0 mg/dL Final   Normal:  <150 mg/dLBorderline High:  150 - 199 mg/dL  . HDL 07/23/2017 43.50  >39.00 mg/dL Final  . VLDL 07/23/2017 62.0* 0.0 - 40.0 mg/dL Final  . Total CHOL/HDL Ratio 07/23/2017 4   Final                  Men          Women1/2 Average Risk     3.4          3.3Average Risk          5.0          4.42X Average Risk          9.6          7.13X Average Risk          15.0          11.0                      . NonHDL 07/23/2017 142.79   Final   NOTE:  Non-HDL goal should be 30 mg/dL higher than patient's LDL goal (i.e. LDL goal of < 70 mg/dL, would have non-HDL goal of < 100 mg/dL)  . Sodium 07/23/2017 136  135 - 145 mEq/L Final  . Potassium 07/23/2017 4.3  3.5 - 5.1 mEq/L Final  . Chloride 07/23/2017 104  96 - 112 mEq/L Final  . CO2 07/23/2017 21  19 - 32  mEq/L Final  . Glucose, Bld 07/23/2017 165* 70 - 99 mg/dL Final  . BUN 07/23/2017 18  6 - 23 mg/dL Final  . Creatinine, Ser 07/23/2017 0.83  0.40 - 1.20 mg/dL Final  . Calcium 07/23/2017 8.9  8.4 - 10.5 mg/dL Final  . GFR 07/23/2017 73.52  >60.00 mL/min Final  . Fructosamine 07/23/2017 246  0 - 285 umol/L Final   Comment: Published reference interval for apparently healthy subjects between age 47 and 35 is 65 - 285 umol/L and in a poorly controlled diabetic population is 228 - 563 umol/L with a mean of 396 umol/L.   Marland Kitchen Direct LDL 07/23/2017 101.0  mg/dL Final   Optimal:  <100 mg/dLNear or Above Optimal:  100-129 mg/dLBorderline High:  130-159 mg/dLHigh:  160-189 mg/dLVery High:  >190 mg/dL      Allergies as of 07/29/2017      Reactions   Sulfa Antibiotics Other (See Comments)   Cant remember      Medication List       Accurate as of 07/29/17  1:27 PM. Always use your most recent med list.          aspirin 81 MG tablet Take 81 mg by mouth daily.   atenolol 50 MG tablet Commonly known as:  TENORMIN Take 50 mg by mouth every morning.   BAYER CONTOUR NEXT LINK w/Device Kit Use to test blood sugar daily. Dx: E11.65   BAYER MICROLET LANCETS lancets Use to test blood sugar 4 times daily as instructed. Dx: E11.65   calcitRIOL 0.25 MCG capsule Commonly known as:  ROCALTROL Take 0.25 mcg by mouth daily.   canagliflozin 300 MG Tabs tablet Commonly known as:  INVOKANA Take 1 tablet (300 mg total) by mouth daily before  breakfast.   cyclobenzaprine 10 MG tablet Commonly known as:  FLEXERIL Take 10 mg by mouth daily.   DULoxetine 30 MG capsule Commonly known as:  CYMBALTA Take 30 mg by mouth daily.   Fish Oil 1000 MG Caps Take 2 capsules by mouth daily.   furosemide 20 MG tablet Commonly known as:  LASIX Take 20 mg by mouth.   gabapentin 600 MG tablet Commonly known as:  NEURONTIN TAKE 1 TABLET (600 MG TOTAL) BY MOUTH 3 (THREE) TIMES DAILY.     HYDROcodone-acetaminophen 5-325 MG tablet Commonly known as:  NORCO/VICODIN   latanoprost 0.005 % ophthalmic solution Commonly known as:  XALATAN Place 1 drop into both eyes at bedtime.   loratadine 10 MG tablet Commonly known as:  CLARITIN Take 10 mg by mouth daily.   metFORMIN 500 MG 24 hr tablet Commonly known as:  GLUCOPHAGE-XR Take 4 tablets (2,000 mg total) by mouth daily with supper.   NOVOLOG FLEXPEN 100 UNIT/ML FlexPen Generic drug:  insulin aspart INJECT 10 UNITS 3 TIMES A DAY WITH MEALS   TOUJEO SOLOSTAR 300 UNIT/ML Sopn Generic drug:  Insulin Glargine INJECT 80 UNITS AS DIRECTED DAILY.   VICTOZA 18 MG/3ML Sopn Generic drug:  liraglutide Inject 0.3 mLs (1.8 mg total) into the skin daily. Inject once daily at the same time       Allergies:  Allergies  Allergen Reactions  . Sulfa Antibiotics Other (See Comments)    Cant remember    Past Medical History:  Diagnosis Date  . Diabetes mellitus ([place])   . Hyperlipidemia   . Hypertension   . Morbid obesity ([place])   . OSA (obstructive sleep apnea) 08/16/2014  . Seasonal allergies     Past Surgical History:  Procedure Laterality Date  . CHOLECYSTECTOMY    . COLONOSCOPY WITH PROPOFOL N/A 11/29/2014   Procedure: COLONOSCOPY WITH PROPOFOL;  Surgeon: Arta Silence, MD;  Location: [facility];  Service: Endoscopy;  Laterality: N/A;  . TONSILLECTOMY      Family History  Problem Relation Age of Onset  . Heart disease Father        MI  . Cancer - Lung Father        Small cell Lung CA    Social History:  reports that she quit smoking about 23 years ago. Her smoking use included Cigarettes. She has a 40.00 pack-year smoking history. She has quit using smokeless tobacco. She reports that she drinks alcohol. She reports that she does not use drugs.    Review of Systems    Lipid history:  She is not on any treatment, followed by PCP Triglycerides increased now Reportedly has leg weakness from simvastatin.           .  Most recent eye exam was In 5/17   Hypertension:  On treatment from PCP; taking atenolol Blood pressure is again high today  NEUROPATHY: Has persistent numbness for in her feet the last several years.  Also has sharp twinges of pain  Has parasthesia  especially at night  Currently taking Cymbalta and Neurontin She takes 1200 mg twice a day of gabapentin and wants to continue taking this However she is complaining about more pain during the day  Last foot exam in 5/18 showed:  Monofilament sensation is decreased distally on all the toes and distal plantar surfaces    Physical Examination:  BP (!) 142/88   Pulse 83   Ht _0  (1.575 m)   Wt (!) 307 lb 12.8 oz (  139.6 kg)   SpO2 97%   BMI 56.30 kg/m       ASSESSMENT:  Diabetes type 2,  with morbid obesity and long-standing poor control See history of present illness for detailed discussion of  current management, blood sugar patterns and problems identified  She has not been motivated again to watch her diet, take mealtime insulin or check her blood sugars Discussed today that we cannot adjust her medications are helping her get better controlled without knowing what her blood sugars are She also agrees that she needs some help with meal planning andimprovement of her weight which continues to go up  Last A1c was 8.2 and not improving   PLAN:    She will continue Toujeo 80 units in the morning, May need to increase this  She will check her sugars release once a day at various times and discussed timing of glucose monitoring and targets  She will take at least 10 units of NovoLog with her main meal and if eating carbohydrates at lunch  She will try to increase her Victoza by 5 clicks and if tolerated go up to 1.8 mg  Consultation dietitian  She will try to look into the water aerobics program at the [facility], she can get a handicap sticker for her car from her PCP  Improve diet with reduced snacks, fat and  portions  If she can tolerate 1.8 mg Victoza now she can try to do this but also consider switching to Ozempic  Bring monitor on each visit  Patient Instructions  10 units novolog for each meal with Carbs  Try 1.66m Victoza  Check blood sugars on waking up  3/7  Also check blood sugars about 2 hours after a meal and do this after different meals by rotation  Recommended blood sugar levels on waking up is 90-130 and about 2 hours after meal is 130-180  Please bring your blood sugar monitor to each visit, thank you  Invokana 2 daily   Counseling time on subjects discussed in assessment and plan sections is over 50% of today's 25 minute visit    Melanie Reeves 07/29/2017, 1:27 PM   Note: This office note was prepared with DEstate agent Any transcriptional errors that result from this process are unintentional.

## 2017-07-29 NOTE — Patient Instructions (Addendum)
10 units novolog for each meal with Carbs  Try 1.8mg  Victoza  Check blood sugars on waking up  3/7  Also check blood sugars about 2 hours after a meal and do this after different meals by rotation  Recommended blood sugar levels on waking up is 90-130 and about 2 hours after meal is 130-180  Please bring your blood sugar monitor to each visit, thank you  Invokana 2 daily

## 2017-08-21 ENCOUNTER — Telehealth: Payer: Self-pay

## 2017-08-21 NOTE — Telephone Encounter (Signed)
NOTES SENT TO SCHEDULING.  °

## 2017-08-24 ENCOUNTER — Encounter: Payer: Self-pay | Admitting: Internal Medicine

## 2017-08-24 ENCOUNTER — Ambulatory Visit (INDEPENDENT_AMBULATORY_CARE_PROVIDER_SITE_OTHER): Payer: BLUE CROSS/BLUE SHIELD | Admitting: Internal Medicine

## 2017-08-24 VITALS — BP 144/72 | HR 88 | Ht 62.0 in | Wt 303.8 lb

## 2017-08-24 DIAGNOSIS — R0609 Other forms of dyspnea: Secondary | ICD-10-CM

## 2017-08-24 DIAGNOSIS — R002 Palpitations: Secondary | ICD-10-CM

## 2017-08-24 DIAGNOSIS — I447 Left bundle-branch block, unspecified: Secondary | ICD-10-CM | POA: Diagnosis not present

## 2017-08-24 NOTE — Progress Notes (Signed)
Cardiology Office Note   Date:  08/24/2017   ID:  Melanie Reeves, DOB 1951/03/22, MRN [id number]  PCP:  Vernie Shanks, MD  Cardiologist:   Dorris Carnes, MD       History of Present Illness: Melanie Reeves is a 66 y.o. female with a history of dypsnea.and CP  She has been seen in cardiology in the past  Last by B Crenshaw in 2015  Had LBBB at time   The pt is referred by Dr Jacelyn Grip for palpitatons   Pt says that she will feel her heart pound when she gets up and does activity In bed she will also feel hearrt race  Picks up pulse going 85 to 140 bpm   Patient says seh feels weird.  Not dizzy  Some days feels off balance       Current Meds  Medication Sig  . aspirin 81 MG tablet Take 81 mg by mouth daily.  Marland Kitchen atenolol (TENORMIN) 50 MG tablet Take 50 mg by mouth every morning.   Marland Kitchen BAYER MICROLET LANCETS lancets Use to test blood sugar 4 times daily as instructed. Dx: E11.65  . Blood Glucose Monitoring Suppl (BAYER CONTOUR NEXT LINK) w/Device KIT Use to test blood sugar daily. Dx: E11.65  . calcitRIOL (ROCALTROL) 0.25 MCG capsule Take 0.25 mcg by mouth daily.  . canagliflozin (INVOKANA) 300 MG TABS tablet Take 1 tablet (300 mg total) by mouth daily before breakfast.  . cyclobenzaprine (FLEXERIL) 10 MG tablet Take 10 mg by mouth daily.   . DULoxetine (CYMBALTA) 30 MG capsule Take 30 mg by mouth daily.  Marland Kitchen gabapentin (NEURONTIN) 600 MG tablet TAKE 1 TABLET (600 MG TOTAL) BY MOUTH 3 (THREE) TIMES DAILY.  Marland Kitchen HYDROcodone-acetaminophen (NORCO/VICODIN) 5-325 MG tablet   . latanoprost (XALATAN) 0.005 % ophthalmic solution Place 1 drop into both eyes at bedtime.   Marland Kitchen loratadine (CLARITIN) 10 MG tablet Take 10 mg by mouth daily.  . metFORMIN (GLUCOPHAGE-XR) 500 MG 24 hr tablet Take 4 tablets (2,000 mg total) by mouth daily with supper.  Marland Kitchen NOVOLOG FLEXPEN 100 UNIT/ML FlexPen INJECT 10 UNITS 3 TIMES A DAY WITH MEALS  . Omega-3 Fatty Acids (FISH OIL) 1000 MG CAPS Take 2 capsules by mouth daily.  .  TOUJEO SOLOSTAR 300 UNIT/ML SOPN INJECT 80 UNITS AS DIRECTED DAILY.  Marland Kitchen VICTOZA 18 MG/3ML SOPN Inject 0.3 mLs (1.8 mg total) into the skin daily. Inject once daily at the same time (Patient taking differently: Inject 1.2 mg into the skin daily. Inject once daily at the same time)     Allergies:   Sulfa antibiotics   Past Medical History:  Diagnosis Date  . Abnormal electrocardiogram 08/14/2014  . Diabetes mellitus ([place])   . [place] DEGENERATION 12/20/2009   Qualifier: Diagnosis of  By: Aline Brochure MD, Dorothyann Peng    . Dyspnea 08/14/2014  . Hyperlipidemia   . Hypertension   . LOW BACK PAIN 12/20/2009   Qualifier: Diagnosis of  By: Aline Brochure MD, Dorothyann Peng    . Morbid obesity ([place])   . OSA (obstructive sleep apnea) 08/16/2014  . Seasonal allergies   . SPINAL STENOSIS 12/20/2009   Qualifier: Diagnosis of  By: Aline Brochure MD, Dorothyann Peng      Past Surgical History:  Procedure Laterality Date  . CHOLECYSTECTOMY    . TONSILLECTOMY       Social History:  The patient  reports that she quit smoking about 23 years ago. Her smoking use included cigarettes. She has a 40.00 pack-year smoking  history. She has quit using smokeless tobacco. She reports that she drinks alcohol. She reports that she does not use drugs.   Family History:  The patient's family history includes Cancer - Lung in her father; Heart disease in her father.    ROS:  Please see the history of present illness. All other systems are reviewed and  Negative to the above problem except as noted.    PHYSICAL EXAM: VS:  BP (!) 144/72   Pulse 88   Ht '5\' 2"'  (1.575 m)   Wt (!) 303 lb 12.8 oz (137.8 kg)   SpO2 94%   BMI 55.57 kg/m   GEN:  Morbidly obese 66 yo , in no acute distress  HEENT: normal  Neck: no JVD, carotid bruits, or masses Cardiac: RRR; no murmurs, rubs, or gallops,no edema  Respiratory:  clear to auscultation bilaterally, normal work of breathing GI: soft, nontender, nondistended, + BS  No hepatomegaly  MS: no deformity Moving all  extremities   Skin: warm and dry, no rash Neuro:  Strength and sensation are intact    EKG:  EKG is not ordered today. In 09/2016 SR  LBBB with PACss   Lipid Panel    Component Value Date/Time   CHOL 186 07/23/2017 1119   TRIG 310.0 (H) 07/23/2017 1119   HDL 43.50 07/23/2017 1119   CHOLHDL 4 07/23/2017 1119   VLDL 62.0 (H) 07/23/2017 1119   LDLCALC 104 (H) 09/17/2015 1102   LDLDIRECT 101.0 07/23/2017 1119      Wt Readings from Last 3 Encounters:  08/24/17 (!) 303 lb 12.8 oz (137.8 kg)  07/29/17 (!) 307 lb 12.8 oz (139.6 kg)  05/25/17 296 lb 6.4 oz (134.4 kg)      ASSESSMENT AND PLAN: 1   Palpitations  Pt with spells  They do not sound hemodynamically significant but do not know rhythm  Will set up for event monitor    Check CBC and TSH   2   Dyspnea  Stable  Volume looks OK   Follow  Needs to lose wt  3   Morbid obesity  Needs to lose wt  Current medicines are reviewed at length with the patient today.  The patient does not have concerns regarding medicines.  Signed, Dorris Carnes, MD  08/24/2017 3:45 PM    [facility] [place], [place], [place]  [address] Phone: [phone]; Fax: [phone]

## 2017-08-24 NOTE — Patient Instructions (Addendum)
Your physician recommends that you continue on your current medications as directed. Please refer to the Current Medication list given to you today.  Your physician recommends that you return for lab work today (TSH, CBC)  Your physician has recommended that you wear an event monitor. Event monitors are medical devices that record the heart's electrical activity. Doctors most often us these monitors to diagnose arrhythmias. Arrhythmias are problems with the speed or rhythm of the heartbeat. The monitor is a small, portable device. You can wear one while you do your normal daily activities. This is usually used to diagnose what is causing palpitations/syncope (passing out).  Your physician recommends that you schedule a follow-up appointment AS NEEDED WITH DR. Tenny CrawOSS.

## 2017-08-25 LAB — CBC
Hematocrit: 42.3 % (ref 34.0–46.6)
Hemoglobin: 13.6 g/dL (ref 11.1–15.9)
MCH: 28.1 pg (ref 26.6–33.0)
MCHC: 32.2 g/dL (ref 31.5–35.7)
MCV: 87 fL (ref 79–97)
PLATELETS: 288 10*3/uL (ref 150–379)
RBC: 4.84 x10E6/uL (ref 3.77–5.28)
RDW: 14.9 % (ref 12.3–15.4)
WBC: 15.1 10*3/uL — AB (ref 3.4–10.8)

## 2017-08-25 LAB — TSH: TSH: 1.42 u[IU]/mL (ref 0.450–4.500)

## 2017-08-26 ENCOUNTER — Other Ambulatory Visit: Payer: Self-pay

## 2017-08-26 MED ORDER — INSULIN GLARGINE 300 UNIT/ML ~~LOC~~ SOPN: 80.0000 [IU] | PEN_INJECTOR | Freq: Every day | SUBCUTANEOUS | 1 refills | Status: DC

## 2017-09-08 ENCOUNTER — Ambulatory Visit (INDEPENDENT_AMBULATORY_CARE_PROVIDER_SITE_OTHER): Payer: BLUE CROSS/BLUE SHIELD

## 2017-09-08 ENCOUNTER — Other Ambulatory Visit: Payer: BLUE CROSS/BLUE SHIELD

## 2017-09-08 DIAGNOSIS — R002 Palpitations: Secondary | ICD-10-CM | POA: Diagnosis not present

## 2017-09-10 ENCOUNTER — Other Ambulatory Visit (INDEPENDENT_AMBULATORY_CARE_PROVIDER_SITE_OTHER): Payer: BLUE CROSS/BLUE SHIELD

## 2017-09-10 DIAGNOSIS — E1165 Type 2 diabetes mellitus with hyperglycemia: Secondary | ICD-10-CM

## 2017-09-10 DIAGNOSIS — Z794 Long term (current) use of insulin: Secondary | ICD-10-CM | POA: Diagnosis not present

## 2017-09-10 LAB — GLUCOSE, RANDOM: GLUCOSE: 159 mg/dL — AB (ref 70–99)

## 2017-09-10 LAB — HEMOGLOBIN A1C: HEMOGLOBIN A1C: 8.3 % — AB (ref 4.6–6.5)

## 2017-09-11 ENCOUNTER — Ambulatory Visit: Payer: BLUE CROSS/BLUE SHIELD | Admitting: Endocrinology

## 2017-09-13 ENCOUNTER — Telehealth: Payer: Self-pay | Admitting: Physician Assistant

## 2017-09-13 NOTE — Telephone Encounter (Signed)
Pt jus seen in cardiology clnic  Atrial fib is new dx She has HTN, HL   Needs to be on an anticoagulant Recom:  Stop aspirin Start Xarelto 20 mg per day INcrease atenolol to 75 mg daily Set f/u for clinic in next couple wks

## 2017-09-13 NOTE — Telephone Encounter (Signed)
Service called stating a first documented occurrence of Afib at 0855 this morning with ventricular rate in the 120s. This was sustained until a second transmission at 0915 that showed NSR. Pt reported palpitations while resting in bed.   In consultation with Dr. Royann Shiversroitoru, Afib documented in this note and message will be sent to scheduling for an appt ASAP.

## 2017-09-14 ENCOUNTER — Encounter: Payer: Self-pay | Admitting: Endocrinology

## 2017-09-14 ENCOUNTER — Ambulatory Visit (INDEPENDENT_AMBULATORY_CARE_PROVIDER_SITE_OTHER): Payer: BLUE CROSS/BLUE SHIELD | Admitting: Endocrinology

## 2017-09-14 VITALS — BP 132/76 | HR 86 | Ht 62.0 in | Wt 304.4 lb

## 2017-09-14 DIAGNOSIS — Z794 Long term (current) use of insulin: Secondary | ICD-10-CM | POA: Diagnosis not present

## 2017-09-14 DIAGNOSIS — E1165 Type 2 diabetes mellitus with hyperglycemia: Secondary | ICD-10-CM

## 2017-09-14 MED ORDER — ATENOLOL 50 MG PO TABS: 75.0000 mg | ORAL_TABLET | Freq: Every day | ORAL | 11 refills | Status: DC

## 2017-09-14 MED ORDER — RIVAROXABAN 20 MG PO TABS: 20.0000 mg | ORAL_TABLET | Freq: Every day | ORAL | 11 refills | Status: DC

## 2017-09-14 NOTE — Progress Notes (Signed)
Patient ID: Melanie Reeves, female   DOB: 1951/10/11, 66 y.o.   MRN: [id number]           Reason for Appointment: Follow-up  for Type 2 Diabetes  Referring physician: Yaakov Guthrie  History of Present Illness:          Date of diagnosis of type 2 diabetes mellitus:  2011       Background history:     She was tested for diabetes when she presented with numbness and tingling in her feet to the health [facility]. She does not know what her blood sugars were initially  She was however started on both metformin and glipizide at that time. She thinks that in early 2015 she was started on Lantus insulin also developed poor control  Her record indicates A1c levels ranging from 8.2-9.3 and higher at 11.4 in 9/16 She had been started on Victoza in 12/16  Recent history:   INSULIN regimen is: Toujeo 80 U in am, no Novolog   Non-insulin hypoglycemic drugs the patient is taking are: Invokana 300 mg,  Metformin ER 2000 mg daily, Victoza 1.8   Current blood sugar patterns and problems identified:  A1c is still about the same at 8.3, has been below 8% only once in 2017  She has been able to increase her Victoza to 1.8% and she thinks she is doing a little better and her diet with a 3 pound weight loss  Again however she is not taking her NOVOLOG as directed because she forgets  Also is checking only FASTING blood sugars are still averaging high at 169 and occasionally over 200  This is despite taking Invokana also and relatively large doses of basal insulin  Because of various problems including knee pain she has not done any exercise previously doing water aerobics  She does have a couple of good readings in the mornings, this may be related to her diet the night before   Side effects from medications have been: none  Compliance with the medical regimen: inconsistent Hypoglycemia:  never  Glucose monitoring:        Glucometer: Contour      Blood Glucose readings:   Recent FASTING  range 133-235 AVERAGE = 169   Self-care: The diet that the patient has been following is: None, not restricting carbohydrates and fats;   eating more starchy foods and bread at times    Meal times: Breakfast:10-11 AM Lunch:4 PM Dinner: 7 pm    Typical meal intake: Breakfast is eggs/ low fat meat..  Lunch.  Evening meal is meat, vegetables, rare sweets               Dietician visit, most recent: Years ago               Exercise:  none recently, not walking or going to the pool   Weight history: Lowest 178 lb several years ago, more recently 300-330  Wt Readings from Last 3 Encounters:  09/14/17 (!) 304 lb 6.4 oz (138.1 kg)  08/24/17 (!) 303 lb 12.8 oz (137.8 kg)  07/29/17 (!) 307 lb 12.8 oz (139.6 kg)   Glycemic control:   Lab Results  Component Value Date   HGBA1C 8.3 (H) 09/10/2017   HGBA1C 8.2 (H) 05/21/2017   HGBA1C 8.1 (H) 01/30/2017   Lab Results  Component Value Date   MICROALBUR 2.3 (H) 05/21/2017   LDLCALC 104 (H) 09/17/2015   CREATININE 0.83 07/23/2017      Lab on 09/10/2017  Component Date Value Ref Range Status  . Glucose, Bld 09/10/2017 159* 70 - 99 mg/dL Final  . Hgb A1c MFr Bld 09/10/2017 8.3* 4.6 - 6.5 % Final   Glycemic Control Guidelines for People with Diabetes:Non Diabetic:  <6%Goal of Therapy: <7%Additional Action Suggested:  >8%       Allergies as of 09/14/2017      Reactions   Sulfa Antibiotics Other (See Comments)   Cant remember      Medication List        Accurate as of 09/14/17  3:34 PM. Always use your most recent med list.          atenolol 50 MG tablet Commonly known as:  TENORMIN Take 1.5 tablets (75 mg total) by mouth daily.   BAYER CONTOUR NEXT LINK w/Device Kit Use to test blood sugar daily. Dx: E11.65   BAYER MICROLET LANCETS lancets Use to test blood sugar 4 times daily as instructed. Dx: E11.65   calcitRIOL 0.25 MCG capsule Commonly known as:  ROCALTROL Take 0.25 mcg by mouth daily.   canagliflozin 300 MG Tabs  tablet Commonly known as:  INVOKANA Take 1 tablet (300 mg total) by mouth daily before breakfast.   cyclobenzaprine 10 MG tablet Commonly known as:  FLEXERIL Take 10 mg by mouth daily.   DULoxetine 30 MG capsule Commonly known as:  CYMBALTA Take 30 mg by mouth daily.   Fish Oil 1000 MG Caps Take 2 capsules by mouth daily.   gabapentin 600 MG tablet Commonly known as:  NEURONTIN TAKE 1 TABLET (600 MG TOTAL) BY MOUTH 3 (THREE) TIMES DAILY.   HYDROcodone-acetaminophen 5-325 MG tablet Commonly known as:  NORCO/VICODIN   Insulin Glargine 300 UNIT/ML Sopn Commonly known as:  TOUJEO SOLOSTAR Inject 80 Units daily as directed.   latanoprost 0.005 % ophthalmic solution Commonly known as:  XALATAN Place 1 drop into both eyes at bedtime.   loratadine 10 MG tablet Commonly known as:  CLARITIN Take 10 mg by mouth daily.   metFORMIN 500 MG 24 hr tablet Commonly known as:  GLUCOPHAGE-XR Take 4 tablets (2,000 mg total) by mouth daily with supper.   NOVOLOG FLEXPEN 100 UNIT/ML FlexPen Generic drug:  insulin aspart INJECT 10 UNITS 3 TIMES A DAY WITH MEALS   rivaroxaban 20 MG Tabs tablet Commonly known as:  XARELTO Take 1 tablet (20 mg total) by mouth daily with supper.   VICTOZA 18 MG/3ML Sopn Generic drug:  liraglutide Inject 0.3 mLs (1.8 mg total) into the skin daily. Inject once daily at the same time       Allergies:  Allergies  Allergen Reactions  . Sulfa Antibiotics Other (See Comments)    Cant remember    Past Medical History:  Diagnosis Date  . Abnormal electrocardiogram 08/14/2014  . Diabetes mellitus ([place])   . [place] DEGENERATION 12/20/2009   Qualifier: Diagnosis of  By: Aline Brochure MD, Dorothyann Peng    . Dyspnea 08/14/2014  . Hyperlipidemia   . Hypertension   . LOW BACK PAIN 12/20/2009   Qualifier: Diagnosis of  By: Aline Brochure MD, Dorothyann Peng    . Morbid obesity ([place])   . OSA (obstructive sleep apnea) 08/16/2014  . Seasonal allergies   . SPINAL STENOSIS 12/20/2009    Qualifier: Diagnosis of  By: Aline Brochure MD, Dorothyann Peng      Past Surgical History:  Procedure Laterality Date  . CHOLECYSTECTOMY    . COLONOSCOPY WITH PROPOFOL N/A 11/29/2014   Procedure: COLONOSCOPY WITH PROPOFOL;  Surgeon: Arta Silence, MD;  Location:  [facility];  Service: Endoscopy;  Laterality: N/A;  . TONSILLECTOMY      Family History  Problem Relation Age of Onset  . Heart disease Father        MI  . Cancer - Lung Father        Small cell Lung CA    Social History:  reports that she quit smoking about 23 years ago. Her smoking use included cigarettes. She has a 40.00 pack-year smoking history. She has quit using smokeless tobacco. She reports that she drinks alcohol. She reports that she does not use drugs.    Review of Systems    Lipid history:  She is not on any treatment, followed by PCP Triglycerides increased now Reportedly has leg weakness from simvastatin.          .  Most recent eye exam was In 9/18   Hypertension:  On treatment from PCP; taking atenolol only, may also be benefiting from [place] Blood pressure is good  NEUROPATHY: Has persistent numbness for in her feet the last several years.  Also has sharp twinges of pain  Has parasthesiae and also occasionally burning including at night, other symptoms are  Currently taking Cymbalta and Neurontin She takes 1200 mg twice a day of gabapentin   Last foot exam in 5/18 showed:  Monofilament sensation is decreased distally on all the toes and distal plantar surfaces    Physical Examination:  BP 132/76   Pulse 86   Ht '5\' 2"'  (1.575 m)   Wt (!) 304 lb 6.4 oz (138.1 kg)   SpO2 96%   BMI 55.68 kg/m       ASSESSMENT:  Diabetes type 2,  with morbid obesity and long-standing poor control  See history of present illness for detailed discussion of  current management, blood sugar patterns and problems identified  Her A1c is consistently over 8% now She is checking only occasional blood sugars and  difficult to know what her blood sugar patterns are Most likely has high postprandial readings even with taking 1.8 mg Victoza now Overall not motivated to change her lifestyle and self-care measures including blood sugar testing, taking Novolog when she needs to and any formal exercise   NEUROPATHY: Somewhat better controlled and medically  PLAN:    She will continue Toujeo 80 units since her fasting readings are not consistently high  She will start taking NOVOLOG at least once a day with her main meal and may take it within 15 minutes of the meal before or after  Discussed that if she is able to check her blood sugars after meals this would be very helpful and be able to adjust her NovoLog doses  She should discuss her diet with dietitian also  Discussed postprandial readings targets and will need to keep them consistently under 180  Most likely she does better with her suppertime diet and NovoLog for fasting readings may be more consistent  Also may consider insulin pump like Omnipod but she is likely to be able to follow instructions or do mealtime insulin with this  Meanwhile continue Invokana and Victoza   Patient Instructions  Check blood sugars on waking up 3/7  Also check blood sugars about 2 hours after a meal and do this after different meals by rotation  Recommended blood sugar levels on waking up is 90-130 and about 2 hours after meal is 130-160  Please bring your blood sugar monitor to each visit, thank you  Novolog before each main meal  Start going to Y      Counseling time on subjects discussed in assessment and plan sections is over 50% of today's 25 minute visit    Yug Loria 09/14/2017, 3:34 PM   Note: This office note was prepared with Estate agent. Any transcriptional errors that result from this process are unintentional.

## 2017-09-14 NOTE — Addendum Note (Signed)
Addended by: Lendon KaWILSON, Roniyah Llorens on: 09/14/2017 09:30 AM   Modules accepted: Orders

## 2017-09-14 NOTE — Telephone Encounter (Addendum)
Called and spoke with patient. She verbalizes understanding of medication changes, she will stop asa and start Xarelto 20 mg with supper today.  Also will increase atenolol to 75 mg starting today.  Advised to continue to report symptoms via the heart monitor.  Explained reason for medication adjustment/changes.  She is aware we will call her back with a clinic appointment for in next couple weeks.

## 2017-09-14 NOTE — Patient Instructions (Addendum)
Check blood sugars on waking up 3/7  Also check blood sugars about 2 hours after a meal and do this after different meals by rotation  Recommended blood sugar levels on waking up is 90-130 and about 2 hours after meal is 130-160  Please bring your blood sugar monitor to each visit, thank you  Novolog before each main meal  Start going [employer]

## 2017-09-15 NOTE — Telephone Encounter (Signed)
Called patient and scheduled with Nada BoozerLaura Ingold, NP for 12/17 at 10:30 am.  [facility] [address]  She picked up the Xarelto yesterday, took first dose last evening.

## 2017-09-17 ENCOUNTER — Encounter: Payer: Self-pay | Admitting: Internal Medicine

## 2017-09-17 ENCOUNTER — Ambulatory Visit (INDEPENDENT_AMBULATORY_CARE_PROVIDER_SITE_OTHER): Payer: BLUE CROSS/BLUE SHIELD | Admitting: Internal Medicine

## 2017-09-17 ENCOUNTER — Other Ambulatory Visit: Payer: Self-pay | Admitting: Endocrinology

## 2017-09-17 VITALS — BP 128/72 | HR 80 | Ht 62.0 in | Wt 308.0 lb

## 2017-09-17 DIAGNOSIS — D729 Disorder of white blood cells, unspecified: Secondary | ICD-10-CM

## 2017-09-17 DIAGNOSIS — I4891 Unspecified atrial fibrillation: Secondary | ICD-10-CM | POA: Diagnosis not present

## 2017-09-17 DIAGNOSIS — R0789 Other chest pain: Secondary | ICD-10-CM

## 2017-09-17 LAB — CBC WITH DIFFERENTIAL
BASOS ABS: 0 10*3/uL (ref 0.0–0.2)
Basos: 0 %
EOS (ABSOLUTE): 0.2 10*3/uL (ref 0.0–0.4)
Eos: 2 %
Hematocrit: 39.6 % (ref 34.0–46.6)
Hemoglobin: 12.8 g/dL (ref 11.1–15.9)
IMMATURE GRANULOCYTES: 0 %
Immature Grans (Abs): 0 10*3/uL (ref 0.0–0.1)
LYMPHS ABS: 3.3 10*3/uL — AB (ref 0.7–3.1)
Lymphs: 25 %
MCH: 28.8 pg (ref 26.6–33.0)
MCHC: 32.3 g/dL (ref 31.5–35.7)
MCV: 89 fL (ref 79–97)
MONOS ABS: 0.5 10*3/uL (ref 0.1–0.9)
Monocytes: 4 %
Neutrophils Absolute: 9.3 10*3/uL — ABNORMAL HIGH (ref 1.4–7.0)
Neutrophils: 69 %
RBC: 4.45 x10E6/uL (ref 3.77–5.28)
RDW: 14.6 % (ref 12.3–15.4)
WBC: 13.5 10*3/uL — ABNORMAL HIGH (ref 3.4–10.8)

## 2017-09-17 NOTE — Progress Notes (Signed)
Cardiology Office Note   Date:  09/17/2017   ID:  Melanie Reeves, DOB [birth year], MRN [id number]  PCP:  Vernie Shanks, MD  Cardiologist:   Dorris Carnes, MD    F/U for atrial fbrillation   History of Present Illness: Melanie Reeves is a 66 y.o. female with a history of dypsnea.and CP  She has been seen in cardiology in the past  Last by B Crenshaw in 2015  Had LBBB at time   The pt is referred by Dr Jacelyn Grip for palpitatons   I saw her in November   Complained of intermittent heart racing   UP to 140  Pt set up for event monitor  This showed atrial fibrilliation   The pt still has monitor  Denies dizziness  Does get winded with activity   Occasional chest tightness    Current Meds  Medication Sig  . atenolol (TENORMIN) 50 MG tablet Take 1.5 tablets (75 mg total) by mouth daily.  Marland Kitchen BAYER MICROLET LANCETS lancets Use to test blood sugar 4 times daily as instructed. Dx: E11.65  . Blood Glucose Monitoring Suppl (BAYER CONTOUR NEXT LINK) w/Device KIT Use to test blood sugar daily. Dx: E11.65  . calcitRIOL (ROCALTROL) 0.25 MCG capsule Take 0.25 mcg by mouth daily.  . canagliflozin (INVOKANA) 300 MG TABS tablet Take 1 tablet (300 mg total) by mouth daily before breakfast.  . cyclobenzaprine (FLEXERIL) 10 MG tablet Take 10 mg by mouth daily.   . DULoxetine (CYMBALTA) 30 MG capsule Take 30 mg by mouth daily.  Marland Kitchen gabapentin (NEURONTIN) 600 MG tablet TAKE 1 TABLET (600 MG TOTAL) BY MOUTH 3 (THREE) TIMES DAILY.  Marland Kitchen HYDROcodone-acetaminophen (NORCO/VICODIN) 5-325 MG tablet   . Insulin Glargine (TOUJEO SOLOSTAR) 300 UNIT/ML SOPN Inject 80 Units daily as directed.  . latanoprost (XALATAN) 0.005 % ophthalmic solution Place 1 drop into both eyes at bedtime.   Marland Kitchen loratadine (CLARITIN) 10 MG tablet Take 10 mg by mouth daily.  . metFORMIN (GLUCOPHAGE-XR) 500 MG 24 hr tablet Take 4 tablets (2,000 mg total) by mouth daily with supper.  Marland Kitchen NOVOLOG FLEXPEN 100 UNIT/ML FlexPen INJECT 10 UNITS 3 TIMES A DAY WITH  MEALS  . Omega-3 Fatty Acids (FISH OIL) 1000 MG CAPS Take 2 capsules by mouth daily.  . rivaroxaban (XARELTO) 20 MG TABS tablet Take 1 tablet (20 mg total) by mouth daily with supper.  Marland Kitchen VICTOZA 18 MG/3ML SOPN INJECT 0.3 MLS (1.8 MG TOTAL) INTO THE SKIN DAILY. INJECT ONCE DAILY AT THE SAME TIME     Allergies:   Sulfa antibiotics   Past Medical History:  Diagnosis Date  . Abnormal electrocardiogram 08/14/2014  . Diabetes mellitus ([place])   . [place] DEGENERATION 12/20/2009   Qualifier: Diagnosis of  By: Aline Brochure MD, Dorothyann Peng    . Dyspnea 08/14/2014  . Hyperlipidemia   . Hypertension   . LOW BACK PAIN 12/20/2009   Qualifier: Diagnosis of  By: Aline Brochure MD, Dorothyann Peng    . Morbid obesity ([place])   . OSA (obstructive sleep apnea) 08/16/2014  . Seasonal allergies   . SPINAL STENOSIS 12/20/2009   Qualifier: Diagnosis of  By: Aline Brochure MD, Dorothyann Peng      Past Surgical History:  Procedure Laterality Date  . CHOLECYSTECTOMY    . COLONOSCOPY WITH PROPOFOL N/A 11/29/2014   Procedure: COLONOSCOPY WITH PROPOFOL;  Surgeon: Arta Silence, MD;  Location: [facility];  Service: Endoscopy;  Laterality: N/A;  . TONSILLECTOMY       Social History:  The  patient  reports that she quit smoking about 23 years ago. Her smoking use included cigarettes. She has a 40.00 pack-year smoking history. She has quit using smokeless tobacco. She reports that she drinks alcohol. She reports that she does not use drugs.   Family History:  The patient's family history includes Cancer - Lung in her father; Heart disease in her father.    ROS:  Please see the history of present illness. All other systems are reviewed and  Negative to the above problem except as noted.    PHYSICAL EXAM: VS:  BP 128/72   Pulse 80   Ht '5\' 2"'  (1.575 m)   Wt (!) 308 lb (139.7 kg)   SpO2 95%   BMI 56.33 kg/m   GEN:  Morbidly obese 66 yo , in no acute distress  HEENT: normal  Neck: no JVD, carotid bruits, or masses Cardiac:RRR S1, S2   Normal ;  no murmurs, rubs, or gallops,no edema  Respiratory:  clear to auscultation bilaterally, normal work of breathing GI: soft, nontender, nondistended, + BS  No hepatomegaly  MS: no deformity Moving all extremities   Skin: warm and dry, no rash Neuro:  Strength and sensation are intact    EKG:  EKG is not ordered today.   Lipid Panel    Component Value Date/Time   CHOL 186 07/23/2017 1119   TRIG 310.0 (H) 07/23/2017 1119   HDL 43.50 07/23/2017 1119   CHOLHDL 4 07/23/2017 1119   VLDL 62.0 (H) 07/23/2017 1119   LDLCALC 104 (H) 09/17/2015 1102   LDLDIRECT 101.0 07/23/2017 1119      Wt Readings from Last 3 Encounters:  09/17/17 (!) 308 lb (139.7 kg)  09/14/17 (!) 304 lb 6.4 oz (138.1 kg)  08/24/17 (!) 303 lb 12.8 oz (137.8 kg)      ASSESSMENT AND PLAN: 1   Atrial fib   Found on event monitor   Pt's CHADS2VASc is 3   Continue with montior  Needs to stay on NOAC  Check CBC  Would schedule an echo to evaluate   2   Dyspnea  Stable  Volume appears OK  It has been attrib to obesity but with DM I would recomm a lexiscan myovue to eval for ischemia   3   Morbid obesity  Needs to lose wt  4  OSA  Need to review old sleep study Did not appear to have signif apnea     Current medicines are reviewed at length with the patient today.  The patient does not have concerns regarding medicines.  Signed, Dorris Carnes, MD  09/17/2017 11:43 AM    [facility], [place], [place]  [address] Phone: [phone]; Fax: [phone]

## 2017-09-17 NOTE — Patient Instructions (Signed)
Your physician recommends that you continue on your current medications as directed. Please refer to the Current Medication list given to you today.  Your physician recommends that you return for lab work today (CBC w/ differential)  Your physician has requested that you have an echocardiogram. Echocardiography is a painless test that uses sound waves to create images of your heart. It provides your doctor with information about the size and shape of your heart and how well your heart's chambers and valves are working. This procedure takes approximately one hour. There are no restrictions for this procedure.  Your physician has requested that you have a lexiscan myoview. For further information please visit [email or web address]. Please follow instruction sheet, as given.

## 2017-09-23 ENCOUNTER — Other Ambulatory Visit: Payer: Self-pay

## 2017-09-23 MED ORDER — INSULIN GLARGINE 300 UNIT/ML ~~LOC~~ SOPN: 80.0000 [IU] | PEN_INJECTOR | Freq: Every day | SUBCUTANEOUS | 1 refills | Status: DC

## 2017-09-24 ENCOUNTER — Other Ambulatory Visit: Payer: Self-pay

## 2017-09-24 MED ORDER — INSULIN GLARGINE 300 UNIT/ML ~~LOC~~ SOPN: 80.0000 [IU] | PEN_INJECTOR | Freq: Every day | SUBCUTANEOUS | 3 refills | Status: DC

## 2017-09-28 ENCOUNTER — Ambulatory Visit: Payer: BLUE CROSS/BLUE SHIELD | Admitting: Cardiology

## 2017-09-28 ENCOUNTER — Telehealth (HOSPITAL_COMMUNITY): Payer: Self-pay | Admitting: *Deleted

## 2017-09-28 NOTE — Telephone Encounter (Signed)
Patient given detailed instructions per Myocardial Perfusion Study Information Sheet for the test on 09/30/17. Patient notified to arrive 15 minutes early and that it is imperative to arrive on time for appointment to keep from having the test rescheduled.  If you need to cancel or reschedule your appointment, please call the office within 24 hours of your appointment. . Patient verbalized understanding. Melanie Reeves    

## 2017-09-30 ENCOUNTER — Other Ambulatory Visit: Payer: Self-pay

## 2017-09-30 ENCOUNTER — Ambulatory Visit (HOSPITAL_BASED_OUTPATIENT_CLINIC_OR_DEPARTMENT_OTHER): Payer: BLUE CROSS/BLUE SHIELD

## 2017-09-30 ENCOUNTER — Ambulatory Visit (HOSPITAL_COMMUNITY): Payer: BLUE CROSS/BLUE SHIELD | Attending: Internal Medicine

## 2017-09-30 DIAGNOSIS — R0789 Other chest pain: Secondary | ICD-10-CM | POA: Insufficient documentation

## 2017-09-30 DIAGNOSIS — I5030 Unspecified diastolic (congestive) heart failure: Secondary | ICD-10-CM | POA: Diagnosis not present

## 2017-09-30 DIAGNOSIS — R9439 Abnormal result of other cardiovascular function study: Secondary | ICD-10-CM | POA: Diagnosis not present

## 2017-09-30 DIAGNOSIS — I4891 Unspecified atrial fibrillation: Secondary | ICD-10-CM | POA: Diagnosis not present

## 2017-09-30 LAB — ECHOCARDIOGRAM COMPLETE
Height: 62 in
Weight: 4928 oz

## 2017-09-30 MED ORDER — REGADENOSON 0.4 MG/5ML IV SOLN
0.4000 mg | Freq: Once | INTRAVENOUS | Status: AC
  Administered 2017-09-30: 0.4 mg via INTRAVENOUS

## 2017-09-30 MED ORDER — PERFLUTREN LIPID MICROSPHERE
1.0000 mL | INTRAVENOUS | Status: AC | PRN
  Administered 2017-09-30: 2 mL via INTRAVENOUS

## 2017-09-30 MED ORDER — TECHNETIUM TC 99M TETROFOSMIN IV KIT
33.0000 | PACK | Freq: Once | INTRAVENOUS | Status: AC | PRN
Start: 2017-09-30 — End: 2017-09-30
  Administered 2017-09-30: 33 via INTRAVENOUS
  Filled 2017-09-30: qty 33

## 2017-10-01 ENCOUNTER — Ambulatory Visit (HOSPITAL_COMMUNITY): Payer: BLUE CROSS/BLUE SHIELD | Attending: Cardiovascular Disease

## 2017-10-01 LAB — MYOCARDIAL PERFUSION IMAGING
CHL CUP NUCLEAR SSS: 11
LV sys vol: 65 mL
LVDIAVOL: 117 mL (ref 46–106)
Peak HR: 86 {beats}/min
RATE: 0.25
Rest HR: 83 {beats}/min
SDS: 2
SRS: 9
TID: 0.95

## 2017-10-01 MED ORDER — TECHNETIUM TC 99M TETROFOSMIN IV KIT
32.9000 | PACK | Freq: Once | INTRAVENOUS | Status: AC | PRN
  Administered 2017-10-01: 32.9 via INTRAVENOUS
  Filled 2017-10-01: qty 33

## 2017-10-02 ENCOUNTER — Other Ambulatory Visit: Payer: Self-pay | Admitting: *Deleted

## 2017-10-15 ENCOUNTER — Other Ambulatory Visit: Payer: Self-pay | Admitting: *Deleted

## 2017-10-15 MED ORDER — ATENOLOL 50 MG PO TABS: 50.0000 mg | ORAL_TABLET | Freq: Two times a day (BID) | ORAL | 11 refills | Status: DC

## 2017-11-15 ENCOUNTER — Other Ambulatory Visit: Payer: Self-pay | Admitting: Endocrinology

## 2017-11-17 ENCOUNTER — Other Ambulatory Visit: Payer: Self-pay | Admitting: Endocrinology

## 2017-12-10 ENCOUNTER — Other Ambulatory Visit: Payer: BLUE CROSS/BLUE SHIELD

## 2017-12-13 ENCOUNTER — Other Ambulatory Visit: Payer: Self-pay | Admitting: Endocrinology

## 2017-12-14 ENCOUNTER — Ambulatory Visit: Payer: BLUE CROSS/BLUE SHIELD | Admitting: Endocrinology

## 2018-01-12 ENCOUNTER — Other Ambulatory Visit: Payer: Self-pay

## 2018-01-12 MED ORDER — GABAPENTIN 600 MG PO TABS: ORAL_TABLET | ORAL | 3 refills | Status: DC

## 2018-03-11 ENCOUNTER — Telehealth: Payer: Self-pay | Admitting: Endocrinology

## 2018-03-11 NOTE — Telephone Encounter (Signed)
Patient called and given MD message. Pt verbalized understanding.

## 2018-03-11 NOTE — Telephone Encounter (Signed)
There is no substitute for Invokana, she can continue metformin

## 2018-03-11 NOTE — Telephone Encounter (Signed)
Patient also wants to know if this is in substitution for the Invokana as well. It costs $100 per month

## 2018-03-11 NOTE — Telephone Encounter (Signed)
Please advise 

## 2018-03-11 NOTE — Telephone Encounter (Signed)
Called patient to schedule follow up appointment she stated she does not have insurance and will call back in either July or aug to schedule.  She stated that she can not afford her insulin and would like to know if there is anything she can do to be able to get her insulin.  Please advise

## 2018-03-11 NOTE — Telephone Encounter (Signed)
She can take Novolin N, [employer] brand generic OTC, 40 units at breakfast and bedtime instead of Toujeo.

## 2018-05-05 DIAGNOSIS — L03119 Cellulitis of unspecified part of limb: Secondary | ICD-10-CM | POA: Diagnosis not present

## 2018-05-05 DIAGNOSIS — I872 Venous insufficiency (chronic) (peripheral): Secondary | ICD-10-CM | POA: Diagnosis not present

## 2018-05-09 ENCOUNTER — Other Ambulatory Visit: Payer: Self-pay | Admitting: Endocrinology

## 2018-05-11 DIAGNOSIS — E1165 Type 2 diabetes mellitus with hyperglycemia: Secondary | ICD-10-CM | POA: Diagnosis not present

## 2018-05-11 DIAGNOSIS — I10 Essential (primary) hypertension: Secondary | ICD-10-CM | POA: Diagnosis not present

## 2018-05-11 DIAGNOSIS — Z9119 Patient's noncompliance with other medical treatment and regimen: Secondary | ICD-10-CM | POA: Diagnosis not present

## 2018-05-11 DIAGNOSIS — E1142 Type 2 diabetes mellitus with diabetic polyneuropathy: Secondary | ICD-10-CM | POA: Diagnosis not present

## 2018-05-11 DIAGNOSIS — N183 Chronic kidney disease, stage 3 (moderate): Secondary | ICD-10-CM | POA: Diagnosis not present

## 2018-05-11 DIAGNOSIS — R600 Localized edema: Secondary | ICD-10-CM | POA: Diagnosis not present

## 2018-05-11 DIAGNOSIS — E785 Hyperlipidemia, unspecified: Secondary | ICD-10-CM | POA: Diagnosis not present

## 2018-05-11 DIAGNOSIS — Z8739 Personal history of other diseases of the musculoskeletal system and connective tissue: Secondary | ICD-10-CM | POA: Diagnosis not present

## 2018-05-11 DIAGNOSIS — I872 Venous insufficiency (chronic) (peripheral): Secondary | ICD-10-CM | POA: Diagnosis not present

## 2018-05-11 DIAGNOSIS — Z6841 Body Mass Index (BMI) 40.0 and over, adult: Secondary | ICD-10-CM | POA: Diagnosis not present

## 2018-05-17 DIAGNOSIS — L509 Urticaria, unspecified: Secondary | ICD-10-CM | POA: Diagnosis not present

## 2018-05-17 DIAGNOSIS — E119 Type 2 diabetes mellitus without complications: Secondary | ICD-10-CM | POA: Diagnosis not present

## 2018-05-18 DIAGNOSIS — R21 Rash and other nonspecific skin eruption: Secondary | ICD-10-CM | POA: Diagnosis not present

## 2018-06-07 DIAGNOSIS — F321 Major depressive disorder, single episode, moderate: Secondary | ICD-10-CM | POA: Diagnosis not present

## 2018-06-07 DIAGNOSIS — Z6841 Body Mass Index (BMI) 40.0 and over, adult: Secondary | ICD-10-CM | POA: Diagnosis not present

## 2018-06-07 DIAGNOSIS — R600 Localized edema: Secondary | ICD-10-CM | POA: Diagnosis not present

## 2018-06-07 DIAGNOSIS — E785 Hyperlipidemia, unspecified: Secondary | ICD-10-CM | POA: Diagnosis not present

## 2018-06-07 DIAGNOSIS — N183 Chronic kidney disease, stage 3 (moderate): Secondary | ICD-10-CM | POA: Diagnosis not present

## 2018-06-07 DIAGNOSIS — E1165 Type 2 diabetes mellitus with hyperglycemia: Secondary | ICD-10-CM | POA: Diagnosis not present

## 2018-06-07 DIAGNOSIS — G4733 Obstructive sleep apnea (adult) (pediatric): Secondary | ICD-10-CM | POA: Diagnosis not present

## 2018-06-07 DIAGNOSIS — I10 Essential (primary) hypertension: Secondary | ICD-10-CM | POA: Diagnosis not present

## 2018-06-07 DIAGNOSIS — I872 Venous insufficiency (chronic) (peripheral): Secondary | ICD-10-CM | POA: Diagnosis not present

## 2018-06-07 DIAGNOSIS — E1142 Type 2 diabetes mellitus with diabetic polyneuropathy: Secondary | ICD-10-CM | POA: Diagnosis not present

## 2018-06-09 DIAGNOSIS — I10 Essential (primary) hypertension: Secondary | ICD-10-CM | POA: Diagnosis not present

## 2018-06-09 DIAGNOSIS — F321 Major depressive disorder, single episode, moderate: Secondary | ICD-10-CM | POA: Diagnosis not present

## 2018-06-09 DIAGNOSIS — I872 Venous insufficiency (chronic) (peripheral): Secondary | ICD-10-CM | POA: Diagnosis not present

## 2018-06-09 DIAGNOSIS — E785 Hyperlipidemia, unspecified: Secondary | ICD-10-CM | POA: Diagnosis not present

## 2018-06-09 DIAGNOSIS — Z6841 Body Mass Index (BMI) 40.0 and over, adult: Secondary | ICD-10-CM | POA: Diagnosis not present

## 2018-06-09 DIAGNOSIS — E1165 Type 2 diabetes mellitus with hyperglycemia: Secondary | ICD-10-CM | POA: Diagnosis not present

## 2018-06-09 DIAGNOSIS — R600 Localized edema: Secondary | ICD-10-CM | POA: Diagnosis not present

## 2018-06-09 DIAGNOSIS — N183 Chronic kidney disease, stage 3 (moderate): Secondary | ICD-10-CM | POA: Diagnosis not present

## 2018-06-09 DIAGNOSIS — E1142 Type 2 diabetes mellitus with diabetic polyneuropathy: Secondary | ICD-10-CM | POA: Diagnosis not present

## 2018-06-09 DIAGNOSIS — Z9119 Patient's noncompliance with other medical treatment and regimen: Secondary | ICD-10-CM | POA: Diagnosis not present

## 2018-06-24 ENCOUNTER — Other Ambulatory Visit: Payer: BLUE CROSS/BLUE SHIELD

## 2018-06-25 ENCOUNTER — Other Ambulatory Visit (INDEPENDENT_AMBULATORY_CARE_PROVIDER_SITE_OTHER): Payer: PPO

## 2018-06-25 DIAGNOSIS — Z794 Long term (current) use of insulin: Secondary | ICD-10-CM

## 2018-06-25 DIAGNOSIS — E1165 Type 2 diabetes mellitus with hyperglycemia: Secondary | ICD-10-CM | POA: Diagnosis not present

## 2018-06-25 LAB — HEMOGLOBIN A1C: Hgb A1c MFr Bld: 9.9 % — ABNORMAL HIGH (ref 4.6–6.5)

## 2018-06-25 LAB — BASIC METABOLIC PANEL
BUN: 28 mg/dL — AB (ref 6–23)
CHLORIDE: 100 meq/L (ref 96–112)
CO2: 19 mEq/L (ref 19–32)
CREATININE: 1.04 mg/dL (ref 0.40–1.20)
Calcium: 9.9 mg/dL (ref 8.4–10.5)
GFR: 56.5 mL/min — AB (ref >60.00)
GLUCOSE: 280 mg/dL — AB (ref 70–99)
Potassium: 4.1 mEq/L (ref 3.5–5.1)
Sodium: 135 mEq/L (ref 135–145)

## 2018-06-28 ENCOUNTER — Ambulatory Visit: Payer: PPO | Admitting: Endocrinology

## 2018-06-28 ENCOUNTER — Encounter: Payer: Self-pay | Admitting: Endocrinology

## 2018-06-28 ENCOUNTER — Other Ambulatory Visit: Payer: Self-pay | Admitting: Endocrinology

## 2018-06-28 VITALS — BP 128/82 | HR 76 | Ht 62.0 in | Wt 305.0 lb

## 2018-06-28 DIAGNOSIS — Z794 Long term (current) use of insulin: Secondary | ICD-10-CM | POA: Diagnosis not present

## 2018-06-28 DIAGNOSIS — E1142 Type 2 diabetes mellitus with diabetic polyneuropathy: Secondary | ICD-10-CM

## 2018-06-28 DIAGNOSIS — E1165 Type 2 diabetes mellitus with hyperglycemia: Secondary | ICD-10-CM | POA: Diagnosis not present

## 2018-06-28 MED ORDER — INSULIN PEN NEEDLE 31G X 6 MM MISC: 1.0000 | Freq: Every day | 3 refills | Status: DC

## 2018-06-28 MED ORDER — CANAGLIFLOZIN 300 MG PO TABS: ORAL_TABLET | ORAL | 3 refills | Status: DC

## 2018-06-28 MED ORDER — METFORMIN HCL ER 500 MG PO TB24: 2000.0000 mg | ORAL_TABLET | Freq: Every day | ORAL | 1 refills | Status: DC

## 2018-06-28 MED ORDER — LIRAGLUTIDE 18 MG/3ML ~~LOC~~ SOPN: 1.8000 mg | PEN_INJECTOR | Freq: Every day | SUBCUTANEOUS | 3 refills | Status: DC

## 2018-06-28 NOTE — Patient Instructions (Addendum)
Increase Toujeo to 90 and go up 5 units if am sugar stays > 140  Novolog 10 for small meals and 15 for full meals  Check blood sugars on waking up  daily  Also check blood sugars about 2 hours after a meal and do this after different meals by rotation  Recommended blood sugar levels on waking up is 90-130 and about 2 hours after meal is 130-160  Please bring your blood sugar monitor to each visit, thank you  Start VICTOZA injection as shown once daily at the same time of the day.   Dial the dose to 0.6 mg on the pen for the first 5 days then 1.2

## 2018-06-28 NOTE — Progress Notes (Signed)
Patient ID: Melanie Reeves, female   DOB: 1951-09-23, 66 y.o.   MRN: [id number]           Reason for Appointment: Follow-up  for Type 2 Diabetes  Referring physician: Yaakov Guthrie  History of Present Illness:          Date of diagnosis of type 2 diabetes mellitus:  2011       Background history:     She was tested for diabetes when she presented with numbness and tingling in her feet to the health Department. She does not know what her blood sugars were initially  She was however started on both metformin and glipizide at that time. She thinks that in early 2015 she was started on Lantus insulin also developed poor control  Her record indicates A1c levels ranging from 8.2-9.3 and higher at 11.4 in 9/16 She had been started on Victoza in 12/16  Recent history:   INSULIN regimen is: Toujeo 80 U in am, no Novolog   Non-insulin hypoglycemic drugs the patient is taking are: Invokana 300 mg,  Metformin ER 2000 mg daily, off Victoza 1.2 2 months   Current blood sugar patterns and problems identified:  A1c is significantly higher at 9.9  She has not been seen in follow-up since 12/18 and she is now able to come back because of being on Medicare finally  Although she thinks that she is taking her TOUJEO regularly her fasting glucose was 280 in the lab  She thinks otherwise her morning readings are about 170 but has not checked in a month because of lack of test strips  Also have been out of bed to stop for 2 months  Unclear if she is taking her Invokana regularly or not but she thinks she is  She thinks that some of her diet compliance has been poor with portions are getting sweets but not sweet drinks  Usually not checking readings after meals  Weight is about the same   Side effects from medications have been: none  Compliance with the medical regimen: inconsistent Hypoglycemia:  never  Glucose monitoring:        Glucometer: Contour      Blood Glucose readings: Last  fasting glucose at home 170   Self-care: The diet that the patient has been following is: None, not restricting carbohydrates and fats;   eating more starchy foods and bread at times    Meal times: Breakfast:10-11 AM Lunch:4 PM Dinner: 7 pm    Typical meal intake: Breakfast is eggs/ low fat meat..  Lunch.  Evening meal is meat, vegetables,                 Dietician visit, most recent: Years ago               Exercise:  none  Weight history: Lowest 178 lb several years ago, more recently 300-330  Wt Readings from Last 3 Encounters:  06/28/18 (!) 305 lb (138.3 kg)  09/30/17 (!) 308 lb (139.7 kg)  09/17/17 (!) 308 lb (139.7 kg)   Glycemic control:   Lab Results  Component Value Date   HGBA1C 9.9 (H) 06/25/2018   HGBA1C 8.3 (H) 09/10/2017   HGBA1C 8.2 (H) 05/21/2017   Lab Results  Component Value Date   MICROALBUR 2.3 (H) 05/21/2017   LDLCALC 104 (H) 09/17/2015   CREATININE 1.04 06/25/2018      Lab on 06/25/2018  Component Date Value Ref Range Status  . Sodium 06/25/2018 135  135 - 145 mEq/L Final  . Potassium 06/25/2018 4.1  3.5 - 5.1 mEq/L Final  . Chloride 06/25/2018 100  96 - 112 mEq/L Final  . CO2 06/25/2018 19  19 - 32 mEq/L Final  . Glucose, Bld 06/25/2018 280* 70 - 99 mg/dL Final  . BUN 06/25/2018 28* 6 - 23 mg/dL Final  . Creatinine, Ser 06/25/2018 1.04  0.40 - 1.20 mg/dL Final  . Calcium 06/25/2018 9.9  8.4 - 10.5 mg/dL Final  . GFR 06/25/2018 56.50* >60.00 mL/min Final  . Hgb A1c MFr Bld 06/25/2018 9.9* 4.6 - 6.5 % Final   Glycemic Control Guidelines for People with Diabetes:Non Diabetic:  <6%Goal of Therapy: <7%Additional Action Suggested:  >8%       Allergies as of 06/28/2018      Reactions   Sulfa Antibiotics Other (See Comments)   Cant remember      Medication List        Accurate as of 06/28/18 10:25 AM. Always use your most recent med list.          atenolol 50 MG tablet Commonly known as:  TENORMIN Take 1 tablet (50 mg total) by mouth 2  (two) times daily.   BAYER CONTOUR NEXT LINK w/Device Kit Use to test blood sugar daily. Dx: E11.65   BAYER MICROLET LANCETS lancets Use to test blood sugar 4 times daily as instructed. Dx: E11.65   calcitRIOL 0.25 MCG capsule Commonly known as:  ROCALTROL Take 0.25 mcg by mouth daily.   cyclobenzaprine 10 MG tablet Commonly known as:  FLEXERIL Take 10 mg by mouth daily.   DULoxetine 30 MG capsule Commonly known as:  CYMBALTA Take 30 mg by mouth daily.   Fish Oil 1000 MG Caps Take 2 capsules by mouth daily.   gabapentin 600 MG tablet Commonly known as:  NEURONTIN TAKE 1 TABLET BY MOUTH 3 TIMES A DAY AND 1 TABLET AT BEDTIME   HYDROcodone-acetaminophen 5-325 MG tablet Commonly known as:  NORCO/VICODIN   Insulin Glargine 300 UNIT/ML Sopn Inject 80 Units as directed daily.   INVOKANA 300 MG Tabs tablet Generic drug:  canagliflozin TAKE 1 TABLET (300 MG TOTAL) BY MOUTH DAILY BEFORE BREAKFAST.   latanoprost 0.005 % ophthalmic solution Commonly known as:  XALATAN Place 1 drop into both eyes at bedtime.   loratadine 10 MG tablet Commonly known as:  CLARITIN Take 10 mg by mouth daily.   metFORMIN 500 MG 24 hr tablet Commonly known as:  GLUCOPHAGE-XR TAKE 4 TABLETS (2,000 MG TOTAL) BY MOUTH DAILY WITH SUPPER.   NOVOLOG FLEXPEN 100 UNIT/ML FlexPen Generic drug:  insulin aspart INJECT 10 UNITS 3 TIMES A DAY WITH MEALS   rivaroxaban 20 MG Tabs tablet Commonly known as:  XARELTO Take 1 tablet (20 mg total) by mouth daily with supper.   VICTOZA 18 MG/3ML Sopn Generic drug:  liraglutide INJECT 0.3 MLS (1.8 MG TOTAL) INTO THE SKIN DAILY. INJECT ONCE DAILY AT THE SAME TIME       Allergies:  Allergies  Allergen Reactions  . Sulfa Antibiotics Other (See Comments)    Cant remember    Past Medical History:  Diagnosis Date  . Abnormal electrocardiogram 08/14/2014  . Diabetes mellitus ([place])   . [place] DEGENERATION 12/20/2009   Qualifier: Diagnosis of  By: Aline Brochure MD,  Dorothyann Peng    . Dyspnea 08/14/2014  . Hyperlipidemia   . Hypertension   . LOW BACK PAIN 12/20/2009   Qualifier: Diagnosis of  By: Aline Brochure MD, Dorothyann Peng    .  Morbid obesity ([place])   . OSA (obstructive sleep apnea) 08/16/2014  . Seasonal allergies   . SPINAL STENOSIS 12/20/2009   Qualifier: Diagnosis of  By: Aline Brochure MD, Dorothyann Peng      Past Surgical History:  Procedure Laterality Date  . CHOLECYSTECTOMY    . COLONOSCOPY WITH PROPOFOL N/A 11/29/2014   Procedure: COLONOSCOPY WITH PROPOFOL;  Surgeon: Arta Silence, MD;  Location: [facility];  Service: Endoscopy;  Laterality: N/A;  . TONSILLECTOMY      Family History  Problem Relation Age of Onset  . Heart disease Father        MI  . Cancer - Lung Father        Small cell Lung CA    Social History:  reports that she quit smoking about 24 years ago. Her smoking use included cigarettes. She has a 40.00 pack-year smoking history. She has quit using smokeless tobacco. She reports that she drinks alcohol. She reports that she does not use drugs.    Review of Systems    Lipid history:  She is not on any treatment, followed by PCP Triglycerides increased  Reportedly has leg weakness from simvastatin.          .  Most recent eye exam was In 9/18   Hypertension:  On treatment from PCP; taking atenolol as only treatment Her blood pressure was controlled  NEUROPATHY: Has persistent numbness for in her feet the last several years.  Also has twinges of pain Also has weakness in her legs  Currently taking Cymbalta and Neurontin She takes 1200 mg twice a day of gabapentin   Last foot exam in 5/18 showed:  Monofilament sensation is decreased distally on all the toes and distal plantar surfaces    Physical Examination:  BP 128/82   Pulse 76   Ht '5\' 2"'  (1.575 m)   Wt (!) 305 lb (138.3 kg)   SpO2 99%   BMI 55.79 kg/m       ASSESSMENT:  Diabetes type 2,  with morbid obesity and long-standing poor control  See history of present  illness for detailed discussion of  current management, blood sugar patterns and problems identified  Her A1c is consistently over 8%, now 9.9  Poor control is from a combination of not consistently watching her diet, probably not taking her diabetes management consistently and especially Victoza Do not think she is taking her Toujeo regularly as fasting reading is markedly increased at 280 Though she thinks she is compliant with his Also doubt if she is taking Invokana regularly Also without any test strips not clear what her blood sugar patterns are including after meals   NEUROPATHY: Symptomatic and on Neurontin She does have more weakness in her legs now  Renal function: Stable  PLAN:    She will increase Toujeo from 80 units up to 90 units for now and may-go to 95 if fasting readings are still over 140 consistently after 1 week  Consider NovoLog but will need to first have her start checking her sugars at various times as discussed and new prescription for test strips given  She will check sugars about 2 hours after at least her main meal and more often after other meals  Blood sugar targets should be at least under 180 after meals  To bring her monitor for download on each visit  Restart Victoza with 0.6 for the first 5 days and then 1.2  Strongly recommended seeing the dietitian but she refuses because of the cost  She will need to try and improve her diet consistently portion control and cutting back on simple sugars  Consider NovoLog again if postprandial readings are consistently high  She will follow-up with her PCP for other nonspecific symptoms including dizziness   There are no Patient Instructions on file for this visit.  Counseling time on subjects discussed in assessment and plan sections is over 50% of today's 25 minute visit    Elayne Snare 06/28/2018, 10:25 AM   Note: This office note was prepared with Dragon voice recognition system technology. Any  transcriptional errors that result from this process are unintentional.

## 2018-06-30 ENCOUNTER — Telehealth: Payer: Self-pay | Admitting: Endocrinology

## 2018-06-30 NOTE — Telephone Encounter (Signed)
Patient stated that she is needing the contour next strips sent into the pharmacy and would like to know if her insulin as been sent in also Please Advise  NOVOLOG FLEXPEN 100 UNIT/ML FlexPen    [id number] - [place], [place] - [address]

## 2018-07-02 ENCOUNTER — Other Ambulatory Visit: Payer: Self-pay | Admitting: Endocrinology

## 2018-07-02 MED ORDER — GLUCOSE BLOOD VI STRP: ORAL_STRIP | 12 refills | Status: DC

## 2018-07-02 MED ORDER — GLUCOSE BLOOD VI STRP: ORAL_STRIP | 3 refills | Status: DC

## 2018-07-02 MED ORDER — ACCU-CHEK FASTCLIX LANCETS MISC: 0 refills | Status: DC

## 2018-07-02 MED ORDER — ACCU-CHEK GUIDE W/DEVICE KIT: 1.0000 | PACK | Freq: Once | 0 refills | Status: DC

## 2018-07-02 NOTE — Telephone Encounter (Signed)
Pt is aware and advised that Contour is no longer covered sent in new meter and supplies for her

## 2018-07-02 NOTE — Telephone Encounter (Signed)
Please advise on Novolog, per note that would not be sent until compliance with checking sugar? I sent test strips today because I did not see where they had been sent.

## 2018-07-02 NOTE — Telephone Encounter (Signed)
NovoLog will be sent based on her blood sugar readings on her follow-up visit

## 2018-07-02 NOTE — Addendum Note (Signed)
Addended by: Yolande JollyLAWSON, Dorthie Santini on: 07/02/2018 04:32 PM   Modules accepted: Orders

## 2018-07-02 NOTE — Addendum Note (Signed)
Addended by: Yolande JollyLAWSON, Yalanda Soderman on: 07/02/2018 03:47 PM   Modules accepted: Orders

## 2018-07-04 ENCOUNTER — Other Ambulatory Visit: Payer: Self-pay | Admitting: Endocrinology

## 2018-07-09 ENCOUNTER — Other Ambulatory Visit: Payer: Self-pay

## 2018-07-09 MED ORDER — GLUCOSE BLOOD VI STRP: ORAL_STRIP | 12 refills | Status: DC

## 2018-07-09 MED ORDER — ONETOUCH VERIO W/DEVICE KIT: PACK | 0 refills | Status: AC

## 2018-07-09 NOTE — Progress Notes (Signed)
Ins covers onetouch verio/sent in per [place]/thx dmf

## 2018-07-16 ENCOUNTER — Other Ambulatory Visit: Payer: Self-pay | Admitting: Endocrinology

## 2018-08-02 ENCOUNTER — Other Ambulatory Visit: Payer: Self-pay | Admitting: Internal Medicine

## 2018-08-09 ENCOUNTER — Institutional Professional Consult (permissible substitution): Payer: PPO | Admitting: Internal Medicine

## 2018-08-16 ENCOUNTER — Ambulatory Visit: Payer: PPO | Admitting: Internal Medicine

## 2018-08-16 ENCOUNTER — Encounter: Payer: Self-pay | Admitting: Internal Medicine

## 2018-08-16 VITALS — BP 128/76 | HR 76 | Ht 62.0 in | Wt 302.6 lb

## 2018-08-16 DIAGNOSIS — E1165 Type 2 diabetes mellitus with hyperglycemia: Secondary | ICD-10-CM | POA: Diagnosis not present

## 2018-08-16 DIAGNOSIS — Z794 Long term (current) use of insulin: Secondary | ICD-10-CM | POA: Diagnosis not present

## 2018-08-16 LAB — GLUCOSE, POCT (MANUAL RESULT ENTRY): POC GLUCOSE: 271 mg/dL — AB (ref 70–99)

## 2018-08-16 NOTE — Progress Notes (Signed)
Name: Melanie Reeves  Age/ Sex: 67 y.o., female   MRN/ DOB: [id number], 1950/11/18     PCP: Vernie Shanks, MD   Reason for Endocrinology Evaluation: Type 2 Diabetes Mellitus  Initial Endocrine Consultative Visit: 07/27/15    PATIENT IDENTIFIER: Melanie Reeves is a 67 y.o. female with a past medical history of HTN, OSA and obesity . The patient has followed with Endocrinology clinic since 2016 for consultative assistance with management of her diabetes.  DIABETIC HISTORY:  Ms. Harriss was diagnosed with DM in 2011, she was initially started on Metformin and Glipizide. Insulin therapy approximately 4 years after diagnosis. Her hemoglobin A1c has ranged from 7.4% in 2017, peaking at 11.4% in 2016. She has been on Victoza since 2016    SUBJECTIVE:   During the last visit (06/28/18): A1c was 9.9%. Toujeo was increased from 80 units to 90 units. Victoza was restarted at 0.6 mg daily . ? Metformin, ? Invokana   Today (08/16/2018): [name] is here for a 6 weeks follow up on her diabetes management.  She is accompanied by her husband today. She has not been checking glucose at all . She did not increase her dose of Toujeo. She does forget to take Toujeo and Vicotoza on average once a week.  She does tend to snack between meals. She is unable to exercise due to join aches and pains.  Today she is in a wheelchair, as she can't walk long distance.  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   She used to be on novolog but has not taken it in 6 months. She used to forget to take it .    ROS: As per HPI and as detailed below: Review of Systems  Constitutional: Positive for malaise/fatigue and weight loss.  Eyes: Positive for blurred vision.  Respiratory: Negative for cough and shortness of breath.   Cardiovascular: Negative for chest pain and palpitations.  Musculoskeletal: Positive for  back pain and joint pain.  Psychiatric/Behavioral: Positive for depression. The patient is nervous/anxious.       HOME DIABETES REGIMEN:  Toujeo 90 units daily - patient takes 80 units  Victoza 1.2 mg daily  Metformin 500 mg 4 tab daily  Invokana 300 mg daily   Statin: No ACE-I/ARB: No    METER DOWNLOAD SUMMARY: does not check glucose.      HISTORY:  Past Medical History:  Past Medical History:  Diagnosis Date  . Abnormal electrocardiogram 08/14/2014  . Diabetes mellitus ([place])   . [place] DEGENERATION 12/20/2009   Qualifier: Diagnosis of  By: Aline Brochure MD, Dorothyann Peng    . Dyspnea 08/14/2014  . Hyperlipidemia   . Hypertension   . LOW BACK PAIN 12/20/2009   Qualifier: Diagnosis of  By: Aline Brochure MD, Dorothyann Peng    . Morbid obesity ([place])   . OSA (obstructive sleep apnea) 08/16/2014  . Seasonal allergies   . SPINAL STENOSIS 12/20/2009   Qualifier: Diagnosis of  By: Aline Brochure MD, Dorothyann Peng     Past Surgical History:  Past Surgical History:  Procedure Laterality Date  . CHOLECYSTECTOMY    . COLONOSCOPY WITH PROPOFOL N/A 11/29/2014   Procedure: COLONOSCOPY WITH PROPOFOL;  Surgeon: Arta Silence, MD;  Location: [facility];  Service: Endoscopy;  Laterality: N/A;  . TONSILLECTOMY      Social History:  reports that she quit smoking about 24 years ago. Her smoking use included cigarettes. She has a 40.00 pack-year smoking history. She  has quit using smokeless tobacco. She reports that she drinks alcohol. She reports that she does not use drugs. Family History:  Family History  Problem Relation Age of Onset  . Heart disease Father        MI  . Cancer - Lung Father        Small cell Lung CA     HOME MEDICATIONS: Allergies as of 08/16/2018      Reactions   Sulfa Antibiotics Other (See Comments)   Cant remember      Medication List        Accurate as of 08/16/18  3:21 PM. Always use your most recent med list.          ACCU-CHEK FASTCLIX LANCETS Misc Use as instructed to test  three times daily. DX: E11.65   atenolol 50 MG tablet Commonly known as:  TENORMIN Take 1 tablet (50 mg total) by mouth 2 (two) times daily.   B-D ULTRAFINE III SHORT PEN 31G X 8 MM Misc Generic drug:  Insulin Pen Needle 1 PACKAGE BY DOES NOT APPLY ROUTE DAILY.   calcitRIOL 0.25 MCG capsule Commonly known as:  ROCALTROL Take 0.25 mcg by mouth daily.   canagliflozin 300 MG Tabs tablet Commonly known as:  INVOKANA TAKE 1 TABLET (300 MG TOTAL) BY MOUTH DAILY BEFORE BREAKFAST.   CONTOUR NEXT TEST test strip Generic drug:  glucose blood USE TO TEST BLOOD SUGAR 4 TIMES DAILY AS INSTRUCTED   cyclobenzaprine 10 MG tablet Commonly known as:  FLEXERIL Take 10 mg by mouth daily.   DULoxetine 30 MG capsule Commonly known as:  CYMBALTA Take 30 mg by mouth daily.   Fish Oil 1000 MG Caps Take 2 capsules by mouth daily.   gabapentin 600 MG tablet Commonly known as:  NEURONTIN TAKE 1 TABLET BY MOUTH 3 TIMES A DAY AND 1 TABLET AT BEDTIME   HYDROcodone-acetaminophen 5-325 MG tablet Commonly known as:  NORCO/VICODIN   Insulin Glargine 300 UNIT/ML Sopn Inject 80 Units as directed daily.   latanoprost 0.005 % ophthalmic solution Commonly known as:  XALATAN Place 1 drop into both eyes at bedtime.   liraglutide 18 MG/3ML Sopn Commonly known as:  VICTOZA Inject 0.3 mLs (1.8 mg total) into the skin daily. Inject once daily at the same time   loratadine 10 MG tablet Commonly known as:  CLARITIN Take 10 mg by mouth daily.   metFORMIN 500 MG 24 hr tablet Commonly known as:  GLUCOPHAGE-XR Take 4 tablets (2,000 mg total) by mouth daily with supper.   NOVOLOG FLEXPEN 100 UNIT/ML FlexPen Generic drug:  insulin aspart INJECT 10 UNITS 3 TIMES A DAY WITH MEALS   ONETOUCH VERIO w/Device Kit UAD to monitor glucose tid; Dx: E11.65   XARELTO 20 MG Tabs tablet Generic drug:  rivaroxaban TAKE 1 TABLET (20 MG TOTAL) BY MOUTH DAILY WITH SUPPER.        OBJECTIVE:   Vital Signs: BP  128/76 (BP Location: Right Arm, Patient Position: Sitting, Cuff Size: Large)   Pulse 76   Wt (!) 302 lb 9.6 oz (137.3 kg)   SpO2 96%   BMI 55.35 kg/m   Wt Readings from Last 3 Encounters:  08/16/18 (!) 302 lb 9.6 oz (137.3 kg)  06/28/18 (!) 305 lb (138.3 kg)  09/30/17 (!) 308 lb (139.7 kg)     Exam: General: Pt in a wheelchair,  in NAD  HEENT: Head: Unremarkable with good dentition. Oropharynx clear without exudate.  Eyes: External eye exam normal  without stare, lid lag or exophthalmos.  EOM intact.    Neck: General: Supple without adenopathy. Thyroid: Thyroid size normal.  No goiter or nodules appreciated. No thyroid bruit.  Lungs: Clear with good BS bilat with no rales, rhonchi, or wheezes  Heart: RRR with normal S1 and S2 and no gallops; no murmurs; no rub  Abdomen: Normoactive bowel sounds, soft, nontender, without masses or organomegaly palpable  Extremities: 1+  pretibial edema. Evidence of stasis dermatitis.   Neuro: MS is good with appropriate affect, pt is alert and Ox3             DATA REVIEWED:  In-office glucose 271 mg/dL     Lab Results  Component Value Date   HGBA1C 9.9 (H) 06/25/2018   HGBA1C 8.3 (H) 09/10/2017   HGBA1C 8.2 (H) 05/21/2017     ASSESSMENT / PLAN / RECOMMENDATIONS:   1) Type 2 Diabetes Mellitus, poorly controlled, With microvascular complications - Most recent A1c of 9.9 %. Goal A1c < 7.0 %.   Plan:  - Patient is not motivated to care for diabetes, she has poor medication adherence as well as dietary indiscretions.  - Main barrier for care is her depression. PCP referred her to a psychiatrist and I urged her to contact then ASAP to start treatment.  - Would prefer an antidepressant with no weight gain side effect.  - Encouraged patient to check glucose twice a day (fasting and bedtime) and to bring log on next visit.  - I explained to her that I will not be able to make any adjustments to her regimen without glucose  Data.  -  Discussed pathophysiology of diabetes and its complications such as blindness and non-traumatic amputations.  - We discussed best exercise for her is swimming if she is able to find an indoor pool in her community given her limited mobility- She has silver sneakers   MEDICATIONS:  Continue Toujeo 80 units daily  Continue Metformin 2000 mg daily  Continue Invokana 300 mg daily   Continue Victoza 1.2 mg daily   EDUCATION / INSTRUCTIONS:  BG monitoring instructions: Patient is instructed to check her blood sugars 2 times a day, fasting and bedtime.  Call [place]  Endocrinology clinic if: BG persistently < 70 or > 300. . I reviewed the Rule of 15 for the treatment of hypoglycemia in detail with the patient. Literature supplied.  2. Depression :  - This is a major barrier to her care at this time. I doubt that we will be able to make much difference in glucose care without addressing her depression first.  - Patient again urged to schedule an appointment with psychiatrist per PCP recommendations.   Time spent with patient was 30 minutes, > 50 was spent in education, answering questions and counseling about diabetes.  F/U in 2 months    Signed electronically by: Mack Guise, MD  [facility] Endocr[facility]  [facility] [address] [place], [place] [address] Phone: [phone] FAX: [phone]   CC: Vernie Shanks, [place] [address] Phone: [phone]  Fax: [phone]  Return to Endocrinology clinic as below: No future appointments.

## 2018-08-16 NOTE — Patient Instructions (Addendum)
-   Please contact the psychiatrist ASAP - Check glucose twice a day (fasting and bedtime) - Please contact us with sugars below 70 or higher then 250 mg/dL  - Continue Toujeo 80 units - Continue Metformin 500 mg 4 tablets daily - Continue Invokana 300 mg daily  - Continue Victoza 1.2 mg daily    Choose healthy, lower carb lower calorie snacks: toss salad, cooked vegetables, cottage cheese, peanut butter, low fat cheese / string cheese, lower sodium deli meat, tuna salad or chicken salad

## 2018-08-26 ENCOUNTER — Other Ambulatory Visit: Payer: Self-pay | Admitting: Endocrinology

## 2018-09-03 ENCOUNTER — Other Ambulatory Visit: Payer: Self-pay | Admitting: Endocrinology

## 2018-10-11 DIAGNOSIS — I10 Essential (primary) hypertension: Secondary | ICD-10-CM | POA: Diagnosis not present

## 2018-10-11 DIAGNOSIS — G4733 Obstructive sleep apnea (adult) (pediatric): Secondary | ICD-10-CM | POA: Diagnosis not present

## 2018-10-11 DIAGNOSIS — E1165 Type 2 diabetes mellitus with hyperglycemia: Secondary | ICD-10-CM | POA: Diagnosis not present

## 2018-10-11 DIAGNOSIS — N183 Chronic kidney disease, stage 3 (moderate): Secondary | ICD-10-CM | POA: Diagnosis not present

## 2018-10-11 DIAGNOSIS — E785 Hyperlipidemia, unspecified: Secondary | ICD-10-CM | POA: Diagnosis not present

## 2018-10-11 DIAGNOSIS — E1142 Type 2 diabetes mellitus with diabetic polyneuropathy: Secondary | ICD-10-CM | POA: Diagnosis not present

## 2018-10-11 DIAGNOSIS — I872 Venous insufficiency (chronic) (peripheral): Secondary | ICD-10-CM | POA: Diagnosis not present

## 2018-10-11 DIAGNOSIS — Z9119 Patient's noncompliance with other medical treatment and regimen: Secondary | ICD-10-CM | POA: Diagnosis not present

## 2018-10-11 DIAGNOSIS — R600 Localized edema: Secondary | ICD-10-CM | POA: Diagnosis not present

## 2018-10-11 DIAGNOSIS — F321 Major depressive disorder, single episode, moderate: Secondary | ICD-10-CM | POA: Diagnosis not present

## 2018-10-19 ENCOUNTER — Other Ambulatory Visit: Payer: PPO

## 2018-10-22 ENCOUNTER — Ambulatory Visit: Payer: PPO | Admitting: Endocrinology

## 2018-10-26 ENCOUNTER — Other Ambulatory Visit: Payer: Self-pay | Admitting: Endocrinology

## 2018-10-26 DIAGNOSIS — Z794 Long term (current) use of insulin: Principal | ICD-10-CM

## 2018-10-26 DIAGNOSIS — E782 Mixed hyperlipidemia: Secondary | ICD-10-CM

## 2018-10-26 DIAGNOSIS — E1165 Type 2 diabetes mellitus with hyperglycemia: Secondary | ICD-10-CM

## 2018-10-29 ENCOUNTER — Other Ambulatory Visit (INDEPENDENT_AMBULATORY_CARE_PROVIDER_SITE_OTHER): Payer: PPO

## 2018-10-29 DIAGNOSIS — E1165 Type 2 diabetes mellitus with hyperglycemia: Secondary | ICD-10-CM | POA: Diagnosis not present

## 2018-10-29 DIAGNOSIS — Z794 Long term (current) use of insulin: Secondary | ICD-10-CM

## 2018-10-29 DIAGNOSIS — E782 Mixed hyperlipidemia: Secondary | ICD-10-CM

## 2018-10-29 LAB — COMPREHENSIVE METABOLIC PANEL
ALT: 28 U/L (ref 0–35)
AST: 33 U/L (ref 0–37)
Albumin: 3.8 g/dL (ref 3.5–5.2)
Alkaline Phosphatase: 65 U/L (ref 39–117)
BILIRUBIN TOTAL: 0.4 mg/dL (ref 0.2–1.2)
BUN: 17 mg/dL (ref 6–23)
CHLORIDE: 103 meq/L (ref 96–112)
CO2: 22 meq/L (ref 19–32)
CREATININE: 0.86 mg/dL (ref 0.40–1.20)
Calcium: 9.7 mg/dL (ref 8.4–10.5)
GFR: 66.13 mL/min (ref >60.00)
GLUCOSE: 206 mg/dL — AB (ref 70–99)
Potassium: 4 mEq/L (ref 3.5–5.1)
SODIUM: 136 meq/L (ref 135–145)
Total Protein: 7.8 g/dL (ref 6.0–8.3)

## 2018-10-29 LAB — LIPID PANEL
CHOLESTEROL: 178 mg/dL (ref 0–200)
HDL: 39.7 mg/dL (ref >39.00)
NonHDL: 138.28
Total CHOL/HDL Ratio: 4
Triglycerides: 236 mg/dL — ABNORMAL HIGH (ref 0.0–149.0)
VLDL: 47.2 mg/dL — ABNORMAL HIGH (ref 0.0–40.0)

## 2018-10-29 LAB — LDL CHOLESTEROL, DIRECT: Direct LDL: 109 mg/dL

## 2018-10-29 LAB — HEMOGLOBIN A1C: Hgb A1c MFr Bld: 10.3 % — ABNORMAL HIGH (ref 4.6–6.5)

## 2018-10-29 LAB — MICROALBUMIN / CREATININE URINE RATIO
CREATININE, U: 59.2 mg/dL
MICROALB UR: 9.1 mg/dL — AB (ref 0.0–1.9)
MICROALB/CREAT RATIO: 15.3 mg/g (ref 0.0–30.0)

## 2018-10-31 NOTE — Progress Notes (Deleted)
Patient ID: Melanie Reeves, female   DOB: 09-29-1951, 68 y.o.   MRN: [id number]           Reason for Appointment: Follow-up  for Type 2 Diabetes  Referring physician: Yaakov Guthrie  History of Present Illness:          Date of diagnosis of type 2 diabetes mellitus:  2011       Background history:     She was tested for diabetes when she presented with numbness and tingling in her feet to the health Department. She does not know what her blood sugars were initially  She was however started on both metformin and glipizide at that time. She thinks that in early 2015 she was started on Lantus insulin also developed poor control  Her record indicates A1c levels ranging from 8.2-9.3 and higher at 11.4 in 9/16 She had been started on Victoza in 12/16  Recent history:   INSULIN regimen is: Toujeo 80 U in am, no Novolog   Non-insulin hypoglycemic drugs the patient is taking are: Invokana 300 mg,  Metformin ER 2000 mg daily, off Victoza 1.2 2 months   Current blood sugar patterns and problems identified:  A1c is significantly higher at 9.9  She has not been seen in follow-up since 12/18 and she is now able to come back because of being on Medicare finally  Although she thinks that she is taking her TOUJEO regularly her fasting glucose was 280 in the lab  She thinks otherwise her morning readings are about 170 but has not checked in a month because of lack of test strips  Also have been out of bed to stop for 2 months  Unclear if she is taking her Invokana regularly or not but she thinks she is  She thinks that some of her diet compliance has been poor with portions are getting sweets but not sweet drinks  Usually not checking readings after meals  Weight is about the same   Side effects from medications have been: none  Compliance with the medical regimen: inconsistent Hypoglycemia:  never  Glucose monitoring:        Glucometer: Contour      Blood Glucose readings: Last  fasting glucose at home 170   Self-care: The diet that the patient has been following is: None, not restricting carbohydrates and fats;   eating more starchy foods and bread at times    Meal times: Breakfast:10-11 AM Lunch:4 PM Dinner: 7 pm    Typical meal intake: Breakfast is eggs/ low fat meat..  Lunch.  Evening meal is meat, vegetables,                 Dietician visit, most recent: Years ago               Exercise:  none  Weight history: Lowest 178 lb several years ago, more recently 300-330  Wt Readings from Last 3 Encounters:  08/16/18 (!) 302 lb 9.6 oz (137.3 kg)  06/28/18 (!) 305 lb (138.3 kg)  09/30/17 (!) 308 lb (139.7 kg)   Glycemic control:   Lab Results  Component Value Date   HGBA1C 10.3 (H) 10/29/2018   HGBA1C 9.9 (H) 06/25/2018   HGBA1C 8.3 (H) 09/10/2017   Lab Results  Component Value Date   MICROALBUR 9.1 (H) 10/29/2018   LDLCALC 104 (H) 09/17/2015   CREATININE 0.86 10/29/2018      Lab on 10/29/2018  Component Date Value Ref Range Status  . Cholesterol  10/29/2018 178  0 - 200 mg/dL Final   ATP III Classification       Desirable:  < 200 mg/dL               Borderline High:  200 - 239 mg/dL          High:  > = 240 mg/dL  . Triglycerides 10/29/2018 236.0* 0.0 - 149.0 mg/dL Final   Normal:  <150 mg/dLBorderline High:  150 - 199 mg/dL  . HDL 10/29/2018 39.70  >39.00 mg/dL Final  . VLDL 10/29/2018 47.2* 0.0 - 40.0 mg/dL Final  . Total CHOL/HDL Ratio 10/29/2018 4   Final                  Men          Women1/2 Average Risk     3.4          3.3Average Risk          5.0          4.42X Average Risk          9.6          7.13X Average Risk          15.0          11.0                      . NonHDL 10/29/2018 138.28   Final   NOTE:  Non-HDL goal should be 30 mg/dL higher than patient's LDL goal (i.e. LDL goal of < 70 mg/dL, would have non-HDL goal of < 100 mg/dL)  . Microalb, Ur 10/29/2018 9.1* 0.0 - 1.9 mg/dL Final  . Creatinine,U 10/29/2018 59.2  mg/dL Final  .  Microalb Creat Ratio 10/29/2018 15.3  0.0 - 30.0 mg/g Final  . Sodium 10/29/2018 136  135 - 145 mEq/L Final  . Potassium 10/29/2018 4.0  3.5 - 5.1 mEq/L Final  . Chloride 10/29/2018 103  96 - 112 mEq/L Final  . CO2 10/29/2018 22  19 - 32 mEq/L Final  . Glucose, Bld 10/29/2018 206* 70 - 99 mg/dL Final  . BUN 10/29/2018 17  6 - 23 mg/dL Final  . Creatinine, Ser 10/29/2018 0.86  0.40 - 1.20 mg/dL Final  . Total Bilirubin 10/29/2018 0.4  0.2 - 1.2 mg/dL Final  . Alkaline Phosphatase 10/29/2018 65  39 - 117 U/L Final  . AST 10/29/2018 33  0 - 37 U/L Final  . ALT 10/29/2018 28  0 - 35 U/L Final  . Total Protein 10/29/2018 7.8  6.0 - 8.3 g/dL Final  . Albumin 10/29/2018 3.8  3.5 - 5.2 g/dL Final  . Calcium 10/29/2018 9.7  8.4 - 10.5 mg/dL Final  . GFR 10/29/2018 66.13  >60.00 mL/min Final  . Hgb A1c MFr Bld 10/29/2018 10.3* 4.6 - 6.5 % Final   Glycemic Control Guidelines for People with Diabetes:Non Diabetic:  <6%Goal of Therapy: <7%Additional Action Suggested:  >8%   . Direct LDL 10/29/2018 109.0  mg/dL Final   Optimal:  <100 mg/dLNear or Above Optimal:  100-129 mg/dLBorderline High:  130-159 mg/dLHigh:  160-189 mg/dLVery High:  >190 mg/dL      Allergies as of 11/01/2018      Reactions   Sulfa Antibiotics Other (See Comments)   Cant remember      Medication List       Accurate as of October 31, 2018  9:18 PM. Always use your most recent med list.  ACCU-CHEK FASTCLIX LANCETS Misc Use as instructed to test three times daily. DX: E11.65   atenolol 50 MG tablet Commonly known as:  TENORMIN Take 1 tablet (50 mg total) by mouth 2 (two) times daily.   B-D ULTRAFINE III SHORT PEN 31G X 8 MM Misc Generic drug:  Insulin Pen Needle 1 PACKAGE BY DOES NOT APPLY ROUTE DAILY.   calcitRIOL 0.25 MCG capsule Commonly known as:  ROCALTROL Take 0.25 mcg by mouth daily.   canagliflozin 300 MG Tabs tablet Commonly known as:  INVOKANA TAKE 1 TABLET (300 MG TOTAL) BY MOUTH DAILY BEFORE  BREAKFAST.   CONTOUR NEXT TEST test strip Generic drug:  glucose blood USE TO TEST BLOOD SUGAR 4 TIMES DAILY AS INSTRUCTED   cyclobenzaprine 10 MG tablet Commonly known as:  FLEXERIL Take 10 mg by mouth daily.   DULoxetine 30 MG capsule Commonly known as:  CYMBALTA Take 30 mg by mouth daily.   Fish Oil 1000 MG Caps Take 2 capsules by mouth daily.   gabapentin 600 MG tablet Commonly known as:  NEURONTIN TAKE 1 TABLET BY MOUTH 3 TIMES A DAY AND 1 TABLET AT BEDTIME   HYDROcodone-acetaminophen 5-325 MG tablet Commonly known as:  NORCO/VICODIN   Insulin Glargine 300 UNIT/ML Sopn Commonly known as:  TOUJEO SOLOSTAR Inject 80 Units as directed daily.   TOUJEO SOLOSTAR 300 UNIT/ML Sopn Generic drug:  Insulin Glargine (1 Unit Dial) INJECT 80 UNITS AS DIRECTED DAILY.   latanoprost 0.005 % ophthalmic solution Commonly known as:  XALATAN Place 1 drop into both eyes at bedtime.   liraglutide 18 MG/3ML Sopn Commonly known as:  VICTOZA Inject 0.3 mLs (1.8 mg total) into the skin daily. Inject once daily at the same time   loratadine 10 MG tablet Commonly known as:  CLARITIN Take 10 mg by mouth daily.   metFORMIN 500 MG 24 hr tablet Commonly known as:  GLUCOPHAGE-XR Take 4 tablets (2,000 mg total) by mouth daily with supper.   NOVOLOG FLEXPEN 100 UNIT/ML FlexPen Generic drug:  insulin aspart INJECT 10 UNITS 3 TIMES A DAY WITH MEALS   ONETOUCH VERIO w/Device Kit UAD to monitor glucose tid; Dx: E11.65   XARELTO 20 MG Tabs tablet Generic drug:  rivaroxaban TAKE 1 TABLET (20 MG TOTAL) BY MOUTH DAILY WITH SUPPER.       Allergies:  Allergies  Allergen Reactions  . Sulfa Antibiotics Other (See Comments)    Cant remember    Past Medical History:  Diagnosis Date  . Abnormal electrocardiogram 08/14/2014  . Diabetes mellitus ([place])   . [place] DEGENERATION 12/20/2009   Qualifier: Diagnosis of  By: Aline Brochure MD, Dorothyann Peng    . Dyspnea 08/14/2014  . Hyperlipidemia   .  Hypertension   . LOW BACK PAIN 12/20/2009   Qualifier: Diagnosis of  By: Aline Brochure MD, Dorothyann Peng    . Morbid obesity ([place])   . OSA (obstructive sleep apnea) 08/16/2014  . Seasonal allergies   . SPINAL STENOSIS 12/20/2009   Qualifier: Diagnosis of  By: Aline Brochure MD, Dorothyann Peng      Past Surgical History:  Procedure Laterality Date  . CHOLECYSTECTOMY    . COLONOSCOPY WITH PROPOFOL N/A 11/29/2014   Procedure: COLONOSCOPY WITH PROPOFOL;  Surgeon: Arta Silence, MD;  Location: [facility];  Service: Endoscopy;  Laterality: N/A;  . TONSILLECTOMY      Family History  Problem Relation Age of Onset  . Heart disease Father        MI  . Cancer - Lung Father  Small cell Lung CA    Social History:  reports that she quit smoking about 24 years ago. Her smoking use included cigarettes. She has a 40.00 pack-year smoking history. She has quit using smokeless tobacco. She reports current alcohol use. She reports that she does not use drugs.    Review of Systems    Lipid history:  She is not on any treatment, followed by PCP Triglycerides increased  Reportedly has leg weakness from simvastatin.          .  Most recent eye exam was In 9/18   Hypertension:  On treatment from PCP; taking atenolol as only treatment Her blood pressure was controlled  NEUROPATHY: Has persistent numbness for in her feet the last several years.  Also has twinges of pain Also has weakness in her legs  Currently taking Cymbalta and Neurontin She takes 1200 mg twice a day of gabapentin   Last foot exam in 5/18 showed:  Monofilament sensation is decreased distally on all the toes and distal plantar surfaces    Physical Examination:  There were no vitals taken for this visit.      ASSESSMENT:  Diabetes type 2,  with morbid obesity and long-standing poor control  See history of present illness for detailed discussion of  current management, blood sugar patterns and problems identified  Her A1c is  consistently over 8%, now 9.9  Poor control is from a combination of not consistently watching her diet, probably not taking her diabetes management consistently and especially Victoza Do not think she is taking her Toujeo regularly as fasting reading is markedly increased at 280 Though she thinks she is compliant with his Also doubt if she is taking Invokana regularly Also without any test strips not clear what her blood sugar patterns are including after meals   NEUROPATHY: Symptomatic and on Neurontin She does have more weakness in her legs now  Renal function: Stable  PLAN:    She will increase Toujeo from 80 units up to 90 units for now and may-go to 95 if fasting readings are still over 140 consistently after 1 week  Consider NovoLog but will need to first have her start checking her sugars at various times as discussed and new prescription for test strips given  She will check sugars about 2 hours after at least her main meal and more often after other meals  Blood sugar targets should be at least under 180 after meals  To bring her monitor for download on each visit  Restart Victoza with 0.6 for the first 5 days and then 1.2  Strongly recommended seeing the dietitian but she refuses because of the cost  She will need to try and improve her diet consistently portion control and cutting back on simple sugars  Consider NovoLog again if postprandial readings are consistently high  She will follow-up with her PCP for other nonspecific symptoms including dizziness   There are no Patient Instructions on file for this visit.  Counseling time on subjects discussed in assessment and plan sections is over 50% of today's 25 minute visit    Elayne Snare 10/31/2018, 9:18 PM   Note: This office note was prepared with Dragon voice recognition system technology. Any transcriptional errors that result from this process are unintentional.

## 2018-11-01 ENCOUNTER — Ambulatory Visit: Payer: PPO | Admitting: Endocrinology

## 2018-11-09 ENCOUNTER — Ambulatory Visit: Payer: PPO | Admitting: Endocrinology

## 2018-11-09 ENCOUNTER — Encounter: Payer: Self-pay | Admitting: Endocrinology

## 2018-11-09 VITALS — BP 140/82 | HR 81 | Ht 62.0 in | Wt 300.2 lb

## 2018-11-09 DIAGNOSIS — E1165 Type 2 diabetes mellitus with hyperglycemia: Secondary | ICD-10-CM

## 2018-11-09 DIAGNOSIS — E782 Mixed hyperlipidemia: Secondary | ICD-10-CM

## 2018-11-09 DIAGNOSIS — Z794 Long term (current) use of insulin: Secondary | ICD-10-CM | POA: Diagnosis not present

## 2018-11-09 DIAGNOSIS — I10 Essential (primary) hypertension: Secondary | ICD-10-CM

## 2018-11-09 LAB — GLUCOSE, POCT (MANUAL RESULT ENTRY): POC Glucose: 196 mg/dl — AB (ref 70–99)

## 2018-11-09 MED ORDER — INSULIN ASPART 100 UNIT/ML FLEXPEN: PEN_INJECTOR | SUBCUTANEOUS | 0 refills | Status: DC

## 2018-11-09 MED ORDER — PRAVASTATIN SODIUM 40 MG PO TABS: 40.0000 mg | ORAL_TABLET | Freq: Every day | ORAL | 3 refills | Status: DC

## 2018-11-09 NOTE — Progress Notes (Signed)
Patient ID: Melanie Reeves, female   DOB: [birth year], 68 y.o.   MRN: [id number]           Reason for Appointment: Follow-up  for Type 2 Diabetes  Referring physician: Yaakov Guthrie  History of Present Illness:          Date of diagnosis of type 2 diabetes mellitus:  2011       Background history:     She was tested for diabetes when she presented with numbness and tingling in her feet to the health [facility]. She does not know what her blood sugars were initially  She was however started on both metformin and glipizide at that time. She thinks that in early 2015 she was started on Lantus insulin also developed poor control  Her record indicates A1c levels ranging from 8.2-9.3 and higher at 11.4 in 9/16 She had been started on Victoza in 12/16  Recent history:   INSULIN regimen is: Toujeo 80 U in am, no Novolog   Non-insulin hypoglycemic drugs the patient is taking are: Invokana 300 mg,  Metformin ER 2000 mg daily, Victoza 1.2    Current blood sugar patterns and problems identified:  A1c is significantly higher at 10.3, previously 9.9  She has been recommended improving her lifestyle and diet and also treatment of her depression when seen in the clinic in 11/19 but she did not do so  She is still eating poorly with no control of types of meals or snacks or timing of eating  Even with taking Invokana and metformin along with her basal insulin she has worsening control  Today she had doughnuts and frequently will eat these in the morning  Also checking her blood sugars mostly fasting and not after meals  Her Toujeo was supposed to be increased up to 90 units but she forgot and is only taking 80  She claims that she did not have any NovoLog and therefore has not taken this  She is trying to take Victoza 1.2 mg daily  Her weight is slightly better though  Her blood sugars are averaging over 200 in the morning and likely higher after meals  Currently not able to  exercise   Side effects from medications have been: none  Compliance with the medical regimen: inconsistent Hypoglycemia:  never  Glucose monitoring:        Glucometer:  One Touch       Blood Glucose readings:  FASTING blood sugar range 156-297 with Median about 211 Afternoon 181  Self-care: The diet that the patient has been following is: None, not restricting carbohydrates and fats;   eating more starchy foods and bread at times    Meal times: Breakfast:10-11 AM Lunch:4 PM Dinner: 7 pm    Typical meal intake: Breakfast is eggs/ low fat meat..  Lunch.  Evening meal is meat, vegetables,                 Dietician visit, most recent: Years ago               Exercise:  none  Weight history: Lowest 178 lb several years ago, more recently 300-330  Wt Readings from Last 3 Encounters:  11/09/18 (!) 300 lb 3.2 oz (136.2 kg)  08/16/18 (!) 302 lb 9.6 oz (137.3 kg)  06/28/18 (!) 305 lb (138.3 kg)   Glycemic control:   Lab Results  Component Value Date   HGBA1C 10.3 (H) 10/29/2018   HGBA1C 9.9 (H) 06/25/2018   HGBA1C  8.3 (H) 09/10/2017   Lab Results  Component Value Date   MICROALBUR 9.1 (H) 10/29/2018   LDLCALC 104 (H) 09/17/2015   CREATININE 0.86 10/29/2018      Office Visit on 11/09/2018  Component Date Value Ref Range Status  . POC Glucose 11/09/2018 196* 70 - 99 mg/dl Final      Allergies as of 11/09/2018      Reactions   Sulfa Antibiotics Other (See Comments)   Cant remember      Medication List       Accurate as of November 09, 2018  5:00 PM. Always use your most recent med list.        ACCU-CHEK FASTCLIX LANCETS Misc Use as instructed to test three times daily. DX: E11.65   atenolol 50 MG tablet Commonly known as:  TENORMIN Take 1 tablet (50 mg total) by mouth 2 (two) times daily.   B-D ULTRAFINE III SHORT PEN 31G X 8 MM Misc Generic drug:  Insulin Pen Needle 1 PACKAGE BY DOES NOT APPLY ROUTE DAILY.   canagliflozin 300 MG Tabs tablet Commonly  known as:  INVOKANA TAKE 1 TABLET (300 MG TOTAL) BY MOUTH DAILY BEFORE BREAKFAST.   CONTOUR NEXT TEST test strip Generic drug:  glucose blood USE TO TEST BLOOD SUGAR 4 TIMES DAILY AS INSTRUCTED   cyclobenzaprine 10 MG tablet Commonly known as:  FLEXERIL Take 10 mg by mouth daily.   DULoxetine 60 MG capsule Commonly known as:  CYMBALTA Take 60 mg by mouth daily. TAKE 1 TABLET BY MOUTH ONCE DAILY.   Fish Oil 1000 MG Caps Take 2 capsules by mouth daily.   gabapentin 600 MG tablet Commonly known as:  NEURONTIN TAKE 1 TABLET BY MOUTH 3 TIMES A DAY AND 1 TABLET AT BEDTIME   HYDROcodone-acetaminophen 5-325 MG tablet Commonly known as:  NORCO/VICODIN every 6 (six) hours as needed.   insulin aspart 100 UNIT/ML FlexPen Commonly known as:  NOVOLOG FLEXPEN INJECT 10-15 UNITS 3 TIMES A DAY WITH MEALS   Insulin Glargine 300 UNIT/ML Sopn Commonly known as:  TOUJEO SOLOSTAR Inject 80 Units as directed daily.   latanoprost 0.005 % ophthalmic solution Commonly known as:  XALATAN Place 1 drop into both eyes at bedtime.   liraglutide 18 MG/3ML Sopn Commonly known as:  VICTOZA Inject 0.3 mLs (1.8 mg total) into the skin daily. Inject once daily at the same time   loratadine 10 MG tablet Commonly known as:  CLARITIN Take 10 mg by mouth daily.   metFORMIN 500 MG 24 hr tablet Commonly known as:  GLUCOPHAGE-XR Take 4 tablets (2,000 mg total) by mouth daily with supper.   ONETOUCH VERIO w/Device Kit UAD to monitor glucose tid; Dx: E11.65   pravastatin 40 MG tablet Commonly known as:  PRAVACHOL Take 1 tablet (40 mg total) by mouth daily.   XARELTO 20 MG Tabs tablet Generic drug:  rivaroxaban TAKE 1 TABLET (20 MG TOTAL) BY MOUTH DAILY WITH SUPPER.       Allergies:  Allergies  Allergen Reactions  . Sulfa Antibiotics Other (See Comments)    Cant remember    Past Medical History:  Diagnosis Date  . Abnormal electrocardiogram 08/14/2014  . Diabetes mellitus ([place])   . [place]  DEGENERATION 12/20/2009   Qualifier: Diagnosis of  By: Aline Brochure MD, Dorothyann Peng    . Dyspnea 08/14/2014  . Hyperlipidemia   . Hypertension   . LOW BACK PAIN 12/20/2009   Qualifier: Diagnosis of  By: Aline Brochure MD, Dorothyann Peng    .  Morbid obesity ([place])   . OSA (obstructive sleep apnea) 08/16/2014  . Seasonal allergies   . SPINAL STENOSIS 12/20/2009   Qualifier: Diagnosis of  By: Aline Brochure MD, Dorothyann Peng      Past Surgical History:  Procedure Laterality Date  . CHOLECYSTECTOMY    . COLONOSCOPY WITH PROPOFOL N/A 11/29/2014   Procedure: COLONOSCOPY WITH PROPOFOL;  Surgeon: Arta Silence, MD;  Location: [facility];  Service: Endoscopy;  Laterality: N/A;  . TONSILLECTOMY      Family History  Problem Relation Age of Onset  . Heart disease Father        MI  . Cancer - Lung Father        Small cell Lung CA    Social History:  reports that she quit smoking about 24 years ago. Her smoking use included cigarettes. She has a 40.00 pack-year smoking history. She has quit using smokeless tobacco. She reports current alcohol use. She reports that she does not use drugs.    Review of Systems    Lipid history:  She is not on any treatment  Triglycerides increased  Reportedly has previously had leg weakness from simvastatin that had been given by her PCP.   Lab Results  Component Value Date   CHOL 178 10/29/2018   HDL 39.70 10/29/2018   LDLCALC 104 (H) 09/17/2015   LDLDIRECT 109.0 10/29/2018   TRIG 236.0 (H) 10/29/2018   CHOLHDL 4 10/29/2018            .  Most recent eye exam was In 9/18   Hypertension:  On treatment from PCP; taking atenolol Also on Invokana Her blood pressure is reasonably controlled  BP Readings from Last 3 Encounters:  11/09/18 140/82  08/16/18 128/76  06/28/18 128/82     NEUROPATHY: Has persistent numbness for in her feet the last several years.  Also has twinges of pain Also has weakness in her legs  Currently taking Cymbalta and Neurontin She takes 1200 mg  twice a day of gabapentin   Last foot exam in 5/18 showed:  Monofilament sensation is decreased distally on all the toes and distal plantar surfaces    Physical Examination:  BP 140/82 (BP Location: Right Arm, Patient Position: Sitting, Cuff Size: Large)   Pulse 81   Ht 5' 2" (1.575 m)   Wt (!) 300 lb 3.2 oz (136.2 kg)   SpO2 94%   BMI 54.91 kg/m       ASSESSMENT:  Diabetes type 2,  with morbid obesity and long-standing poor control  See history of present illness for detailed discussion of  current management, blood sugar patterns and problems identified  Her A1c is consistently high and now 10.3  She is both insulin deficient and insulin adjustment She is already taking Victoza, Invokana and metformin but even with 80 units of basal insulin her fasting readings are over 200 Likely has high readings after meals needing mealtime insulin coverage since A1c indicates average blood sugar well over 200 She can also do much better with her diet and is currently not motivated especially with cutting back on sweets As before she needs to be treated for depression Discussed importance of checking blood sugars after meals also which she is not doing  Renal function: Stable on Invokana and no changes in potassium  PLAN:    She will increase Toujeo from 80 units up to 90 units  Discussed that we need to get her fasting blood sugars at least below 140  Start taking NovoLog  10 to 15 units before each meal based on her meal size  Increase Victoza to 1.8 mg  She agrees to see the dietitian for meal planning advised and lifestyle changes   Patient Instructions  Check blood sugars on waking up 3 days a week  Also check blood sugars about 2 hours after meals and do this after different meals by rotation  Recommended blood sugar levels on waking up are 90-130 and about 2 hours after meal is 130-160  Please bring your blood sugar monitor to each visit, thank you  10-15 Novolog  at meals  Toujeo 90  Victoza 1.2m daily       Counseling time on subjects discussed in assessment and plan sections is over 50% of today's 25 minute visit    AElayne Snare1/28/2020, 5:00 PM   Note: This office note was prepared with Dragon voice recognition system technology. Any transcriptional errors that result from this process are unintentional.

## 2018-11-09 NOTE — Patient Instructions (Signed)
Check blood sugars on waking up 3 days a week  Also check blood sugars about 2 hours after meals and do this after different meals by rotation  Recommended blood sugar levels on waking up are 90-130 and about 2 hours after meal is 130-160  Please bring your blood sugar monitor to each visit, thank you  10-15 Novolog at meals  Toujeo 90  Victoza 1.8mg  daily

## 2018-11-29 ENCOUNTER — Other Ambulatory Visit: Payer: Self-pay | Admitting: Endocrinology

## 2018-12-03 ENCOUNTER — Encounter: Payer: PPO | Admitting: Dietician

## 2018-12-10 DIAGNOSIS — N179 Acute kidney failure, unspecified: Secondary | ICD-10-CM | POA: Diagnosis not present

## 2018-12-10 DIAGNOSIS — F321 Major depressive disorder, single episode, moderate: Secondary | ICD-10-CM | POA: Diagnosis not present

## 2018-12-10 DIAGNOSIS — N183 Chronic kidney disease, stage 3 (moderate): Secondary | ICD-10-CM | POA: Diagnosis not present

## 2018-12-10 DIAGNOSIS — I10 Essential (primary) hypertension: Secondary | ICD-10-CM | POA: Diagnosis not present

## 2018-12-10 DIAGNOSIS — M160 Bilateral primary osteoarthritis of hip: Secondary | ICD-10-CM | POA: Diagnosis not present

## 2018-12-10 DIAGNOSIS — M1990 Unspecified osteoarthritis, unspecified site: Secondary | ICD-10-CM | POA: Diagnosis not present

## 2018-12-10 DIAGNOSIS — E785 Hyperlipidemia, unspecified: Secondary | ICD-10-CM | POA: Diagnosis not present

## 2018-12-10 DIAGNOSIS — E1142 Type 2 diabetes mellitus with diabetic polyneuropathy: Secondary | ICD-10-CM | POA: Diagnosis not present

## 2018-12-10 DIAGNOSIS — I4891 Unspecified atrial fibrillation: Secondary | ICD-10-CM | POA: Diagnosis not present

## 2018-12-10 DIAGNOSIS — E1165 Type 2 diabetes mellitus with hyperglycemia: Secondary | ICD-10-CM | POA: Diagnosis not present

## 2018-12-10 DIAGNOSIS — E119 Type 2 diabetes mellitus without complications: Secondary | ICD-10-CM | POA: Diagnosis not present

## 2018-12-13 DIAGNOSIS — F331 Major depressive disorder, recurrent, moderate: Secondary | ICD-10-CM | POA: Diagnosis not present

## 2018-12-13 DIAGNOSIS — F4323 Adjustment disorder with mixed anxiety and depressed mood: Secondary | ICD-10-CM | POA: Diagnosis not present

## 2018-12-15 ENCOUNTER — Other Ambulatory Visit: Payer: Self-pay

## 2018-12-15 ENCOUNTER — Other Ambulatory Visit: Payer: Self-pay | Admitting: Endocrinology

## 2018-12-23 ENCOUNTER — Other Ambulatory Visit: Payer: PPO

## 2018-12-23 ENCOUNTER — Other Ambulatory Visit: Payer: Self-pay | Admitting: Internal Medicine

## 2018-12-23 DIAGNOSIS — F4323 Adjustment disorder with mixed anxiety and depressed mood: Secondary | ICD-10-CM | POA: Diagnosis not present

## 2018-12-23 DIAGNOSIS — F331 Major depressive disorder, recurrent, moderate: Secondary | ICD-10-CM | POA: Diagnosis not present

## 2018-12-23 NOTE — Telephone Encounter (Signed)
Last OV 09/17/17 Scr 0.86 on 10/29/2018 crcl >30ml/min Will send rx for 30 day supply. Patient needs appointment for further refills. Message sent to scheduling to make appointment

## 2018-12-24 ENCOUNTER — Other Ambulatory Visit: Payer: Self-pay | Admitting: Internal Medicine

## 2018-12-24 MED ORDER — RIVAROXABAN 20 MG PO TABS: 20.0000 mg | ORAL_TABLET | Freq: Every day | ORAL | 0 refills | Status: DC

## 2018-12-24 NOTE — Telephone Encounter (Signed)
Age 68, weight 136kg, SCr 0.86 from 10/29/18. CrCl 146mL/min. Last OV 09/17/17, pt overdue for annual follow up, will forward message to scheduling.

## 2018-12-24 NOTE — Addendum Note (Signed)
Addended by: Levin Bacon on: 12/24/2018 03:36 PM   Modules accepted: Orders

## 2018-12-27 DIAGNOSIS — I10 Essential (primary) hypertension: Secondary | ICD-10-CM | POA: Diagnosis not present

## 2018-12-27 DIAGNOSIS — E785 Hyperlipidemia, unspecified: Secondary | ICD-10-CM | POA: Diagnosis not present

## 2018-12-27 DIAGNOSIS — M160 Bilateral primary osteoarthritis of hip: Secondary | ICD-10-CM | POA: Diagnosis not present

## 2018-12-27 DIAGNOSIS — Z0000 Encounter for general adult medical examination without abnormal findings: Secondary | ICD-10-CM | POA: Diagnosis not present

## 2018-12-27 DIAGNOSIS — E1165 Type 2 diabetes mellitus with hyperglycemia: Secondary | ICD-10-CM | POA: Diagnosis not present

## 2018-12-27 DIAGNOSIS — N183 Chronic kidney disease, stage 3 (moderate): Secondary | ICD-10-CM | POA: Diagnosis not present

## 2018-12-27 DIAGNOSIS — E1136 Type 2 diabetes mellitus with diabetic cataract: Secondary | ICD-10-CM | POA: Diagnosis not present

## 2018-12-27 DIAGNOSIS — I4891 Unspecified atrial fibrillation: Secondary | ICD-10-CM | POA: Diagnosis not present

## 2018-12-27 DIAGNOSIS — E1142 Type 2 diabetes mellitus with diabetic polyneuropathy: Secondary | ICD-10-CM | POA: Diagnosis not present

## 2018-12-27 DIAGNOSIS — Z1159 Encounter for screening for other viral diseases: Secondary | ICD-10-CM | POA: Diagnosis not present

## 2018-12-27 DIAGNOSIS — Z23 Encounter for immunization: Secondary | ICD-10-CM | POA: Diagnosis not present

## 2018-12-27 DIAGNOSIS — Z6841 Body Mass Index (BMI) 40.0 and over, adult: Secondary | ICD-10-CM | POA: Diagnosis not present

## 2018-12-27 DIAGNOSIS — B354 Tinea corporis: Secondary | ICD-10-CM | POA: Diagnosis not present

## 2018-12-27 DIAGNOSIS — M1990 Unspecified osteoarthritis, unspecified site: Secondary | ICD-10-CM | POA: Diagnosis not present

## 2018-12-28 ENCOUNTER — Other Ambulatory Visit: Payer: Self-pay

## 2018-12-28 ENCOUNTER — Encounter: Payer: PPO | Attending: Endocrinology | Admitting: Dietician

## 2018-12-28 ENCOUNTER — Encounter: Payer: Self-pay | Admitting: Dietician

## 2018-12-28 DIAGNOSIS — E118 Type 2 diabetes mellitus with unspecified complications: Secondary | ICD-10-CM | POA: Diagnosis not present

## 2018-12-28 DIAGNOSIS — IMO0002 Reserved for concepts with insufficient information to code with codable children: Secondary | ICD-10-CM

## 2018-12-28 DIAGNOSIS — E1165 Type 2 diabetes mellitus with hyperglycemia: Secondary | ICD-10-CM | POA: Diagnosis not present

## 2018-12-28 NOTE — Patient Instructions (Signed)
Eat at your table -   Put a note on your table to remind you to take the Novolog. Consider reading Elsie Amis book Consider choosing one change every month to make (or more as you feel you can).  Drink more water   "Just because it is on my plate doesn't mean I need to eat it."  Eat slowly.  Take smaller bites.  Chew your food well. (savor the flavor, smell)  Consider a 8-12 hour eating window  Consider the "butter spray" or other tips to reduce the margarine on your food. Consider discussing your sleep with your MD. Consider having your husband serve smaller portions Find things that bring you joy.  What brings you joy rather than food?  Being with friends  Talking about God "Eat to Live, not Live to Eat"  Before you eat a snack, ask, "Am I hungry or eating for another reason?"  If I want to snack when not hungry I can...   Crochet   Play games on my iPad   Listen to books on tape Continue to work with your psychologist. Goal- "Maintain my independence." Consider Armchair exercises

## 2018-12-28 NOTE — Progress Notes (Signed)
Diabetes Self-Management Education  Visit Type: First/Initial  Appt. Start Time: 20 (Patient was late) Appt. End Time: 1550  12/30/2018  [name], identified by name and date of birth, is a 68 y.o. female with a diagnosis of Diabetes: Type 2.   ASSESSMENT Patient is here today with husband. She would like to learn more about eating. She states that she consciously works on eating slowly. History includes HDL, HTN, OSA (does not use c-pap as she drools when she sleeps and makes this uncomfortable).  She also has depression and sees a psychologist.  She states that she does not have any motivation to change.  She notes emotional eating and states that she has been working with her psychologist for this.   She checks her BG infrequently.  Weight hx 300 lbs in January, recent UBW 300-332 lbs, 178 lbs lowest weight several years ago.  Medications include:  Toujeo 100 units q am, Novolog 20 units "when I remember" (often forgets),  Victoza 1.8, Invokana, Metformin XR  She does not sleep well and mixes up her nights and days.   Back problems and Steroid Shot in June. Patient lives with her husband.  They share shopping and her husband cooks.  He follows a plant based whole food diet.  He has lost 50 lbs since starting to eat this way.  She states that she likes meat too much and her husband is willing to cook her anything she wishes.  He makes her plate and serves large portions per patient.  She is in a wheel chair today but usually walks with a cane.  She cannot walk long distances.  She has been depressed more recently as she cannot do as she wishes physically to care for her mother or other things.  Patient is retired from the [employer].    Patient transferred back to the wheel chair after the appointment today, complained of feeling dizzy.  Vitals:  BG 257, Blood pressure 152/80, pulse 86.  Patient was left without incidence.   Diabetes Self-Management Education -  12/28/18 1435      Visit Information   Visit Type  First/Initial      Initial Visit   Diabetes Type  Type 2    Are you currently following a meal plan?  No    Are you taking your medications as prescribed?  Yes    Date Diagnosed  2011      Health Coping   How would you rate your overall health?  Poor      Psychosocial Assessment   Patient Belief/Attitude about Diabetes  Defeat/Burnout    Self-care barriers  Debilitated state due to current medical condition    Self-management support  Doctor's office;Family    Other persons present  Patient    Patient Concerns  Nutrition/Meal planning;Weight Control;Healthy Lifestyle    Special Needs  None    Preferred Learning Style  No preference indicated    Learning Readiness  Ready    How often do you need to have someone help you when you read instructions, pamphlets, or other written materials from your doctor or pharmacy?  1 - Never    What is the last grade level you completed in school?  12th grade      Pre-Education Assessment   Patient understands the diabetes disease and treatment process.  Needs Review    Patient understands incorporating nutritional management into lifestyle.  Needs Review    Patient undertands incorporating physical activity  into lifestyle.  Needs Review    Patient understands using medications safely.  Needs Review    Patient understands monitoring blood glucose, interpreting and using results  Needs Review    Patient understands prevention, detection, and treatment of acute complications.  Needs Review    Patient understands prevention, detection, and treatment of chronic complications.  Needs Review    Patient understands how to develop strategies to address psychosocial issues.  Needs Review    Patient understands how to develop strategies to promote health/change behavior.  Needs Review      Complications   Last HgB A1C per patient/outside source  10.3 %   10/29/2018 increased from 9.9% 06/25/18   How often  do you check your blood sugar?  --   occasionally   Fasting Blood glucose range (mg/dL)  >161   096 yesterday am   Number of hypoglycemic episodes per month  0    Number of hyperglycemic episodes per week  --   daily   Have you had a dilated eye exam in the past 12 months?  Yes    Have you had a dental exam in the past 12 months?  No    Are you checking your feet?  No      Dietary Intake   Breakfast  Roast beef and cheese sandwich with mayo on white bread   1 pm   Snack (morning)  none    Lunch  TV dinner OR vegetable plate OR sandwich   5-7   Snack (afternoon)  none    Dinner  vegetable stirfry and sweet and sour TV dinner   9   Snack (evening)  cheeze nabs OR pork skins OR potato chips, trail mix or peanuts OR M&M's    Beverage(s)  diet soda, water, occasional coffee with 4 tsp sugar and creamer      Exercise   Exercise Type  ADL's      Patient Education   Previous Diabetes Education  Yes (please comment)   2 years ago with RD   Disease state   Definition of diabetes, type 1 and 2, and the diagnosis of diabetes    Nutrition management   Role of diet in the treatment of diabetes and the relationship between the three main macronutrients and blood glucose level;Meal options for control of blood glucose level and chronic complications.;Other (comment)   plant based eating   Physical activity and exercise   Helped patient identify appropriate exercises in relation to his/her diabetes, diabetes complications and other health issue.;Role of exercise on diabetes management, blood pressure control and cardiac health.    Medications  Reviewed patients medication for diabetes, action, purpose, timing of dose and side effects.    Monitoring  Purpose and frequency of SMBG.    Acute complications  Taught treatment of hypoglycemia - the 15 rule.    Chronic complications  Relationship between chronic complications and blood glucose control    Psychosocial adjustment  Worked with patient to  identify barriers to care and solutions;Role of stress on diabetes    Personal strategies to promote health  Lifestyle issues that need to be addressed for better diabetes care      Individualized Goals (developed by patient)   Nutrition  General guidelines for healthy choices and portions discussed    Physical Activity  Exercise 3-5 times per week;15 minutes per day   discussed armchair exercises   Monitoring   test my blood glucose as discussed    Reducing  Risk  increase portions of healthy fats    Health Coping  discuss diabetes with (comment)   MD, RD, CDE     Post-Education Assessment   Patient understands the diabetes disease and treatment process.  Demonstrates understanding / competency    Patient understands incorporating nutritional management into lifestyle.  Demonstrates understanding / competency    Patient undertands incorporating physical activity into lifestyle.  Demonstrates understanding / competency    Patient understands using medications safely.  Demonstrates understanding / competency    Patient understands monitoring blood glucose, interpreting and using results  Demonstrates understanding / competency    Patient understands prevention, detection, and treatment of acute complications.  Demonstrates understanding / competency    Patient understands prevention, detection, and treatment of chronic complications.  Demonstrates understanding / competency    Patient understands how to develop strategies to address psychosocial issues.  Demonstrates understanding / competency    Patient understands how to develop strategies to promote health/change behavior.  Needs Review      Outcomes   Expected Outcomes  Demonstrated interest in learning. Expect positive outcomes    Future DMSE  4-6 wks    Program Status  Completed       Individualized Plan for Diabetes Self-Management Training:   Learning Objective:  Patient will have a greater understanding of diabetes  self-management. Patient education plan is to attend individual and/or group sessions per assessed needs and concerns.   Plan:   Patient Instructions  Eat at your table -   Put a note on your table to remind you to take the Novolog. Consider reading Elsie Amis book Consider choosing one change every month to make (or more as you feel you can).  Drink more water   "Just because it is on my plate doesn't mean I need to eat it."  Eat slowly.  Take smaller bites.  Chew your food well. (savor the flavor, smell)  Consider a 8-12 hour eating window  Consider the "butter spray" or other tips to reduce the margarine on your food. Consider discussing your sleep with your MD. Consider having your husband serve smaller portions Find things that bring you joy.  What brings you joy rather than food?  Being with friends  Talking about God "Eat to Live, not Live to Eat"  Before you eat a snack, ask, "Am I hungry or eating for another reason?"  If I want to snack when not hungry I can...   Crochet   Play games on my iPad   Listen to books on tape Continue to work with your psychologist. Goal- "Maintain my independence." Consider Armchair exercises   Expected Outcomes:  Demonstrated interest in learning. Expect positive outcomes  Education material provided: Meal plan card and Snack sheet, label reading, plant based primer, diabetes resources, How to Thrive:  A Guide for Your Journey with Diabetes  If problems or questions, patient to contact team via:  Phone  Future DSME appointment: 4-6 wks

## 2018-12-31 ENCOUNTER — Ambulatory Visit: Payer: PPO | Admitting: Endocrinology

## 2019-01-01 ENCOUNTER — Other Ambulatory Visit: Payer: Self-pay | Admitting: Internal Medicine

## 2019-01-01 ENCOUNTER — Other Ambulatory Visit: Payer: Self-pay | Admitting: Endocrinology

## 2019-01-03 DIAGNOSIS — F4323 Adjustment disorder with mixed anxiety and depressed mood: Secondary | ICD-10-CM | POA: Diagnosis not present

## 2019-01-03 DIAGNOSIS — F331 Major depressive disorder, recurrent, moderate: Secondary | ICD-10-CM | POA: Diagnosis not present

## 2019-01-04 ENCOUNTER — Other Ambulatory Visit: Payer: Self-pay

## 2019-01-04 MED ORDER — ACCU-CHEK FASTCLIX LANCETS MISC: 0 refills | Status: DC

## 2019-01-04 MED ORDER — CANAGLIFLOZIN 300 MG PO TABS: ORAL_TABLET | ORAL | 3 refills | Status: DC

## 2019-01-04 MED ORDER — INSULIN ASPART 100 UNIT/ML FLEXPEN: PEN_INJECTOR | SUBCUTANEOUS | 0 refills | Status: DC

## 2019-01-04 MED ORDER — METFORMIN HCL ER 500 MG PO TB24: 2000.0000 mg | ORAL_TABLET | Freq: Every day | ORAL | 1 refills | Status: DC

## 2019-01-04 MED ORDER — GABAPENTIN 600 MG PO TABS: ORAL_TABLET | ORAL | 2 refills | Status: DC

## 2019-01-04 MED ORDER — PRAVASTATIN SODIUM 40 MG PO TABS: 40.0000 mg | ORAL_TABLET | Freq: Every day | ORAL | 3 refills | Status: DC

## 2019-01-04 MED ORDER — GLUCOSE BLOOD VI STRP: ORAL_STRIP | 3 refills | Status: DC

## 2019-01-04 MED ORDER — INSULIN PEN NEEDLE 31G X 8 MM MISC: 3 refills | Status: DC

## 2019-01-04 MED ORDER — LIRAGLUTIDE 18 MG/3ML ~~LOC~~ SOPN: PEN_INJECTOR | SUBCUTANEOUS | 3 refills | Status: DC

## 2019-01-10 ENCOUNTER — Telehealth: Payer: Self-pay | Admitting: Cardiology

## 2019-01-10 NOTE — Telephone Encounter (Signed)
New Message          Patient is returning your  Call for  Her appt visit, pls call to advise.

## 2019-01-10 NOTE — Telephone Encounter (Signed)
Left message for patient to call back regarding appointment with Lizabeth Leyden, NP on 3/31. Call placed due to restrictions enacted for Covid 19.

## 2019-01-11 ENCOUNTER — Other Ambulatory Visit (INDEPENDENT_AMBULATORY_CARE_PROVIDER_SITE_OTHER): Payer: PPO

## 2019-01-11 ENCOUNTER — Other Ambulatory Visit: Payer: Self-pay

## 2019-01-11 DIAGNOSIS — E785 Hyperlipidemia, unspecified: Secondary | ICD-10-CM | POA: Diagnosis not present

## 2019-01-11 DIAGNOSIS — I10 Essential (primary) hypertension: Secondary | ICD-10-CM | POA: Diagnosis not present

## 2019-01-11 DIAGNOSIS — Z794 Long term (current) use of insulin: Secondary | ICD-10-CM | POA: Diagnosis not present

## 2019-01-11 DIAGNOSIS — E1165 Type 2 diabetes mellitus with hyperglycemia: Secondary | ICD-10-CM

## 2019-01-11 DIAGNOSIS — E782 Mixed hyperlipidemia: Secondary | ICD-10-CM

## 2019-01-11 DIAGNOSIS — N183 Chronic kidney disease, stage 3 (moderate): Secondary | ICD-10-CM | POA: Diagnosis not present

## 2019-01-11 DIAGNOSIS — F321 Major depressive disorder, single episode, moderate: Secondary | ICD-10-CM | POA: Diagnosis not present

## 2019-01-11 DIAGNOSIS — E1142 Type 2 diabetes mellitus with diabetic polyneuropathy: Secondary | ICD-10-CM | POA: Diagnosis not present

## 2019-01-11 DIAGNOSIS — M1990 Unspecified osteoarthritis, unspecified site: Secondary | ICD-10-CM | POA: Diagnosis not present

## 2019-01-11 DIAGNOSIS — E119 Type 2 diabetes mellitus without complications: Secondary | ICD-10-CM | POA: Diagnosis not present

## 2019-01-11 DIAGNOSIS — N179 Acute kidney failure, unspecified: Secondary | ICD-10-CM | POA: Diagnosis not present

## 2019-01-11 DIAGNOSIS — I4891 Unspecified atrial fibrillation: Secondary | ICD-10-CM | POA: Diagnosis not present

## 2019-01-11 DIAGNOSIS — E1136 Type 2 diabetes mellitus with diabetic cataract: Secondary | ICD-10-CM | POA: Diagnosis not present

## 2019-01-11 LAB — COMPREHENSIVE METABOLIC PANEL
ALBUMIN: 3.9 g/dL (ref 3.5–5.2)
ALT: 23 U/L (ref 0–35)
AST: 28 U/L (ref 0–37)
Alkaline Phosphatase: 62 U/L (ref 39–117)
BUN: 32 mg/dL — AB (ref 6–23)
CO2: 19 mEq/L (ref 19–32)
Calcium: 9.7 mg/dL (ref 8.4–10.5)
Chloride: 102 mEq/L (ref 96–112)
Creatinine, Ser: 1.14 mg/dL (ref 0.40–1.20)
GFR: 47.74 mL/min — ABNORMAL LOW (ref >60.00)
Glucose, Bld: 202 mg/dL — ABNORMAL HIGH (ref 70–99)
Potassium: 3.9 mEq/L (ref 3.5–5.1)
Sodium: 136 mEq/L (ref 135–145)
Total Bilirubin: 0.3 mg/dL (ref 0.2–1.2)
Total Protein: 7.6 g/dL (ref 6.0–8.3)

## 2019-01-11 LAB — LIPID PANEL
Cholesterol: 158 mg/dL (ref 0–200)
HDL: 37.1 mg/dL — ABNORMAL LOW (ref >39.00)
Total CHOL/HDL Ratio: 4

## 2019-01-11 LAB — LDL CHOLESTEROL, DIRECT: LDL DIRECT: 83 mg/dL

## 2019-01-12 LAB — FRUCTOSAMINE: Fructosamine: 273 umol/L (ref 0–285)

## 2019-01-13 ENCOUNTER — Other Ambulatory Visit: Payer: Self-pay | Admitting: Endocrinology

## 2019-01-13 ENCOUNTER — Ambulatory Visit: Payer: PPO | Admitting: Endocrinology

## 2019-01-13 ENCOUNTER — Encounter: Payer: Self-pay | Admitting: Cardiology

## 2019-01-13 ENCOUNTER — Encounter: Payer: Self-pay | Admitting: Endocrinology

## 2019-01-13 ENCOUNTER — Other Ambulatory Visit: Payer: Self-pay

## 2019-01-13 VITALS — BP 110/60 | HR 90 | Ht 62.0 in | Wt 302.2 lb

## 2019-01-13 DIAGNOSIS — E1165 Type 2 diabetes mellitus with hyperglycemia: Secondary | ICD-10-CM

## 2019-01-13 DIAGNOSIS — E782 Mixed hyperlipidemia: Secondary | ICD-10-CM

## 2019-01-13 DIAGNOSIS — Z794 Long term (current) use of insulin: Secondary | ICD-10-CM | POA: Diagnosis not present

## 2019-01-13 MED ORDER — FENOFIBRATE 145 MG PO TABS: 145.0000 mg | ORAL_TABLET | Freq: Every day | ORAL | 3 refills | Status: DC

## 2019-01-13 NOTE — Patient Instructions (Addendum)
Take NOVOLOG 20 UNITS BEFORE SUPPER AND 15 UNITS IN AM  LESS MEATS AND NO SWEETS  More water  TOUJEO 120 UNITS AND KEEP AM SUGARS BELOW 140

## 2019-01-13 NOTE — Progress Notes (Signed)
Patient ID: Melanie Reeves, female   DOB: 01-16-1951, 68 y.o.   MRN: [id number]           Reason for Appointment: Follow-up  for Type 2 Diabetes  Referring physician: Yaakov Guthrie  History of Present Illness:          Date of diagnosis of type 2 diabetes mellitus:  2011       Background history:     She was tested for diabetes when she presented with numbness and tingling in her feet to the [facility]. She does not know what her blood sugars were initially  She was however started on both metformin and glipizide at that time. She thinks that in early 2015 she was started on Lantus insulin also developed poor control  Her record indicates A1c levels ranging from 8.2-9.3 and higher at 11.4 in 9/16 She had been started on Victoza in 12/16  Recent history:   INSULIN regimen is: Toujeo 100 U in am  Non-insulin hypoglycemic drugs the patient is taking are: Invokana 300 mg,  Metformin ER 2000 mg daily, Victoza 1.90m  Her last A1c was 10.3 done in January  Current blood sugar patterns and problems identified:  Although she has increased her basal insulin 20 units her fasting blood sugars are still about the same and are averaging over 200  She was told to take NovoLog with her meals but not clear why she forgets to do this  Also she has had difficulty watching her diet and despite seeing the dietitian she may not be consistently improving her meal planning  Her husband does the cooking and may be getting larger portions, higher fat meals with items like butter although she is trying to cut back on sweets and donuts  Is also increased her Victoza up to 1.8 mg  She thinks she is taking her Invokana daily  Her weight is about the same  She does check occasional readings after meals and these are not consistent  Currently has a large supply of her Toujeo and is not able to change   Side effects from medications have been: none  Compliance with the medical regimen:  inconsistent Hypoglycemia:  never  Glucose monitoring:        Glucometer:  One Touch       Blood Glucose readings:   PRE-MEAL Fasting Lunch Dinner Bedtime Overall  Glucose range:  192-302    195-313   Mean/median:  230    222  228     Self-care: The diet that the patient has been following is: None, not restricting carbohydrates and fats;   eating more starchy foods and bread at times    Meal times: Breakfast:10-11 AM Lunch:4 PM Dinner: 7 pm    Typical meal intake: Breakfast is eggs/ low fat meat..  Lunch.  Evening meal is meat, vegetables,                 Dietician visit, most recent: Years ago               Exercise:  none  Weight history: Lowest 178 lb several years ago, more recently 300-330  Wt Readings from Last 3 Encounters:  01/13/19 (!) 302 lb 3.2 oz (137.1 kg)  11/09/18 (!) 300 lb 3.2 oz (136.2 kg)  08/16/18 (!) 302 lb 9.6 oz (137.3 kg)   Glycemic control:   Lab Results  Component Value Date   HGBA1C 10.3 (H) 10/29/2018   HGBA1C 9.9 (H) 06/25/2018  HGBA1C 8.3 (H) 09/10/2017   Lab Results  Component Value Date   MICROALBUR 9.1 (H) 10/29/2018   LDLCALC 104 (H) 09/17/2015   CREATININE 1.14 01/11/2019      Lab on 01/11/2019  Component Date Value Ref Range Status  . Cholesterol 01/11/2019 158  0 - 200 mg/dL Final   ATP III Classification       Desirable:  < 200 mg/dL               Borderline High:  200 - 239 mg/dL          High:  > = 240 mg/dL  . Triglycerides 01/11/2019 462.0 Triglyceride is over 400; calculations on Lipids are invalid.* 0.0 - 149.0 mg/dL Final   Normal:  <150 mg/dLBorderline High:  150 - 199 mg/dL  . HDL 01/11/2019 37.10* >39.00 mg/dL Final  . Total CHOL/HDL Ratio 01/11/2019 4   Final                  Men          Women1/2 Average Risk     3.4          3.3Average Risk          5.0          4.42X Average Risk          9.6          7.13X Average Risk          15.0          11.0                      . Fructosamine 01/11/2019 273  0 - 285  umol/L Final   Comment: Published reference interval for apparently healthy subjects between age 75 and 42 is 58 - 285 umol/L and in a poorly controlled diabetic population is 228 - 563 umol/L with a mean of 396 umol/L.   Marland Kitchen Sodium 01/11/2019 136  135 - 145 mEq/L Final  . Potassium 01/11/2019 3.9  3.5 - 5.1 mEq/L Final  . Chloride 01/11/2019 102  96 - 112 mEq/L Final  . CO2 01/11/2019 19  19 - 32 mEq/L Final  . Glucose, Bld 01/11/2019 202* 70 - 99 mg/dL Final  . BUN 01/11/2019 32* 6 - 23 mg/dL Final  . Creatinine, Ser 01/11/2019 1.14  0.40 - 1.20 mg/dL Final  . Total Bilirubin 01/11/2019 0.3  0.2 - 1.2 mg/dL Final  . Alkaline Phosphatase 01/11/2019 62  39 - 117 U/L Final  . AST 01/11/2019 28  0 - 37 U/L Final  . ALT 01/11/2019 23  0 - 35 U/L Final  . Total Protein 01/11/2019 7.6  6.0 - 8.3 g/dL Final  . Albumin 01/11/2019 3.9  3.5 - 5.2 g/dL Final  . Calcium 01/11/2019 9.7  8.4 - 10.5 mg/dL Final  . GFR 01/11/2019 47.74* >60.00 mL/min Final  . Direct LDL 01/11/2019 83.0  mg/dL Final   Optimal:  <100 mg/dLNear or Above Optimal:  100-129 mg/dLBorderline High:  130-159 mg/dLHigh:  160-189 mg/dLVery High:  >190 mg/dL      Allergies as of 01/13/2019      Reactions   Sulfa Antibiotics Other (See Comments)   Cant remember      Medication List       Accurate as of January 13, 2019  1:53 PM. Always use your most recent med list.        Accu-Chek FastClix Lancets Misc  Use as instructed to test three times daily. DX: E11.65   atenolol 50 MG tablet Commonly known as:  TENORMIN TAKE 1 TABLET BY MOUTH TWICE A DAY   canagliflozin 300 MG Tabs tablet Commonly known as:  Invokana TAKE 1 TABLET (300 MG TOTAL) BY MOUTH DAILY BEFORE BREAKFAST.   cyclobenzaprine 10 MG tablet Commonly known as:  FLEXERIL Take 10 mg by mouth daily.   DULoxetine 60 MG capsule Commonly known as:  CYMBALTA Take 60 mg by mouth daily. TAKE 1 TABLET BY MOUTH ONCE DAILY.   Fish Oil 1000 MG Caps Take 2  capsules by mouth daily.   gabapentin 600 MG tablet Commonly known as:  NEURONTIN Take 1 tablet by mouth four times daily.   glucose blood test strip Commonly known as:  ONE TOUCH ULTRA TEST Use as instructed to check blood sugar three times daily.   HYDROcodone-acetaminophen 5-325 MG tablet Commonly known as:  NORCO/VICODIN every 6 (six) hours as needed.   Insulin Pen Needle 31G X 8 MM Misc Commonly known as:  B-D ULTRAFINE III SHORT PEN Use as instructed to inject insulin 5 times daily.   latanoprost 0.005 % ophthalmic solution Commonly known as:  XALATAN Place 1 drop into both eyes at bedtime.   liraglutide 18 MG/3ML Sopn Commonly known as:  Victoza Inject 1.75m under the skin once daily at the same time   loratadine 10 MG tablet Commonly known as:  CLARITIN Take 10 mg by mouth daily.   metFORMIN 500 MG 24 hr tablet Commonly known as:  GLUCOPHAGE-XR Take 4 tablets (2,000 mg total) by mouth daily with supper.   NovoLOG FlexPen 100 UNIT/ML FlexPen Generic drug:  insulin aspart INJECT 10-15 UNITS 3 TIMES A DAY WITH MEALS   OneTouch Verio w/Device Kit UAD to monitor glucose tid; Dx: E11.65   pravastatin 40 MG tablet Commonly known as:  PRAVACHOL Take 1 tablet (40 mg total) by mouth daily.   rivaroxaban 20 MG Tabs tablet Commonly known as:  Xarelto Take 1 tablet (20 mg total) by mouth daily with supper.   Toujeo Max SoloStar 300 UNIT/ML Sopn Generic drug:  Insulin Glargine (2 Unit Dial) Inject 100 Units into the skin daily. Inject 100 units under the skin once daily.       Allergies:  Allergies  Allergen Reactions  . Sulfa Antibiotics Other (See Comments)    Cant remember    Past Medical History:  Diagnosis Date  . Abnormal electrocardiogram 08/14/2014  . [place]   . D[place] A[place]DEGENERATION 12/20/2009   Qualifier: Diagnosis of  By: HAline BrochureMD, SDorothyann Peng   . Dyspnea 08/14/2014  . Hyperlipidemia   . Hypertension   . LOW BACK PAIN 12/20/2009    Qualifier: Diagnosis of  By: HAline BrochureMD, SDorothyann Peng   . Morbid obesity ([facility]   . OSA (obstructive sleep apnea) 08/16/2014  . Seasonal allergies   . SPINAL STENOSIS 12/20/2009   Qualifier: Diagnosis of  By: HAline BrochureMD, SDorothyann Peng     Past Surgical History:  Procedure Laterality Date  . CHOLECYSTECTOMY    . COLONOSCOPY WITH PROPOFOL N/A 11/29/2014   Procedure: COLONOSCOPY WITH PROPOFOL;  Surgeon: WArta Silence MD;  Location: [facility];  Service: Endoscopy;  Laterality: N/A;  . TONSILLECTOMY      Family History  Problem Relation Age of Onset  . Heart disease Father        MI  . Cancer - Lung Father        Small cell  Lung CA    Social History:  reports that she quit smoking about 25 years ago. Her smoking use included cigarettes. She has a 40.00 pack-year smoking history. She has quit using smokeless tobacco. She reports current alcohol use. She reports that she does not use drugs.    Review of Systems    Lipid history:    Triglycerides increased persistently.  She is taking pravastatin 4 days a week since she had intolerance to simvastatin before LDL is below 100     Lab Results  Component Value Date   CHOL 158 01/11/2019   HDL 37.10 (L) 01/11/2019   LDLCALC 104 (H) 09/17/2015   LDLDIRECT 83.0 01/11/2019   TRIG (H) 01/11/2019    462.0 Triglyceride is over 400; calculations on Lipids are invalid.   CHOLHDL 4 01/11/2019            .  Most recent eye exam was In 9/18, she does not know the eye doctor's name   Hypertension:  On treatment from PCP; taking atenolol Also on Invokana Her blood pressure is good  BP Readings from Last 3 Encounters:  01/13/19 110/60  11/09/18 140/82  08/16/18 128/76     NEUROPATHY: Has persistent numbness for in her feet the last several years.  Also has twinges of pain Also has weakness in her legs  Currently taking Cymbalta and Neurontin She takes 1200 mg twice a day of gabapentin   Last foot exam in 3/20 done by her PCP,  previous findings:  Monofilament sensation is decreased distally on all the toes and distal plantar surfaces    Physical Examination:  BP 110/60 (BP Location: Left Arm, Patient Position: Sitting, Cuff Size: Large)   Pulse 90   Ht '5\' 2"'  (1.575 m)   Wt (!) 302 lb 3.2 oz (137.1 kg)   SpO2 98%   BMI 55.27 kg/m       ASSESSMENT:  Diabetes type 2,  with morbid obesity and long-standing poor control  See history of present illness for detailed discussion of  current management, blood sugar patterns and problems identified  Her A1c is consistently high  She does need basal bolus insulin but is only taking Toujeo She is not consistently compliant with her instructions Not clear why she forgets to take her NovoLog which she was told to take and she does have it with her when she is going out and also at her bedside where she has her meals  She is already taking Victoza, Invokana and metformin Increasing her basal insulin by 20 units has not improved her morning readings which are still over 200 She has seen the dietitian  Again despite instructions she is not able to consistently control high-fat meals and occasionally sweets also  LIPIDS: Marked increase in triglycerides related to insulin resistance, obesity, poor diabetes control Does need additional treatment LDL below 100 with pravastatin taken 4 days a week  PLAN:    She will increase Toujeo up to 120 units  She can adjusted further every week to get her morning sugar below 140  Emphasized the need to take mealtime insulin consistently with 15 at breakfast and 20 at suppertime  Written instructions given  Does need to check more readings after meals rather than in the morning Continue to improve diet with lower fat content and smaller portions Fenofibrate 145 mg daily She will continue to follow-up with PCP for other problems and hypertension  There are no Patient Instructions on file for this visit.  Counseling  time on subjects discussed in assessment and plan sections is over 50% of today's 25 minute visit     Elayne Snare 01/13/2019, 1:53 PM   Note: This office note was prepared with Dragon voice recognition system technology. Any transcriptional errors that result from this process are unintentional.

## 2019-01-13 NOTE — Progress Notes (Signed)
Virtual Visit via Telephone Note    Evaluation Performed:  Follow-up visit  This visit type was conducted due to national recommendations for restrictions regarding the COVID-19 Pandemic (e.g. social distancing).  This format is felt to be most appropriate for this patient at this time.  All issues noted in this document were discussed and addressed.  No physical exam was performed (except for noted visual exam findings with Video Visits).  Please refer to the patient's chart (MyChart message for video visits and phone note for telephone visits) for the patient's consent to telehealth for [facility].  Date:  01/14/2019   ID:  Melanie Reeves, DOB 1951-10-06, MRN [id number]  Patient Location:  Home  Provider location:   Home  PCP:  Vernie Shanks, MD  Cardiologist:  Dorris Carnes, MD  Electrophysiologist:  None   Chief Complaint:  Follow up atrial fibrillation  History of Present Illness:    Melanie Reeves is a 68 y.o. female who presents via audio conferencing for a telehealth visit today.    The patient does not have symptoms concerning for COVID-19 infection (fever, chills, cough, or new shortness of breath).   Pt has hx of atrial fibrillation found on event monitor in 10/2017. She has hx of normal EF by echo and normal myovue in 09/2017. She has known LBBB. Pt was placed on DOAC for stroke risk reduction with CHADSVASc of 3. She also has diabetes, obesity, OSA, hypertension and hyperlipidemia.   On the phone today she tells me that She complained of chest tightness a couple of weeks ago and her PCP checked an EKG which she says was unremarkable. She occ feels palpitations and her apple watch shows occ increased HR up to the 110's. This only lasts for at most a few minutes. She gets occ chest tightness. Her apple watch has not shown that she is in afib lately.   She mostly stays in her home and does very little physical activity. Due to her wt she has back, hip and knee pain. She  has DOE with little exertion.   She is seeing a nutritionist and psychiatrist. She is working on wt loss. Trying to eat smaller portions, les sweets, increased fruits and vegetables. Her goal wt is 175 lbs.   She has sleep apnea and has a CPAP but does not use it because she drools in her mask and does not like to get all wet. She says that she was told she only has a mild case of sleep apnea.    VS at IM office visit on 01/13/2019: BP 110/60, P 90, Wt 302 lbs, pulse ox 98%  Prior CV studies:   The following studies were reviewed today:  Myovue 10/01/2017 Study Highlights    Nuclear stress EF: 45%.  There was no ST segment deviation noted during stress.  The left ventricular ejection fraction is mildly decreased (45-54%).  This is an intermediate risk study due to reduced systolic function. No ischemia noted.  Abnormal resting perfusion images noted in the septum, consistent with LBBB.     Echo 09/30/2017 Study Conclusions - Left ventricle: The cavity size was normal. Wall thickness was   increased in a pattern of moderate LVH. Systolic function was   normal. The estimated ejection fraction was in the range of 60%   to 65%. Wall motion was normal; there were no regional wall   motion abnormalities. Doppler parameters are consistent with   abnormal left ventricular relaxation (grade 1 diastolic  dysfunction). The E/e&' ratio is between 8-15, suggesting   indeterminate LV fililng pressure. - Mitral valve: Mildly thickened leaflets . There was trivial   regurgitation. - Left atrium: The atrium was normal in size. - Right atrium: The atrium was normal in size. - Tricuspid valve: There was trivial regurgitation. - Pulmonary arteries: PA peak pressure: 27 mm Hg (S). - Inferior vena cava: The vessel was normal in size. The   respirophasic diameter changes were in the normal range (= 50%),   consistent with normal central venous pressure.  Impressions: - Compared to a prior  study in 2015, the LVEF is slightly lower but   normal at 60-65% with moderate LVH, grade 1 DD and indetermiante   LV fililng pressure.     Past Medical History:  Diagnosis Date   Abnormal electrocardiogram 08/14/2014   LBBB   Atrial fibrillation ([place])    Diabetes mellitus ([place])    [place] DEGENERATION 12/20/2009   Qualifier: Diagnosis of  By: Aline Brochure MD, [place]     Dyspnea 08/14/2014   Hyperlipidemia    Hypertension    LOW BACK PAIN 12/20/2009   Qualifier: Diagnosis of  By: Aline Brochure MD, Stanley     Morbid obesity ([place])    OSA (obstructive sleep apnea) 08/16/2014   Seasonal allergies    SPINAL STENOSIS 12/20/2009   Qualifier: Diagnosis of  By: Aline Brochure MD, Dorothyann Peng     Past Surgical History:  Procedure Laterality Date   CHOLECYSTECTOMY     COLONOSCOPY WITH PROPOFOL N/A 11/29/2014   Procedure: COLONOSCOPY WITH PROPOFOL;  Surgeon: Arta Silence, MD;  Location: [facility];  Service: Endoscopy;  Laterality: N/A;   TONSILLECTOMY       Current Meds  Medication Sig   Accu-Chek FastClix Lancets MISC Use as instructed to test three times daily. DX: E11.65   atenolol (TENORMIN) 50 MG tablet TAKE 1 TABLET BY MOUTH TWICE A DAY   Blood Glucose Monitoring Suppl (ONETOUCH VERIO) w/Device KIT UAD to monitor glucose tid; Dx: E11.65   canagliflozin (INVOKANA) 300 MG TABS tablet TAKE 1 TABLET (300 MG TOTAL) BY MOUTH DAILY BEFORE BREAKFAST.   DULoxetine (CYMBALTA) 60 MG capsule Take 60 mg by mouth daily. TAKE 1 TABLET BY MOUTH ONCE DAILY.   gabapentin (NEURONTIN) 600 MG tablet Take 1 tablet by mouth four times daily.   glucose blood (ONE TOUCH ULTRA TEST) test strip Use as instructed to check blood sugar three times daily.   HYDROcodone-acetaminophen (NORCO/VICODIN) 5-325 MG tablet every 6 (six) hours as needed.    Insulin Glargine, 2 Unit Dial, (TOUJEO MAX SOLOSTAR) 300 UNIT/ML SOPN Inject 100 Units into the skin daily. Inject 100 units under the skin once daily.    Insulin Pen Needle (B-D ULTRAFINE III SHORT PEN) 31G X 8 MM MISC Use as instructed to inject insulin 5 times daily.   latanoprost (XALATAN) 0.005 % ophthalmic solution Place 1 drop into both eyes at bedtime.    liraglutide (VICTOZA) 18 MG/3ML SOPN Inject 1.'2mg'$  under the skin once daily at the same time (Patient taking differently: 1.8 mg. Inject 1.'8mg'$  under the skin once daily at the same time)   metFORMIN (GLUCOPHAGE-XR) 500 MG 24 hr tablet Take 4 tablets (2,000 mg total) by mouth daily with supper.   NOVOLOG FLEXPEN 100 UNIT/ML FlexPen INJECT 10-15 UNITS 3 TIMES A DAY WITH MEALS   Omega-3 Fatty Acids (FISH OIL) 1000 MG CAPS Take 2 capsules by mouth daily.   pravastatin (PRAVACHOL) 40 MG tablet Take 40 mg by mouth  every other day.   rivaroxaban (XARELTO) 20 MG TABS tablet Take 1 tablet (20 mg total) by mouth daily with supper.   tiZANidine (ZANAFLEX) 2 MG tablet Take 2 mg by mouth every 6 (six) hours as needed for muscle spasms.   [DISCONTINUED] fenofibrate (TRICOR) 145 MG tablet Take 1 tablet (145 mg total) by mouth daily.   [DISCONTINUED] pravastatin (PRAVACHOL) 40 MG tablet Take 1 tablet (40 mg total) by mouth daily.     Allergies:   Sulfa antibiotics   Social History   Tobacco Use   Smoking status: Former Smoker    Packs/day: 2.00    Years: 20.00    Pack years: 40.00    Types: Cigarettes    Last attempt to quit: 11/22/1993    Years since quitting: 25.1   Smokeless tobacco: Former Systems developer  Substance Use Topics   Alcohol use: Yes    Alcohol/week: 0.0 standard drinks    Comment: Occasional   Drug use: No     Family Hx: The patient's family history includes Cancer - Lung in her father; Heart disease in her father.  ROS:   Please see the history of present illness.     All other systems reviewed and are negative.   Labs/Other Tests and Data Reviewed:    Recent Labs: 01/11/2019: ALT 23; BUN 32; Creatinine, Ser 1.14; Potassium 3.9; Sodium 136   Recent Lipid  Panel Lab Results  Component Value Date/Time   CHOL 158 01/11/2019 11:06 AM   TRIG (H) 01/11/2019 11:06 AM    462.0 Triglyceride is over 400; calculations on Lipids are invalid.   HDL 37.10 (L) 01/11/2019 11:06 AM   CHOLHDL 4 01/11/2019 11:06 AM   LDLCALC 104 (H) 09/17/2015 11:02 AM   LDLDIRECT 83.0 01/11/2019 11:06 AM    Wt Readings from Last 3 Encounters:  01/14/19 (!) 302 lb (137 kg)  01/13/19 (!) 302 lb 3.2 oz (137.1 kg)  11/09/18 (!) 300 lb 3.2 oz (136.2 kg)     Objective:    Vital Signs:  BP 106/60    Ht '5\' 2"'$  (1.575 m)    Wt (!) 302 lb (137 kg)    BMI 55.24 kg/m   VS at IM office visit on 01/13/2019: BP 110/60, P 90, Wt 302 lbs, pulse ox 98%  Pt in no acute distress. On the phone breathing sounds normal. Pt able to speak in complete sentences. No cough or wheezes audible.   ASSESSMENT & PLAN:    Atrial fibrillation -In setting of virtual visit, cannot tell if pt in afib or sinus rhythm. Continues on beta blocker. By symptoms, sounds like pt is having brief bursts of afib that cause chest tightness and occ feeling of rapid heart beat.  -Pt states compliant with atenolol. I advised her that she can take an extra atenolol if HR is up for longer than a few minutes. She will monitor on her apple watch.  -On Xarelto 20 mg daily for stroke risk reduction with CHA2DS2/VAS Stroke Risk Score of 4 (HTN, DM, age 57, female). No unusual bleeding. (CrCl 106.39) -CBC followed by PCP, not on Epic. Pt will have results sent to our office.  -Pt has OSA and has not been using CPAP. Advised to try to be fitted for a nasal mask and get back on CPAP to help prevent afib.   Hypertension -On atenolol 50 mg daily. BP well controlled.  -Work on diet and exercise.   Hyperlipidemia -On pravastatin 40 mg daily, fenofibrate and omega  3 fatty acids.  -LDL 83 01/11/2019, adequately controlled. Triglycerides 462, likely related to poorly controlled DM.  -Lipids checked by endocrinology yesterday.  Results pending.   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 Poorly controlled. With neuropathy.  -Pt was just seen by IM yesterday with adjustment in insulin.  -Pt is also on a SGLT-2 inhibitor which can confer some risk modification for CV disease.  -Discussed importance of better blood sugar control to decrease her cardiovascular risk. Advised on diet and exercise.  Obesity -Ht 5'2", wt 302 lbs. BMI 55.27 -Advise wt loss via calorie reduction and increase in physical activity. We talked about using resistance bands to work upper body muscles as her lower body joint pain limits her activity.   OSA -Pt not using CPAP due to drooling in mask. We discussed importance of treatment sleep apnea especially to treat/prevent afib.  -Pt advised to contact her supply company to try a new mask.   COVID-19 Education: The signs and symptoms of COVID-19 were discussed with the patient and how to seek care for testing (follow up with PCP or arrange E-visit).  The importance of social distancing was discussed today.  Patient Risk:   After full review of this patient's clinical status, I feel that they are at least moderate risk at this time.  Time:   Today, I have spent 32 minutes with the patient with telehealth technology discussing atrial fibrillation, medications, risk factor modification.     Medication Adjustments/Labs and Tests Ordered: Current medicines are reviewed at length with the patient today.  Concerns regarding medicines are outlined above.  Tests Ordered: No orders of the defined types were placed in this encounter.  Medication Changes: No orders of the defined types were placed in this encounter.   Disposition:  Follow up in 9 month(s)  Signed, Daune Perch, NP  01/14/2019 1:47 PM    [facility]

## 2019-01-13 NOTE — Patient Instructions (Addendum)
Medication Instructions:  Can take an extra dose of atenolol if needed for fast heart beat that lasts longer than 15 minutes.   If you need a refill on your cardiac medications before your next appointment, please call your pharmacy.   Lab work: None  If you have labs (blood work) drawn today and your tests are completely normal, you will receive your results only by: Marland Kitchen MyChart Message (if you have MyChart) OR . A paper copy in the mail If you have any lab test that is abnormal or we need to change your treatment, we will call you to review the results.  Testing/Procedures: None  Follow-Up: At [facility], you and your health needs are our priority.  As part of our continuing mission to provide you with exceptional heart care, we have created designated Provider Care Teams.  These Care Teams include your primary Cardiologist (physician) and Advanced Practice Providers (APPs -  Physician Assistants and Nurse Practitioners) who all work together to provide you with the care you need, when you need it. You will need a follow up appointment in:  9 months.  Please call our office 2 months in advance to schedule this appointment.  You may see Dietrich Pates, MD or one of the following Advanced Practice Providers on your designated Care Team: Tereso Newcomer, PA-C [place], [place] . Berton Bon, NP  Any Other Special Instructions Will Be Listed Below (If Applicable).  -Work on getting back to using CPAP.   -Work on weight loss by reducing calorie intake and increasing physical activity -Recommend heart healthy/Mediterranean diet, with whole grains, fruits, vegetable, fish, lean meats, nuts, and olive oil. Limit salt. -Recommend moderate walking, 3-5 times/week for 30-50 minutes each session. Aim for at least 150 minutes.week. Goal should be pace of 3 miles/hours, or walking 1.5 miles in 30 minutes -Use resistance bands for muscle toning.  -Recommend avoidance of tobacco products. Avoid excess  alcohol.    Obesity, Adult Obesity is having too much body fat. If you have a BMI of 30 or more, you are obese. BMI is a number that explains how much body fat you have. Obesity is often caused by taking in (consuming) more calories than your body uses. Obesity can cause serious health problems. Changing your lifestyle can help to treat obesity. Follow these instructions at home: Eating and drinking   Follow advice from your doctor about what to eat and drink. Your doctor may tell you to: ? Cut down on (limit) fast foods, sweets, and processed snack foods. ? Choose low-fat options. For example, choose low-fat milk instead of whole milk. ? Eat 5 or more servings of fruits or vegetables every day. ? Eat at home more often. This gives you more control over what you eat. ? Choose healthy foods when you eat out. ? Learn what a healthy portion size is. A portion size is the amount of a certain food that is healthy for you to eat at one time. This is different for each person. ? Keep low-fat snacks available. ? Avoid sugary drinks. These include soda, fruit juice, iced tea that is sweetened with sugar, and flavored milk. ? Eat a healthy breakfast.  Drink enough water to keep your pee (urine) clear or pale yellow.  Do not go without eating for long periods of time (do not fast).  Do not go on popular or trendy diets (fad diets). Physical Activity  Exercise often, as told by your doctor. Ask your doctor: ? What types  of exercise are safe for you. ? How often you should exercise.  Warm up and stretch before being active.  Do slow stretching after being active (cool down).  Rest between times of being active. Lifestyle  Limit how much time you spend in front of your TV, computer, or video game system (be less sedentary).  Find ways to reward yourself that do not involve food.  Limit alcohol intake to no more than 1 drink a day for nonpregnant women and 2 drinks a day for men. One  drink equals 12 oz of beer, 5 oz of wine, or 1 oz of hard liquor. General instructions  Keep a weight loss journal. This can help you keep track of: ? The food that you eat. ? The exercise that you do.  Take over-the-counter and prescription medicines only as told by your doctor.  Take vitamins and supplements only as told by your doctor.  Think about joining a support group. Your doctor may be able to help with this.  Keep all follow-up visits as told by your doctor. This is important. Contact a doctor if:  You cannot meet your weight loss goal after you have changed your diet and lifestyle for 6 weeks. This information is not intended to replace advice given to you by your health care provider. Make sure you discuss any questions you have with your health care provider. Document Released: 12/22/2011 Document Revised: 03/06/2016 Document Reviewed: 07/18/2015 Elsevier Interactive Patient Education  2019 Elsevier Inc.       Atrial Fibrillation  Atrial fibrillation is a type of heartbeat that is irregular or fast (rapid). If you have this condition, your heart beats without any order. This makes it hard for your heart to pump blood in a normal way. Having this condition gives you more risk for stroke, heart failure, and other heart problems. Atrial fibrillation may start all of a sudden and then stop on its own, or it may become a long-lasting problem. What are the causes? This condition may be caused by heart conditions, such as:  High blood pressure.  Heart failure.  Heart valve disease.  Heart surgery. Other causes include:  Pneumonia.  Obstructive sleep apnea.  Lung cancer.  Thyroid disease.  Drinking too much alcohol. Sometimes the cause is not known. What increases the risk? You are more likely to develop this condition if:  You smoke.  You are older.  You have diabetes.  You are overweight.  You have a family history of this condition.  You  exercise often and hard. What are the signs or symptoms? Common symptoms of this condition include:  A feeling like your heart is beating very fast.  Chest pain.  Feeling short of breath.  Feeling light-headed or weak.  Getting tired easily. Follow these instructions at home: Medicines  Take over-the-counter and prescription medicines only as told by your doctor.  If your doctor gives you a blood-thinning medicine, take it exactly as told. Taking too much of it can cause bleeding. Taking too little of it does not protect you against clots. Clots can cause a stroke. Lifestyle      Do not use any tobacco products. These include cigarettes, chewing tobacco, and e-cigarettes. If you need help quitting, ask your doctor.  Do not drink alcohol.  Do not drink beverages that have caffeine. These include coffee, soda, and tea.  Follow diet instructions as told by your doctor.  Exercise regularly as told by your doctor. General instructions  If you  have a condition that causes breathing to stop for a short period of time (apnea), treat it as told by your doctor.  Keep a healthy weight. Do not use diet pills unless your doctor says they are safe for you. Diet pills may make heart problems worse.  Keep all follow-up visits as told by your doctor. This is important. Contact a doctor if:  You notice a change in the speed, rhythm, or strength of your heartbeat.  You are taking a blood-thinning medicine and you see more bruising.  You get tired more easily when you move or exercise.  You have a sudden change in weight. Get help right away if:   You have pain in your chest or your belly (abdomen).  You have trouble breathing.  You have blood in your vomit, poop, or pee (urine).  You have any signs of a stroke. "BE FAST" is an easy way to remember the main warning signs: ? B - Balance. Signs are dizziness, sudden trouble walking, or loss of balance. ? E - Eyes. Signs are  trouble seeing or a change in how you see. ? F - Face. Signs are sudden weakness or loss of feeling in the face, or the face or eyelid drooping on one side. ? A - Arms. Signs are weakness or loss of feeling in an arm. This happens suddenly and usually on one side of the body. ? S - Speech. Signs are sudden trouble speaking, slurred speech, or trouble understanding what people say. ? T - Time. Time to call emergency services. Write down what time symptoms started.  You have other signs of a stroke, such as: ? A sudden, very bad headache with no known cause. ? Feeling sick to your stomach (nausea). ? Throwing up (vomiting). ? Jerky movements you cannot control (seizure). These symptoms may be an emergency. Do not wait to see if the symptoms will go away. Get medical help right away. Call your local emergency services (911 in the [place] Do not drive yourself to the hospital. Summary  Atrial fibrillation is a type of heartbeat that is irregular or fast (rapid).  You are at higher risk of this condition if you smoke, are older, have diabetes, or are overweight.  Follow your doctor's instructions about medicines, diet, exercise, and follow-up visits.  Get help right away if you think that you have signs of a stroke. This information is not intended to replace advice given to you by your health care provider. Make sure you discuss any questions you have with your health care provider. Document Released: 07/08/2008 Document Revised: 11/20/2017 Document Reviewed: 11/20/2017 Elsevier Interactive Patient Education  2019 [employer].

## 2019-01-14 ENCOUNTER — Other Ambulatory Visit: Payer: Self-pay

## 2019-01-14 ENCOUNTER — Ambulatory Visit: Payer: PPO | Admitting: Cardiology

## 2019-01-14 ENCOUNTER — Telehealth (INDEPENDENT_AMBULATORY_CARE_PROVIDER_SITE_OTHER): Payer: PPO | Admitting: Cardiology

## 2019-01-14 ENCOUNTER — Encounter: Payer: Self-pay | Admitting: Cardiology

## 2019-01-14 VITALS — BP 106/60 | Ht 62.0 in | Wt 302.0 lb

## 2019-01-14 DIAGNOSIS — E782 Mixed hyperlipidemia: Secondary | ICD-10-CM | POA: Diagnosis not present

## 2019-01-14 DIAGNOSIS — I10 Essential (primary) hypertension: Secondary | ICD-10-CM

## 2019-01-14 DIAGNOSIS — E1140 Type 2 diabetes mellitus with diabetic neuropathy, unspecified: Secondary | ICD-10-CM | POA: Diagnosis not present

## 2019-01-14 DIAGNOSIS — I480 Paroxysmal atrial fibrillation: Secondary | ICD-10-CM | POA: Insufficient documentation

## 2019-01-14 DIAGNOSIS — G4733 Obstructive sleep apnea (adult) (pediatric): Secondary | ICD-10-CM

## 2019-01-14 DIAGNOSIS — Z6841 Body Mass Index (BMI) 40.0 and over, adult: Secondary | ICD-10-CM

## 2019-01-14 DIAGNOSIS — E119 Type 2 diabetes mellitus without complications: Secondary | ICD-10-CM | POA: Insufficient documentation

## 2019-01-14 DIAGNOSIS — Z794 Long term (current) use of insulin: Secondary | ICD-10-CM | POA: Diagnosis not present

## 2019-01-14 DIAGNOSIS — E1169 Type 2 diabetes mellitus with other specified complication: Secondary | ICD-10-CM | POA: Insufficient documentation

## 2019-01-14 DIAGNOSIS — E785 Hyperlipidemia, unspecified: Secondary | ICD-10-CM | POA: Insufficient documentation

## 2019-01-14 MED ORDER — RIVAROXABAN 20 MG PO TABS: 20.0000 mg | ORAL_TABLET | Freq: Every day | ORAL | 11 refills | Status: DC

## 2019-01-14 NOTE — Telephone Encounter (Signed)
Pt last saw Berton Bon, NP on 01/14/19, last labs 01/11/19 Creat 1.14, age 67, weight 137kg, CrCl 106.41, based on CrCl pt is on appropriate dosage of Xarelto 20mg  QD.  Will refill rx.

## 2019-01-17 DIAGNOSIS — E119 Type 2 diabetes mellitus without complications: Secondary | ICD-10-CM | POA: Diagnosis not present

## 2019-01-17 DIAGNOSIS — N179 Acute kidney failure, unspecified: Secondary | ICD-10-CM | POA: Diagnosis not present

## 2019-01-17 DIAGNOSIS — E1165 Type 2 diabetes mellitus with hyperglycemia: Secondary | ICD-10-CM | POA: Diagnosis not present

## 2019-01-17 DIAGNOSIS — E1142 Type 2 diabetes mellitus with diabetic polyneuropathy: Secondary | ICD-10-CM | POA: Diagnosis not present

## 2019-01-17 DIAGNOSIS — F321 Major depressive disorder, single episode, moderate: Secondary | ICD-10-CM | POA: Diagnosis not present

## 2019-01-17 DIAGNOSIS — M160 Bilateral primary osteoarthritis of hip: Secondary | ICD-10-CM | POA: Diagnosis not present

## 2019-01-17 DIAGNOSIS — I4891 Unspecified atrial fibrillation: Secondary | ICD-10-CM | POA: Diagnosis not present

## 2019-01-17 DIAGNOSIS — I10 Essential (primary) hypertension: Secondary | ICD-10-CM | POA: Diagnosis not present

## 2019-01-17 DIAGNOSIS — M170 Bilateral primary osteoarthritis of knee: Secondary | ICD-10-CM | POA: Diagnosis not present

## 2019-01-17 DIAGNOSIS — N183 Chronic kidney disease, stage 3 (moderate): Secondary | ICD-10-CM | POA: Diagnosis not present

## 2019-01-17 DIAGNOSIS — E785 Hyperlipidemia, unspecified: Secondary | ICD-10-CM | POA: Diagnosis not present

## 2019-02-01 ENCOUNTER — Ambulatory Visit: Payer: PPO | Admitting: Dietician

## 2019-02-16 ENCOUNTER — Other Ambulatory Visit: Payer: Self-pay | Admitting: Endocrinology

## 2019-02-18 ENCOUNTER — Telehealth: Payer: Self-pay | Admitting: Endocrinology

## 2019-02-18 NOTE — Telephone Encounter (Signed)
Pt needs the toujeo to be a 3 month supply it is cheaper with her insurance [facility] [place] please she does have enough to only last her one more day

## 2019-02-21 ENCOUNTER — Other Ambulatory Visit: Payer: Self-pay

## 2019-02-21 ENCOUNTER — Telehealth: Payer: Self-pay | Admitting: Endocrinology

## 2019-02-21 MED ORDER — INSULIN GLARGINE (2 UNIT DIAL) 300 UNIT/ML ~~LOC~~ SOPN: 100.0000 [IU] | PEN_INJECTOR | Freq: Every day | SUBCUTANEOUS | 1 refills | Status: DC

## 2019-02-21 NOTE — Telephone Encounter (Signed)
Rx sent 

## 2019-02-21 NOTE — Telephone Encounter (Signed)
Patient requests 3 Month of Refills for ;  MEDICATION: Insulin Glargine, 2 Unit Dial, (TOUJEO MAX SOLOSTAR) 300 UNIT/ML SOPN  PHARMACY:   [id number] - [place], [place] - [address] [phone] (Phone) [phone] (Fax)    IS THIS A 90 DAY SUPPLY : Yes  IS PATIENT OUT OF MEDICATION: No  IF NOT; HOW MUCH IS LEFT: 1 Pen  LAST APPOINTMENT DATE: @5 /08/2019  NEXT APPOINTMENT DATE:@5 /26/2020  DO WE HAVE YOUR PERMISSION TO LEAVE A DETAILED MESSAGE: Yes  OTHER COMMENTS:    **Let patient know to contact pharmacy at the end of the day to make sure medication is ready. **  ** Please notify patient to allow 48-72 hours to process**  **Encourage patient to contact the pharmacy for refills or they can request refills through [facility]**

## 2019-02-23 ENCOUNTER — Other Ambulatory Visit: Payer: Self-pay

## 2019-02-23 MED ORDER — INSULIN GLARGINE (2 UNIT DIAL) 300 UNIT/ML ~~LOC~~ SOPN: 100.0000 [IU] | PEN_INJECTOR | Freq: Every day | SUBCUTANEOUS | 1 refills | Status: DC

## 2019-03-08 ENCOUNTER — Other Ambulatory Visit: Payer: Self-pay

## 2019-03-08 ENCOUNTER — Other Ambulatory Visit (INDEPENDENT_AMBULATORY_CARE_PROVIDER_SITE_OTHER): Payer: PPO

## 2019-03-08 DIAGNOSIS — Z794 Long term (current) use of insulin: Secondary | ICD-10-CM | POA: Diagnosis not present

## 2019-03-08 DIAGNOSIS — E1165 Type 2 diabetes mellitus with hyperglycemia: Secondary | ICD-10-CM

## 2019-03-08 DIAGNOSIS — E782 Mixed hyperlipidemia: Secondary | ICD-10-CM | POA: Diagnosis not present

## 2019-03-08 LAB — COMPREHENSIVE METABOLIC PANEL
ALT: 23 U/L (ref 0–35)
AST: 26 U/L (ref 0–37)
Albumin: 3.8 g/dL (ref 3.5–5.2)
Alkaline Phosphatase: 47 U/L (ref 39–117)
BUN: 29 mg/dL — ABNORMAL HIGH (ref 6–23)
CO2: 24 mEq/L (ref 19–32)
Calcium: 9.9 mg/dL (ref 8.4–10.5)
Chloride: 104 mEq/L (ref 96–112)
Creatinine, Ser: 1.09 mg/dL (ref 0.40–1.20)
GFR: 50.25 mL/min — ABNORMAL LOW (ref >60.00)
Glucose, Bld: 140 mg/dL — ABNORMAL HIGH (ref 70–99)
Potassium: 3.9 mEq/L (ref 3.5–5.1)
Sodium: 139 mEq/L (ref 135–145)
Total Bilirubin: 0.4 mg/dL (ref 0.2–1.2)
Total Protein: 7.4 g/dL (ref 6.0–8.3)

## 2019-03-08 LAB — HEMOGLOBIN A1C: Hgb A1c MFr Bld: 9.6 % — ABNORMAL HIGH (ref 4.6–6.5)

## 2019-03-08 LAB — LIPID PANEL
Cholesterol: 138 mg/dL (ref 0–200)
HDL: 33.2 mg/dL — ABNORMAL LOW (ref >39.00)
LDL Cholesterol: 66 mg/dL (ref 0–99)
NonHDL: 104.35
Total CHOL/HDL Ratio: 4
Triglycerides: 194 mg/dL — ABNORMAL HIGH (ref 0.0–149.0)
VLDL: 38.8 mg/dL (ref 0.0–40.0)

## 2019-03-10 ENCOUNTER — Ambulatory Visit: Payer: PPO | Admitting: Endocrinology

## 2019-03-10 ENCOUNTER — Ambulatory Visit: Payer: PPO | Admitting: Dietician

## 2019-03-15 ENCOUNTER — Other Ambulatory Visit: Payer: Self-pay

## 2019-03-15 ENCOUNTER — Ambulatory Visit: Payer: PPO | Admitting: Endocrinology

## 2019-03-24 ENCOUNTER — Other Ambulatory Visit: Payer: Self-pay

## 2019-03-24 ENCOUNTER — Ambulatory Visit (INDEPENDENT_AMBULATORY_CARE_PROVIDER_SITE_OTHER): Payer: PPO | Admitting: Endocrinology

## 2019-03-24 ENCOUNTER — Encounter: Payer: Self-pay | Admitting: Endocrinology

## 2019-03-24 DIAGNOSIS — E1142 Type 2 diabetes mellitus with diabetic polyneuropathy: Secondary | ICD-10-CM

## 2019-03-24 DIAGNOSIS — E782 Mixed hyperlipidemia: Secondary | ICD-10-CM | POA: Diagnosis not present

## 2019-03-24 DIAGNOSIS — E1165 Type 2 diabetes mellitus with hyperglycemia: Secondary | ICD-10-CM

## 2019-03-24 DIAGNOSIS — Z794 Long term (current) use of insulin: Secondary | ICD-10-CM

## 2019-03-24 NOTE — Progress Notes (Signed)
Patient ID: Melanie Reeves, female   DOB: November 30, 1950, 68 y.o.   MRN: [id number]           Reason for Appointment: Follow-up  for Type 2 Diabetes  Referring physician: Yaakov Guthrie  Today's office visit was provided via telemedicine using video technique The patient was explained the limitations of evaluation and management by telemedicine and the availability of in person appointments.  The patient understood the limitations and agreed to proceed. Patient also understood that the telehealth visit is billable. . Location of the patient: Patient's home . Location of the provider: Physician office Only the patient and myself were participating in the encounter    History of Present Illness:          Date of diagnosis of type 2 diabetes mellitus:  2011       Background history:     She was tested for diabetes when she presented with numbness and tingling in her feet to the [facility]. She does not know what her blood sugars were initially  She was however started on both metformin and glipizide at that time. She thinks that in early 2015 she was started on Lantus insulin also developed poor control  Her record indicates A1c levels ranging from 8.2-9.3 and higher at 11.4 in 9/16 She had been started on Victoza in 12/16  Recent history:   INSULIN regimen is: Toujeo 120 U in am  Non-insulin hypoglycemic drugs the patient is taking are: Invokana 300 mg,  Metformin ER 2000 mg daily, Victoza 1.81m  A1c is still high at 9.6 compared to 10.3  Current blood sugar patterns and problems identified:  She is again forgetting to take her NovoLog at mealtimes and has not taken it at all  Also not checking readings after meals  She thinks that some of her fasting readings are higher from eating snacks during the night although trying to cut back on carbohydrate snacks  Fasting readings are somewhat better overall compared to her last visit by increasing Toujeo by 20 units  She  did not check her weight at home  Also because of hip pain she does not do any walking for exercise  Not clear if she is benefiting [place] she is not able to control her diet as well  Still has likely postprandial hyperglycemia judging from her A1c  She is fairly consistent with taking her Toujeo daily  No side effects from Invokana and renal function is stable   Side effects from medications have been: none  Compliance with the medical regimen: inconsistent Hypoglycemia:  never  Glucose monitoring:        Glucometer:  One Touch       Blood Glucose readings:  FASTING range recently 162-229, no readings after meals.  No average blood sugar readings available  Previous readings  PRE-MEAL Fasting Lunch Dinner Bedtime Overall  Glucose range:  192-302    195-313   Mean/median:  230    222  228     Self-care: The diet that the patient has been following is: None, not restricting carbohydrates and fats;   eating more starchy foods and bread at times    Meal times: Breakfast:10-11 AM Lunch:4 PM Dinner: 7 pm    Typical meal intake: Breakfast is eggs/ low fat meat..  Lunch.  Evening meal is meat, vegetables,                 Dietician visit, most recent: Years ago  Exercise:  none  Weight history: Lowest 178 lb several years ago, more recently 300-330  Wt Readings from Last 3 Encounters:  01/14/19 (!) 302 lb (137 kg)  01/13/19 (!) 302 lb 3.2 oz (137.1 kg)  11/09/18 (!) 300 lb 3.2 oz (136.2 kg)   Glycemic control:   Lab Results  Component Value Date   HGBA1C 9.6 (H) 03/08/2019   HGBA1C 10.3 (H) 10/29/2018   HGBA1C 9.9 (H) 06/25/2018   Lab Results  Component Value Date   MICROALBUR 9.1 (H) 10/29/2018   LDLCALC 66 03/08/2019   CREATININE 1.09 03/08/2019      No visits with results within 1 Week(s) from this visit.  Latest known visit with results is:  Lab on 03/08/2019  Component Date Value Ref Range Status  . Hgb A1c MFr Bld 03/08/2019  9.6* 4.6 - 6.5 % Final   Glycemic Control Guidelines for People with Diabetes:Non Diabetic:  <6%Goal of Therapy: <7%Additional Action Suggested:  >8%   . Sodium 03/08/2019 139  135 - 145 mEq/L Final  . Potassium 03/08/2019 3.9  3.5 - 5.1 mEq/L Final  . Chloride 03/08/2019 104  96 - 112 mEq/L Final  . CO2 03/08/2019 24  19 - 32 mEq/L Final  . Glucose, Bld 03/08/2019 140* 70 - 99 mg/dL Final  . BUN 03/08/2019 29* 6 - 23 mg/dL Final  . Creatinine, Ser 03/08/2019 1.09  0.40 - 1.20 mg/dL Final  . Total Bilirubin 03/08/2019 0.4  0.2 - 1.2 mg/dL Final  . Alkaline Phosphatase 03/08/2019 47  39 - 117 U/L Final  . AST 03/08/2019 26  0 - 37 U/L Final  . ALT 03/08/2019 23  0 - 35 U/L Final  . Total Protein 03/08/2019 7.4  6.0 - 8.3 g/dL Final  . Albumin 03/08/2019 3.8  3.5 - 5.2 g/dL Final  . Calcium 03/08/2019 9.9  8.4 - 10.5 mg/dL Final  . GFR 03/08/2019 50.25* >60.00 mL/min Final  . Cholesterol 03/08/2019 138  0 - 200 mg/dL Final   ATP III Classification       Desirable:  < 200 mg/dL               Borderline High:  200 - 239 mg/dL          High:  > = 240 mg/dL  . Triglycerides 03/08/2019 194.0* 0.0 - 149.0 mg/dL Final   Normal:  <150 mg/dLBorderline High:  150 - 199 mg/dL  . HDL 03/08/2019 33.20* >39.00 mg/dL Final  . VLDL 03/08/2019 38.8  0.0 - 40.0 mg/dL Final  . LDL Cholesterol 03/08/2019 66  0 - 99 mg/dL Final  . Total CHOL/HDL Ratio 03/08/2019 4   Final                  Men          Women1/2 Average Risk     3.4          3.3Average Risk          5.0          4.42X Average Risk          9.6          7.13X Average Risk          15.0          11.0                      . NonHDL 03/08/2019 104.35   Final   NOTE:  Non-HDL goal should be 30 mg/dL higher than patient's LDL goal (i.e. LDL goal of < 70 mg/dL, would have non-HDL goal of < 100 mg/dL)      Allergies as of 03/24/2019      Reactions   Sulfa Antibiotics Other (See Comments)   Cant remember      Medication List       Accurate as  of March 24, 2019  9:26 AM. If you have any questions, ask your nurse or doctor.        STOP taking these medications   NovoLOG FlexPen 100 UNIT/ML FlexPen Generic drug: insulin aspart Stopped by: Ajay Kumar, MD     TAKE these medications   Accu-Chek FastClix Lancets Misc Use as instructed to test three times daily. DX: E11.65   atenolol 50 MG tablet Commonly known as: TENORMIN TAKE 1 TABLET BY MOUTH TWICE A DAY   canagliflozin 300 MG Tabs tablet Commonly known as: Invokana TAKE 1 TABLET (300 MG TOTAL) BY MOUTH DAILY BEFORE BREAKFAST.   DULoxetine 60 MG capsule Commonly known as: CYMBALTA Take 60 mg by mouth daily. TAKE 1 TABLET BY MOUTH ONCE DAILY.   fenofibrate 145 MG tablet Commonly known as: TRICOR Take 145 mg by mouth daily. Pt will start tomorrow 01/15/2019   Fish Oil 1000 MG Caps Take 2 capsules by mouth daily.   gabapentin 600 MG tablet Commonly known as: NEURONTIN Take 1 tablet by mouth four times daily.   glucose blood test strip Commonly known as: ONE TOUCH ULTRA TEST Use as instructed to check blood sugar three times daily.   HYDROcodone-acetaminophen 5-325 MG tablet Commonly known as: NORCO/VICODIN every 6 (six) hours as needed.   Insulin Glargine (2 Unit Dial) 300 UNIT/ML Sopn Commonly known as: Toujeo Max SoloStar Inject 100 Units into the skin daily. Inject 100 units under the skin once daily. What changed:   how much to take  additional instructions   Insulin Pen Needle 31G X 8 MM Misc Commonly known as: B-D ULTRAFINE III SHORT PEN Use as instructed to inject insulin 5 times daily.   latanoprost 0.005 % ophthalmic solution Commonly known as: XALATAN Place 1 drop into both eyes at bedtime.   liraglutide 18 MG/3ML Sopn Commonly known as: Victoza Inject 1.2mg under the skin once daily at the same time What changed:   how much to take  additional instructions   loratadine 10 MG tablet Commonly known as: CLARITIN Take 10 mg by mouth  daily.   metFORMIN 500 MG 24 hr tablet Commonly known as: GLUCOPHAGE-XR Take 4 tablets (2,000 mg total) by mouth daily with supper.   OneTouch Verio w/Device Kit UAD to monitor glucose tid; Dx: E11.65   pravastatin 40 MG tablet Commonly known as: PRAVACHOL Take 40 mg by mouth every other day.   rivaroxaban 20 MG Tabs tablet Commonly known as: Xarelto Take 1 tablet (20 mg total) by mouth daily with supper.   tiZANidine 2 MG tablet Commonly known as: ZANAFLEX Take 2 mg by mouth every 6 (six) hours as needed for muscle spasms.       Allergies:  Allergies  Allergen Reactions  . Sulfa Antibiotics Other (See Comments)    Cant remember    Past Medical History:  Diagnosis Date  . Abnormal electrocardiogram 08/14/2014   LBBB  . Atrial fibrillation (HCC)   . Diabetes mellitus (HCC)   . 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 Dyspnea 08/14/2014  .   Hyperlipidemia   . Hypertension   . LOW BACK PAIN 12/20/2009   Qualifier: Diagnosis of  By: Harrison MD, Stanley    . Morbid obesity (HCC)   . OSA (obstructive sleep apnea) 08/16/2014  . Seasonal allergies   . SPINAL STENOSIS 12/20/2009   Qualifier: Diagnosis of  By: Harrison MD, Stanley      Past Surgical History:  Procedure Laterality Date  . CHOLECYSTECTOMY    . COLONOSCOPY WITH PROPOFOL N/A 11/29/2014   Procedure: COLONOSCOPY WITH PROPOFOL;  Surgeon: William Outlaw, MD;  Location: [facility];  Service: Endoscopy;  Laterality: N/A;  . TONSILLECTOMY      Family History  Problem Relation Age of Onset  . Heart disease Father        MI  . Cancer - Lung Father        Small cell Lung CA    Social History:  reports that she quit smoking about 25 years ago. Her smoking use included cigarettes. She has a 40.00 pack-year smoking history. She has quit using smokeless tobacco. She reports current alcohol use. She reports that she does not use drugs.    Review of Systems    Lipid  history:    Triglycerides have been high and she is now taking fenofibrate in addition to her pravastatin She is taking pravastatin 4 days a week since she had intolerance to simvastatin before LDL is improved and triglycerides are below 200 now    Lab Results  Component Value Date   CHOL 138 03/08/2019   CHOL 158 01/11/2019   CHOL 178 10/29/2018   Lab Results  Component Value Date   HDL 33.20 (L) 03/08/2019   HDL 37.10 (L) 01/11/2019   HDL 39.70 10/29/2018   Lab Results  Component Value Date   LDLCALC 66 03/08/2019   LDLCALC 104 (H) 09/17/2015   LDLCALC 115 07/02/2015   Lab Results  Component Value Date   TRIG 194.0 (H) 03/08/2019   TRIG (H) 01/11/2019    462.0 Triglyceride is over 400; calculations on Lipids are invalid.   TRIG 236.0 (H) 10/29/2018   Lab Results  Component Value Date   CHOLHDL 4 03/08/2019   CHOLHDL 4 01/11/2019   CHOLHDL 4 10/29/2018   Lab Results  Component Value Date   LDLDIRECT 83.0 01/11/2019   LDLDIRECT 109.0 10/29/2018   LDLDIRECT 101.0 07/23/2017             .  Most recent eye exam was In 9/18, she does not know the eye doctor's name   Hypertension:  On treatment from PCP; taking atenolol Also on Invokana  BP Readings from Last 3 Encounters:  01/14/19 106/60  01/13/19 110/60  11/09/18 140/82     NEUROPATHY: Has persistent numbness for in her feet the last several years along with some pain  Also has weakness in her legs  She is taking Cymbalta and large doses of gabapentin with relief   Last foot exam in 3/20 done by her PCP, previous findings:  Monofilament sensation is decreased distally on all the toes and distal plantar surfaces    Physical Examination:  There were no vitals taken for this visit.      ASSESSMENT:  Diabetes type 2,  with morbid obesity and long-standing poor control  See history of present illness for detailed discussion of  current management, blood sugar patterns and problems identified   Her A1c is consistently high but slightly better at 9.6  As discussed above she needs to do   better with her diet, glucose monitoring and taking mealtime insulin Discussed importance of better blood sugar control to prevent further complications and worsening of neuropathy Discussed A1c target of under 7 and that most likely postprandial blood sugars are higher and contributing to high A1c  LIPIDS: Previously had significant increase in triglycerides and this is better with adding fenofibrate Also blood sugars are somewhat better LDL below 100 with pravastatin taken 4 days a week  PLAN:   She since she can try to set up a reminder as suggested to check her blood sugars at bedtime, she will program her Alexa to do this She will keep her NovoLog on her dining table so that she can remember to take it before breakfast and dinner For now we will try 15 at breakfast and 20 at suppertime Discussed importance of keeping postprandial readings under 180 at least Instructions were sent to patient by my chart No change in Toujeo She will need to cut back on snacks and high carbohydrate foods   There are no Patient Instructions on file for this visit.  Counseling time on subjects discussed in assessment and plan sections is over 50% of today's 25 minute visit     Elayne Snare 03/24/2019, 9:26 AM   Note: This office note was prepared with Dragon voice recognition system technology. Any transcriptional errors that result from this process are unintentional.

## 2019-04-01 ENCOUNTER — Other Ambulatory Visit: Payer: Self-pay | Admitting: Endocrinology

## 2019-04-11 ENCOUNTER — Other Ambulatory Visit: Payer: Self-pay | Admitting: Endocrinology

## 2019-04-19 ENCOUNTER — Other Ambulatory Visit: Payer: Self-pay | Admitting: Endocrinology

## 2019-04-23 ENCOUNTER — Other Ambulatory Visit: Payer: Self-pay | Admitting: Internal Medicine

## 2019-05-10 DIAGNOSIS — F321 Major depressive disorder, single episode, moderate: Secondary | ICD-10-CM | POA: Diagnosis not present

## 2019-05-10 DIAGNOSIS — L03031 Cellulitis of right toe: Secondary | ICD-10-CM | POA: Diagnosis not present

## 2019-05-10 DIAGNOSIS — M170 Bilateral primary osteoarthritis of knee: Secondary | ICD-10-CM | POA: Diagnosis not present

## 2019-05-10 DIAGNOSIS — E785 Hyperlipidemia, unspecified: Secondary | ICD-10-CM | POA: Diagnosis not present

## 2019-05-10 DIAGNOSIS — R600 Localized edema: Secondary | ICD-10-CM | POA: Diagnosis not present

## 2019-05-10 DIAGNOSIS — I10 Essential (primary) hypertension: Secondary | ICD-10-CM | POA: Diagnosis not present

## 2019-05-10 DIAGNOSIS — E1165 Type 2 diabetes mellitus with hyperglycemia: Secondary | ICD-10-CM | POA: Diagnosis not present

## 2019-05-10 DIAGNOSIS — I872 Venous insufficiency (chronic) (peripheral): Secondary | ICD-10-CM | POA: Diagnosis not present

## 2019-05-10 DIAGNOSIS — N179 Acute kidney failure, unspecified: Secondary | ICD-10-CM | POA: Diagnosis not present

## 2019-05-10 DIAGNOSIS — I4891 Unspecified atrial fibrillation: Secondary | ICD-10-CM | POA: Diagnosis not present

## 2019-05-10 DIAGNOSIS — M160 Bilateral primary osteoarthritis of hip: Secondary | ICD-10-CM | POA: Diagnosis not present

## 2019-05-10 DIAGNOSIS — N183 Chronic kidney disease, stage 3 (moderate): Secondary | ICD-10-CM | POA: Diagnosis not present

## 2019-05-12 DIAGNOSIS — I10 Essential (primary) hypertension: Secondary | ICD-10-CM | POA: Diagnosis not present

## 2019-05-12 DIAGNOSIS — I872 Venous insufficiency (chronic) (peripheral): Secondary | ICD-10-CM | POA: Diagnosis not present

## 2019-05-12 DIAGNOSIS — E1142 Type 2 diabetes mellitus with diabetic polyneuropathy: Secondary | ICD-10-CM | POA: Diagnosis not present

## 2019-05-12 DIAGNOSIS — R600 Localized edema: Secondary | ICD-10-CM | POA: Diagnosis not present

## 2019-05-12 DIAGNOSIS — E1165 Type 2 diabetes mellitus with hyperglycemia: Secondary | ICD-10-CM | POA: Diagnosis not present

## 2019-05-12 DIAGNOSIS — E785 Hyperlipidemia, unspecified: Secondary | ICD-10-CM | POA: Diagnosis not present

## 2019-05-12 DIAGNOSIS — L03031 Cellulitis of right toe: Secondary | ICD-10-CM | POA: Diagnosis not present

## 2019-05-12 DIAGNOSIS — N183 Chronic kidney disease, stage 3 (moderate): Secondary | ICD-10-CM | POA: Diagnosis not present

## 2019-05-13 ENCOUNTER — Other Ambulatory Visit: Payer: Self-pay | Admitting: Endocrinology

## 2019-05-13 DIAGNOSIS — F321 Major depressive disorder, single episode, moderate: Secondary | ICD-10-CM | POA: Diagnosis not present

## 2019-05-13 DIAGNOSIS — N183 Chronic kidney disease, stage 3 (moderate): Secondary | ICD-10-CM | POA: Diagnosis not present

## 2019-05-13 DIAGNOSIS — M170 Bilateral primary osteoarthritis of knee: Secondary | ICD-10-CM | POA: Diagnosis not present

## 2019-05-13 DIAGNOSIS — E1142 Type 2 diabetes mellitus with diabetic polyneuropathy: Secondary | ICD-10-CM | POA: Diagnosis not present

## 2019-05-13 DIAGNOSIS — M160 Bilateral primary osteoarthritis of hip: Secondary | ICD-10-CM | POA: Diagnosis not present

## 2019-05-13 DIAGNOSIS — E119 Type 2 diabetes mellitus without complications: Secondary | ICD-10-CM | POA: Diagnosis not present

## 2019-05-13 DIAGNOSIS — E785 Hyperlipidemia, unspecified: Secondary | ICD-10-CM | POA: Diagnosis not present

## 2019-05-13 DIAGNOSIS — E1165 Type 2 diabetes mellitus with hyperglycemia: Secondary | ICD-10-CM | POA: Diagnosis not present

## 2019-05-13 DIAGNOSIS — E1136 Type 2 diabetes mellitus with diabetic cataract: Secondary | ICD-10-CM | POA: Diagnosis not present

## 2019-05-13 DIAGNOSIS — I4891 Unspecified atrial fibrillation: Secondary | ICD-10-CM | POA: Diagnosis not present

## 2019-05-13 DIAGNOSIS — I10 Essential (primary) hypertension: Secondary | ICD-10-CM | POA: Diagnosis not present

## 2019-05-13 DIAGNOSIS — N179 Acute kidney failure, unspecified: Secondary | ICD-10-CM | POA: Diagnosis not present

## 2019-05-17 DIAGNOSIS — B078 Other viral warts: Secondary | ICD-10-CM | POA: Diagnosis not present

## 2019-05-17 DIAGNOSIS — E1165 Type 2 diabetes mellitus with hyperglycemia: Secondary | ICD-10-CM | POA: Diagnosis not present

## 2019-05-17 DIAGNOSIS — L97921 Non-pressure chronic ulcer of unspecified part of left lower leg limited to breakdown of skin: Secondary | ICD-10-CM | POA: Diagnosis not present

## 2019-05-17 DIAGNOSIS — E1142 Type 2 diabetes mellitus with diabetic polyneuropathy: Secondary | ICD-10-CM | POA: Diagnosis not present

## 2019-05-17 DIAGNOSIS — R600 Localized edema: Secondary | ICD-10-CM | POA: Diagnosis not present

## 2019-05-17 DIAGNOSIS — I10 Essential (primary) hypertension: Secondary | ICD-10-CM | POA: Diagnosis not present

## 2019-05-17 DIAGNOSIS — L03031 Cellulitis of right toe: Secondary | ICD-10-CM | POA: Diagnosis not present

## 2019-05-17 DIAGNOSIS — R1013 Epigastric pain: Secondary | ICD-10-CM | POA: Diagnosis not present

## 2019-05-17 DIAGNOSIS — E785 Hyperlipidemia, unspecified: Secondary | ICD-10-CM | POA: Diagnosis not present

## 2019-05-17 DIAGNOSIS — I872 Venous insufficiency (chronic) (peripheral): Secondary | ICD-10-CM | POA: Diagnosis not present

## 2019-05-17 DIAGNOSIS — N183 Chronic kidney disease, stage 3 (moderate): Secondary | ICD-10-CM | POA: Diagnosis not present

## 2019-05-22 ENCOUNTER — Other Ambulatory Visit: Payer: Self-pay | Admitting: Endocrinology

## 2019-05-23 ENCOUNTER — Other Ambulatory Visit: Payer: PPO

## 2019-05-24 ENCOUNTER — Other Ambulatory Visit (INDEPENDENT_AMBULATORY_CARE_PROVIDER_SITE_OTHER): Payer: PPO

## 2019-05-24 ENCOUNTER — Other Ambulatory Visit: Payer: Self-pay

## 2019-05-24 DIAGNOSIS — E785 Hyperlipidemia, unspecified: Secondary | ICD-10-CM | POA: Diagnosis not present

## 2019-05-24 DIAGNOSIS — E1142 Type 2 diabetes mellitus with diabetic polyneuropathy: Secondary | ICD-10-CM | POA: Diagnosis not present

## 2019-05-24 DIAGNOSIS — Z794 Long term (current) use of insulin: Secondary | ICD-10-CM

## 2019-05-24 DIAGNOSIS — I872 Venous insufficiency (chronic) (peripheral): Secondary | ICD-10-CM | POA: Diagnosis not present

## 2019-05-24 DIAGNOSIS — L03031 Cellulitis of right toe: Secondary | ICD-10-CM | POA: Diagnosis not present

## 2019-05-24 DIAGNOSIS — R1013 Epigastric pain: Secondary | ICD-10-CM | POA: Diagnosis not present

## 2019-05-24 DIAGNOSIS — I10 Essential (primary) hypertension: Secondary | ICD-10-CM | POA: Diagnosis not present

## 2019-05-24 DIAGNOSIS — L97921 Non-pressure chronic ulcer of unspecified part of left lower leg limited to breakdown of skin: Secondary | ICD-10-CM | POA: Diagnosis not present

## 2019-05-24 DIAGNOSIS — B078 Other viral warts: Secondary | ICD-10-CM | POA: Diagnosis not present

## 2019-05-24 DIAGNOSIS — E1165 Type 2 diabetes mellitus with hyperglycemia: Secondary | ICD-10-CM

## 2019-05-24 DIAGNOSIS — F321 Major depressive disorder, single episode, moderate: Secondary | ICD-10-CM | POA: Diagnosis not present

## 2019-05-24 DIAGNOSIS — R600 Localized edema: Secondary | ICD-10-CM | POA: Diagnosis not present

## 2019-05-24 LAB — BASIC METABOLIC PANEL
BUN: 30 mg/dL — ABNORMAL HIGH (ref 6–23)
CO2: 23 mEq/L (ref 19–32)
Calcium: 9.2 mg/dL (ref 8.4–10.5)
Chloride: 106 mEq/L (ref 96–112)
Creatinine, Ser: 1.06 mg/dL (ref 0.40–1.20)
GFR: 51.86 mL/min — ABNORMAL LOW (ref >60.00)
Glucose, Bld: 148 mg/dL — ABNORMAL HIGH (ref 70–99)
Potassium: 3.4 mEq/L — ABNORMAL LOW (ref 3.5–5.1)
Sodium: 139 mEq/L (ref 135–145)

## 2019-05-25 LAB — FRUCTOSAMINE: Fructosamine: 238 umol/L (ref 0–285)

## 2019-05-26 ENCOUNTER — Encounter: Payer: Self-pay | Admitting: Endocrinology

## 2019-05-26 ENCOUNTER — Other Ambulatory Visit: Payer: Self-pay

## 2019-05-26 ENCOUNTER — Ambulatory Visit (INDEPENDENT_AMBULATORY_CARE_PROVIDER_SITE_OTHER): Payer: PPO | Admitting: Endocrinology

## 2019-05-26 DIAGNOSIS — E782 Mixed hyperlipidemia: Secondary | ICD-10-CM

## 2019-05-26 DIAGNOSIS — Z794 Long term (current) use of insulin: Secondary | ICD-10-CM

## 2019-05-26 DIAGNOSIS — E1142 Type 2 diabetes mellitus with diabetic polyneuropathy: Secondary | ICD-10-CM

## 2019-05-26 DIAGNOSIS — E1165 Type 2 diabetes mellitus with hyperglycemia: Secondary | ICD-10-CM | POA: Diagnosis not present

## 2019-05-26 NOTE — Progress Notes (Signed)
Patient ID: Melanie Reeves, female   DOB: Mar 18, 1951, 68 y.o.   MRN: [id number]           Reason for Appointment: Follow-up  for Type 2 Diabetes  Referring physician: Yaakov Guthrie  Today's office visit was provided via telemedicine using video technique The patient was explained the limitations of evaluation and management by telemedicine and the availability of in person appointments.  The patient understood the limitations and agreed to proceed. Patient also understood that the telehealth visit is billable. . Location of the patient: Patient's home . Location of the provider: Physician office Only the patient and myself were participating in the encounter    History of Present Illness:          Date of diagnosis of type 2 diabetes mellitus:  2011       Background history:     She was tested for diabetes when she presented with numbness and tingling in her feet to the [facility]. She does not know what her blood sugars were initially  She was however started on both metformin and glipizide at that time. She thinks that in early 2015 she was started on Lantus insulin also developed poor control  Her record indicates A1c levels ranging from 8.2-9.3 and higher at 11.4 in 9/16 She had been started on Victoza in 12/16  Recent history:   INSULIN regimen is: Toujeo 120 U in am, NovoLog 20-30 AC  Non-insulin hypoglycemic drugs the patient is taking are: Invokana 300 mg,  Metformin ER 2000 mg daily, Victoza 1.57m  A1c was on the last visit still high at 9.6 compared to 10.3  However her fructosamine is excellent at 238  Current blood sugar patterns and problems identified:  She is reporting that she is taking her NovoLog more consistently before meals  On her own she has taken 20 units but will take 30 units if eating more carbohydrate  However she still has periodic readings over 200 in the morning and blood sugars are not consistently controlled  She thinks that  some of her high readings in the mornings are from eating more carbohydrates the night before such as potatoes or chips  She does try to take her Toujeo regularly  No problems with hypoglycemia reported with her current regimen  Again forgetting to check her blood sugars after meals and is doing readings only in the morning  Creatinine is still quite normal with continuing Invokana 300 mg  Her weight is unreliable because of recent problems with edema and was 303   Side effects from medications have been: none  Compliance with the medical regimen: inconsistent  Glucose monitoring:        Glucometer:  One Touch       Blood Glucose readings:  FASTING range recently 147-229, no readings after meals.  No average blood sugar readings available  Previous readings 162-229    Self-care: The diet that the patient has been following is: None, not restricting carbohydrates and fats;   eating more starchy foods and bread at times    Meal times: Breakfast:10-11 AM Lunch:4 PM Dinner: 7 pm    Typical meal intake: Breakfast is eggs/ low fat meat..  Lunch.  Evening meal is meat, vegetables,                 Dietician visit, most recent: Years ago               Exercise:  none  Weight history: Lowest  178 lb several years ago, more recently 300-330  Wt Readings from Last 3 Encounters:  01/14/19 (!) 302 lb (137 kg)  01/13/19 (!) 302 lb 3.2 oz (137.1 kg)  11/09/18 (!) 300 lb 3.2 oz (136.2 kg)   Glycemic control:   Lab Results  Component Value Date   HGBA1C 9.6 (H) 03/08/2019   HGBA1C 10.3 (H) 10/29/2018   HGBA1C 9.9 (H) 06/25/2018   Lab Results  Component Value Date   MICROALBUR 9.1 (H) 10/29/2018   LDLCALC 66 03/08/2019   CREATININE 1.06 05/24/2019      Lab on 05/24/2019  Component Date Value Ref Range Status  . Fructosamine 05/24/2019 238  0 - 285 umol/L Final   Comment: Published reference interval for apparently healthy subjects between age 79 and 3 is 44 - 285 umol/L  and in a poorly controlled diabetic population is 228 - 563 umol/L with a mean of 396 umol/L.   Marland Kitchen Sodium 05/24/2019 139  135 - 145 mEq/L Final  . Potassium 05/24/2019 3.4* 3.5 - 5.1 mEq/L Final  . Chloride 05/24/2019 106  96 - 112 mEq/L Final  . CO2 05/24/2019 23  19 - 32 mEq/L Final  . Glucose, Bld 05/24/2019 148* 70 - 99 mg/dL Final  . BUN 05/24/2019 30* 6 - 23 mg/dL Final  . Creatinine, Ser 05/24/2019 1.06  0.40 - 1.20 mg/dL Final  . Calcium 05/24/2019 9.2  8.4 - 10.5 mg/dL Final  . GFR 05/24/2019 51.86* >60.00 mL/min Final      Allergies as of 05/26/2019      Reactions   Sulfa Antibiotics Other (See Comments)   Cant remember      Medication List       Accurate as of May 26, 2019  1:25 PM. If you have any questions, ask your nurse or doctor.        Accu-Chek FastClix Lancets Misc Use as instructed to test three times daily. DX: E11.65   atenolol 50 MG tablet Commonly known as: TENORMIN TAKE 1 TABLET BY MOUTH TWICE A DAY   B-D ULTRAFINE III SHORT PEN 31G X 8 MM Misc Generic drug: Insulin Pen Needle USE AS DIRECTED   DULoxetine 60 MG capsule Commonly known as: CYMBALTA Take 60 mg by mouth daily. TAKE 1 TABLET BY MOUTH ONCE DAILY.   fenofibrate 145 MG tablet Commonly known as: TRICOR TAKE 1 TABLET BY MOUTH EVERY DAY   Fish Oil 1000 MG Caps Take 2 capsules by mouth daily.   gabapentin 600 MG tablet Commonly known as: NEURONTIN TAKE 1 TABLET BY MOUTH 3 TIMES A DAY AND 1 TABLET AT BEDTIME   HYDROcodone-acetaminophen 5-325 MG tablet Commonly known as: NORCO/VICODIN every 6 (six) hours as needed.   Invokana 300 MG Tabs tablet Generic drug: canagliflozin TAKE 1 TABLET (300 MG TOTAL) BY MOUTH DAILY BEFORE BREAKFAST.   latanoprost 0.005 % ophthalmic solution Commonly known as: XALATAN Place 1 drop into both eyes at bedtime.   loratadine 10 MG tablet Commonly known as: CLARITIN Take 10 mg by mouth daily.   metFORMIN 500 MG 24 hr tablet Commonly known  as: GLUCOPHAGE-XR Take 4 tablets (2,000 mg total) by mouth daily with supper.   NovoLOG FlexPen 100 UNIT/ML FlexPen Generic drug: insulin aspart Inject 20-30 Units into the skin 3 (three) times daily with meals. Inject 20-30 units under the skin 3 times daily.   OneTouch Ultra test strip Generic drug: glucose blood USE AS INSTRUCTED TO CHECK BLOOD SUGAR THREE TIMES DAILY.   OneTouch  Verio w/Device Kit UAD to monitor glucose tid; Dx: E11.65   pravastatin 40 MG tablet Commonly known as: PRAVACHOL TAKE 1 TABLET BY MOUTH EVERY DAY   rivaroxaban 20 MG Tabs tablet Commonly known as: Xarelto Take 1 tablet (20 mg total) by mouth daily with supper.   tiZANidine 2 MG tablet Commonly known as: ZANAFLEX Take 2 mg by mouth every 6 (six) hours as needed for muscle spasms.   Toujeo Max SoloStar 300 UNIT/ML Sopn Generic drug: Insulin Glargine (2 Unit Dial) Inject 120 Units into the skin. Inject 120 units under the skin once daily. What changed: Another medication with the same name was removed. Continue taking this medication, and follow the directions you see here. Changed by: Elayne Snare, MD   Victoza 18 MG/3ML Sopn Generic drug: liraglutide INJECT 0.3 MLS (1.8 MG TOTAL) INTO THE SKIN DAILY. INJECT ONCE DAILY AT THE SAME TIME       Allergies:  Allergies  Allergen Reactions  . Sulfa Antibiotics Other (See Comments)    Cant remember    Past Medical History:  Diagnosis Date  . Abnormal electrocardiogram 08/14/2014   LBBB  . Atrial fibrillation ([place])   . Diabetes [place]   . [place] DEGENERATION 12/20/2009   Qualifier: Diagnosis of  By: Aline Brochure MD, Dorothyann Peng    . Dyspnea 08/14/2014  . Hyperlipidemia   . Hypertension   . LOW BACK PAIN 12/20/2009   Qualifier: Diagnosis of  By: Aline Brochure MD, Dorothyann Peng    . Morbid obesity ([place])   . OSA (obstructive sleep apnea) 08/16/2014  . Seasonal allergies   . SPINAL STENOSIS 12/20/2009   Qualifier: Diagnosis of  By: Aline Brochure MD, Dorothyann Peng       Past Surgical History:  Procedure Laterality Date  . CHOLECYSTECTOMY    . COLONOSCOPY WITH PROPOFOL N/A 11/29/2014   Procedure: COLONOSCOPY WITH PROPOFOL;  Surgeon: Arta Silence, MD;  Location: [facility];  Service: Endoscopy;  Laterality: N/A;  . TONSILLECTOMY      Family History  Problem Relation Age of Onset  . Heart disease Father        MI  . Cancer - Lung Father        Small cell Lung CA    Social History:  reports that she quit smoking about 25 years ago. Her smoking use included cigarettes. She has a 40.00 pack-year smoking history. She has quit using smokeless tobacco. She reports current alcohol use. She reports that she does not use drugs.    Review of Systems    Lipid history:    Triglycerides have been high and she is now taking fenofibrate in addition to her pravastatin  She is taking pravastatin 4 days a week since she had intolerance to simvastatin before LDL is improved and triglycerides are below 200    Lab Results  Component Value Date   CHOL 138 03/08/2019   CHOL 158 01/11/2019   CHOL 178 10/29/2018   Lab Results  Component Value Date   HDL 33.20 (L) 03/08/2019   HDL 37.10 (L) 01/11/2019   HDL 39.70 10/29/2018   Lab Results  Component Value Date   LDLCALC 66 03/08/2019   LDLCALC 104 (H) 09/17/2015   LDLCALC 115 07/02/2015   Lab Results  Component Value Date   TRIG 194.0 (H) 03/08/2019   TRIG (H) 01/11/2019    462.0 Triglyceride is over 400; calculations on Lipids are invalid.   TRIG 236.0 (H) 10/29/2018   Lab Results  Component Value Date   CHOLHDL  4 03/08/2019   CHOLHDL 4 01/11/2019   CHOLHDL 4 10/29/2018   Lab Results  Component Value Date   LDLDIRECT 83.0 01/11/2019   LDLDIRECT 109.0 10/29/2018   LDLDIRECT 101.0 07/23/2017             .  Most recent eye exam was In 9/18   Hypertension:  On treatment from PCP; taking atenolol Also on Invokana  BP Readings from Last 3 Encounters:  01/14/19 106/60  01/13/19 110/60   11/09/18 140/82     NEUROPATHY: Has persistent numbness for in her feet the last several years along with some pain and weakness She has occasional pain and is asking about applying some cream at times  She is taking Cymbalta and 600 mg doses of gabapentin with relief   Last foot exam in 3/20 done by her PCP, previous findings:  Monofilament sensation is decreased distally on all the toes and distal plantar surfaces    Physical Examination:  There were no vitals taken for this visit.      ASSESSMENT:  Diabetes type 2,  with morbid obesity and long-standing poor control  See history of present illness for detailed discussion of  current management, blood sugar patterns and problems identified  Her A1c was last 9.6  Her fructosamine is 238 and likely indicates improved blood sugar control This may be likely from her starting to take her NovoLog more consistently before each meal  However her home blood sugars are still mostly in the upper 100 range and occasionally over 200 also She thinks this is the result of her diet being inconsistent with amounts of carbohydrate intake even though she is trying to plan better with eating more vegetables Not able to lose weight despite Invokana and Victoza  Again is very insulin resistant and taking 120 units of basal insulin She forgets to check her readings after meals despite constant reminders  LIPIDS: Previously well controlled on 2 drugs  Edema: She is taking more Lasix now and will continue to follow-up with PCP  PLAN:   She since she can try to set up a reminder at the time of her meals to do a 2-hour postprandial reading, she will program her Alexa to help her with this She will increase Toujeo up to 125 units at least and may need more if her fasting readings are continue to be high  Discussed postprandial targets of under 180 with continued use of NovoLog If her sugars after supper are consistently high she will need to  increase her NovoLog by at least 5 units Continue Victoza and Invokana Although she may be a good candidate for insulin pump it is unlikely that she can learn how to do it or also use it appropriately or reliably Discussed importance of checking blood sugars  NEUROPATHY: She can use capsaicin cream OTC in addition to her systemic medications for relief of neuropathic pain, likely that the prescription patch is covered by her insurance  HYPOKALEMIA: She will discuss treatment with PCP and labs have been forwarded    There are no Patient Instructions on file for this visit.  Total visit time for evaluation and management of multiple problems and counseling =25 minutes    Elayne Snare 05/26/2019, 1:25 PM   Note: This office note was prepared with Dragon voice recognition system technology. Any transcriptional errors that result from this process are unintentional.

## 2019-06-14 DIAGNOSIS — F321 Major depressive disorder, single episode, moderate: Secondary | ICD-10-CM | POA: Diagnosis not present

## 2019-06-14 DIAGNOSIS — R600 Localized edema: Secondary | ICD-10-CM | POA: Diagnosis not present

## 2019-06-14 DIAGNOSIS — E785 Hyperlipidemia, unspecified: Secondary | ICD-10-CM | POA: Diagnosis not present

## 2019-06-14 DIAGNOSIS — L97921 Non-pressure chronic ulcer of unspecified part of left lower leg limited to breakdown of skin: Secondary | ICD-10-CM | POA: Diagnosis not present

## 2019-06-14 DIAGNOSIS — I872 Venous insufficiency (chronic) (peripheral): Secondary | ICD-10-CM | POA: Diagnosis not present

## 2019-06-14 DIAGNOSIS — E1142 Type 2 diabetes mellitus with diabetic polyneuropathy: Secondary | ICD-10-CM | POA: Diagnosis not present

## 2019-06-14 DIAGNOSIS — I10 Essential (primary) hypertension: Secondary | ICD-10-CM | POA: Diagnosis not present

## 2019-06-14 DIAGNOSIS — E1165 Type 2 diabetes mellitus with hyperglycemia: Secondary | ICD-10-CM | POA: Diagnosis not present

## 2019-06-14 DIAGNOSIS — N183 Chronic kidney disease, stage 3 (moderate): Secondary | ICD-10-CM | POA: Diagnosis not present

## 2019-06-14 DIAGNOSIS — L97911 Non-pressure chronic ulcer of unspecified part of right lower leg limited to breakdown of skin: Secondary | ICD-10-CM | POA: Diagnosis not present

## 2019-06-14 DIAGNOSIS — R1013 Epigastric pain: Secondary | ICD-10-CM | POA: Diagnosis not present

## 2019-06-14 DIAGNOSIS — M19031 Primary osteoarthritis, right wrist: Secondary | ICD-10-CM | POA: Diagnosis not present

## 2019-06-21 DIAGNOSIS — E785 Hyperlipidemia, unspecified: Secondary | ICD-10-CM | POA: Diagnosis not present

## 2019-06-21 DIAGNOSIS — R600 Localized edema: Secondary | ICD-10-CM | POA: Diagnosis not present

## 2019-06-21 DIAGNOSIS — E1165 Type 2 diabetes mellitus with hyperglycemia: Secondary | ICD-10-CM | POA: Diagnosis not present

## 2019-06-21 DIAGNOSIS — E1142 Type 2 diabetes mellitus with diabetic polyneuropathy: Secondary | ICD-10-CM | POA: Diagnosis not present

## 2019-06-21 DIAGNOSIS — N183 Chronic kidney disease, stage 3 (moderate): Secondary | ICD-10-CM | POA: Diagnosis not present

## 2019-06-21 DIAGNOSIS — L97911 Non-pressure chronic ulcer of unspecified part of right lower leg limited to breakdown of skin: Secondary | ICD-10-CM | POA: Diagnosis not present

## 2019-06-21 DIAGNOSIS — I10 Essential (primary) hypertension: Secondary | ICD-10-CM | POA: Diagnosis not present

## 2019-06-21 DIAGNOSIS — I872 Venous insufficiency (chronic) (peripheral): Secondary | ICD-10-CM | POA: Diagnosis not present

## 2019-06-21 DIAGNOSIS — L97921 Non-pressure chronic ulcer of unspecified part of left lower leg limited to breakdown of skin: Secondary | ICD-10-CM | POA: Diagnosis not present

## 2019-06-21 DIAGNOSIS — F321 Major depressive disorder, single episode, moderate: Secondary | ICD-10-CM | POA: Diagnosis not present

## 2019-06-21 DIAGNOSIS — B078 Other viral warts: Secondary | ICD-10-CM | POA: Diagnosis not present

## 2019-07-03 ENCOUNTER — Other Ambulatory Visit: Payer: Self-pay | Admitting: Endocrinology

## 2019-07-05 DIAGNOSIS — F321 Major depressive disorder, single episode, moderate: Secondary | ICD-10-CM | POA: Diagnosis not present

## 2019-07-05 DIAGNOSIS — N183 Chronic kidney disease, stage 3 (moderate): Secondary | ICD-10-CM | POA: Diagnosis not present

## 2019-07-05 DIAGNOSIS — E1165 Type 2 diabetes mellitus with hyperglycemia: Secondary | ICD-10-CM | POA: Diagnosis not present

## 2019-07-05 DIAGNOSIS — E1142 Type 2 diabetes mellitus with diabetic polyneuropathy: Secondary | ICD-10-CM | POA: Diagnosis not present

## 2019-07-05 DIAGNOSIS — I10 Essential (primary) hypertension: Secondary | ICD-10-CM | POA: Diagnosis not present

## 2019-07-05 DIAGNOSIS — N179 Acute kidney failure, unspecified: Secondary | ICD-10-CM | POA: Diagnosis not present

## 2019-07-05 DIAGNOSIS — M1990 Unspecified osteoarthritis, unspecified site: Secondary | ICD-10-CM | POA: Diagnosis not present

## 2019-07-05 DIAGNOSIS — M160 Bilateral primary osteoarthritis of hip: Secondary | ICD-10-CM | POA: Diagnosis not present

## 2019-07-05 DIAGNOSIS — I4891 Unspecified atrial fibrillation: Secondary | ICD-10-CM | POA: Diagnosis not present

## 2019-07-05 DIAGNOSIS — E785 Hyperlipidemia, unspecified: Secondary | ICD-10-CM | POA: Diagnosis not present

## 2019-07-05 DIAGNOSIS — E1136 Type 2 diabetes mellitus with diabetic cataract: Secondary | ICD-10-CM | POA: Diagnosis not present

## 2019-07-05 DIAGNOSIS — E119 Type 2 diabetes mellitus without complications: Secondary | ICD-10-CM | POA: Diagnosis not present

## 2019-07-16 ENCOUNTER — Other Ambulatory Visit: Payer: Self-pay | Admitting: Endocrinology

## 2019-07-21 ENCOUNTER — Other Ambulatory Visit: Payer: PPO

## 2019-07-22 ENCOUNTER — Other Ambulatory Visit: Payer: Self-pay

## 2019-07-22 ENCOUNTER — Other Ambulatory Visit (INDEPENDENT_AMBULATORY_CARE_PROVIDER_SITE_OTHER): Payer: PPO

## 2019-07-22 DIAGNOSIS — E782 Mixed hyperlipidemia: Secondary | ICD-10-CM

## 2019-07-22 DIAGNOSIS — Z794 Long term (current) use of insulin: Secondary | ICD-10-CM

## 2019-07-22 DIAGNOSIS — E1165 Type 2 diabetes mellitus with hyperglycemia: Secondary | ICD-10-CM

## 2019-07-22 LAB — LIPID PANEL
Cholesterol: 111 mg/dL (ref 0–200)
HDL: 33.3 mg/dL — ABNORMAL LOW (ref >39.00)
NonHDL: 77.24
Total CHOL/HDL Ratio: 3
Triglycerides: 211 mg/dL — ABNORMAL HIGH (ref 0.0–149.0)
VLDL: 42.2 mg/dL — ABNORMAL HIGH (ref 0.0–40.0)

## 2019-07-22 LAB — COMPREHENSIVE METABOLIC PANEL
ALT: 17 U/L (ref 0–35)
AST: 20 U/L (ref 0–37)
Albumin: 3.9 g/dL (ref 3.5–5.2)
Alkaline Phosphatase: 50 U/L (ref 39–117)
BUN: 25 mg/dL — ABNORMAL HIGH (ref 6–23)
CO2: 30 mEq/L (ref 19–32)
Calcium: 9.7 mg/dL (ref 8.4–10.5)
Chloride: 102 mEq/L (ref 96–112)
Creatinine, Ser: 1.01 mg/dL (ref 0.40–1.20)
GFR: 54.81 mL/min — ABNORMAL LOW (ref >60.00)
Glucose, Bld: 139 mg/dL — ABNORMAL HIGH (ref 70–99)
Potassium: 3.3 mEq/L — ABNORMAL LOW (ref 3.5–5.1)
Sodium: 142 mEq/L (ref 135–145)
Total Bilirubin: 0.3 mg/dL (ref 0.2–1.2)
Total Protein: 7.5 g/dL (ref 6.0–8.3)

## 2019-07-22 LAB — HEMOGLOBIN A1C: Hgb A1c MFr Bld: 8.2 % — ABNORMAL HIGH (ref 4.6–6.5)

## 2019-07-22 LAB — LDL CHOLESTEROL, DIRECT: Direct LDL: 62 mg/dL

## 2019-07-25 NOTE — Progress Notes (Signed)
Patient ID: Melanie Reeves, female   DOB: 07/28/1951, 68 y.o.   MRN: [id number]           Reason for Appointment: Follow-up  for Type 2 Diabetes  Referring physician: Yaakov Guthrie  Today's office visit was provided via telemedicine using video technique The patient was explained the limitations of evaluation and management by telemedicine and the availability of in person appointments.  The patient understood the limitations and agreed to proceed. Patient also understood that the telehealth visit is billable.  Location of the patient: Patient's home  Location of the provider: Physician office Only the patient and myself were participating in the encounter    History of Present Illness:          Date of diagnosis of type 2 diabetes mellitus:  2011       Background history:     She was tested for diabetes when she presented with numbness and tingling in her feet to the health [facility]. She does not know what her blood sugars were initially  She was however started on both metformin and glipizide at that time. She thinks that in early 2015 she was started on Lantus insulin also developed poor control  Her record indicates A1c levels ranging from 8.2-9.3 and higher at 11.4 in 9/16 She had been started on Victoza in 12/16  Recent history:   INSULIN regimen is: Toujeo 125 U in am, NovoLog 20-30 AC  Non-insulin hypoglycemic drugs the patient is taking are: Invokana 300 mg,  Metformin ER 2000 mg daily, Victoza 1.'8mg'$   A1c is 8.2 compared to 9.6   Current blood sugar patterns and problems identified:  She continues to forget to check her blood sugars after meals as directed even though she was going to set up a reminder  She has extremely variable mealtimes and waking up times  She will also sometimes forget to take her mealtime insulin  Fasting blood sugars are still mostly high and have been consistently over 130  She is arbitrarily adjusting her NovoLog based on what  she is eating not clear if she is getting enough insulin since usually not checking any readings after meals except maybe once  She is able to remember to take her Toujeo consistently and has been told to go up 5 minutes on her dose on the last visit  With this her fasting readings are slightly better  Also no hypoglycemia  She is still not able to do any exercise  No change in renal function with taking Invokana  She is usually consistent with Victoza   Side effects from medications have been: none  Compliance with the medical regimen: inconsistent  Glucose monitoring:        Glucometer:  One Touch       Blood Glucose readings:  FASTING range for the last week 131-190  Previously 147-229, no readings after meals.  No average blood sugar readings available   Self-care: The diet that the patient has been following is: None, not restricting carbohydrates and fats;   eating more starchy foods and bread at times    Meal times: Breakfast:10-11 AM Lunch:4 PM Dinner: 7 pm    Typical meal intake: Breakfast is eggs/ low fat meat..  Lunch.  Evening meal is meat, vegetables,                 Dietician visit, most recent: Years ago               Exercise:  none  Weight history: Lowest 178 lb several years ago, more recently 300-330  Wt Readings from Last 3 Encounters:  01/14/19 (!) 302 lb (137 kg)  01/13/19 (!) 302 lb 3.2 oz (137.1 kg)  11/09/18 (!) 300 lb 3.2 oz (136.2 kg)   Glycemic control:   Lab Results  Component Value Date   HGBA1C 8.2 (H) 07/22/2019   HGBA1C 9.6 (H) 03/08/2019   HGBA1C 10.3 (H) 10/29/2018   Lab Results  Component Value Date   MICROALBUR 9.1 (H) 10/29/2018   LDLCALC 66 03/08/2019   CREATININE 1.01 07/22/2019      Lab on 07/22/2019  Component Date Value Ref Range Status   Cholesterol 07/22/2019 111  0 - 200 mg/dL Final   ATP III Classification       Desirable:  < 200 mg/dL               Borderline High:  200 - 239 mg/dL          High:  > =  240 mg/dL   Triglycerides 07/22/2019 211.0* 0.0 - 149.0 mg/dL Final   Normal:  <150 mg/dLBorderline High:  150 - 199 mg/dL   HDL 07/22/2019 33.30* >39.00 mg/dL Final   VLDL 07/22/2019 42.2* 0.0 - 40.0 mg/dL Final   Total CHOL/HDL Ratio 07/22/2019 3   Final                  Men          Women1/2 Average Risk     3.4          3.3Average Risk          5.0          4.42X Average Risk          9.6          7.13X Average Risk          15.0          11.0                       NonHDL 07/22/2019 77.24   Final   NOTE:  Non-HDL goal should be 30 mg/dL higher than patient's LDL goal (i.e. LDL goal of < 70 mg/dL, would have non-HDL goal of < 100 mg/dL)   Sodium 07/22/2019 142  135 - 145 mEq/L Final   Potassium 07/22/2019 3.3* 3.5 - 5.1 mEq/L Final   Chloride 07/22/2019 102  96 - 112 mEq/L Final   CO2 07/22/2019 30  19 - 32 mEq/L Final   Glucose, Bld 07/22/2019 139* 70 - 99 mg/dL Final   BUN 07/22/2019 25* 6 - 23 mg/dL Final   Creatinine, Ser 07/22/2019 1.01  0.40 - 1.20 mg/dL Final   Total Bilirubin 07/22/2019 0.3  0.2 - 1.2 mg/dL Final   Alkaline Phosphatase 07/22/2019 50  39 - 117 U/L Final   AST 07/22/2019 20  0 - 37 U/L Final   ALT 07/22/2019 17  0 - 35 U/L Final   Total Protein 07/22/2019 7.5  6.0 - 8.3 g/dL Final   Albumin 07/22/2019 3.9  3.5 - 5.2 g/dL Final   Calcium 07/22/2019 9.7  8.4 - 10.5 mg/dL Final   GFR 07/22/2019 54.81* >60.00 mL/min Final   Hgb A1c MFr Bld 07/22/2019 8.2* 4.6 - 6.5 % Final   Glycemic Control Guidelines for People with Diabetes:Non Diabetic:  <6%Goal of Therapy: <7%Additional Action Suggested:  >8%    Direct LDL 07/22/2019 62.0  mg/dL Final   Optimal:  <100 mg/dLNear or Above Optimal:  100-129 mg/dLBorderline High:  130-159 mg/dLHigh:  160-189 mg/dLVery High:  >190 mg/dL      Allergies as of 07/26/2019      Reactions   Sulfa Antibiotics Other (See Comments)   Cant remember      Medication List       Accurate as of July 25, 2019   9:37 PM. If you have any questions, ask your nurse or doctor.        Accu-Chek FastClix Lancets Misc Use as instructed to test three times daily. DX: E11.65   atenolol 50 MG tablet Commonly known as: TENORMIN TAKE 1 TABLET BY MOUTH TWICE A DAY   B-D ULTRAFINE III SHORT PEN 31G X 8 MM Misc Generic drug: Insulin Pen Needle USE AS DIRECTED   DULoxetine 60 MG capsule Commonly known as: CYMBALTA Take 60 mg by mouth daily. TAKE 1 TABLET BY MOUTH ONCE DAILY.   fenofibrate 145 MG tablet Commonly known as: TRICOR TAKE 1 TABLET BY MOUTH EVERY DAY   Fish Oil 1000 MG Caps Take 2 capsules by mouth daily.   gabapentin 600 MG tablet Commonly known as: NEURONTIN TAKE 1 TABLET BY MOUTH 3 TIMES A DAY AND 1 TABLET AT BEDTIME   HYDROcodone-acetaminophen 5-325 MG tablet Commonly known as: NORCO/VICODIN every 6 (six) hours as needed.   Invokana 300 MG Tabs tablet Generic drug: canagliflozin TAKE 1 TABLET (300 MG TOTAL) BY MOUTH DAILY BEFORE BREAKFAST.   latanoprost 0.005 % ophthalmic solution Commonly known as: XALATAN Place 1 drop into both eyes at bedtime.   loratadine 10 MG tablet Commonly known as: CLARITIN Take 10 mg by mouth daily.   metFORMIN 500 MG 24 hr tablet Commonly known as: GLUCOPHAGE-XR Take 4 tablets (2,000 mg total) by mouth daily with supper.   NovoLOG FlexPen 100 UNIT/ML FlexPen Generic drug: insulin aspart Inject 20-35 units under the skin three times daily before meals.   OneTouch Ultra test strip Generic drug: glucose blood USE AS INSTRUCTED TO CHECK BLOOD SUGAR THREE TIMES DAILY.   OneTouch Verio w/Device Kit UAD to monitor glucose tid; Dx: E11.65   pravastatin 40 MG tablet Commonly known as: PRAVACHOL TAKE 1 TABLET BY MOUTH EVERY DAY   rivaroxaban 20 MG Tabs tablet Commonly known as: Xarelto Take 1 tablet (20 mg total) by mouth daily with supper.   tiZANidine 2 MG tablet Commonly known as: ZANAFLEX Take 2 mg by mouth every 6 (six) hours as  needed for muscle spasms.   Toujeo Max SoloStar 300 UNIT/ML Sopn Generic drug: Insulin Glargine (2 Unit Dial) Inject 120 Units into the skin. Inject 120 units under the skin once daily.   Victoza 18 MG/3ML Sopn Generic drug: liraglutide INJECT 0.3 MLS (1.8 MG TOTAL) INTO THE SKIN DAILY. 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)    OSA (obstructive sleep  apnea) 08/16/2014   Seasonal allergies    SPINAL STENOSIS 12/20/2009   Qualifier: Diagnosis of  By: Aline Brochure MD, Dorothyann Peng      Past Surgical History:  Procedure Laterality Date   CHOLECYSTECTOMY     COLONOSCOPY WITH PROPOFOL N/A 11/29/2014   Procedure: COLONOSCOPY WITH PROPOFOL;  Surgeon: Arta Silence, MD;  Location: [facility];  Service: Endoscopy;  Laterality: N/A;   TONSILLECTOMY      Family History  Problem Relation Age of Onset   Heart disease Father        MI   Cancer - Lung Father        Small cell Lung CA    Social History:  reports that she quit smoking about 25 years ago. Her smoking use included cigarettes. She has a 40.00 pack-year smoking history. She has quit using smokeless tobacco. She reports current alcohol use. She reports that she does not use drugs.    Review of Systems    Lipid history:    Triglycerides have been high and she is now taking fenofibrate in addition to her pravastatin  She is taking pravastatin 4 days a week since she had intolerance to simvastatin before LDL is improved and  triglycerides are below 200    Lab Results  Component Value Date   CHOL 111 07/22/2019   CHOL 138 03/08/2019   CHOL 158 01/11/2019   Lab Results  Component Value Date   HDL 33.30 (L) 07/22/2019   HDL 33.20 (L) 03/08/2019   HDL 37.10 (L) 01/11/2019   Lab Results  Component Value Date   LDLCALC 66 03/08/2019   LDLCALC 104 (H) 09/17/2015   LDLCALC 115 07/02/2015   Lab Results  Component Value Date   TRIG 211.0 (H) 07/22/2019   TRIG 194.0 (H) 03/08/2019   TRIG (H) 01/11/2019    462.0 Triglyceride is over 400; calculations on Lipids are invalid.   Lab Results  Component Value Date   CHOLHDL 3 07/22/2019   CHOLHDL 4 03/08/2019   CHOLHDL 4 01/11/2019   Lab Results  Component Value Date   LDLDIRECT 62.0 07/22/2019   LDLDIRECT 83.0 01/11/2019   LDLDIRECT 109.0 10/29/2018             .  Most recent eye exam was In 9/18   Hypertension:  On treatment from PCP; taking atenolol Also on Invokana  BP Readings from Last 3 Encounters:  01/14/19 106/60  01/13/19 110/60  11/09/18 140/82     NEUROPATHY: Has persistent numbness for in her feet the last several years along with some pain and weakness   She is taking Cymbalta and 600 mg of gabapentin without adequate relief especially with burning sensation and some difficulty sleeping with this  Last foot exam in 3/20 done by her PCP, previous findings:  Monofilament sensation is decreased distally on all the toes and distal plantar surfaces    Physical Examination:  There were no vitals taken for this visit.      ASSESSMENT:  Diabetes type 2,  with morbid obesity and long-standing poor control  See history of present illness for detailed discussion of  current management, blood sugar patterns and problems identified  Her A1c relatively better at 8.2  She is as before checking blood sugars infrequently and only in the morning recently  However her fasting blood sugars are still consistently over the 130  Target although improved from before She is benefiting from taking mealtime insulin although not consistent with this Also difficult to adjust  her mealtime dose because of lack of postprandial monitoring She is still insulin resistant and has significantly obesity despite being on Invokana and Victoza  NEUROPATHY: Continues to be symptomatic despite large doses of gabapentin Cymbalta She will not be a candidate for Lyrica because of potential for weight gain and edema  LIPIDS:  well controlled on 2 drugs  HYPOKALEMIA Discussed that she likely needs to be on continuous potassium supplementation and she will discuss with PCP  PLAN:   She was again reminded about checking readings after meals and discussed target of at least under 180 This will help her with adjusting NovoLog More consistent mealtime insulin  She will increase Toujeo up to 130 units now Consistent diet  NEUROPATHY: She can use capsaicin cream OTC in addition to her systemic medications for relief of neuropathic pain, appears that that the prescription preparation or patch is not covered by her insurance Meanwhile add another 30 mg of Cymbalta with the evening meal and continue 60 mg in the morning  HYPOKALEMIA: Start potassium supplements and 15-day prescription given    There are no Patient Instructions on file for this visit.     Counseling time on subjects discussed in assessment and plan sections is over 50% of today's 25 minute visit   Elayne Snare 07/25/2019, 9:37 PM   Note: This office note was prepared with Dragon voice recognition system technology. Any transcriptional errors that result from this process are unintentional.

## 2019-07-26 ENCOUNTER — Encounter: Payer: Self-pay | Admitting: Endocrinology

## 2019-07-26 ENCOUNTER — Other Ambulatory Visit: Payer: Self-pay

## 2019-07-26 ENCOUNTER — Ambulatory Visit (INDEPENDENT_AMBULATORY_CARE_PROVIDER_SITE_OTHER): Payer: PPO | Admitting: Endocrinology

## 2019-07-26 DIAGNOSIS — E1165 Type 2 diabetes mellitus with hyperglycemia: Secondary | ICD-10-CM

## 2019-07-26 DIAGNOSIS — E876 Hypokalemia: Secondary | ICD-10-CM | POA: Diagnosis not present

## 2019-07-26 DIAGNOSIS — E1142 Type 2 diabetes mellitus with diabetic polyneuropathy: Secondary | ICD-10-CM

## 2019-07-26 DIAGNOSIS — Z794 Long term (current) use of insulin: Secondary | ICD-10-CM | POA: Diagnosis not present

## 2019-07-26 MED ORDER — POTASSIUM CHLORIDE CRYS ER 20 MEQ PO TBCR: 20.0000 meq | EXTENDED_RELEASE_TABLET | Freq: Every day | ORAL | 0 refills | Status: DC

## 2019-07-26 MED ORDER — DULOXETINE HCL 30 MG PO CPEP: 30.0000 mg | ORAL_CAPSULE | Freq: Every day | ORAL | 3 refills | Status: DC

## 2019-08-02 DIAGNOSIS — R2681 Unsteadiness on feet: Secondary | ICD-10-CM | POA: Diagnosis not present

## 2019-08-02 DIAGNOSIS — E1165 Type 2 diabetes mellitus with hyperglycemia: Secondary | ICD-10-CM | POA: Diagnosis not present

## 2019-08-02 DIAGNOSIS — E1142 Type 2 diabetes mellitus with diabetic polyneuropathy: Secondary | ICD-10-CM | POA: Diagnosis not present

## 2019-08-02 DIAGNOSIS — F321 Major depressive disorder, single episode, moderate: Secondary | ICD-10-CM | POA: Diagnosis not present

## 2019-08-02 DIAGNOSIS — I10 Essential (primary) hypertension: Secondary | ICD-10-CM | POA: Diagnosis not present

## 2019-08-02 DIAGNOSIS — E876 Hypokalemia: Secondary | ICD-10-CM | POA: Diagnosis not present

## 2019-08-02 DIAGNOSIS — R600 Localized edema: Secondary | ICD-10-CM | POA: Diagnosis not present

## 2019-08-02 DIAGNOSIS — I872 Venous insufficiency (chronic) (peripheral): Secondary | ICD-10-CM | POA: Diagnosis not present

## 2019-08-02 DIAGNOSIS — E785 Hyperlipidemia, unspecified: Secondary | ICD-10-CM | POA: Diagnosis not present

## 2019-08-02 DIAGNOSIS — N183 Chronic kidney disease, stage 3 unspecified: Secondary | ICD-10-CM | POA: Diagnosis not present

## 2019-08-14 ENCOUNTER — Other Ambulatory Visit: Payer: Self-pay | Admitting: Endocrinology

## 2019-08-19 ENCOUNTER — Other Ambulatory Visit: Payer: Self-pay | Admitting: Endocrinology

## 2019-08-20 ENCOUNTER — Other Ambulatory Visit: Payer: Self-pay | Admitting: Endocrinology

## 2019-08-30 DIAGNOSIS — M160 Bilateral primary osteoarthritis of hip: Secondary | ICD-10-CM | POA: Diagnosis not present

## 2019-08-30 DIAGNOSIS — M19031 Primary osteoarthritis, right wrist: Secondary | ICD-10-CM | POA: Diagnosis not present

## 2019-08-30 DIAGNOSIS — E1165 Type 2 diabetes mellitus with hyperglycemia: Secondary | ICD-10-CM | POA: Diagnosis not present

## 2019-08-30 DIAGNOSIS — E785 Hyperlipidemia, unspecified: Secondary | ICD-10-CM | POA: Diagnosis not present

## 2019-08-30 DIAGNOSIS — E119 Type 2 diabetes mellitus without complications: Secondary | ICD-10-CM | POA: Diagnosis not present

## 2019-08-30 DIAGNOSIS — F321 Major depressive disorder, single episode, moderate: Secondary | ICD-10-CM | POA: Diagnosis not present

## 2019-08-30 DIAGNOSIS — I10 Essential (primary) hypertension: Secondary | ICD-10-CM | POA: Diagnosis not present

## 2019-08-30 DIAGNOSIS — E1136 Type 2 diabetes mellitus with diabetic cataract: Secondary | ICD-10-CM | POA: Diagnosis not present

## 2019-08-30 DIAGNOSIS — M170 Bilateral primary osteoarthritis of knee: Secondary | ICD-10-CM | POA: Diagnosis not present

## 2019-08-30 DIAGNOSIS — E1142 Type 2 diabetes mellitus with diabetic polyneuropathy: Secondary | ICD-10-CM | POA: Diagnosis not present

## 2019-08-30 DIAGNOSIS — I4891 Unspecified atrial fibrillation: Secondary | ICD-10-CM | POA: Diagnosis not present

## 2019-08-30 DIAGNOSIS — N179 Acute kidney failure, unspecified: Secondary | ICD-10-CM | POA: Diagnosis not present

## 2019-09-02 ENCOUNTER — Other Ambulatory Visit: Payer: Self-pay | Admitting: Endocrinology

## 2019-09-10 ENCOUNTER — Other Ambulatory Visit: Payer: Self-pay | Admitting: Endocrinology

## 2019-10-05 ENCOUNTER — Other Ambulatory Visit: Payer: Self-pay | Admitting: Endocrinology

## 2019-10-05 DIAGNOSIS — E785 Hyperlipidemia, unspecified: Secondary | ICD-10-CM | POA: Diagnosis not present

## 2019-10-05 DIAGNOSIS — I4891 Unspecified atrial fibrillation: Secondary | ICD-10-CM | POA: Diagnosis not present

## 2019-10-05 DIAGNOSIS — E119 Type 2 diabetes mellitus without complications: Secondary | ICD-10-CM | POA: Diagnosis not present

## 2019-10-05 DIAGNOSIS — M1990 Unspecified osteoarthritis, unspecified site: Secondary | ICD-10-CM | POA: Diagnosis not present

## 2019-10-05 DIAGNOSIS — E1165 Type 2 diabetes mellitus with hyperglycemia: Secondary | ICD-10-CM | POA: Diagnosis not present

## 2019-10-05 DIAGNOSIS — N179 Acute kidney failure, unspecified: Secondary | ICD-10-CM | POA: Diagnosis not present

## 2019-10-05 DIAGNOSIS — F321 Major depressive disorder, single episode, moderate: Secondary | ICD-10-CM | POA: Diagnosis not present

## 2019-10-05 DIAGNOSIS — I10 Essential (primary) hypertension: Secondary | ICD-10-CM | POA: Diagnosis not present

## 2019-10-05 DIAGNOSIS — M19031 Primary osteoarthritis, right wrist: Secondary | ICD-10-CM | POA: Diagnosis not present

## 2019-10-05 DIAGNOSIS — E1142 Type 2 diabetes mellitus with diabetic polyneuropathy: Secondary | ICD-10-CM | POA: Diagnosis not present

## 2019-10-05 DIAGNOSIS — E1136 Type 2 diabetes mellitus with diabetic cataract: Secondary | ICD-10-CM | POA: Diagnosis not present

## 2019-10-08 ENCOUNTER — Other Ambulatory Visit: Payer: Self-pay | Admitting: Endocrinology

## 2019-10-20 ENCOUNTER — Other Ambulatory Visit: Payer: PPO

## 2019-10-26 ENCOUNTER — Ambulatory Visit: Payer: PPO | Admitting: Endocrinology

## 2019-11-01 DIAGNOSIS — N179 Acute kidney failure, unspecified: Secondary | ICD-10-CM | POA: Diagnosis not present

## 2019-11-01 DIAGNOSIS — E1165 Type 2 diabetes mellitus with hyperglycemia: Secondary | ICD-10-CM | POA: Diagnosis not present

## 2019-11-01 DIAGNOSIS — Z20822 Contact with and (suspected) exposure to covid-19: Secondary | ICD-10-CM | POA: Diagnosis not present

## 2019-11-01 DIAGNOSIS — E785 Hyperlipidemia, unspecified: Secondary | ICD-10-CM | POA: Diagnosis not present

## 2019-11-01 DIAGNOSIS — I10 Essential (primary) hypertension: Secondary | ICD-10-CM | POA: Diagnosis not present

## 2019-11-01 DIAGNOSIS — I4891 Unspecified atrial fibrillation: Secondary | ICD-10-CM | POA: Diagnosis not present

## 2019-11-03 DIAGNOSIS — Z20822 Contact with and (suspected) exposure to covid-19: Secondary | ICD-10-CM | POA: Diagnosis not present

## 2019-11-03 DIAGNOSIS — I10 Essential (primary) hypertension: Secondary | ICD-10-CM | POA: Diagnosis not present

## 2019-11-07 ENCOUNTER — Other Ambulatory Visit: Payer: Self-pay | Admitting: Endocrinology

## 2019-11-25 ENCOUNTER — Other Ambulatory Visit: Payer: Self-pay | Admitting: Internal Medicine

## 2019-11-28 ENCOUNTER — Other Ambulatory Visit: Payer: Self-pay

## 2019-11-28 ENCOUNTER — Other Ambulatory Visit (INDEPENDENT_AMBULATORY_CARE_PROVIDER_SITE_OTHER): Payer: PPO

## 2019-11-28 DIAGNOSIS — E1165 Type 2 diabetes mellitus with hyperglycemia: Secondary | ICD-10-CM

## 2019-11-28 DIAGNOSIS — Z794 Long term (current) use of insulin: Secondary | ICD-10-CM

## 2019-11-28 LAB — COMPREHENSIVE METABOLIC PANEL
ALT: 27 U/L (ref 0–35)
AST: 24 U/L (ref 0–37)
Albumin: 3.9 g/dL (ref 3.5–5.2)
Alkaline Phosphatase: 73 U/L (ref 39–117)
BUN: 34 mg/dL — ABNORMAL HIGH (ref 6–23)
CO2: 27 mEq/L (ref 19–32)
Calcium: 9.7 mg/dL (ref 8.4–10.5)
Chloride: 98 mEq/L (ref 96–112)
Creatinine, Ser: 1.14 mg/dL (ref 0.40–1.20)
GFR: 47.61 mL/min — ABNORMAL LOW (ref >60.00)
Glucose, Bld: 192 mg/dL — ABNORMAL HIGH (ref 70–99)
Potassium: 4.2 mEq/L (ref 3.5–5.1)
Sodium: 136 mEq/L (ref 135–145)
Total Bilirubin: 0.4 mg/dL (ref 0.2–1.2)
Total Protein: 7.7 g/dL (ref 6.0–8.3)

## 2019-11-28 LAB — HEMOGLOBIN A1C: Hgb A1c MFr Bld: 8.9 % — ABNORMAL HIGH (ref 4.6–6.5)

## 2019-12-01 ENCOUNTER — Ambulatory Visit (INDEPENDENT_AMBULATORY_CARE_PROVIDER_SITE_OTHER): Payer: PPO | Admitting: Endocrinology

## 2019-12-01 ENCOUNTER — Encounter: Payer: Self-pay | Admitting: Endocrinology

## 2019-12-01 DIAGNOSIS — E1165 Type 2 diabetes mellitus with hyperglycemia: Secondary | ICD-10-CM | POA: Diagnosis not present

## 2019-12-01 DIAGNOSIS — E876 Hypokalemia: Secondary | ICD-10-CM

## 2019-12-01 DIAGNOSIS — E782 Mixed hyperlipidemia: Secondary | ICD-10-CM | POA: Diagnosis not present

## 2019-12-01 DIAGNOSIS — Z794 Long term (current) use of insulin: Secondary | ICD-10-CM

## 2019-12-01 NOTE — Progress Notes (Signed)
Patient ID: Melanie Reeves, female   DOB: 1951-08-29, 69 y.o.   MRN: [id number]           Reason for Appointment: Follow-up  for Type 2 Diabetes  Referring physician: Yaakov Guthrie  Today's office visit was provided via telemedicine using video technique The patient was explained the limitations of evaluation and management by telemedicine and the availability of in person appointments.  The patient understood the limitations and agreed to proceed. Patient also understood that the telehealth visit is billable. . Location of the patient: Patient's home . Location of the provider: Physician office Only the patient and myself were participating in the encounter    History of Present Illness:          Date of diagnosis of type 2 diabetes mellitus:  2011       Background history:     She was tested for diabetes when she presented with numbness and tingling in her feet to the [facility]. She does not know what her blood sugars were initially  She was however started on both metformin and glipizide at that time. She thinks that in early 2015 she was started on Lantus insulin also developed poor control  Her record indicates A1c levels ranging from 8.2-9.3 and higher at 11.4 in 9/16 She had been started on Victoza in 12/16  Recent history:   INSULIN regimen is: Toujeo 130 U in am, NovoLog 20- 35 AC  Non-insulin hypoglycemic drugs the patient is taking are: Invokana 300 mg,  Metformin ER 2000 mg daily, Victoza 1.'8mg'$   A1c is 8.9 and has gone up   Current blood sugar patterns and problems identified:  She has not checked her blood sugars in about 10 days  Otherwise slightly checking blood sugars only sporadically and she does not have her meter with her today to reveal her readings  She still does not check readings after meals despite repeated reminders  Her lab glucose was also relatively high at 189  She is usually eating some carbohydrate like rates at breakfast but  usually not taking NovoLog at that time  She has increased her Toujeo 230 units and NovoLog at dinnertime to 35 units  However she thinks she is regularly eating high carbohydrate meals such as potatoes and [place] food  She is using some kind of exercise equipment recently to do some exercise  However she appears to be still gaining weight and previously her weight was about 20 pounds less  She is consistent with Victoza and Invokana   Side effects from medications have been: none  Compliance with the medical regimen: Inadequate  Glucose monitoring:        Glucometer:  One Touch       Blood Glucose readings:  FASTING sugars between about 175 and 225   Self-care: The diet that the patient has been following is: None, not restricting carbohydrates and fats;   eating more starchy foods and bread at times    Meal times: Breakfast:10-11 AM Lunch:4 PM Dinner: 7 pm    Typical meal intake: Breakfast is eggs/ low fat meat..  Lunch.  Evening meal is meat, vegetables,                 Dietician visit, most recent: Years ago               Exercise:  none  Weight history: Lowest 178 lb several years ago, more recently 300-330  Wt Readings from Last 3 Encounters:  01/14/19 (!) 302 lb (137 kg)  01/13/19 (!) 302 lb 3.2 oz (137.1 kg)  11/09/18 (!) 300 lb 3.2 oz (136.2 kg)   Glycemic control:   Lab Results  Component Value Date   HGBA1C 8.9 (H) 11/28/2019   HGBA1C 8.2 (H) 07/22/2019   HGBA1C 9.6 (H) 03/08/2019   Lab Results  Component Value Date   MICROALBUR 9.1 (H) 10/29/2018   LDLCALC 66 03/08/2019   CREATININE 1.14 11/28/2019      Lab on 11/28/2019  Component Date Value Ref Range Status  . Sodium 11/28/2019 136  135 - 145 mEq/L Final  . Potassium 11/28/2019 4.2  3.5 - 5.1 mEq/L Final  . Chloride 11/28/2019 98  96 - 112 mEq/L Final  . CO2 11/28/2019 27  19 - 32 mEq/L Final  . Glucose, Bld 11/28/2019 192* 70 - 99 mg/dL Final  . BUN 11/28/2019 34* 6 - 23 mg/dL Final  .  Creatinine, Ser 11/28/2019 1.14  0.40 - 1.20 mg/dL Final  . Total Bilirubin 11/28/2019 0.4  0.2 - 1.2 mg/dL Final  . Alkaline Phosphatase 11/28/2019 73  39 - 117 U/L Final  . AST 11/28/2019 24  0 - 37 U/L Final  . ALT 11/28/2019 27  0 - 35 U/L Final  . Total Protein 11/28/2019 7.7  6.0 - 8.3 g/dL Final  . Albumin 11/28/2019 3.9  3.5 - 5.2 g/dL Final  . GFR 11/28/2019 47.61* >60.00 mL/min Final  . Calcium 11/28/2019 9.7  8.4 - 10.5 mg/dL Final  . Hgb A1c MFr Bld 11/28/2019 8.9* 4.6 - 6.5 % Final   Glycemic Control Guidelines for People with Diabetes:Non Diabetic:  <6%Goal of Therapy: <7%Additional Action Suggested:  >8%       Allergies as of 12/01/2019      Reactions   Sulfa Antibiotics Other (See Comments)   Cant remember      Medication List       Accurate as of December 01, 2019  1:06 PM. If you have any questions, ask your nurse or doctor.        Accu-Chek FastClix Lancets Misc Use as instructed to test three times daily. DX: E11.65   atenolol 50 MG tablet Commonly known as: TENORMIN TAKE 1 TABLET BY MOUTH TWICE A DAY   B-D ULTRAFINE III SHORT PEN 31G X 8 MM Misc Generic drug: Insulin Pen Needle Use to inject medication 5 times daily.   diclofenac sodium 1 % Gel Commonly known as: VOLTAREN 2 (two) times daily as needed. Apply to affected area twice daily as needed.   DULoxetine 60 MG capsule Commonly known as: CYMBALTA Take 60 mg by mouth daily. TAKE 1 TABLET BY MOUTH ONCE DAILY.   DULoxetine 30 MG capsule Commonly known as: CYMBALTA TAKE 1 CAPSULE (30 MG TOTAL) BY MOUTH DAILY. TAKE IN ADDITION TO THE 60 MG   fenofibrate 145 MG tablet Commonly known as: TRICOR TAKE 1 TABLET BY MOUTH EVERY DAY   Fish Oil 1000 MG Caps Take 2 capsules by mouth daily.   furosemide 40 MG tablet Commonly known as: LASIX Take 40 mg by mouth daily. Take 1 tablet by mouth once daily.   gabapentin 600 MG tablet Commonly known as: NEURONTIN TAKE 1 TABLET BY MOUTH 3 TIMES A DAY  AND 1 TABLET AT BEDTIME   HYDROcodone-acetaminophen 5-325 MG tablet Commonly known as: NORCO/VICODIN every 6 (six) hours as needed.   Invokana 300 MG Tabs tablet Generic drug: canagliflozin TAKE 1 TABLET (300 MG TOTAL) BY MOUTH DAILY  BEFORE BREAKFAST.   latanoprost 0.005 % ophthalmic solution Commonly known as: XALATAN Place 1 drop into both eyes at bedtime.   loratadine 10 MG tablet Commonly known as: CLARITIN Take 10 mg by mouth daily.   metFORMIN 500 MG 24 hr tablet Commonly known as: GLUCOPHAGE-XR TAKE 4 TABLETS (2,000 MG TOTAL) BY MOUTH DAILY WITH SUPPER.   NovoLOG FlexPen 100 UNIT/ML FlexPen Generic drug: insulin aspart INJECT 20-35 UNITS UNDER THE SKIN THREE TIMES DAILY BEFORE MEALS.   OneTouch Ultra test strip Generic drug: glucose blood USE AS INSTRUCTED TO CHECK BLOOD SUGAR THREE TIMES DAILY.   OneTouch Verio w/Device Kit UAD to monitor glucose tid; Dx: E11.65   pantoprazole 40 MG tablet Commonly known as: PROTONIX Take 40 mg by mouth daily. Take 1 tablet by mouth once daily.   potassium chloride SA 20 MEQ tablet Commonly known as: KLOR-CON Take 1 tablet (20 mEq total) by mouth daily.   pravastatin 40 MG tablet Commonly known as: PRAVACHOL TAKE 1 TABLET BY MOUTH EVERY DAY   rivaroxaban 20 MG Tabs tablet Commonly known as: Xarelto Take 1 tablet (20 mg total) by mouth daily with supper.   terazosin 2 MG capsule Commonly known as: HYTRIN Take 2 mg by mouth daily. Take 1 tablet by mouth once daily.   tiZANidine 2 MG tablet Commonly known as: ZANAFLEX Take 2 mg by mouth every 6 (six) hours as needed for muscle spasms.   Toujeo Max SoloStar 300 UNIT/ML Sopn Generic drug: Insulin Glargine (2 Unit Dial) Inject 125 Units into the skin. Inject 125 units under the skin once daily.   Toujeo SoloStar 300 UNIT/ML Sopn Generic drug: Insulin Glargine (1 Unit Dial) INJECT 130 UNITS INTO THE SKIN DAILY.   Victoza 18 MG/3ML Sopn Generic drug:  liraglutide INJECT 0.3 MLS (1.8 MG TOTAL) INTO THE SKIN DAILY. INJECT ONCE DAILY AT THE SAME TIME       Allergies:  Allergies  Allergen Reactions  . Sulfa Antibiotics Other (See Comments)    Cant remember    Past Medical History:  Diagnosis Date  . Abnormal electrocardiogram 08/14/2014   LBBB  . Atrial fibrillation ([place])   . Diabetes mellitus ([place])   . [place] DEGENERATION 12/20/2009   Qualifier: Diagnosis of  By: Aline Brochure MD, Dorothyann Peng    . Dyspnea 08/14/2014  . Hyperlipidemia   . Hypertension   . LOW BACK PAIN 12/20/2009   Qualifier: Diagnosis of  By: Aline Brochure MD, Dorothyann Peng    . Morbid obesity ([place])   . OSA (obstructive sleep apnea) 08/16/2014  . Seasonal allergies   . SPINAL STENOSIS 12/20/2009   Qualifier: Diagnosis of  By: Aline Brochure MD, Dorothyann Peng      Past Surgical History:  Procedure Laterality Date  . CHOLECYSTECTOMY    . COLONOSCOPY WITH PROPOFOL N/A 11/29/2014   Procedure: COLONOSCOPY WITH PROPOFOL;  Surgeon: Arta Silence, MD;  Location: [facility];  Service: Endoscopy;  Laterality: N/A;  . TONSILLECTOMY      Family History  Problem Relation Age of Onset  . Heart disease Father        MI  . Cancer - Lung Father        Small cell Lung CA    Social History:  reports that she quit smoking about 26 years ago. Her smoking use included cigarettes. She has a 40.00 pack-year smoking history. She has quit using smokeless tobacco. She reports current alcohol use. She reports that she does not use drugs.    Review of Systems  Lipid history:    Triglycerides have been over 400 at baseline and she is now taking fenofibrate in addition to her pravastatin  She is taking pravastatin 4 days a week since she had intolerance to simvastatin before LDL is improved and triglycerides are around 200 as of 10/20    Lab Results  Component Value Date   CHOL 111 07/22/2019   CHOL 138 03/08/2019   CHOL 158 01/11/2019   Lab Results  Component Value Date   HDL 33.30 (L)  07/22/2019   HDL 33.20 (L) 03/08/2019   HDL 37.10 (L) 01/11/2019   Lab Results  Component Value Date   LDLCALC 66 03/08/2019   LDLCALC 104 (H) 09/17/2015   LDLCALC 115 07/02/2015   Lab Results  Component Value Date   TRIG 211.0 (H) 07/22/2019   TRIG 194.0 (H) 03/08/2019   TRIG (H) 01/11/2019    462.0 Triglyceride is over 400; calculations on Lipids are invalid.   Lab Results  Component Value Date   CHOLHDL 3 07/22/2019   CHOLHDL 4 03/08/2019   CHOLHDL 4 01/11/2019   Lab Results  Component Value Date   LDLDIRECT 62.0 07/22/2019   LDLDIRECT 83.0 01/11/2019   LDLDIRECT 109.0 10/29/2018             .  Most recent eye exam was In 9/18, is due to have follow-up soon   Hypertension:  On treatment from PCP; taking atenolol Also on Invokana  BP Readings from Last 3 Encounters:  01/14/19 106/60  01/13/19 110/60  11/09/18 140/82   Hypokalemia: She has been started on potassium supplements  Lab Results  Component Value Date   K 4.2 11/28/2019     NEUROPATHY: Has persistent numbness for in her feet the last several years along with some pain and weakness   She is taking Cymbalta a total of 90 mg a day now and 600 mg of gabapentin She thinks she is now getting more relief with burning sensation in her legs and is sleeping better  Last foot exam in 3/20 done by her PCP, previous findings:  Monofilament sensation is decreased distally on all the toes and distal plantar surfaces    Physical Examination:  There were no vitals taken for this visit.      ASSESSMENT:  Diabetes type 2,  with morbid obesity and long-standing poor control  See history of present illness for detailed discussion of  current management, blood sugar patterns and problems identified  Her A1c is back up at 8.9 compared to 8.2  She is again checking blood sugars infrequently and only in the morning with high readings likely averaging around 200  However her fasting blood sugars are  still significantly high even with 130 units of basal insulin Likely not taking her NovoLog consistently at least at breakfast and if she is eating lunch also Appears to be gaining weight despite being on Invokana and Victoza   NEUROPATHY: Symptoms are better with increasing duloxetine  LIPIDS: Previously reasonably controlled on 2 drugs although tends to have high triglyceride likely to be from poor diabetes control and continued weight gain    PLAN:   She will need to check blood sugars twice a day, at least once after one of her meals or at bedtime May need to increase NovoLog further at dinnertime if readings are over 180 at night  Increase Toujeo by 10 units At least take 20 to 25 minutes for breakfast once he is eating carbohydrates and also at lunch  She will try to increase her vegetables and salads and less starchy meals and eating out at [employer]  She will try to be increasing exercise Consider switching her to U-500 insulin on the next visit if control is still difficult  NEUROPATHY: She will continue 90 mg total of Cymbalta HYPOKALEMIA: Continue potassium supplements and follow-up with PCP  LIPIDS: We will recheck fasting lipids on the next visit   There are no Patient Instructions on file for this visit.        Elayne Snare 12/01/2019, 1:06 PM   Note: This office note was prepared with Dragon voice recognition system technology. Any transcriptional errors that result from this process are unintentional.

## 2019-12-05 DIAGNOSIS — Z83511 Family history of glaucoma: Secondary | ICD-10-CM | POA: Diagnosis not present

## 2019-12-05 DIAGNOSIS — H31013 Macula scars of posterior pole (postinflammatory) (post-traumatic), bilateral: Secondary | ICD-10-CM | POA: Diagnosis not present

## 2019-12-05 DIAGNOSIS — H2513 Age-related nuclear cataract, bilateral: Secondary | ICD-10-CM | POA: Diagnosis not present

## 2019-12-05 DIAGNOSIS — E119 Type 2 diabetes mellitus without complications: Secondary | ICD-10-CM | POA: Diagnosis not present

## 2019-12-06 ENCOUNTER — Other Ambulatory Visit: Payer: Self-pay | Admitting: Endocrinology

## 2019-12-10 DIAGNOSIS — E1142 Type 2 diabetes mellitus with diabetic polyneuropathy: Secondary | ICD-10-CM | POA: Diagnosis not present

## 2019-12-10 DIAGNOSIS — E785 Hyperlipidemia, unspecified: Secondary | ICD-10-CM | POA: Diagnosis not present

## 2019-12-10 DIAGNOSIS — M1990 Unspecified osteoarthritis, unspecified site: Secondary | ICD-10-CM | POA: Diagnosis not present

## 2019-12-10 DIAGNOSIS — N179 Acute kidney failure, unspecified: Secondary | ICD-10-CM | POA: Diagnosis not present

## 2019-12-10 DIAGNOSIS — E119 Type 2 diabetes mellitus without complications: Secondary | ICD-10-CM | POA: Diagnosis not present

## 2019-12-10 DIAGNOSIS — F321 Major depressive disorder, single episode, moderate: Secondary | ICD-10-CM | POA: Diagnosis not present

## 2019-12-10 DIAGNOSIS — M160 Bilateral primary osteoarthritis of hip: Secondary | ICD-10-CM | POA: Diagnosis not present

## 2019-12-10 DIAGNOSIS — I10 Essential (primary) hypertension: Secondary | ICD-10-CM | POA: Diagnosis not present

## 2019-12-10 DIAGNOSIS — I4891 Unspecified atrial fibrillation: Secondary | ICD-10-CM | POA: Diagnosis not present

## 2019-12-10 DIAGNOSIS — E1136 Type 2 diabetes mellitus with diabetic cataract: Secondary | ICD-10-CM | POA: Diagnosis not present

## 2019-12-10 DIAGNOSIS — E1165 Type 2 diabetes mellitus with hyperglycemia: Secondary | ICD-10-CM | POA: Diagnosis not present

## 2019-12-10 DIAGNOSIS — M170 Bilateral primary osteoarthritis of knee: Secondary | ICD-10-CM | POA: Diagnosis not present

## 2019-12-22 ENCOUNTER — Other Ambulatory Visit: Payer: Self-pay | Admitting: Internal Medicine

## 2019-12-27 DIAGNOSIS — E1142 Type 2 diabetes mellitus with diabetic polyneuropathy: Secondary | ICD-10-CM | POA: Diagnosis not present

## 2019-12-27 DIAGNOSIS — I872 Venous insufficiency (chronic) (peripheral): Secondary | ICD-10-CM | POA: Diagnosis not present

## 2019-12-27 DIAGNOSIS — F321 Major depressive disorder, single episode, moderate: Secondary | ICD-10-CM | POA: Diagnosis not present

## 2019-12-27 DIAGNOSIS — E1136 Type 2 diabetes mellitus with diabetic cataract: Secondary | ICD-10-CM | POA: Diagnosis not present

## 2019-12-27 DIAGNOSIS — M1990 Unspecified osteoarthritis, unspecified site: Secondary | ICD-10-CM | POA: Diagnosis not present

## 2019-12-27 DIAGNOSIS — I10 Essential (primary) hypertension: Secondary | ICD-10-CM | POA: Diagnosis not present

## 2019-12-27 DIAGNOSIS — Z0000 Encounter for general adult medical examination without abnormal findings: Secondary | ICD-10-CM | POA: Diagnosis not present

## 2019-12-27 DIAGNOSIS — E1165 Type 2 diabetes mellitus with hyperglycemia: Secondary | ICD-10-CM | POA: Diagnosis not present

## 2019-12-27 DIAGNOSIS — M160 Bilateral primary osteoarthritis of hip: Secondary | ICD-10-CM | POA: Diagnosis not present

## 2019-12-27 DIAGNOSIS — M19031 Primary osteoarthritis, right wrist: Secondary | ICD-10-CM | POA: Diagnosis not present

## 2019-12-27 DIAGNOSIS — E785 Hyperlipidemia, unspecified: Secondary | ICD-10-CM | POA: Diagnosis not present

## 2019-12-27 DIAGNOSIS — I4891 Unspecified atrial fibrillation: Secondary | ICD-10-CM | POA: Diagnosis not present

## 2019-12-29 ENCOUNTER — Other Ambulatory Visit: Payer: Self-pay | Admitting: Endocrinology

## 2019-12-29 ENCOUNTER — Other Ambulatory Visit: Payer: Self-pay | Admitting: Internal Medicine

## 2019-12-29 DIAGNOSIS — I4891 Unspecified atrial fibrillation: Secondary | ICD-10-CM | POA: Diagnosis not present

## 2019-12-29 DIAGNOSIS — E1165 Type 2 diabetes mellitus with hyperglycemia: Secondary | ICD-10-CM | POA: Diagnosis not present

## 2019-12-29 DIAGNOSIS — E785 Hyperlipidemia, unspecified: Secondary | ICD-10-CM | POA: Diagnosis not present

## 2019-12-29 DIAGNOSIS — Z0000 Encounter for general adult medical examination without abnormal findings: Secondary | ICD-10-CM | POA: Diagnosis not present

## 2019-12-29 DIAGNOSIS — M19031 Primary osteoarthritis, right wrist: Secondary | ICD-10-CM | POA: Diagnosis not present

## 2019-12-29 DIAGNOSIS — F321 Major depressive disorder, single episode, moderate: Secondary | ICD-10-CM | POA: Diagnosis not present

## 2019-12-29 DIAGNOSIS — I10 Essential (primary) hypertension: Secondary | ICD-10-CM | POA: Diagnosis not present

## 2019-12-29 DIAGNOSIS — E1142 Type 2 diabetes mellitus with diabetic polyneuropathy: Secondary | ICD-10-CM | POA: Diagnosis not present

## 2019-12-29 DIAGNOSIS — E1136 Type 2 diabetes mellitus with diabetic cataract: Secondary | ICD-10-CM | POA: Diagnosis not present

## 2019-12-29 DIAGNOSIS — M160 Bilateral primary osteoarthritis of hip: Secondary | ICD-10-CM | POA: Diagnosis not present

## 2019-12-29 DIAGNOSIS — M1990 Unspecified osteoarthritis, unspecified site: Secondary | ICD-10-CM | POA: Diagnosis not present

## 2019-12-29 NOTE — Telephone Encounter (Signed)
Prescription refill request for Xarelto received.   Last office visit: 01/14/2019, Melanie Reeves Weight: 137 kg Age: 70 y.o. Scr: 1.14, 11/27/2018 CrCl: 104.99 ml/min   Prescription refill sent.

## 2020-01-09 DIAGNOSIS — M5442 Lumbago with sciatica, left side: Secondary | ICD-10-CM | POA: Diagnosis not present

## 2020-01-09 DIAGNOSIS — M545 Low back pain: Secondary | ICD-10-CM | POA: Diagnosis not present

## 2020-01-09 DIAGNOSIS — Z6841 Body Mass Index (BMI) 40.0 and over, adult: Secondary | ICD-10-CM | POA: Diagnosis not present

## 2020-01-22 ENCOUNTER — Other Ambulatory Visit: Payer: Self-pay | Admitting: Endocrinology

## 2020-02-06 DIAGNOSIS — E1142 Type 2 diabetes mellitus with diabetic polyneuropathy: Secondary | ICD-10-CM | POA: Diagnosis not present

## 2020-02-06 DIAGNOSIS — E1165 Type 2 diabetes mellitus with hyperglycemia: Secondary | ICD-10-CM | POA: Diagnosis not present

## 2020-02-06 DIAGNOSIS — N183 Chronic kidney disease, stage 3 unspecified: Secondary | ICD-10-CM | POA: Diagnosis not present

## 2020-02-06 DIAGNOSIS — F321 Major depressive disorder, single episode, moderate: Secondary | ICD-10-CM | POA: Diagnosis not present

## 2020-02-06 DIAGNOSIS — E1136 Type 2 diabetes mellitus with diabetic cataract: Secondary | ICD-10-CM | POA: Diagnosis not present

## 2020-02-06 DIAGNOSIS — I4891 Unspecified atrial fibrillation: Secondary | ICD-10-CM | POA: Diagnosis not present

## 2020-02-06 DIAGNOSIS — M1990 Unspecified osteoarthritis, unspecified site: Secondary | ICD-10-CM | POA: Diagnosis not present

## 2020-02-06 DIAGNOSIS — N179 Acute kidney failure, unspecified: Secondary | ICD-10-CM | POA: Diagnosis not present

## 2020-02-06 DIAGNOSIS — E119 Type 2 diabetes mellitus without complications: Secondary | ICD-10-CM | POA: Diagnosis not present

## 2020-02-06 DIAGNOSIS — E785 Hyperlipidemia, unspecified: Secondary | ICD-10-CM | POA: Diagnosis not present

## 2020-02-06 DIAGNOSIS — I10 Essential (primary) hypertension: Secondary | ICD-10-CM | POA: Diagnosis not present

## 2020-02-17 ENCOUNTER — Other Ambulatory Visit: Payer: Self-pay

## 2020-02-17 ENCOUNTER — Other Ambulatory Visit: Payer: Self-pay | Admitting: Endocrinology

## 2020-02-17 MED ORDER — TOUJEO MAX SOLOSTAR 300 UNIT/ML ~~LOC~~ SOPN: 140.0000 [IU] | PEN_INJECTOR | Freq: Every day | SUBCUTANEOUS | 1 refills | Status: DC

## 2020-02-17 NOTE — Telephone Encounter (Signed)
Patient requesting refill on Tojeo send to [facility] in [place]-I did start to refill since she has an upcoming appointment and her medication will not last till then but I was given a error-please refill if possible

## 2020-02-28 ENCOUNTER — Other Ambulatory Visit (INDEPENDENT_AMBULATORY_CARE_PROVIDER_SITE_OTHER): Payer: PPO

## 2020-02-28 ENCOUNTER — Other Ambulatory Visit: Payer: Self-pay | Admitting: Endocrinology

## 2020-02-28 ENCOUNTER — Other Ambulatory Visit: Payer: Self-pay

## 2020-02-28 DIAGNOSIS — Z794 Long term (current) use of insulin: Secondary | ICD-10-CM

## 2020-02-28 DIAGNOSIS — E782 Mixed hyperlipidemia: Secondary | ICD-10-CM

## 2020-02-28 DIAGNOSIS — E1165 Type 2 diabetes mellitus with hyperglycemia: Secondary | ICD-10-CM

## 2020-02-28 LAB — BASIC METABOLIC PANEL
BUN: 27 mg/dL — ABNORMAL HIGH (ref 6–23)
CO2: 28 mEq/L (ref 19–32)
Calcium: 9.3 mg/dL (ref 8.4–10.5)
Chloride: 104 mEq/L (ref 96–112)
Creatinine, Ser: 1.05 mg/dL (ref 0.40–1.20)
GFR: 52.31 mL/min — ABNORMAL LOW (ref >60.00)
Glucose, Bld: 110 mg/dL — ABNORMAL HIGH (ref 70–99)
Potassium: 3.8 mEq/L (ref 3.5–5.1)
Sodium: 139 mEq/L (ref 135–145)

## 2020-02-28 LAB — LIPID PANEL
Cholesterol: 134 mg/dL (ref 0–200)
HDL: 32.4 mg/dL — ABNORMAL LOW (ref >39.00)
NonHDL: 102.07
Total CHOL/HDL Ratio: 4
Triglycerides: 227 mg/dL — ABNORMAL HIGH (ref 0.0–149.0)
VLDL: 45.4 mg/dL — ABNORMAL HIGH (ref 0.0–40.0)

## 2020-02-28 LAB — LDL CHOLESTEROL, DIRECT: Direct LDL: 72 mg/dL

## 2020-02-29 LAB — FRUCTOSAMINE: Fructosamine: 205 umol/L (ref 0–285)

## 2020-03-02 ENCOUNTER — Ambulatory Visit: Payer: PPO | Admitting: Endocrinology

## 2020-03-02 ENCOUNTER — Other Ambulatory Visit: Payer: Self-pay

## 2020-03-02 ENCOUNTER — Encounter: Payer: Self-pay | Admitting: Endocrinology

## 2020-03-02 VITALS — BP 118/78 | HR 84 | Temp 98.2°F | Wt 305.4 lb

## 2020-03-02 DIAGNOSIS — E782 Mixed hyperlipidemia: Secondary | ICD-10-CM

## 2020-03-02 DIAGNOSIS — E1165 Type 2 diabetes mellitus with hyperglycemia: Secondary | ICD-10-CM | POA: Diagnosis not present

## 2020-03-02 DIAGNOSIS — Z794 Long term (current) use of insulin: Secondary | ICD-10-CM

## 2020-03-02 LAB — POCT GLYCOSYLATED HEMOGLOBIN (HGB A1C): Hemoglobin A1C: 6.7 % — AB (ref 4.0–5.6)

## 2020-03-02 MED ORDER — TOUJEO MAX SOLOSTAR 300 UNIT/ML ~~LOC~~ SOPN: 140.0000 [IU] | PEN_INJECTOR | Freq: Every day | SUBCUTANEOUS | 1 refills | Status: DC

## 2020-03-02 NOTE — Patient Instructions (Addendum)
20 Novolog in am  Check blood sugars on waking up 5 days a week  Also check blood sugars about 2 hours after meals and do this after different meals by rotation  Recommended blood sugar levels on waking up are 90-130 and about 2 hours after meal is 130-160  Please bring your blood sugar monitor to each visit, thank you

## 2020-03-02 NOTE — Progress Notes (Signed)
Patient ID: Melanie Reeves, female   DOB: 27-Oct-1950, 69 y.o.   MRN: [id number]           Reason for Appointment: Follow-up  for Type 2 Diabetes  Referring physician: Yaakov Guthrie   History of Present Illness:          Date of diagnosis of type 2 diabetes mellitus:  2011       Background history:     She was tested for diabetes when she presented with numbness and tingling in her feet to the health Department. She does not know what her blood sugars were initially  She was however started on both metformin and glipizide at that time. She thinks that in early 2015 she was started on Lantus insulin also developed poor control  Her record indicates A1c levels ranging from 8.2-9.3 and higher at 11.4 in 9/16 She had been started on Victoza in 12/16  Recent history:   INSULIN regimen is: Toujeo 140 U in am, NovoLog 25- 40 AC  Non-insulin hypoglycemic drugs the patient is taking are: Invokana 300 mg,  Metformin ER 2000 mg daily, Victoza 1.36m daily  A1c is 6.7 compared to 8.9 and low normal fructosamine of 205    Current blood sugar patterns and problems identified:  She has made a significant improvement in her diet with following weight watchers for the last 6 weeks or so  She is also trying to cut back on high fat and high carbohydrate meals as well as try to increase more vegetables and fruits  However despite this she has not lost any weight, previously may have gained 20 pounds  She is using some kind of exercise equipment to do some exercise  Her blood sugars are very significantly better  However most of her monitoring is in the mornings and only rarely after meals  No hypoglycemia also despite taking large doses of insulin including as much as 40 units of NovoLog for increased carbohydrate intake  However despite blood sugar being low normal before lunch she is still taking 25 units for low carbohydrate meal in the morning  However more compliant with mealtime  insulin now  She is not having any side effects with Victoza and Invokana   Side effects from medications have been: none  Compliance with the medical regimen: Inadequate  Glucose monitoring:        Glucometer:  One Touch       Blood Glucose readings and averages from meter download:    PRE-MEAL Fasting Lunch Dinner Bedtime Overall  Glucose range: 113-157  99, 100   124, 128 99-162  Mean/median:  130    132   POST-MEAL PC Breakfast PC Lunch PC Dinner  Glucose range:   130   Mean/median:        Self-care: The diet that the patient has been following is: None, not restricting carbohydrates and fats;   eating more starchy foods and bread at times    Meal times: Breakfast:10-11 AM Lunch:4 PM Dinner: 7 pm    Typical meal intake: Breakfast is eggs/ low fat meat..  Lunch.  Evening meal is meat, vegetables,, sometimes [place]               Dietician visit, most recent: Years ago                Weight history: Lowest 178 lb several years ago, more recently 300-330  Wt Readings from Last 3 Encounters:  03/02/20 (!) 305 lb  6.4 oz (138.5 kg)  01/14/19 (!) 302 lb (137 kg)  01/13/19 (!) 302 lb 3.2 oz (137.1 kg)   Glycemic control:   Lab Results  Component Value Date   HGBA1C 6.7 (A) 03/02/2020   HGBA1C 8.9 (H) 11/28/2019   HGBA1C 8.2 (H) 07/22/2019   Lab Results  Component Value Date   MICROALBUR 9.1 (H) 10/29/2018   LDLCALC 66 03/08/2019   CREATININE 1.05 02/28/2020      Office Visit on 03/02/2020  Component Date Value Ref Range Status  . Hemoglobin A1C 03/02/2020 6.7* 4.0 - 5.6 % Final  Lab on 02/28/2020  Component Date Value Ref Range Status  . Cholesterol 02/28/2020 134  0 - 200 mg/dL Final   ATP III Classification       Desirable:  < 200 mg/dL               Borderline High:  200 - 239 mg/dL          High:  > = 240 mg/dL  . Triglycerides 02/28/2020 227.0* 0.0 - 149.0 mg/dL Final   Normal:  <150 mg/dLBorderline High:  150 - 199 mg/dL  . HDL 02/28/2020 32.40*  >39.00 mg/dL Final  . VLDL 02/28/2020 45.4* 0.0 - 40.0 mg/dL Final  . Total CHOL/HDL Ratio 02/28/2020 4   Final                  Men          Women1/2 Average Risk     3.4          3.3Average Risk          5.0          4.42X Average Risk          9.6          7.13X Average Risk          15.0          11.0                      . NonHDL 02/28/2020 102.07   Final   NOTE:  Non-HDL goal should be 30 mg/dL higher than patient's LDL goal (i.e. LDL goal of < 70 mg/dL, would have non-HDL goal of < 100 mg/dL)  . Fructosamine 02/28/2020 205  0 - 285 umol/L Final   Comment: Published reference interval for apparently healthy subjects between age 73 and 27 is 53 - 285 umol/L and in a poorly controlled diabetic population is 228 - 563 umol/L with a mean of 396 umol/L.   Marland Kitchen Sodium 02/28/2020 139  135 - 145 mEq/L Final  . Potassium 02/28/2020 3.8  3.5 - 5.1 mEq/L Final  . Chloride 02/28/2020 104  96 - 112 mEq/L Final  . CO2 02/28/2020 28  19 - 32 mEq/L Final  . Glucose, Bld 02/28/2020 110* 70 - 99 mg/dL Final  . BUN 02/28/2020 27* 6 - 23 mg/dL Final  . Creatinine, Ser 02/28/2020 1.05  0.40 - 1.20 mg/dL Final  . GFR 02/28/2020 52.31* >60.00 mL/min Final  . Calcium 02/28/2020 9.3  8.4 - 10.5 mg/dL Final  . Direct LDL 02/28/2020 72.0  mg/dL Final   Optimal:  <100 mg/dLNear or Above Optimal:  100-129 mg/dLBorderline High:  130-159 mg/dLHigh:  160-189 mg/dLVery High:  >190 mg/dL      Allergies as of 03/02/2020      Reactions   Sulfa Antibiotics Other (See Comments)   Cant remember  Medication List       Accurate as of Mar 02, 2020 11:59 PM. If you have any questions, ask your nurse or doctor.        Accu-Chek FastClix Lancets Misc Use as instructed to test three times daily. DX: E11.65   atenolol 50 MG tablet Commonly known as: TENORMIN TAKE 1 TABLET BY MOUTH TWICE A DAY   B-D ULTRAFINE III SHORT PEN 31G X 8 MM Misc Generic drug: Insulin Pen Needle Use to inject medication 5 times  daily.   diclofenac sodium 1 % Gel Commonly known as: VOLTAREN 2 (two) times daily as needed. Apply to affected area twice daily as needed.   DULoxetine 60 MG capsule Commonly known as: CYMBALTA Take 60 mg by mouth daily. TAKE 1 TABLET BY MOUTH ONCE DAILY.   DULoxetine 30 MG capsule Commonly known as: CYMBALTA TAKE 1 CAPSULE (30 MG TOTAL) BY MOUTH DAILY. TAKE IN ADDITION TO THE 60 MG   fenofibrate 145 MG tablet Commonly known as: TRICOR TAKE 1 TABLET BY MOUTH EVERY DAY   Fish Oil 1000 MG Caps Take 2 capsules by mouth daily.   furosemide 40 MG tablet Commonly known as: LASIX Take 40 mg by mouth daily. Take 1 tablet by mouth once daily.   gabapentin 600 MG tablet Commonly known as: NEURONTIN TAKE 1 TABLET BY MOUTH 3 TIMES A DAY AND 1 TABLET AT BEDTIME   HYDROcodone-acetaminophen 5-325 MG tablet Commonly known as: NORCO/VICODIN every 6 (six) hours as needed.   Invokana 300 MG Tabs tablet Generic drug: canagliflozin TAKE 1 TABLET (300 MG TOTAL) BY MOUTH DAILY BEFORE BREAKFAST.   latanoprost 0.005 % ophthalmic solution Commonly known as: XALATAN Place 1 drop into both eyes at bedtime.   loratadine 10 MG tablet Commonly known as: CLARITIN Take 10 mg by mouth daily.   metFORMIN 500 MG 24 hr tablet Commonly known as: GLUCOPHAGE-XR TAKE 4 TABLETS (2,000 MG TOTAL) BY MOUTH DAILY WITH SUPPER.   NovoLOG FlexPen 100 UNIT/ML FlexPen Generic drug: insulin aspart INJECT 20-35 UNITS UNDER THE SKIN THREE TIMES DAILY BEFORE MEALS.   OneTouch Ultra test strip Generic drug: glucose blood USE AS INSTRUCTED TO CHECK BLOOD SUGAR THREE TIMES DAILY.   OneTouch Verio w/Device Kit UAD to monitor glucose tid; Dx: E11.65   pantoprazole 40 MG tablet Commonly known as: PROTONIX Take 40 mg by mouth daily. Take 1 tablet by mouth once daily.   potassium chloride SA 20 MEQ tablet Commonly known as: KLOR-CON Take 1 tablet (20 mEq total) by mouth daily.   pravastatin 40 MG  tablet Commonly known as: PRAVACHOL TAKE 1 TABLET BY MOUTH EVERY DAY   terazosin 2 MG capsule Commonly known as: HYTRIN Take 2 mg by mouth daily. Take 1 tablet by mouth once daily.   tiZANidine 2 MG tablet Commonly known as: ZANAFLEX Take 2 mg by mouth every 6 (six) hours as needed for muscle spasms.   Toujeo Max SoloStar 300 UNIT/ML Solostar Pen Generic drug: insulin glargine (2 Unit Dial) Inject 140 Units into the skin daily. .   Victoza 18 MG/3ML Sopn Generic drug: liraglutide INJECT 0.3 MLS (1.8 MG TOTAL) INTO THE SKIN DAILY. INJECT ONCE DAILY AT THE SAME TIME   Xarelto 20 MG Tabs tablet Generic drug: rivaroxaban TAKE 1 TABLET (20 MG TOTAL) BY MOUTH DAILY WITH SUPPER.       Allergies:  Allergies  Allergen Reactions  . Sulfa Antibiotics Other (See Comments)    Cant remember    Past Medical  History:  Diagnosis Date  . Abnormal electrocardiogram 08/14/2014   LBBB  . Atrial fibrillation ([employer])   . Diabetes mellitus ([place])   . [place] DEGENERATION 12/20/2009   Qualifier: Diagnosis of  By: Aline Brochure MD, Dorothyann Peng    . Dyspnea 08/14/2014  . Hyperlipidemia   . Hypertension   . LOW BACK PAIN 12/20/2009   Qualifier: Diagnosis of  By: Aline Brochure MD, Dorothyann Peng    . Morbid obesity ([place])   . OSA (obstructive sleep apnea) 08/16/2014  . Seasonal allergies   . SPINAL STENOSIS 12/20/2009   Qualifier: Diagnosis of  By: Aline Brochure MD, Dorothyann Peng      Past Surgical History:  Procedure Laterality Date  . CHOLECYSTECTOMY    . COLONOSCOPY WITH PROPOFOL N/A 11/29/2014   Procedure: COLONOSCOPY WITH PROPOFOL;  Surgeon: Arta Silence, MD;  Location: [facility];  Service: Endoscopy;  Laterality: N/A;  . TONSILLECTOMY      Family History  Problem Relation Age of Onset  . Heart disease Father        MI  . Cancer - Lung Father        Small cell Lung CA    Social History:  reports that she quit smoking about 26 years ago. Her smoking use included cigarettes. She has a 40.00 pack-year smoking  history. She has quit using smokeless tobacco. She reports current alcohol use. She reports that she does not use drugs.    Review of Systems    Lipid history:    Triglycerides have been over 400 at baseline and she is now taking fenofibrate in addition to her pravastatin  She is taking pravastatin 4 days a week; she had intolerance to simvastatin before LDL is below 100 However triglycerides are over 200 as of 5/21    Lab Results  Component Value Date   CHOL 134 02/28/2020   CHOL 111 07/22/2019   CHOL 138 03/08/2019   Lab Results  Component Value Date   HDL 32.40 (L) 02/28/2020   HDL 33.30 (L) 07/22/2019   HDL 33.20 (L) 03/08/2019   Lab Results  Component Value Date   LDLCALC 66 03/08/2019   LDLCALC 104 (H) 09/17/2015   LDLCALC 115 07/02/2015   Lab Results  Component Value Date   TRIG 227.0 (H) 02/28/2020   TRIG 211.0 (H) 07/22/2019   TRIG 194.0 (H) 03/08/2019   Lab Results  Component Value Date   CHOLHDL 4 02/28/2020   CHOLHDL 3 07/22/2019   CHOLHDL 4 03/08/2019   Lab Results  Component Value Date   LDLDIRECT 72.0 02/28/2020   LDLDIRECT 62.0 07/22/2019   LDLDIRECT 83.0 01/11/2019             .  Most recent eye exam was In 9/18   Hypertension:  On treatment from PCP; taking atenolol 50 mg and Hytrin 2 mg Also on Invokana and Lasix  BP Readings from Last 3 Encounters:  03/02/20 118/78  01/14/19 106/60  01/13/19 110/60     NEUROPATHY: Has persistent numbness for in her feet the last several years along with some pain and weakness   She is taking Cymbalta a total of 90 mg a day and 600 mg of gabapentin  Last foot exam in 3/20 done by her PCP, previous findings:  Monofilament sensation is decreased distally on all the toes and distal plantar surfaces    Physical Examination:  BP 118/78 (BP Location: Left Arm, Patient Position: Sitting, Cuff Size: Large)   Pulse 84   Temp 98.2 F (36.8 C) (  Oral)   Wt (!) 305 lb 6.4 oz (138.5 kg)   SpO2 94%    BMI 55.86 kg/m       ASSESSMENT:  Diabetes type 2,  with morbid obesity and long-standing poor control  See history of present illness for detailed discussion of  current management, blood sugar patterns and problems identified  Her A1c is significantly better at 6.7 up at 8.9 compared to 8.2  With her being on weight watchers and cutting back on high carbohydrate high fat meals blood sugars are significantly better all around  Still taking large doses of insulin Also better compliant with NovoLog at mealtimes none   Obesity: Weight gain has leveled off finally Likely may start losing weight especially if insulin dose can be reduced  LIPIDS: Previously reasonably controlled on 2 drugs although tends to have high triglyceride likely to be from poor diabetes control and continued weight gain    PLAN:   She will continue same doses of insulin However discussed needing to check blood sugars after meals to help adjust NovoLog dose better Since she is eating low carbohydrate meals usually she can reduce her morning NovoLog down to 20  Discussed blood sugar targets after meals No change in [place] unless morning sugars are out of range of 90-130   LIPIDS: She still has high triglyceride despite improved diet and blood sugar control Likely may improve further with weight loss, currently already on fish oil, fenofibrate and pravastatin LDL at target  Hypertension: Well-controlled Needs follow-up microalbumin   Patient Instructions  20 Novolog in am  Check blood sugars on waking up 5 days a week  Also check blood sugars about 2 hours after meals and do this after different meals by rotation  Recommended blood sugar levels on waking up are 90-130 and about 2 hours after meal is 130-160  Please bring your blood sugar monitor to each visit, thank you           Elayne Snare 03/03/2020, 3:49 PM   Note: This office note was prepared with Dragon voice recognition  system technology. Any transcriptional errors that result from this process are unintentional.

## 2020-03-12 DIAGNOSIS — I10 Essential (primary) hypertension: Secondary | ICD-10-CM | POA: Diagnosis not present

## 2020-03-12 DIAGNOSIS — F321 Major depressive disorder, single episode, moderate: Secondary | ICD-10-CM | POA: Diagnosis not present

## 2020-03-12 DIAGNOSIS — M160 Bilateral primary osteoarthritis of hip: Secondary | ICD-10-CM | POA: Diagnosis not present

## 2020-03-12 DIAGNOSIS — N183 Chronic kidney disease, stage 3 unspecified: Secondary | ICD-10-CM | POA: Diagnosis not present

## 2020-03-12 DIAGNOSIS — E785 Hyperlipidemia, unspecified: Secondary | ICD-10-CM | POA: Diagnosis not present

## 2020-03-12 DIAGNOSIS — M1990 Unspecified osteoarthritis, unspecified site: Secondary | ICD-10-CM | POA: Diagnosis not present

## 2020-03-12 DIAGNOSIS — E1136 Type 2 diabetes mellitus with diabetic cataract: Secondary | ICD-10-CM | POA: Diagnosis not present

## 2020-03-12 DIAGNOSIS — N179 Acute kidney failure, unspecified: Secondary | ICD-10-CM | POA: Diagnosis not present

## 2020-03-12 DIAGNOSIS — I4891 Unspecified atrial fibrillation: Secondary | ICD-10-CM | POA: Diagnosis not present

## 2020-03-12 DIAGNOSIS — E1165 Type 2 diabetes mellitus with hyperglycemia: Secondary | ICD-10-CM | POA: Diagnosis not present

## 2020-03-12 DIAGNOSIS — M170 Bilateral primary osteoarthritis of knee: Secondary | ICD-10-CM | POA: Diagnosis not present

## 2020-03-12 DIAGNOSIS — E1142 Type 2 diabetes mellitus with diabetic polyneuropathy: Secondary | ICD-10-CM | POA: Diagnosis not present

## 2020-03-23 ENCOUNTER — Other Ambulatory Visit: Payer: Self-pay | Admitting: Endocrinology

## 2020-03-23 ENCOUNTER — Other Ambulatory Visit: Payer: Self-pay | Admitting: Internal Medicine

## 2020-03-26 DIAGNOSIS — I10 Essential (primary) hypertension: Secondary | ICD-10-CM | POA: Diagnosis not present

## 2020-03-26 DIAGNOSIS — F321 Major depressive disorder, single episode, moderate: Secondary | ICD-10-CM | POA: Diagnosis not present

## 2020-03-26 DIAGNOSIS — E1136 Type 2 diabetes mellitus with diabetic cataract: Secondary | ICD-10-CM | POA: Diagnosis not present

## 2020-03-26 DIAGNOSIS — M1990 Unspecified osteoarthritis, unspecified site: Secondary | ICD-10-CM | POA: Diagnosis not present

## 2020-03-26 DIAGNOSIS — M160 Bilateral primary osteoarthritis of hip: Secondary | ICD-10-CM | POA: Diagnosis not present

## 2020-03-26 DIAGNOSIS — N179 Acute kidney failure, unspecified: Secondary | ICD-10-CM | POA: Diagnosis not present

## 2020-03-26 DIAGNOSIS — M170 Bilateral primary osteoarthritis of knee: Secondary | ICD-10-CM | POA: Diagnosis not present

## 2020-03-26 DIAGNOSIS — E1142 Type 2 diabetes mellitus with diabetic polyneuropathy: Secondary | ICD-10-CM | POA: Diagnosis not present

## 2020-03-26 DIAGNOSIS — E785 Hyperlipidemia, unspecified: Secondary | ICD-10-CM | POA: Diagnosis not present

## 2020-03-26 DIAGNOSIS — I4891 Unspecified atrial fibrillation: Secondary | ICD-10-CM | POA: Diagnosis not present

## 2020-03-26 DIAGNOSIS — E119 Type 2 diabetes mellitus without complications: Secondary | ICD-10-CM | POA: Diagnosis not present

## 2020-03-30 ENCOUNTER — Other Ambulatory Visit: Payer: Self-pay | Admitting: Endocrinology

## 2020-04-04 DIAGNOSIS — Z83511 Family history of glaucoma: Secondary | ICD-10-CM | POA: Diagnosis not present

## 2020-04-04 DIAGNOSIS — H40023 Open angle with borderline findings, high risk, bilateral: Secondary | ICD-10-CM | POA: Diagnosis not present

## 2020-04-04 DIAGNOSIS — H31013 Macula scars of posterior pole (postinflammatory) (post-traumatic), bilateral: Secondary | ICD-10-CM | POA: Diagnosis not present

## 2020-04-04 DIAGNOSIS — H2511 Age-related nuclear cataract, right eye: Secondary | ICD-10-CM | POA: Diagnosis not present

## 2020-04-04 DIAGNOSIS — H25812 Combined forms of age-related cataract, left eye: Secondary | ICD-10-CM | POA: Diagnosis not present

## 2020-04-18 ENCOUNTER — Other Ambulatory Visit: Payer: Self-pay | Admitting: Internal Medicine

## 2020-04-25 ENCOUNTER — Other Ambulatory Visit: Payer: Self-pay | Admitting: Endocrinology

## 2020-05-03 ENCOUNTER — Other Ambulatory Visit: Payer: Self-pay | Admitting: Internal Medicine

## 2020-05-08 DIAGNOSIS — E1165 Type 2 diabetes mellitus with hyperglycemia: Secondary | ICD-10-CM | POA: Diagnosis not present

## 2020-05-08 DIAGNOSIS — M160 Bilateral primary osteoarthritis of hip: Secondary | ICD-10-CM | POA: Diagnosis not present

## 2020-05-08 DIAGNOSIS — N183 Chronic kidney disease, stage 3 unspecified: Secondary | ICD-10-CM | POA: Diagnosis not present

## 2020-05-08 DIAGNOSIS — E785 Hyperlipidemia, unspecified: Secondary | ICD-10-CM | POA: Diagnosis not present

## 2020-05-08 DIAGNOSIS — E1142 Type 2 diabetes mellitus with diabetic polyneuropathy: Secondary | ICD-10-CM | POA: Diagnosis not present

## 2020-05-08 DIAGNOSIS — M19031 Primary osteoarthritis, right wrist: Secondary | ICD-10-CM | POA: Diagnosis not present

## 2020-05-08 DIAGNOSIS — F321 Major depressive disorder, single episode, moderate: Secondary | ICD-10-CM | POA: Diagnosis not present

## 2020-05-08 DIAGNOSIS — I4891 Unspecified atrial fibrillation: Secondary | ICD-10-CM | POA: Diagnosis not present

## 2020-05-08 DIAGNOSIS — I10 Essential (primary) hypertension: Secondary | ICD-10-CM | POA: Diagnosis not present

## 2020-05-08 DIAGNOSIS — E1136 Type 2 diabetes mellitus with diabetic cataract: Secondary | ICD-10-CM | POA: Diagnosis not present

## 2020-05-08 DIAGNOSIS — M1990 Unspecified osteoarthritis, unspecified site: Secondary | ICD-10-CM | POA: Diagnosis not present

## 2020-05-13 ENCOUNTER — Other Ambulatory Visit: Payer: Self-pay | Admitting: Endocrinology

## 2020-05-26 ENCOUNTER — Other Ambulatory Visit: Payer: Self-pay | Admitting: Endocrinology

## 2020-05-27 ENCOUNTER — Other Ambulatory Visit: Payer: Self-pay | Admitting: Endocrinology

## 2020-05-29 DIAGNOSIS — M25512 Pain in left shoulder: Secondary | ICD-10-CM | POA: Diagnosis not present

## 2020-05-29 DIAGNOSIS — M67912 Unspecified disorder of synovium and tendon, left shoulder: Secondary | ICD-10-CM | POA: Diagnosis not present

## 2020-05-30 ENCOUNTER — Other Ambulatory Visit: Payer: Self-pay | Admitting: Internal Medicine

## 2020-06-01 ENCOUNTER — Telehealth: Payer: Self-pay | Admitting: Internal Medicine

## 2020-06-01 NOTE — Telephone Encounter (Signed)
*  STAT* If patient is at the pharmacy, call can be transferred to refill team.   1. Which medications need to be refilled? (please list name of each medication and dose if known) atenolol (TENORMIN) 50 MG tablet  2. Which pharmacy/location (including street and city if local pharmacy) is medication to be sent to? [id number] - [place], [place] - [address]  3. Do they need a 30 day or 90 day supply? 30 day supply

## 2020-06-04 MED ORDER — ATENOLOL 50 MG PO TABS
ORAL_TABLET | ORAL | 1 refills | Status: DC
Start: 2020-06-04 — End: 2020-06-25

## 2020-06-04 NOTE — Telephone Encounter (Signed)
Pt's medication was sent to pt's pharmacy as requested. Confirmation received.  °

## 2020-06-05 ENCOUNTER — Telehealth: Payer: Self-pay | Admitting: Internal Medicine

## 2020-06-05 ENCOUNTER — Other Ambulatory Visit: Payer: Self-pay | Admitting: Endocrinology

## 2020-06-05 ENCOUNTER — Other Ambulatory Visit: Payer: PPO

## 2020-06-05 NOTE — Telephone Encounter (Signed)
1. What dental office are you calling from? [facility]   2. What is your office phone number? [phone]  3. What is your fax number? [phone]  4. What type of procedure is the patient having performed? 9 Lower teeth Extractions  5. What date is procedure scheduled or is the patient there now? TBD (if the patient is at the dentist's office question goes to their cardiologist if he/she is in the office.  If not, question should go to the DOD).   6. What is your question (ex. Antibiotics prior to procedure, holding medication-we need to know how long dentist wants pt to hold med)? If antibiotics need to be prescribed prior and if any blood thinners need to be held.

## 2020-06-05 NOTE — Telephone Encounter (Signed)
   Primary Cardiologist:Paula Tenny Craw, MD  Chart reviewed as part of pre-operative protocol coverage. Because of Melanie Reeves's past medical history and time since last visit, they will require a follow-up visit in order to better assess preoperative cardiovascular risk.  The patient hasn't been seen since April 2020. She needs an office visit with Dr Tenny Craw or an APP at [facility] for pre op clearance prior to extensive dental surgery.   Pre-op covering staff: - Please schedule appointment and call patient to inform them. If patient already had an upcoming appointment within acceptable timeframe, please add "pre-op clearance" to the appointment notes so provider is aware. - Please contact requesting surgeon's office via preferred method (i.e, phone, fax) to inform them of need for appointment prior to surgery.  If applicable, this message will also be routed to pharmacy pool and/or primary cardiologist for input on holding anticoagulant/antiplatelet agent as requested below so that this information is available to the clearing provider at time of patient's appointment.   Corine Shelter, PA-C  06/05/2020, 3:11 PM

## 2020-06-06 NOTE — Telephone Encounter (Signed)
Appointment schedule on 09/16 @ 2:45 with Nada Boozer, PA

## 2020-06-08 ENCOUNTER — Other Ambulatory Visit (INDEPENDENT_AMBULATORY_CARE_PROVIDER_SITE_OTHER): Payer: PPO

## 2020-06-08 ENCOUNTER — Other Ambulatory Visit: Payer: Self-pay

## 2020-06-08 ENCOUNTER — Ambulatory Visit: Payer: PPO | Admitting: Endocrinology

## 2020-06-08 DIAGNOSIS — Z794 Long term (current) use of insulin: Secondary | ICD-10-CM | POA: Diagnosis not present

## 2020-06-08 DIAGNOSIS — E1165 Type 2 diabetes mellitus with hyperglycemia: Secondary | ICD-10-CM | POA: Diagnosis not present

## 2020-06-08 LAB — BASIC METABOLIC PANEL
BUN: 35 mg/dL — ABNORMAL HIGH (ref 6–23)
CO2: 25 mEq/L (ref 19–32)
Calcium: 9.5 mg/dL (ref 8.4–10.5)
Chloride: 104 mEq/L (ref 96–112)
Creatinine, Ser: 1.14 mg/dL (ref 0.40–1.20)
GFR: 47.53 mL/min — ABNORMAL LOW (ref >60.00)
Glucose, Bld: 150 mg/dL — ABNORMAL HIGH (ref 70–99)
Potassium: 4.2 mEq/L (ref 3.5–5.1)
Sodium: 140 mEq/L (ref 135–145)

## 2020-06-08 LAB — HEMOGLOBIN A1C: Hgb A1c MFr Bld: 7.3 % — ABNORMAL HIGH (ref 4.6–6.5)

## 2020-06-12 ENCOUNTER — Ambulatory Visit: Payer: PPO | Admitting: Endocrinology

## 2020-06-14 ENCOUNTER — Ambulatory Visit: Payer: PPO | Admitting: Endocrinology

## 2020-06-14 ENCOUNTER — Other Ambulatory Visit: Payer: Self-pay

## 2020-06-14 ENCOUNTER — Ambulatory Visit: Payer: PPO

## 2020-06-14 ENCOUNTER — Encounter: Payer: Self-pay | Admitting: Endocrinology

## 2020-06-14 VITALS — BP 130/70 | HR 86 | Ht 62.0 in | Wt 312.6 lb

## 2020-06-14 DIAGNOSIS — E1165 Type 2 diabetes mellitus with hyperglycemia: Secondary | ICD-10-CM | POA: Diagnosis not present

## 2020-06-14 DIAGNOSIS — E782 Mixed hyperlipidemia: Secondary | ICD-10-CM

## 2020-06-14 DIAGNOSIS — Z794 Long term (current) use of insulin: Secondary | ICD-10-CM | POA: Diagnosis not present

## 2020-06-14 LAB — MICROALBUMIN / CREATININE URINE RATIO
Creatinine,U: 53.9 mg/dL
Microalb Creat Ratio: 4.5 mg/g (ref 0.0–30.0)
Microalb, Ur: 2.4 mg/dL — ABNORMAL HIGH (ref 0.0–1.9)

## 2020-06-14 LAB — POCT GLUCOSE (DEVICE FOR HOME USE): POC Glucose: 205 mg/dl — AB (ref 70–99)

## 2020-06-14 LAB — HM DIABETES EYE EXAM

## 2020-06-14 MED ORDER — FREESTYLE LIBRE 2 READER DEVI: 1.0000 | Freq: Once | 0 refills | Status: DC

## 2020-06-14 MED ORDER — FREESTYLE LIBRE 2 SENSOR MISC: 2.0000 | 3 refills | Status: DC

## 2020-06-14 NOTE — Progress Notes (Signed)
Patient ID: Melanie Reeves, female   DOB: 1951/10/04, 69 y.o.   MRN: [id number]           Reason for Appointment: Follow-up  for Type 2 Diabetes  Referring physician: Yaakov Guthrie   History of Present Illness:          Date of diagnosis of type 2 diabetes mellitus:  2011       Background history:     She was tested for diabetes when she presented with numbness and tingling in her feet to the health Department. She does not know what her blood sugars were initially  She was however started on both metformin and glipizide at that time. She thinks that in early 2015 she was started on Lantus insulin also developed poor control  Her record indicates A1c levels ranging from 8.2-9.3 and higher at 11.4 in 9/16 She had been started on Victoza in 12/16  Recent history:   INSULIN regimen is: Toujeo 140 U in am, NovoLog 20- 40 AC  Non-insulin hypoglycemic drugs the patient is taking are: Invokana 300 mg,  Metformin ER 2000 mg daily, Victoza 1.89m daily  A1c is 7.3, has been down to 6.7 compared to 8.9 previously  Current blood sugar patterns and problems identified:  She has gone off her diet somewhat, previously was doing much better with using weight watchers and losing weight  She forgot her meter today  She says her blood sugar is higher today because she took her NovoLog about half hour or more after her lunch  She does not think she is missing any NovoLog shots at mealtimes especially evening  Even though she is trying to adjust her mealtime dose based on how much she is eating and carbohydrate content she is not measuring readings after meals  Her fasting readings are variable and as high as 175 but she thinks occasionally they may be down to 120  Her weight has gone up   Side effects from medications have been: none  Compliance with the medical regimen: Inadequate  Glucose monitoring:        Glucometer:  One Touch       Blood Glucose readings by recall:   PRE-MEAL  Fasting Lunch Dinner Bedtime Overall  Glucose range: 120-175      Mean/median:     ?   Prior:  PRE-MEAL Fasting Lunch Dinner Bedtime Overall  Glucose range: 113-157  99, 100   124, 128 99-162  Mean/median:  130    132   POST-MEAL PC Breakfast PC Lunch PC Dinner  Glucose range:   130   Mean/median:        Self-care: The diet that the patient has been following is: None, not restricting carbohydrates and fats;   eating more starchy foods and bread at times    Meal times: Breakfast:10-11 AM Lunch:4 PM Dinner: 7 pm    Typical meal intake: Breakfast is eggs/ low fat meat..  Lunch.  Evening meal is meat, vegetables,, sometimes [place]               Dietician visit, most recent: Years ago                Weight history: Lowest 178 lb several years ago, more recently 300-330  Wt Readings from Last 3 Encounters:  06/14/20 (!) 312 lb 9.6 oz (141.8 kg)  03/02/20 (!) 305 lb 6.4 oz (138.5 kg)  01/14/19 (!) 302 lb (137 kg)   Glycemic control:  Lab Results  Component Value Date   HGBA1C 7.3 (H) 06/08/2020   HGBA1C 6.7 (A) 03/02/2020   HGBA1C 8.9 (H) 11/28/2019   Lab Results  Component Value Date   MICROALBUR 9.1 (H) 10/29/2018   LDLCALC 66 03/08/2019   CREATININE 1.14 06/08/2020      Office Visit on 06/14/2020  Component Date Value Ref Range Status  . POC Glucose 06/14/2020 205* 70 - 99 mg/dl Final  Lab on 06/08/2020  Component Date Value Ref Range Status  . Sodium 06/08/2020 140  135 - 145 mEq/L Final  . Potassium 06/08/2020 4.2  3.5 - 5.1 mEq/L Final  . Chloride 06/08/2020 104  96 - 112 mEq/L Final  . CO2 06/08/2020 25  19 - 32 mEq/L Final  . Glucose, Bld 06/08/2020 150* 70 - 99 mg/dL Final  . BUN 06/08/2020 35* 6 - 23 mg/dL Final  . Creatinine, Ser 06/08/2020 1.14  0.40 - 1.20 mg/dL Final  . GFR 06/08/2020 47.53* >60.00 mL/min Final  . Calcium 06/08/2020 9.5  8.4 - 10.5 mg/dL Final  . Hgb A1c MFr Bld 06/08/2020 7.3* 4.6 - 6.5 % Final   Glycemic Control  Guidelines for People with Diabetes:Non Diabetic:  <6%Goal of Therapy: <7%Additional Action Suggested:  >8%       Allergies as of 06/14/2020      Reactions   Sulfa Antibiotics Other (See Comments)   Cant remember      Medication List       Accurate as of June 14, 2020  3:57 PM. If you have any questions, ask your nurse or doctor.        Accu-Chek FastClix Lancets Misc Use as instructed to test three times daily. DX: E11.65   atenolol 50 MG tablet Commonly known as: TENORMIN TAKE 1 TABLET BY MOUTH TWICE A DAY. Please keep upcoming appt in October before anymore refills. Thank you   B-D ULTRAFINE III SHORT PEN 31G X 8 MM Misc Generic drug: Insulin Pen Needle Use to inject medication 5 times daily.   diclofenac sodium 1 % Gel Commonly known as: VOLTAREN 2 (two) times daily as needed. Apply to affected area twice daily as needed.   DULoxetine 60 MG capsule Commonly known as: CYMBALTA Take 60 mg by mouth daily. TAKE 1 TABLET BY MOUTH ONCE DAILY.   DULoxetine 30 MG capsule Commonly known as: CYMBALTA TAKE 1 CAPSULE (30 MG TOTAL) BY MOUTH DAILY. TAKE IN ADDITION TO THE 60 MG   fenofibrate 145 MG tablet Commonly known as: TRICOR TAKE 1 TABLET BY MOUTH EVERY DAY   Fish Oil 1000 MG Caps Take 2 capsules by mouth daily.   furosemide 40 MG tablet Commonly known as: LASIX Take 40 mg by mouth daily. Take 1 tablet by mouth once daily.   gabapentin 600 MG tablet Commonly known as: NEURONTIN TAKE 1 TABLET BY MOUTH 3 TIMES A DAY AND 1 TABLET AT BEDTIME   HYDROcodone-acetaminophen 5-325 MG tablet Commonly known as: NORCO/VICODIN every 6 (six) hours as needed.   Invokana 300 MG Tabs tablet Generic drug: canagliflozin TAKE 1 TABLET (300 MG TOTAL) BY MOUTH DAILY BEFORE BREAKFAST.   latanoprost 0.005 % ophthalmic solution Commonly known as: XALATAN Place 1 drop into both eyes at bedtime.   loratadine 10 MG tablet Commonly known as: CLARITIN Take 10 mg by mouth daily.    metFORMIN 500 MG 24 hr tablet Commonly known as: GLUCOPHAGE-XR TAKE 4 TABLETS (2,000 MG TOTAL) BY MOUTH DAILY WITH SUPPER.   NovoLOG FlexPen 100  UNIT/ML FlexPen Generic drug: insulin aspart INJECT 20-35 UNITS UNDER THE SKIN THREE TIMES DAILY BEFORE MEALS.   OneTouch Ultra test strip Generic drug: glucose blood USE AS INSTRUCTED TO CHECK BLOOD SUGAR THREE TIMES DAILY.   OneTouch Verio w/Device Kit UAD to monitor glucose tid; Dx: E11.65   pantoprazole 40 MG tablet Commonly known as: PROTONIX Take 40 mg by mouth daily. Take 1 tablet by mouth once daily.   potassium chloride SA 20 MEQ tablet Commonly known as: KLOR-CON Take 1 tablet (20 mEq total) by mouth daily.   pravastatin 40 MG tablet Commonly known as: PRAVACHOL TAKE 1 TABLET BY MOUTH EVERY DAY   terazosin 2 MG capsule Commonly known as: HYTRIN Take 2 mg by mouth daily. Take 1 tablet by mouth once daily.   tiZANidine 2 MG tablet Commonly known as: ZANAFLEX Take 2 mg by mouth every 6 (six) hours as needed for muscle spasms.   Toujeo Max SoloStar 300 UNIT/ML Solostar Pen Generic drug: insulin glargine (2 Unit Dial) INJECT 140 UNITS INTO THE SKIN DAILY. .   Victoza 18 MG/3ML Sopn Generic drug: liraglutide INJECT 0.3 MLS (1.8 MG TOTAL) INTO THE SKIN DAILY. INJECT ONCE DAILY AT THE SAME TIME   Xarelto 20 MG Tabs tablet Generic drug: rivaroxaban TAKE 1 TABLET (20 MG TOTAL) BY MOUTH DAILY WITH SUPPER.       Allergies:  Allergies  Allergen Reactions  . Sulfa Antibiotics Other (See Comments)    Cant remember    Past Medical History:  Diagnosis Date  . Abnormal electrocardiogram 08/14/2014   LBBB  . Atrial fibrillation ([place])   . Diabetes mellitus ([place])   . [place] DEGENERATION 12/20/2009   Qualifier: Diagnosis of  By: Aline Brochure MD, Dorothyann Peng    . Dyspnea 08/14/2014  . Hyperlipidemia   . Hypertension   . LOW BACK PAIN 12/20/2009   Qualifier: Diagnosis of  By: Aline Brochure MD, Dorothyann Peng    . Morbid obesity ([place])   .  OSA (obstructive sleep apnea) 08/16/2014  . Seasonal allergies   . SPINAL STENOSIS 12/20/2009   Qualifier: Diagnosis of  By: Aline Brochure MD, Dorothyann Peng      Past Surgical History:  Procedure Laterality Date  . CHOLECYSTECTOMY    . COLONOSCOPY WITH PROPOFOL N/A 11/29/2014   Procedure: COLONOSCOPY WITH PROPOFOL;  Surgeon: Arta Silence, MD;  Location: [facility];  Service: Endoscopy;  Laterality: N/A;  . TONSILLECTOMY      Family History  Problem Relation Age of Onset  . Heart disease Father        MI  . Cancer - Lung Father        Small cell Lung CA    Social History:  reports that she quit smoking about 26 years ago. Her smoking use included cigarettes. She has a 40.00 pack-year smoking history. She has quit using smokeless tobacco. She reports current alcohol use. She reports that she does not use drugs.    Review of Systems    Lipid history:    Triglycerides have been over 400 at baseline and she is taking fenofibrate in addition to her pravastatin  She is taking pravastatin 4 days a week; she had intolerance to simvastatin before LDL is below 100 However triglycerides are over 200 as of 5/21    Lab Results  Component Value Date   CHOL 134 02/28/2020   CHOL 111 07/22/2019   CHOL 138 03/08/2019   Lab Results  Component Value Date   HDL 32.40 (L) 02/28/2020   HDL 33.30 (  L) 07/22/2019   HDL 33.20 (L) 03/08/2019   Lab Results  Component Value Date   LDLCALC 66 03/08/2019   LDLCALC 104 (H) 09/17/2015   LDLCALC 115 07/02/2015   Lab Results  Component Value Date   TRIG 227.0 (H) 02/28/2020   TRIG 211.0 (H) 07/22/2019   TRIG 194.0 (H) 03/08/2019   Lab Results  Component Value Date   CHOLHDL 4 02/28/2020   CHOLHDL 3 07/22/2019   CHOLHDL 4 03/08/2019   Lab Results  Component Value Date   LDLDIRECT 72.0 02/28/2020   LDLDIRECT 62.0 07/22/2019   LDLDIRECT 83.0 01/11/2019            .  Most recent eye exam was In 7/21, report not available   Hypertension:  On  treatment from PCP; taking atenolol 50 mg and Hytrin 2 mg Also on Invokana and Lasix  BP Readings from Last 3 Encounters:  06/14/20 130/70  03/02/20 118/78  01/14/19 106/60     NEUROPATHY: Has persistent numbness for in her feet the last several years along with some pain and weakness   She is taking Cymbalta a total of 90 mg a day and 600 mg of gabapentin  Last foot exam in 7/21 done by her PCP, previous findings:   Monofilament sensation is decreased distally on all the toes and distal plantar surfaces   Physical Examination:  BP 130/70 (BP Location: Left Arm, Patient Position: Sitting, Cuff Size: Large)   Pulse 86   Ht '5\' 2"'  (1.575 m)   Wt (!) 312 lb 9.6 oz (141.8 kg)   SpO2 93%   BMI 57.18 kg/m       ASSESSMENT:  Diabetes type 2,  with morbid obesity and long-standing poor control  See history of present illness for detailed discussion of  current management, blood sugar patterns and problems identified  Her A1c is back up to 7.3  With not following weight watchers as before she has gained weight and her blood sugars are overall higher She is again taking readings mostly in the mornings and these are variable, reportedly as low as 120 She has taken NovoLog regularly but likely not consistent since today she took her injection after eating Still requiring large doses of insulin She has difficulty remembering to monitor after meals  LIPIDS: Previously had high triglycerides despite taking fenofibrate and will need follow-up  Discussed need for Covid vaccine and protection against the disease especially delta variant and reassured her that it is effective and will not have significant side effects.  She says she might consider this  Peripheral neuropathy: She says symptoms are better controlled now  PLAN:   She will continue same doses of insulin for now Discussed that she needs to check readings couple of hours after meals especially dinner so she can verify  that she is taking the correct dose of NovoLog for that meal Take NovoLog before starting to eat She is going to try and go back to Weight Watchers Also she thinks she can start doing more physical activity with a personal trainer that she is going to sign up with  No change in [place] unless morning sugars are consistently over 130 She will also try to get the freestyle libre sensor and if need be she can get help from [place] to set it up Reminded her to use this at least 3-4 times a day when she does start using   Hypertension: Well-controlled Needs follow-up microalbumin today   There are no Patient  Instructions on file for this visit.        Elayne Snare 06/14/2020, 3:57 PM   Note: This office note was prepared with Dragon voice recognition system technology. Any transcriptional errors that result from this process are unintentional.

## 2020-06-14 NOTE — Progress Notes (Signed)
Cardiology Office Note   Date:  06/15/2020   ID:  Melanie Reeves, DOB [birth year], MRN [id number]  PCP:  Vernie Shanks, MD  Cardiologist:   Dorris Carnes, MD    F/U for atrial fbrillation   History of Present Illness: Melanie Reeves is a 69 y.o. female with a history of dypsnea.and CP palpitations.  The pt is referred by Dr Jacelyn Grip for palpitatons   I saw her in November   Complained of intermittent heart racing   UP to 140  Pt set up for event monitor  This showed atrial fibrilliation   The pt still has monitor  Denies dizziness  Does get winded with activity   Occasional chest tightness  I saw the pt in 2018  The pt says her breathing is stable   She gets SOB with no change.   She notes occasional CP that she thinks is triggered by food     Pt is due to have teeth pulled     Current Meds  Medication Sig  . Accu-Chek FastClix Lancets MISC Use as instructed to test three times daily. DX: E11.65  . atenolol (TENORMIN) 50 MG tablet TAKE 1 TABLET BY MOUTH TWICE A DAY. Please keep upcoming appt in October before anymore refills. Thank you  . Blood Glucose Monitoring Suppl (ONETOUCH VERIO) w/Device KIT UAD to monitor glucose tid; Dx: E11.65  . Continuous Blood Gluc Sensor (FREESTYLE LIBRE 2 SENSOR) MISC 2 Devices by Does not apply route every 14 (fourteen) days.  . diclofenac sodium (VOLTAREN) 1 % GEL 2 (two) times daily as needed. Apply to affected area twice daily as needed.  . DULoxetine (CYMBALTA) 30 MG capsule TAKE 1 CAPSULE (30 MG TOTAL) BY MOUTH DAILY. TAKE IN ADDITION TO THE 60 MG  . DULoxetine (CYMBALTA) 60 MG capsule Take 60 mg by mouth daily. TAKE 1 TABLET BY MOUTH ONCE DAILY.  . fenofibrate (TRICOR) 145 MG tablet TAKE 1 TABLET BY MOUTH EVERY DAY  . furosemide (LASIX) 40 MG tablet Take 40 mg by mouth daily. Take 1 tablet by mouth once daily.  Marland Kitchen gabapentin (NEURONTIN) 600 MG tablet TAKE 1 TABLET BY MOUTH 3 TIMES A DAY AND 1 TABLET AT BEDTIME  . HYDROcodone-acetaminophen  (NORCO/VICODIN) 5-325 MG tablet every 6 (six) hours as needed.   . Insulin Pen Needle (B-D ULTRAFINE III SHORT PEN) 31G X 8 MM MISC Use to inject medication 5 times daily.  . INVOKANA 300 MG TABS tablet TAKE 1 TABLET (300 MG TOTAL) BY MOUTH DAILY BEFORE BREAKFAST.  Marland Kitchen latanoprost (XALATAN) 0.005 % ophthalmic solution Place 1 drop into both eyes at bedtime.   Marland Kitchen loratadine (CLARITIN) 10 MG tablet Take 10 mg by mouth daily.  . metFORMIN (GLUCOPHAGE-XR) 500 MG 24 hr tablet TAKE 4 TABLETS (2,000 MG TOTAL) BY MOUTH DAILY WITH SUPPER.  Marland Kitchen NOVOLOG FLEXPEN 100 UNIT/ML FlexPen INJECT 20-35 UNITS UNDER THE SKIN THREE TIMES DAILY BEFORE MEALS.  Marland Kitchen Omega-3 Fatty Acids (FISH OIL) 1000 MG CAPS Take 2 capsules by mouth daily.  Glory Rosebush ULTRA test strip USE AS INSTRUCTED TO CHECK BLOOD SUGAR THREE TIMES DAILY.  . pantoprazole (PROTONIX) 40 MG tablet Take 40 mg by mouth daily. Take 1 tablet by mouth once daily.  . potassium chloride SA (KLOR-CON) 20 MEQ tablet Take 1 tablet (20 mEq total) by mouth daily.  . pravastatin (PRAVACHOL) 40 MG tablet TAKE 1 TABLET BY MOUTH EVERY DAY  . terazosin (HYTRIN) 2 MG capsule Take 2 mg by mouth  daily. Take 1 tablet by mouth once daily.  Marland Kitchen tiZANidine (ZANAFLEX) 2 MG tablet Take 2 mg by mouth every 6 (six) hours as needed for muscle spasms.  . TOUJEO MAX SOLOSTAR 300 UNIT/ML Solostar Pen INJECT 140 UNITS INTO THE SKIN DAILY. .  [name] 18 MG/3ML SOPN INJECT 0.3 MLS (1.8 MG TOTAL) INTO THE SKIN DAILY. INJECT ONCE DAILY AT THE SAME TIME  . XARELTO 20 MG TABS tablet TAKE 1 TABLET (20 MG TOTAL) BY MOUTH DAILY WITH SUPPER.     Allergies:   Sulfa antibiotics   Past Medical History:  Diagnosis Date  . Abnormal electrocardiogram 08/14/2014   LBBB  . Atrial fibrillation ([place])   . Diabetes mellitus ([place])   . [place] DEGENERATION 12/20/2009   Qualifier: Diagnosis of  By: Aline Brochure MD, Dorothyann Peng    . Dyspnea 08/14/2014  . Hyperlipidemia   . Hypertension   . LOW BACK PAIN 12/20/2009    Qualifier: Diagnosis of  By: Aline Brochure MD, Dorothyann Peng    . Morbid obesity ([place])   . OSA (obstructive sleep apnea) 08/16/2014  . Seasonal allergies   . SPINAL STENOSIS 12/20/2009   Qualifier: Diagnosis of  By: Aline Brochure MD, Dorothyann Peng      Past Surgical History:  Procedure Laterality Date  . CHOLECYSTECTOMY    . COLONOSCOPY WITH PROPOFOL N/A 11/29/2014   Procedure: COLONOSCOPY WITH PROPOFOL;  Surgeon: Arta Silence, MD;  Location: [facility];  Service: Endoscopy;  Laterality: N/A;  . TONSILLECTOMY       Social History:  The patient  reports that she quit smoking about 26 years ago. Her smoking use included cigarettes. She has a 40.00 pack-year smoking history. She has quit using smokeless tobacco. She reports current alcohol use. She reports that she does not use drugs.   Family History:  The patient's family history includes Cancer - Lung in her father; Heart disease in her father.    ROS:  Please see the history of present illness. All other systems are reviewed and  Negative to the above problem except as noted.    PHYSICAL EXAM: VS:  BP 122/70   Pulse 83   Ht '5\' 2"'  (1.575 m)   Wt (!) 309 lb (140.2 kg)   SpO2 96%   BMI 56.52 kg/m   GEN:  Morbidly obese 69 yo , in no acute distress  HEENT: normal  Neck: no obvious JVD   Cardiac:RRR S1, S2   Normal ; no murmurs  Triv LE  edema  Respiratory:  clear to auscultation bilaterally, normal work of breathing GI: soft, nontender, nondistended, + BS  No hepatomegaly  MS: no deformity Moving all extremities   Skin: warm and dry, no rash Neuro:  Strength and sensation are intact    EKG:  EKG is  ordered today.  SR 83 bpm   LBBB      Lipid Panel    Component Value Date/Time   CHOL 134 02/28/2020 1124   TRIG 227.0 (H) 02/28/2020 1124   HDL 32.40 (L) 02/28/2020 1124   CHOLHDL 4 02/28/2020 1124   VLDL 45.4 (H) 02/28/2020 1124   LDLCALC 66 03/08/2019 1110   LDLDIRECT 72.0 02/28/2020 1124      Wt Readings from Last 3 Encounters:   06/15/20 (!) 309 lb (140.2 kg)  06/14/20 (!) 312 lb 9.6 oz (141.8 kg)  03/02/20 (!) 305 lb 6.4 oz (138.5 kg)      ASSESSMENT AND PLAN: 1   Atrial fib   Found on event monitor  Pt's CHADS2VASc is 3   Pt denies palpitations   Keep on current regimen    2   Dyspnea  Stable breathing   3  LBBB  No ischemia on myovue in 2018  4   DM   Last A1C 7.3  Discussed diet,  Morbid obesity  As above    PT due to have teeth pulled  OK to hold anticoagulant for a couple days preop  REsume after    Current medicines are reviewed at length with the patient today.  The patient does not have concerns regarding medicines.  Signed, Dorris Carnes, MD  06/15/2020 3:45 PM    [facility] [address], [place], [place]  [address] Phone: [phone]; Fax: [phone]

## 2020-06-14 NOTE — Patient Instructions (Addendum)
Check blood sugars on waking up 4 days a week  Also check blood sugars about 2 hours after meals and do this after different meals by rotation  Recommended blood sugar levels on waking up are 90-130 and about 2 hours after meal is 130-180  Please bring your blood sugar monitor to each visit, thank you  

## 2020-06-15 ENCOUNTER — Encounter: Payer: Self-pay | Admitting: Internal Medicine

## 2020-06-15 ENCOUNTER — Ambulatory Visit: Payer: PPO | Admitting: Internal Medicine

## 2020-06-15 VITALS — BP 122/70 | HR 83 | Ht 62.0 in | Wt 309.0 lb

## 2020-06-15 DIAGNOSIS — I480 Paroxysmal atrial fibrillation: Secondary | ICD-10-CM | POA: Diagnosis not present

## 2020-06-15 NOTE — Patient Instructions (Signed)
Medication Instructions:  No changes *If you need a refill on your cardiac medications before your next appointment, please call your pharmacy*   Lab Work: Cbc today If you have labs (blood work) drawn today and your tests are completely normal, you will receive your results only by:  MyChart Message (if you have MyChart) OR  A paper copy in the mail If you have any lab test that is abnormal or we need to change your treatment, we will call you to review the results.   Testing/Procedures: none   Follow-Up: At [facility], you and your health needs are our priority.  As part of our continuing mission to provide you with exceptional heart care, we have created designated Provider Care Teams.  These Care Teams include your primary Cardiologist (physician) and Advanced Practice Providers (APPs -  Physician Assistants and Nurse Practitioners) who all work together to provide you with the care you need, when you need it.   Your next appointment:   10 month(s)  The format for your next appointment:   In Person  Provider:   You may see Dietrich Pates, MD or one of the following Advanced Practice Providers on your designated Care Team:    Tereso Newcomer, PA-C  Chelsea Aus, [place]    Other Instructions

## 2020-06-16 LAB — CBC
Hematocrit: 39.5 % (ref 34.0–46.6)
Hemoglobin: 12.6 g/dL (ref 11.1–15.9)
MCH: 28.1 pg (ref 26.6–33.0)
MCHC: 31.9 g/dL (ref 31.5–35.7)
MCV: 88 fL (ref 79–97)
Platelets: 260 10*3/uL (ref 150–450)
RBC: 4.49 x10E6/uL (ref 3.77–5.28)
RDW: 14.2 % (ref 11.7–15.4)
WBC: 12.7 10*3/uL — ABNORMAL HIGH (ref 3.4–10.8)

## 2020-06-21 ENCOUNTER — Telehealth: Payer: Self-pay | Admitting: Internal Medicine

## 2020-06-21 NOTE — Telephone Encounter (Signed)
Pt is calling to see if she is medically cleared to have her surgery. Please call

## 2020-06-21 NOTE — Telephone Encounter (Signed)
   Primary Cardiologist: Dietrich Pates, MD  Chart reviewed as part of pre-operative protocol coverage. Given past medical history and time since last visit, based on ACC/AHA guidelines, Melanie Reeves would be at acceptable risk for the planned procedure without further cardiovascular testing.   OK to hold Xarelto two days pre op.  She does not need antibiotics pre op.   The patient was advised that if she develops new symptoms prior to surgery to contact our office to arrange for a follow-up visit, and she verbalized understanding.  I will route this recommendation to the requesting party via Epic fax function and remove from pre-op pool.  Please call with questions.  Corine Shelter, PA-C 06/21/2020, 2:00 PM

## 2020-06-21 NOTE — Telephone Encounter (Signed)
Will route to Pre op Pool.  Pt seen by Dr. Tenny Craw 06/15/20.

## 2020-06-21 NOTE — Progress Notes (Signed)
Faxed per request to Dr. Lennice Sites, DDS at [phone]. MWilson, RN 06/20/20 12:19 pm

## 2020-06-22 ENCOUNTER — Other Ambulatory Visit: Payer: Self-pay | Admitting: Endocrinology

## 2020-06-23 ENCOUNTER — Other Ambulatory Visit: Payer: Self-pay | Admitting: Internal Medicine

## 2020-06-27 ENCOUNTER — Other Ambulatory Visit: Payer: Self-pay | Admitting: Internal Medicine

## 2020-06-27 NOTE — Telephone Encounter (Signed)
Xarelto 20mg  refill request received. Pt is 69 years old, weight-140.2kg, Crea-1.14 on 06/08/2020, last seen by Dr. 06/10/2020 on 06/15/2020, Diagnosis-Afib, CrCl-105.61ml/min; Dose is appropriate based on dosing criteria. Will send in refill to requested pharmacy.

## 2020-06-28 ENCOUNTER — Ambulatory Visit: Payer: PPO | Admitting: Cardiology

## 2020-07-04 NOTE — Telephone Encounter (Signed)
As long as there are no bleeding complications, she can resume taking her Xarelto 24 hours after procedure.

## 2020-07-04 NOTE — Telephone Encounter (Signed)
Melinda from [facility] calling back to find out when the patient needs to be back on her xarelto. She states the patient will be coming in today.

## 2020-07-09 ENCOUNTER — Telehealth: Payer: Self-pay | Admitting: Endocrinology

## 2020-07-09 ENCOUNTER — Encounter: Payer: Self-pay | Admitting: Endocrinology

## 2020-07-09 NOTE — Telephone Encounter (Signed)
Please let her know that the fundus photographs done on 06/14/2020 showed suspected macular degeneration but no diabetic changes.  If she has been told about this diagnosis by her ophthalmologist no further action needed

## 2020-07-09 NOTE — Telephone Encounter (Signed)
Informed patient of results and she will follow up with Ophthalmologist.

## 2020-07-29 ENCOUNTER — Other Ambulatory Visit: Payer: Self-pay | Admitting: Endocrinology

## 2020-08-06 ENCOUNTER — Ambulatory Visit: Payer: PPO | Admitting: Internal Medicine

## 2020-08-06 DIAGNOSIS — S81811A Laceration without foreign body, right lower leg, initial encounter: Secondary | ICD-10-CM | POA: Diagnosis not present

## 2020-08-06 DIAGNOSIS — W540XXA Bitten by dog, initial encounter: Secondary | ICD-10-CM | POA: Diagnosis not present

## 2020-08-08 DIAGNOSIS — W540XXD Bitten by dog, subsequent encounter: Secondary | ICD-10-CM | POA: Diagnosis not present

## 2020-08-08 DIAGNOSIS — S81811D Laceration without foreign body, right lower leg, subsequent encounter: Secondary | ICD-10-CM | POA: Diagnosis not present

## 2020-08-15 DIAGNOSIS — S81811D Laceration without foreign body, right lower leg, subsequent encounter: Secondary | ICD-10-CM | POA: Diagnosis not present

## 2020-08-15 DIAGNOSIS — W540XXD Bitten by dog, subsequent encounter: Secondary | ICD-10-CM | POA: Diagnosis not present

## 2020-08-21 ENCOUNTER — Other Ambulatory Visit: Payer: Self-pay | Admitting: Endocrinology

## 2020-08-22 DIAGNOSIS — S81811D Laceration without foreign body, right lower leg, subsequent encounter: Secondary | ICD-10-CM | POA: Diagnosis not present

## 2020-08-22 DIAGNOSIS — W540XXD Bitten by dog, subsequent encounter: Secondary | ICD-10-CM | POA: Diagnosis not present

## 2020-08-27 ENCOUNTER — Other Ambulatory Visit: Payer: Self-pay | Admitting: Endocrinology

## 2020-08-29 DIAGNOSIS — W540XXD Bitten by dog, subsequent encounter: Secondary | ICD-10-CM | POA: Diagnosis not present

## 2020-08-29 DIAGNOSIS — S81811D Laceration without foreign body, right lower leg, subsequent encounter: Secondary | ICD-10-CM | POA: Diagnosis not present

## 2020-09-10 DIAGNOSIS — E1165 Type 2 diabetes mellitus with hyperglycemia: Secondary | ICD-10-CM | POA: Diagnosis not present

## 2020-09-10 DIAGNOSIS — F321 Major depressive disorder, single episode, moderate: Secondary | ICD-10-CM | POA: Diagnosis not present

## 2020-09-10 DIAGNOSIS — E785 Hyperlipidemia, unspecified: Secondary | ICD-10-CM | POA: Diagnosis not present

## 2020-09-10 DIAGNOSIS — M170 Bilateral primary osteoarthritis of knee: Secondary | ICD-10-CM | POA: Diagnosis not present

## 2020-09-10 DIAGNOSIS — M8580 Other specified disorders of bone density and structure, unspecified site: Secondary | ICD-10-CM | POA: Diagnosis not present

## 2020-09-10 DIAGNOSIS — E1136 Type 2 diabetes mellitus with diabetic cataract: Secondary | ICD-10-CM | POA: Diagnosis not present

## 2020-09-10 DIAGNOSIS — I10 Essential (primary) hypertension: Secondary | ICD-10-CM | POA: Diagnosis not present

## 2020-09-10 DIAGNOSIS — N179 Acute kidney failure, unspecified: Secondary | ICD-10-CM | POA: Diagnosis not present

## 2020-09-10 DIAGNOSIS — E1142 Type 2 diabetes mellitus with diabetic polyneuropathy: Secondary | ICD-10-CM | POA: Diagnosis not present

## 2020-09-10 DIAGNOSIS — M160 Bilateral primary osteoarthritis of hip: Secondary | ICD-10-CM | POA: Diagnosis not present

## 2020-09-10 DIAGNOSIS — I4891 Unspecified atrial fibrillation: Secondary | ICD-10-CM | POA: Diagnosis not present

## 2020-09-10 DIAGNOSIS — N183 Chronic kidney disease, stage 3 unspecified: Secondary | ICD-10-CM | POA: Diagnosis not present

## 2020-09-13 ENCOUNTER — Other Ambulatory Visit: Payer: Self-pay | Admitting: Endocrinology

## 2020-09-15 ENCOUNTER — Other Ambulatory Visit: Payer: Self-pay | Admitting: Endocrinology

## 2020-09-17 ENCOUNTER — Other Ambulatory Visit: Payer: Self-pay

## 2020-09-17 ENCOUNTER — Other Ambulatory Visit: Payer: Self-pay | Admitting: Endocrinology

## 2020-09-17 ENCOUNTER — Other Ambulatory Visit (INDEPENDENT_AMBULATORY_CARE_PROVIDER_SITE_OTHER): Payer: PPO

## 2020-09-17 DIAGNOSIS — E785 Hyperlipidemia, unspecified: Secondary | ICD-10-CM | POA: Diagnosis not present

## 2020-09-17 DIAGNOSIS — Z794 Long term (current) use of insulin: Secondary | ICD-10-CM

## 2020-09-17 DIAGNOSIS — E1122 Type 2 diabetes mellitus with diabetic chronic kidney disease: Secondary | ICD-10-CM | POA: Diagnosis not present

## 2020-09-17 DIAGNOSIS — E1142 Type 2 diabetes mellitus with diabetic polyneuropathy: Secondary | ICD-10-CM | POA: Diagnosis not present

## 2020-09-17 DIAGNOSIS — E782 Mixed hyperlipidemia: Secondary | ICD-10-CM | POA: Diagnosis not present

## 2020-09-17 DIAGNOSIS — N1831 Chronic kidney disease, stage 3a: Secondary | ICD-10-CM | POA: Diagnosis not present

## 2020-09-17 DIAGNOSIS — J309 Allergic rhinitis, unspecified: Secondary | ICD-10-CM | POA: Diagnosis not present

## 2020-09-17 DIAGNOSIS — I4891 Unspecified atrial fibrillation: Secondary | ICD-10-CM | POA: Diagnosis not present

## 2020-09-17 DIAGNOSIS — I872 Venous insufficiency (chronic) (peripheral): Secondary | ICD-10-CM | POA: Diagnosis not present

## 2020-09-17 DIAGNOSIS — E1165 Type 2 diabetes mellitus with hyperglycemia: Secondary | ICD-10-CM | POA: Diagnosis not present

## 2020-09-17 DIAGNOSIS — R2681 Unsteadiness on feet: Secondary | ICD-10-CM | POA: Diagnosis not present

## 2020-09-17 DIAGNOSIS — L97919 Non-pressure chronic ulcer of unspecified part of right lower leg with unspecified severity: Secondary | ICD-10-CM | POA: Diagnosis not present

## 2020-09-17 DIAGNOSIS — M1990 Unspecified osteoarthritis, unspecified site: Secondary | ICD-10-CM | POA: Diagnosis not present

## 2020-09-17 DIAGNOSIS — I10 Essential (primary) hypertension: Secondary | ICD-10-CM | POA: Diagnosis not present

## 2020-09-17 LAB — COMPREHENSIVE METABOLIC PANEL
ALT: 25 U/L (ref 0–35)
AST: 33 U/L (ref 0–37)
Albumin: 4 g/dL (ref 3.5–5.2)
Alkaline Phosphatase: 47 U/L (ref 39–117)
BUN: 26 mg/dL — ABNORMAL HIGH (ref 6–23)
CO2: 25 mEq/L (ref 19–32)
Calcium: 9.6 mg/dL (ref 8.4–10.5)
Chloride: 103 mEq/L (ref 96–112)
Creatinine, Ser: 1.01 mg/dL (ref 0.40–1.20)
GFR: 57.67 mL/min — ABNORMAL LOW (ref >60.00)
Glucose, Bld: 157 mg/dL — ABNORMAL HIGH (ref 70–99)
Potassium: 4.5 mEq/L (ref 3.5–5.1)
Sodium: 138 mEq/L (ref 135–145)
Total Bilirubin: 0.4 mg/dL (ref 0.2–1.2)
Total Protein: 7.8 g/dL (ref 6.0–8.3)

## 2020-09-17 LAB — LIPID PANEL
Cholesterol: 141 mg/dL (ref 0–200)
HDL: 42.4 mg/dL (ref >39.00)
LDL Cholesterol: 65 mg/dL (ref 0–99)
NonHDL: 98.37
Total CHOL/HDL Ratio: 3
Triglycerides: 165 mg/dL — ABNORMAL HIGH (ref 0.0–149.0)
VLDL: 33 mg/dL (ref 0.0–40.0)

## 2020-09-18 LAB — HEMOGLOBIN A1C: Hgb A1c MFr Bld: 7.7 % — ABNORMAL HIGH (ref 4.6–6.5)

## 2020-09-20 ENCOUNTER — Encounter: Payer: Self-pay | Admitting: Endocrinology

## 2020-09-20 ENCOUNTER — Other Ambulatory Visit: Payer: Self-pay

## 2020-09-20 ENCOUNTER — Ambulatory Visit: Payer: PPO | Admitting: Endocrinology

## 2020-09-20 VITALS — BP 132/68 | HR 79 | Ht 63.0 in | Wt 306.1 lb

## 2020-09-20 DIAGNOSIS — E1165 Type 2 diabetes mellitus with hyperglycemia: Secondary | ICD-10-CM

## 2020-09-20 DIAGNOSIS — Z794 Long term (current) use of insulin: Secondary | ICD-10-CM

## 2020-09-20 DIAGNOSIS — E782 Mixed hyperlipidemia: Secondary | ICD-10-CM

## 2020-09-20 NOTE — Patient Instructions (Addendum)
Toujeo 154  Stop potassium

## 2020-09-20 NOTE — Progress Notes (Signed)
Patient ID: Melanie Reeves, female   DOB: 1951-03-06, 69 y.o.   MRN: [id number]           Reason for Appointment: Follow-up  for Type 2 Diabetes  Referring physician: Yaakov Guthrie   History of Present Illness:          Date of diagnosis of type 2 diabetes mellitus:  2011       Background history:     She was tested for diabetes when she presented with numbness and tingling in her feet to the health [facility]. She does not know what her blood sugars were initially  She was however started on both metformin and glipizide at that time. She thinks that in early 2015 she was started on Lantus insulin also developed poor control  Her record indicates A1c levels ranging from 8.2-9.3 and higher at 11.4 in 9/16 She had been started on Victoza in 12/16  Recent history:   INSULIN regimen is: Toujeo 140 U in am, NovoLog 30- 40 AC  Non-insulin hypoglycemic drugs the patient is taking are: Invokana 300 mg,  Metformin ER 2000 mg daily, Victoza 1.71m daily  A1c is 7.7 and progressively higher   Current blood sugar patterns and problems identified:  She has had much higher fasting blood sugars and not clear why  Although she has lost weight recently she is still appears to be requiring large amounts of insulin even with continuing her Invokana and Victoza  She is not able to follow her diet as instructed and generally eating some sweets, not cutting back on high fat foods  She says that she is not able to afford the weight watchers program  Asking for referral to the bariatric medical management  She will increase her NovoLog to 40 or even 45 minutes occasionally with large meals  No recent hypoglycemia despite taking large doses of insulin an average blood sugar at any given time is usually more than 160   Side effects from medications have been: none  Compliance with the medical regimen: Inadequate  Freestyle libre version 2 download data and interpretation as follows   Blood  sugars are mostly averaging around 180 throughout the day with the relatively higher readings late at night  Variability is minimal  Her HYPERGLYCEMIA is occurring periodically late at night after 9 PM occasionally but also later at night also  No hypoglycemia present  Overall postprandial hyperglycemia is not significant most recently since 12/4 data is incomplete  Accuracy of her sensor not evaluated since she had no readings on the date when she had her labs drawn and she has not checked fingersticks at the same time at home usually  CGM use % of time  66  2-week average/SD  180  Time in range      59%  % Time Above 180  35  % Time above 250  6  % Time Below 70 0     PRE-MEAL Fasting Lunch Dinner Bedtime Overall  Glucose range:       Averages:  178  171  160  181    Previous readings:  PRE-MEAL Fasting Lunch Dinner Bedtime Overall  Glucose range: 120-175      Mean/median:     ?     Self-care: The diet that the patient has been following is: None, not restricting carbohydrates and fats;   eating more starchy foods and bread at times    Meal times: Breakfast:10-11 AM Lunch:4 PM Dinner: 7 pm  Typical meal intake: Breakfast is eggs/ low fat meat..  Lunch.  Evening meal is meat, vegetables,, sometimes [place] food               Dietician visit, most recent: Years ago                Weight history: Lowest 178 lb several years ago, more recently 300-330  Wt Readings from Last 3 Encounters:  09/20/20 (!) 306 lb 2 oz (138.9 kg)  06/15/20 (!) 309 lb (140.2 kg)  06/14/20 (!) 312 lb 9.6 oz (141.8 kg)   Glycemic control:   Lab Results  Component Value Date   HGBA1C 7.7 (H) 09/17/2020   HGBA1C 7.3 (H) 06/08/2020   HGBA1C 6.7 (A) 03/02/2020   Lab Results  Component Value Date   MICROALBUR 2.4 (H) 06/14/2020   LDLCALC 65 09/17/2020   CREATININE 1.01 09/17/2020      Lab on 09/17/2020  Component Date Value Ref Range Status   Cholesterol 09/17/2020 141  0 - 200  mg/dL Final   ATP III Classification       Desirable:  < 200 mg/dL               Borderline High:  200 - 239 mg/dL          High:  > = 240 mg/dL   Triglycerides 09/17/2020 165.0* 0.0 - 149.0 mg/dL Final   Normal:  <150 mg/dLBorderline High:  150 - 199 mg/dL   HDL 09/17/2020 42.40  >39.00 mg/dL Final   VLDL 09/17/2020 33.0  0.0 - 40.0 mg/dL Final   LDL Cholesterol 09/17/2020 65  0 - 99 mg/dL Final   Total CHOL/HDL Ratio 09/17/2020 3   Final                  Men          Women1/2 Average Risk     3.4          3.3Average Risk          5.0          4.42X Average Risk          9.6          7.13X Average Risk          15.0          11.0                       NonHDL 09/17/2020 98.37   Final   NOTE:  Non-HDL goal should be 30 mg/dL higher than patient's LDL goal (i.e. LDL goal of < 70 mg/dL, would have non-HDL goal of < 100 mg/dL)   Sodium 09/17/2020 138  135 - 145 mEq/L Final   Potassium 09/17/2020 4.5  3.5 - 5.1 mEq/L Final   Chloride 09/17/2020 103  96 - 112 mEq/L Final   CO2 09/17/2020 25  19 - 32 mEq/L Final   Glucose, Bld 09/17/2020 157* 70 - 99 mg/dL Final   BUN 09/17/2020 26* 6 - 23 mg/dL Final   Creatinine, Ser 09/17/2020 1.01  0.40 - 1.20 mg/dL Final   Total Bilirubin 09/17/2020 0.4  0.2 - 1.2 mg/dL Final   Alkaline Phosphatase 09/17/2020 47  39 - 117 U/L Final   AST 09/17/2020 33  0 - 37 U/L Final   ALT 09/17/2020 25  0 - 35 U/L Final   Total Protein 09/17/2020 7.8  6.0 - 8.3 g/dL Final  Albumin 09/17/2020 4.0  3.5 - 5.2 g/dL Final   GFR 09/17/2020 57.67* >60.00 mL/min Final   Calculated using the CKD-EPI Creatinine Equation (2021)   Calcium 09/17/2020 9.6  8.4 - 10.5 mg/dL Final   Hgb A1c MFr Bld 09/17/2020 7.7* 4.6 - 6.5 % Final   Glycemic Control Guidelines for People with Diabetes:Non Diabetic:  <6%Goal of Therapy: <7%Additional Action Suggested:  >8%       Allergies as of 09/20/2020      Reactions   Sulfa Antibiotics Other (See Comments)   Cant  remember      Medication List       Accurate as of September 20, 2020 11:59 PM. If you have any questions, ask your nurse or doctor.        Accu-Chek FastClix Lancets Misc Use as instructed to test three times daily. DX: E11.65   atenolol 50 MG tablet Commonly known as: TENORMIN TAKE 1 TABLET BY MOUTH TWICE A DAY   B-D ULTRAFINE III SHORT PEN 31G X 8 MM Misc Generic drug: Insulin Pen Needle Use to inject medication 5 times daily.   diclofenac sodium 1 % Gel Commonly known as: VOLTAREN 2 (two) times daily as needed. Apply to affected area twice daily as needed.   DULoxetine 60 MG capsule Commonly known as: CYMBALTA Take 60 mg by mouth daily. TAKE 1 TABLET BY MOUTH ONCE DAILY.   DULoxetine 30 MG capsule Commonly known as: CYMBALTA TAKE 1 CAPSULE (30 MG TOTAL) BY MOUTH DAILY. TAKE IN ADDITION TO THE 60 MG   fenofibrate 145 MG tablet Commonly known as: TRICOR TAKE 1 TABLET BY MOUTH EVERY DAY   Fish Oil 1000 MG Caps Take 2 capsules by mouth daily.   FreeStyle Libre 2 Reader Kerrin Mo USE AS DIRECTED   FreeStyle Libre 2 Sensor Misc APPLY TO SKIN EVERY 14 (FOURTEEN) DAYS AS DIRECTED   furosemide 40 MG tablet Commonly known as: LASIX Take 40 mg by mouth daily. Take 1 tablet by mouth once daily.   gabapentin 600 MG tablet Commonly known as: NEURONTIN TAKE 1 TABLET BY MOUTH 3 TIMES A DAY AND 1 TABLET AT BEDTIME   HYDROcodone-acetaminophen 5-325 MG tablet Commonly known as: NORCO/VICODIN every 6 (six) hours as needed.   Invokana 300 MG Tabs tablet Generic drug: canagliflozin TAKE 1 TABLET (300 MG TOTAL) BY MOUTH DAILY BEFORE BREAKFAST.   latanoprost 0.005 % ophthalmic solution Commonly known as: XALATAN Place 1 drop into both eyes at bedtime.   loratadine 10 MG tablet Commonly known as: CLARITIN Take 10 mg by mouth daily.   metFORMIN 500 MG 24 hr tablet Commonly known as: GLUCOPHAGE-XR TAKE 4 TABLETS (2,000 MG TOTAL) BY MOUTH DAILY WITH SUPPER.   NovoLOG  FlexPen 100 UNIT/ML FlexPen Generic drug: insulin aspart INJECT 20-35 UNITS UNDER THE SKIN THREE TIMES DAILY BEFORE MEALS.   OneTouch Ultra test strip Generic drug: glucose blood USE AS INSTRUCTED TO CHECK BLOOD SUGAR THREE TIMES DAILY.   OneTouch Verio w/Device Kit UAD to monitor glucose tid; Dx: E11.65   pantoprazole 40 MG tablet Commonly known as: PROTONIX Take 40 mg by mouth daily. Take 1 tablet by mouth once daily.   potassium chloride SA 20 MEQ tablet Commonly known as: KLOR-CON Take 1 tablet (20 mEq total) by mouth daily.   pravastatin 40 MG tablet Commonly known as: PRAVACHOL TAKE 1 TABLET BY MOUTH EVERY DAY   terazosin 2 MG capsule Commonly known as: HYTRIN Take 2 mg by mouth daily. Take 1 tablet by  mouth once daily.   tiZANidine 2 MG tablet Commonly known as: ZANAFLEX Take 2 mg by mouth every 6 (six) hours as needed for muscle spasms.   Toujeo Max SoloStar 300 UNIT/ML Solostar Pen Generic drug: insulin glargine (2 Unit Dial) INJECT 140 UNITS INTO THE SKIN DAILY. .   Victoza 18 MG/3ML Sopn Generic drug: liraglutide INJECT 0.3 MLS (1.8 MG TOTAL) INTO THE SKIN DAILY. INJECT ONCE DAILY AT THE SAME TIME   Xarelto 20 MG Tabs tablet Generic drug: rivaroxaban TAKE 1 TABLET (20 MG TOTAL) BY MOUTH DAILY WITH SUPPER.       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   Qualifier: Diagnosis of  By: Aline Brochure MD, [place]     Dyspnea 08/14/2014   Hyperlipidemia    Hypertension    LOW BACK PAIN 12/20/2009   Qualifier: Diagnosis of  By: Aline Brochure MD, Stanley     Morbid obesity ([place])    OSA (obstructive sleep apnea) 08/16/2014   Seasonal allergies    SPINAL STENOSIS 12/20/2009   Qualifier: Diagnosis of  By: Aline Brochure MD,  Dorothyann Peng      Past Surgical History:  Procedure Laterality Date   CHOLECYSTECTOMY     COLONOSCOPY WITH PROPOFOL N/A 11/29/2014   Procedure: COLONOSCOPY WITH PROPOFOL;  Surgeon: Arta Silence, MD;  Location: [facility];  Service: Endoscopy;  Laterality: N/A;   TONSILLECTOMY      Family History  Problem Relation Age of Onset   Heart disease Father        MI   Cancer - Lung Father        Small cell Lung CA    Social History:  reports that she quit smoking about 26 years ago. Her smoking use included cigarettes. She has a 40.00 pack-year smoking history. She has quit using smokeless tobacco. She reports current alcohol use. She reports that she does not use drugs.    Review of Systems    Lipid history:    Triglycerides have been over 400 at baseline and she is taking fenofibrate in addition to her pravastatin  She is taking pravastatin 4 days a week; she had intolerance to simvastatin before LDL is below 100 Triglycerides are now below 200    Lab Results  Component Value Date   CHOL 141 09/17/2020   CHOL 134 02/28/2020   CHOL 111 07/22/2019   Lab Results  Component Value Date   HDL 42.40 09/17/2020   HDL 32.40 (L) 02/28/2020   HDL 33.30 (L) 07/22/2019   Lab Results  Component Value Date   LDLCALC 65 09/17/2020   LDLCALC 66 03/08/2019   LDLCALC 104 (H) 09/17/2015   Lab Results  Component Value Date   TRIG 165.0 (H) 09/17/2020   TRIG 227.0 (H) 02/28/2020   TRIG 211.0 (H) 07/22/2019   Lab Results  Component Value Date   CHOLHDL 3 09/17/2020   CHOLHDL 4 02/28/2020   CHOLHDL 3 07/22/2019   Lab Results  Component Value Date   LDLDIRECT 72.0 02/28/2020   LDLDIRECT 62.0 07/22/2019   LDLDIRECT 83.0 01/11/2019            .  Most recent eye exam was In 7/21, report not available   Hypertension:  On treatment from PCP;  taking atenolol 50 mg and Hytrin 2 mg Also on Invokana   BP Readings from Last 3 Encounters:  09/20/20 132/68  06/15/20 122/70   06/14/20 130/70   Electrolytes: She was previously on Lasix and potassium but she still takes potassium but not Lasix  Lab Results  Component Value Date   K 4.5 09/17/2020     NEUROPATHY: Has persistent numbness for in her feet the last several years along with some pain and weakness   She is taking Cymbalta a total of 90 mg a day and 600 mg of gabapentin  Last foot exam in 7/21 done by her PCP, previous findings:   Monofilament sensation is decreased distally on all the toes and distal plantar surfaces   Physical Examination:  BP 132/68 (BP Location: Left Arm, Patient Position: Sitting, Cuff Size: Normal)    Pulse 79    Ht _0  (1.6 m)    Wt (!) 306 lb 2 oz (138.9 kg)    SpO2 98%    BMI 54.23 kg/m       ASSESSMENT:  Diabetes type 2,  with morbid obesity and long-standing poor control  See history of present illness for detailed discussion of  current management, blood sugar patterns and problems identified  Her A1c is back up to 7.7, previously 7.3  She is highly insulin resistant and is taking well over 200 units of insulin a day especially high basal insulin  With home glucose monitoring using the sensor she has some better idea of her blood sugar patterns especially after meals but periodically has very high readings based on her diet Also fasting readings are very consistently high indicating inadequate basal insulin This is despite taking her Invokana and Victoza  She has taken NovoLog regularly but likely not consistent since today she took her injection after eating Still requiring large doses of insulin She has difficulty remembering to monitor after meals  LIPIDS: Her high triglycerides are somewhat better despite diet being inconsistent although she has lost weight She is taking fenofibrate and will need to continue this along with pravastatin  History of edema: Not present now and she is off Lasix but still taking potassium  Blood pressure well  controlled  PLAN:   She will be referred to the weight management center so that she can be given the meal plan and will followed up regularly May also be a candidate for an anorexic agent although unlikely she can afford Qsymia or Contrave Increase Toujeo by 14 units and adjust weekly based on morning readings to get morning sugars below 130 ideally She will still take 30 to 40 units of NovoLog based on meal size Encouraged her to check her sugars more consistently with using the freestyle libre Also needs to calibrate this from periodic fingerstick readings    Stop potassium supplements   Patient Instructions  Toujeo 154  Stop potassium         Elayne Snare 09/22/2020, 12:45 PM   Note: This office note was prepared with Dragon voice recognition system technology. Any transcriptional errors that result from this process are unintentional.

## 2020-09-24 DIAGNOSIS — L97919 Non-pressure chronic ulcer of unspecified part of right lower leg with unspecified severity: Secondary | ICD-10-CM | POA: Diagnosis not present

## 2020-09-24 DIAGNOSIS — Z794 Long term (current) use of insulin: Secondary | ICD-10-CM | POA: Diagnosis not present

## 2020-09-24 DIAGNOSIS — N1831 Chronic kidney disease, stage 3a: Secondary | ICD-10-CM | POA: Diagnosis not present

## 2020-09-24 DIAGNOSIS — E1122 Type 2 diabetes mellitus with diabetic chronic kidney disease: Secondary | ICD-10-CM | POA: Diagnosis not present

## 2020-10-01 DIAGNOSIS — L97919 Non-pressure chronic ulcer of unspecified part of right lower leg with unspecified severity: Secondary | ICD-10-CM | POA: Diagnosis not present

## 2020-10-03 DIAGNOSIS — E785 Hyperlipidemia, unspecified: Secondary | ICD-10-CM | POA: Diagnosis not present

## 2020-10-03 DIAGNOSIS — N183 Chronic kidney disease, stage 3 unspecified: Secondary | ICD-10-CM | POA: Diagnosis not present

## 2020-10-03 DIAGNOSIS — I4891 Unspecified atrial fibrillation: Secondary | ICD-10-CM | POA: Diagnosis not present

## 2020-10-03 DIAGNOSIS — E1122 Type 2 diabetes mellitus with diabetic chronic kidney disease: Secondary | ICD-10-CM | POA: Diagnosis not present

## 2020-10-03 DIAGNOSIS — M8580 Other specified disorders of bone density and structure, unspecified site: Secondary | ICD-10-CM | POA: Diagnosis not present

## 2020-10-03 DIAGNOSIS — M1990 Unspecified osteoarthritis, unspecified site: Secondary | ICD-10-CM | POA: Diagnosis not present

## 2020-10-03 DIAGNOSIS — I10 Essential (primary) hypertension: Secondary | ICD-10-CM | POA: Diagnosis not present

## 2020-10-03 DIAGNOSIS — F321 Major depressive disorder, single episode, moderate: Secondary | ICD-10-CM | POA: Diagnosis not present

## 2020-10-08 DIAGNOSIS — G4733 Obstructive sleep apnea (adult) (pediatric): Secondary | ICD-10-CM | POA: Diagnosis not present

## 2020-10-08 DIAGNOSIS — I4891 Unspecified atrial fibrillation: Secondary | ICD-10-CM | POA: Diagnosis not present

## 2020-10-08 DIAGNOSIS — Z6841 Body Mass Index (BMI) 40.0 and over, adult: Secondary | ICD-10-CM | POA: Diagnosis not present

## 2020-10-08 DIAGNOSIS — I10 Essential (primary) hypertension: Secondary | ICD-10-CM | POA: Diagnosis not present

## 2020-10-08 DIAGNOSIS — E1142 Type 2 diabetes mellitus with diabetic polyneuropathy: Secondary | ICD-10-CM | POA: Diagnosis not present

## 2020-10-08 DIAGNOSIS — Z8739 Personal history of other diseases of the musculoskeletal system and connective tissue: Secondary | ICD-10-CM | POA: Diagnosis not present

## 2020-10-09 ENCOUNTER — Other Ambulatory Visit: Payer: Self-pay | Admitting: Endocrinology

## 2020-10-18 DIAGNOSIS — J988 Other specified respiratory disorders: Secondary | ICD-10-CM | POA: Diagnosis not present

## 2020-10-19 DIAGNOSIS — U071 COVID-19: Secondary | ICD-10-CM | POA: Diagnosis not present

## 2020-10-19 DIAGNOSIS — J988 Other specified respiratory disorders: Secondary | ICD-10-CM | POA: Diagnosis not present

## 2020-10-22 ENCOUNTER — Encounter (HOSPITAL_COMMUNITY): Payer: Self-pay | Admitting: Emergency Medicine

## 2020-10-22 ENCOUNTER — Other Ambulatory Visit: Payer: Self-pay

## 2020-10-22 DIAGNOSIS — Z79899 Other long term (current) drug therapy: Secondary | ICD-10-CM

## 2020-10-22 DIAGNOSIS — I480 Paroxysmal atrial fibrillation: Secondary | ICD-10-CM | POA: Diagnosis present

## 2020-10-22 DIAGNOSIS — E1142 Type 2 diabetes mellitus with diabetic polyneuropathy: Secondary | ICD-10-CM | POA: Diagnosis not present

## 2020-10-22 DIAGNOSIS — J9600 Acute respiratory failure, unspecified whether with hypoxia or hypercapnia: Secondary | ICD-10-CM | POA: Diagnosis not present

## 2020-10-22 DIAGNOSIS — J9601 Acute respiratory failure with hypoxia: Secondary | ICD-10-CM | POA: Diagnosis present

## 2020-10-22 DIAGNOSIS — E785 Hyperlipidemia, unspecified: Secondary | ICD-10-CM | POA: Diagnosis present

## 2020-10-22 DIAGNOSIS — Z8739 Personal history of other diseases of the musculoskeletal system and connective tissue: Secondary | ICD-10-CM | POA: Diagnosis not present

## 2020-10-22 DIAGNOSIS — Z87891 Personal history of nicotine dependence: Secondary | ICD-10-CM | POA: Diagnosis not present

## 2020-10-22 DIAGNOSIS — Z7984 Long term (current) use of oral hypoglycemic drugs: Secondary | ICD-10-CM

## 2020-10-22 DIAGNOSIS — E1165 Type 2 diabetes mellitus with hyperglycemia: Secondary | ICD-10-CM | POA: Diagnosis present

## 2020-10-22 DIAGNOSIS — F32A Depression, unspecified: Secondary | ICD-10-CM | POA: Diagnosis present

## 2020-10-22 DIAGNOSIS — I10 Essential (primary) hypertension: Secondary | ICD-10-CM | POA: Diagnosis present

## 2020-10-22 DIAGNOSIS — R531 Weakness: Secondary | ICD-10-CM | POA: Diagnosis not present

## 2020-10-22 DIAGNOSIS — Z6841 Body Mass Index (BMI) 40.0 and over, adult: Secondary | ICD-10-CM

## 2020-10-22 DIAGNOSIS — G629 Polyneuropathy, unspecified: Secondary | ICD-10-CM | POA: Diagnosis not present

## 2020-10-22 DIAGNOSIS — R0902 Hypoxemia: Secondary | ICD-10-CM | POA: Diagnosis present

## 2020-10-22 DIAGNOSIS — Z794 Long term (current) use of insulin: Secondary | ICD-10-CM | POA: Diagnosis not present

## 2020-10-22 DIAGNOSIS — J1282 Pneumonia due to coronavirus disease 2019: Secondary | ICD-10-CM | POA: Diagnosis present

## 2020-10-22 DIAGNOSIS — F419 Anxiety disorder, unspecified: Secondary | ICD-10-CM | POA: Diagnosis present

## 2020-10-22 DIAGNOSIS — R059 Cough, unspecified: Secondary | ICD-10-CM | POA: Diagnosis not present

## 2020-10-22 DIAGNOSIS — U071 COVID-19: Secondary | ICD-10-CM | POA: Diagnosis present

## 2020-10-22 DIAGNOSIS — K219 Gastro-esophageal reflux disease without esophagitis: Secondary | ICD-10-CM | POA: Diagnosis present

## 2020-10-22 DIAGNOSIS — Z7901 Long term (current) use of anticoagulants: Secondary | ICD-10-CM

## 2020-10-22 NOTE — ED Triage Notes (Addendum)
Pt to the ED [facility] with covid and weakness. Pt was tested for covid at [facility] and was positive on Friday 10/19/2020.  Pt states she has been unable to eat and drink.  CBG in route was 155 and the pt is wearing a glucose monitor.

## 2020-10-23 ENCOUNTER — Inpatient Hospital Stay (HOSPITAL_COMMUNITY)
Admission: EM | Admit: 2020-10-23 | Discharge: 2020-10-25 | DRG: 177 | Disposition: A | Discharge status: Home Health | Payer: PPO | Attending: Internal Medicine | Admitting: Internal Medicine

## 2020-10-23 ENCOUNTER — Emergency Department (HOSPITAL_COMMUNITY): Payer: PPO

## 2020-10-23 DIAGNOSIS — Z794 Long term (current) use of insulin: Secondary | ICD-10-CM | POA: Diagnosis not present

## 2020-10-23 DIAGNOSIS — R0902 Hypoxemia: Secondary | ICD-10-CM | POA: Diagnosis present

## 2020-10-23 DIAGNOSIS — I480 Paroxysmal atrial fibrillation: Secondary | ICD-10-CM | POA: Diagnosis present

## 2020-10-23 DIAGNOSIS — E1165 Type 2 diabetes mellitus with hyperglycemia: Secondary | ICD-10-CM

## 2020-10-23 DIAGNOSIS — Z7984 Long term (current) use of oral hypoglycemic drugs: Secondary | ICD-10-CM | POA: Diagnosis not present

## 2020-10-23 DIAGNOSIS — J9601 Acute respiratory failure with hypoxia: Secondary | ICD-10-CM | POA: Diagnosis present

## 2020-10-23 DIAGNOSIS — J9600 Acute respiratory failure, unspecified whether with hypoxia or hypercapnia: Secondary | ICD-10-CM

## 2020-10-23 DIAGNOSIS — K219 Gastro-esophageal reflux disease without esophagitis: Secondary | ICD-10-CM | POA: Diagnosis present

## 2020-10-23 DIAGNOSIS — U071 COVID-19: Secondary | ICD-10-CM | POA: Diagnosis present

## 2020-10-23 DIAGNOSIS — F32A Depression, unspecified: Secondary | ICD-10-CM | POA: Diagnosis present

## 2020-10-23 DIAGNOSIS — Z6841 Body Mass Index (BMI) 40.0 and over, adult: Secondary | ICD-10-CM

## 2020-10-23 DIAGNOSIS — Z7901 Long term (current) use of anticoagulants: Secondary | ICD-10-CM | POA: Diagnosis not present

## 2020-10-23 DIAGNOSIS — E785 Hyperlipidemia, unspecified: Secondary | ICD-10-CM | POA: Diagnosis present

## 2020-10-23 DIAGNOSIS — F419 Anxiety disorder, unspecified: Secondary | ICD-10-CM | POA: Diagnosis present

## 2020-10-23 DIAGNOSIS — Z79899 Other long term (current) drug therapy: Secondary | ICD-10-CM | POA: Diagnosis not present

## 2020-10-23 DIAGNOSIS — J1282 Pneumonia due to coronavirus disease 2019: Secondary | ICD-10-CM

## 2020-10-23 DIAGNOSIS — Z87891 Personal history of nicotine dependence: Secondary | ICD-10-CM | POA: Diagnosis not present

## 2020-10-23 DIAGNOSIS — E66813 Obesity, class 3: Secondary | ICD-10-CM

## 2020-10-23 DIAGNOSIS — I10 Essential (primary) hypertension: Secondary | ICD-10-CM | POA: Diagnosis present

## 2020-10-23 LAB — COMPREHENSIVE METABOLIC PANEL
ALT: 38 U/L (ref 0–44)
AST: 62 U/L — ABNORMAL HIGH (ref 15–41)
Albumin: 3.4 g/dL — ABNORMAL LOW (ref 3.5–5.0)
Alkaline Phosphatase: 45 U/L (ref 38–126)
Anion gap: 15 (ref 5–15)
BUN: 16 mg/dL (ref 8–23)
CO2: 20 mmol/L — ABNORMAL LOW (ref 22–32)
Calcium: 8.8 mg/dL — ABNORMAL LOW (ref 8.9–10.3)
Chloride: 99 mmol/L (ref 98–111)
Creatinine, Ser: 0.92 mg/dL (ref 0.44–1.00)
GFR, Estimated: >60 mL/min (ref >60)
Glucose, Bld: 168 mg/dL — ABNORMAL HIGH (ref 70–99)
Potassium: 3.7 mmol/L (ref 3.5–5.1)
Sodium: 134 mmol/L — ABNORMAL LOW (ref 135–145)
Total Bilirubin: 1.1 mg/dL (ref 0.3–1.2)
Total Protein: 8.2 g/dL — ABNORMAL HIGH (ref 6.5–8.1)

## 2020-10-23 LAB — CBC WITH DIFFERENTIAL/PLATELET
Abs Immature Granulocytes: 0.02 10*3/uL (ref 0.00–0.07)
Basophils Absolute: 0 10*3/uL (ref 0.0–0.1)
Basophils Relative: 0 %
Eosinophils Absolute: 0 10*3/uL (ref 0.0–0.5)
Eosinophils Relative: 0 %
HCT: 43 % (ref 36.0–46.0)
Hemoglobin: 13.2 g/dL (ref 12.0–15.0)
Immature Granulocytes: 0 %
Lymphocytes Relative: 16 %
Lymphs Abs: 1.1 10*3/uL (ref 0.7–4.0)
MCH: 28 pg (ref 26.0–34.0)
MCHC: 30.7 g/dL (ref 30.0–36.0)
MCV: 91.3 fL (ref 80.0–100.0)
Monocytes Absolute: 0.7 10*3/uL (ref 0.1–1.0)
Monocytes Relative: 10 %
Neutro Abs: 4.8 10*3/uL (ref 1.7–7.7)
Neutrophils Relative %: 74 %
Platelets: 225 10*3/uL (ref 150–400)
RBC: 4.71 MIL/uL (ref 3.87–5.11)
RDW: 14.6 % (ref 11.5–15.5)
WBC: 6.6 10*3/uL (ref 4.0–10.5)
nRBC: 0 % (ref 0.0–0.2)

## 2020-10-23 LAB — HIV ANTIBODY (ROUTINE TESTING W REFLEX): HIV Screen 4th Generation wRfx: NONREACTIVE

## 2020-10-23 LAB — CBG MONITORING, ED
Glucose-Capillary: 152 mg/dL — ABNORMAL HIGH (ref 70–99)
Glucose-Capillary: 218 mg/dL — ABNORMAL HIGH (ref 70–99)
Glucose-Capillary: 233 mg/dL — ABNORMAL HIGH (ref 70–99)
Glucose-Capillary: 286 mg/dL — ABNORMAL HIGH (ref 70–99)

## 2020-10-23 LAB — PROCALCITONIN: Procalcitonin: <0.1 ng/mL

## 2020-10-23 LAB — LACTIC ACID, PLASMA
Lactic Acid, Venous: 2.3 mmol/L (ref 0.5–1.9)
Lactic Acid, Venous: 1.4 mmol/L (ref 0.5–1.9)

## 2020-10-23 LAB — LACTATE DEHYDROGENASE: LDH: 323 U/L — ABNORMAL HIGH (ref 98–192)

## 2020-10-23 LAB — POC SARS CORONAVIRUS 2 AG -  ED: SARS Coronavirus 2 Ag: POSITIVE — AB

## 2020-10-23 LAB — C-REACTIVE PROTEIN: CRP: 9.4 mg/dL — ABNORMAL HIGH (ref <1.0)

## 2020-10-23 LAB — FERRITIN: Ferritin: 249 ng/mL (ref 11–307)

## 2020-10-23 LAB — TRIGLYCERIDES: Triglycerides: 127 mg/dL (ref <150)

## 2020-10-23 LAB — D-DIMER, QUANTITATIVE: D-Dimer, Quant: 0.94 ug/mL-FEU — ABNORMAL HIGH (ref 0.00–0.50)

## 2020-10-23 LAB — FIBRINOGEN: Fibrinogen: 628 mg/dL — ABNORMAL HIGH (ref 210–475)

## 2020-10-23 MED ORDER — IPRATROPIUM-ALBUTEROL 20-100 MCG/ACT IN AERS
1.0000 | INHALATION_SPRAY | Freq: Four times a day (QID) | RESPIRATORY_TRACT | Status: DC
  Administered 2020-10-23 (×3): 1 via RESPIRATORY_TRACT

## 2020-10-23 MED ORDER — ALBUTEROL SULFATE HFA 108 (90 BASE) MCG/ACT IN AERS: 2.0000 | INHALATION_SPRAY | RESPIRATORY_TRACT | Status: DC | PRN

## 2020-10-23 MED ORDER — INSULIN DETEMIR 100 UNIT/ML ~~LOC~~ SOLN
15.0000 [IU] | Freq: Two times a day (BID) | SUBCUTANEOUS | Status: DC
  Administered 2020-10-23 (×2): 15 [IU] via SUBCUTANEOUS
  Filled 2020-10-23 (×3): qty 0.15

## 2020-10-23 MED ORDER — ACETAMINOPHEN 325 MG PO TABS
650.0000 mg | ORAL_TABLET | Freq: Four times a day (QID) | ORAL | Status: DC | PRN
  Administered 2020-10-23: 650 mg via ORAL
  Filled 2020-10-23: qty 2

## 2020-10-23 MED ORDER — PRAVASTATIN SODIUM 40 MG PO TABS
40.0000 mg | ORAL_TABLET | Freq: Every day | ORAL | Status: DC
  Administered 2020-10-23 – 2020-10-25 (×3): 40 mg via ORAL
  Filled 2020-10-23 (×3): qty 1

## 2020-10-23 MED ORDER — DEXAMETHASONE SODIUM PHOSPHATE 10 MG/ML IJ SOLN: 6.0000 mg | Freq: Once | INTRAMUSCULAR | Status: DC

## 2020-10-23 MED ORDER — INSULIN ASPART 100 UNIT/ML ~~LOC~~ SOLN
0.0000 [IU] | Freq: Every day | SUBCUTANEOUS | Status: DC
  Administered 2020-10-23: 3 [IU] via SUBCUTANEOUS
  Administered 2020-10-24: 5 [IU] via SUBCUTANEOUS
  Filled 2020-10-23: qty 1

## 2020-10-23 MED ORDER — SODIUM CHLORIDE 0.9 % IV SOLN: 200.0000 mg | Freq: Once | INTRAVENOUS | Status: DC

## 2020-10-23 MED ORDER — INSULIN ASPART 100 UNIT/ML ~~LOC~~ SOLN
0.0000 [IU] | Freq: Three times a day (TID) | SUBCUTANEOUS | Status: DC
  Administered 2020-10-23 (×2): 7 [IU] via SUBCUTANEOUS
  Administered 2020-10-24 (×2): 15 [IU] via SUBCUTANEOUS
  Administered 2020-10-24: 7 [IU] via SUBCUTANEOUS
  Administered 2020-10-25: 20 [IU] via SUBCUTANEOUS
  Administered 2020-10-25: 11 [IU] via SUBCUTANEOUS
  Filled 2020-10-23 (×5): qty 1

## 2020-10-23 MED ORDER — FENOFIBRATE 160 MG PO TABS
160.0000 mg | ORAL_TABLET | Freq: Every day | ORAL | Status: DC
  Administered 2020-10-23 – 2020-10-25 (×3): 160 mg via ORAL
  Filled 2020-10-23 (×3): qty 1

## 2020-10-23 MED ORDER — INSULIN ASPART 100 UNIT/ML ~~LOC~~ SOLN
3.0000 [IU] | Freq: Three times a day (TID) | SUBCUTANEOUS | Status: DC
  Administered 2020-10-23 (×2): 3 [IU] via SUBCUTANEOUS
  Filled 2020-10-23 (×2): qty 1

## 2020-10-23 MED ORDER — IPRATROPIUM-ALBUTEROL 20-100 MCG/ACT IN AERS
1.0000 | INHALATION_SPRAY | Freq: Three times a day (TID) | RESPIRATORY_TRACT | Status: DC
  Administered 2020-10-24 – 2020-10-25 (×5): 1 via RESPIRATORY_TRACT

## 2020-10-23 MED ORDER — GUAIFENESIN-DM 100-10 MG/5ML PO SYRP: 10.0000 mL | ORAL_SOLUTION | ORAL | Status: DC | PRN

## 2020-10-23 MED ORDER — RIVAROXABAN 20 MG PO TABS
20.0000 mg | ORAL_TABLET | Freq: Every day | ORAL | Status: DC
  Administered 2020-10-23 – 2020-10-25 (×3): 20 mg via ORAL
  Filled 2020-10-23 (×3): qty 1

## 2020-10-23 MED ORDER — HYDROCOD POLST-CPM POLST ER 10-8 MG/5ML PO SUER: 5.0000 mL | Freq: Two times a day (BID) | ORAL | Status: DC | PRN

## 2020-10-23 MED ORDER — ATENOLOL 25 MG PO TABS
50.0000 mg | ORAL_TABLET | Freq: Two times a day (BID) | ORAL | Status: DC
  Administered 2020-10-23 (×2): 50 mg via ORAL
  Filled 2020-10-23 (×2): qty 2

## 2020-10-23 MED ORDER — SODIUM CHLORIDE 0.9 % IV SOLN: 100.0000 mg | Freq: Every day | INTRAVENOUS | Status: DC

## 2020-10-23 MED ORDER — GABAPENTIN 300 MG PO CAPS
600.0000 mg | ORAL_CAPSULE | Freq: Three times a day (TID) | ORAL | Status: DC
  Administered 2020-10-23 – 2020-10-25 (×6): 600 mg via ORAL
  Filled 2020-10-23 (×6): qty 2

## 2020-10-23 MED ORDER — SODIUM CHLORIDE 0.9 % IV SOLN
100.0000 mg | INTRAVENOUS | Status: AC
  Administered 2020-10-23 (×2): 100 mg via INTRAVENOUS
  Filled 2020-10-23 (×2): qty 20

## 2020-10-23 MED ORDER — METHYLPREDNISOLONE SODIUM SUCC 125 MG IJ SOLR
125.0000 mg | Freq: Once | INTRAMUSCULAR | Status: AC
  Administered 2020-10-23: 125 mg via INTRAVENOUS
  Filled 2020-10-23: qty 2

## 2020-10-23 MED ORDER — TERAZOSIN HCL 1 MG PO CAPS
2.0000 mg | ORAL_CAPSULE | Freq: Every day | ORAL | Status: DC
Start: 2020-10-23 — End: 2020-10-24
  Administered 2020-10-23: 2 mg via ORAL
  Filled 2020-10-23 (×2): qty 1

## 2020-10-23 MED ORDER — PANTOPRAZOLE SODIUM 40 MG PO TBEC
40.0000 mg | DELAYED_RELEASE_TABLET | Freq: Every day | ORAL | Status: DC
Start: 2020-10-23 — End: 2020-10-25
  Administered 2020-10-23 – 2020-10-25 (×3): 40 mg via ORAL
  Filled 2020-10-23 (×3): qty 1

## 2020-10-23 MED ORDER — ENOXAPARIN SODIUM 40 MG/0.4ML ~~LOC~~ SOLN
40.0000 mg | SUBCUTANEOUS | Status: DC
Start: 2020-10-23 — End: 2020-10-23

## 2020-10-23 MED ORDER — PREDNISONE 20 MG PO TABS: 50.0000 mg | ORAL_TABLET | Freq: Every day | ORAL | Status: DC

## 2020-10-23 MED ORDER — IPRATROPIUM-ALBUTEROL 20-100 MCG/ACT IN AERS
INHALATION_SPRAY | RESPIRATORY_TRACT | Status: AC
  Filled 2020-10-23: qty 4

## 2020-10-23 MED ORDER — LORATADINE 10 MG PO TABS
10.0000 mg | ORAL_TABLET | Freq: Every day | ORAL | Status: DC
  Administered 2020-10-23 – 2020-10-25 (×3): 10 mg via ORAL
  Filled 2020-10-23 (×3): qty 1

## 2020-10-23 MED ORDER — ZINC SULFATE 220 (50 ZN) MG PO CAPS
220.0000 mg | ORAL_CAPSULE | Freq: Every day | ORAL | Status: DC
  Administered 2020-10-23 – 2020-10-25 (×3): 220 mg via ORAL
  Filled 2020-10-23 (×3): qty 1

## 2020-10-23 MED ORDER — SODIUM CHLORIDE 0.9 % IV SOLN
100.0000 mg | Freq: Every day | INTRAVENOUS | Status: DC
  Administered 2020-10-24 – 2020-10-25 (×2): 100 mg via INTRAVENOUS
  Filled 2020-10-23 (×2): qty 20

## 2020-10-23 MED ORDER — SODIUM CHLORIDE 0.9 % IV SOLN: INTRAVENOUS | Status: AC

## 2020-10-23 MED ORDER — ONDANSETRON HCL 4 MG/2ML IJ SOLN: 4.0000 mg | Freq: Four times a day (QID) | INTRAMUSCULAR | Status: DC | PRN

## 2020-10-23 MED ORDER — DULOXETINE HCL 60 MG PO CPEP
60.0000 mg | ORAL_CAPSULE | Freq: Every day | ORAL | Status: DC
Start: 2020-10-23 — End: 2020-10-25
  Administered 2020-10-23 – 2020-10-25 (×3): 60 mg via ORAL
  Filled 2020-10-23: qty 1
  Filled 2020-10-23 (×2): qty 2

## 2020-10-23 MED ORDER — ONDANSETRON HCL 4 MG PO TABS: 4.0000 mg | ORAL_TABLET | Freq: Four times a day (QID) | ORAL | Status: DC | PRN

## 2020-10-23 MED ORDER — ASCORBIC ACID 500 MG PO TABS
500.0000 mg | ORAL_TABLET | Freq: Every day | ORAL | Status: DC
  Administered 2020-10-23 – 2020-10-25 (×3): 500 mg via ORAL
  Filled 2020-10-23 (×3): qty 1

## 2020-10-23 MED ORDER — METHYLPREDNISOLONE SODIUM SUCC 1000 MG IJ SOLR
1.0000 mg/kg | Freq: Two times a day (BID) | INTRAMUSCULAR | Status: DC
  Administered 2020-10-23 – 2020-10-25 (×4): 140 mg via INTRAVENOUS
  Filled 2020-10-23 (×6): qty 1.12

## 2020-10-23 NOTE — ED Notes (Signed)
Pt ambulated in room on pulse ox, lowest 02 92% room air, highest HR 132. Dr. Bebe Shaggy made aware

## 2020-10-23 NOTE — ED Notes (Signed)
Date and time results received: 10/23/20 0512 (use smartphrase ".now" to insert current time)  Test: lactic acid Critical Value: 2.3  Name of Provider Notified: Dr Bebe Shaggy  Orders Received? Or Actions Taken?: Actions Taken: no orders received

## 2020-10-23 NOTE — H&P (Signed)
History and Physical    Molley Houser Beharry WJX:[id number] DOB: [birth year] DOA: 10/23/2020  PCP: Vernie Shanks, MD   Patient coming from: home   I have personally briefly reviewed patient's old medical records in [place]  Chief Complaint: Shortness of breath, chills, general malaise, nonproductive coughing spells.  HPI: Melanie Reeves is a 70 y.o. female with medical history significant of hypertension, paroxysmal atrial fibrillation, type 2 diabetes, morbid obesity, hyperlipidemia, seasonal allergies and depression/anxiety; who presented to the hospital secondary to general malaise, shortness of breath, chills, fevers, nonproductive cough and difficulty breathing.  Patient is present and has been present for the last 3-4 days and worsening; on January 7 she tested positive for COVID, but at that time was no demonstrating hypoxia.  Today she fell short of breath even at rest and express having difficulty speaking in full sentences.  She presented to the emergency department for further evaluation and management. Patient denies chest pain, nausea/vomiting, dysuria, hematuria, melena, hematochezia, focal weakness or any other complaints.  Of note, patient is not vaccinated.  ED Course: In the ED patient was found to be slightly hypoxic 88-92 on room air; D-dimer 0.94, ferritin 249, LDH 323, CRP 9.4.  Chest x-ray demonstrated bilateral infiltrates compatible with COVID infection.  IV steroids and remdesivir initiated; 2 L nasal cannula supplementation provided and TRH contacted to place patient in the hospital for further evaluation and management.  Review of Systems: As per HPI otherwise all other systems reviewed and are negative.   Past Medical History:  Diagnosis Date  . Abnormal electrocardiogram 08/14/2014   LBBB  . Atrial fibrillation ([place])   . Diabetes mellitus ([place])   . [place] DEGENERATION 12/20/2009   Qualifier: Diagnosis of  By: Aline Brochure MD, Dorothyann Peng    . Dyspnea 08/14/2014  .  Hyperlipidemia   . Hypertension   . LOW BACK PAIN 12/20/2009   Qualifier: Diagnosis of  By: Aline Brochure MD, Dorothyann Peng    . Morbid obesity ([place])   . OSA (obstructive sleep apnea) 08/16/2014  . Seasonal allergies   . SPINAL STENOSIS 12/20/2009   Qualifier: Diagnosis of  By: Aline Brochure MD, Dorothyann Peng      Past Surgical History:  Procedure Laterality Date  . CHOLECYSTECTOMY    . COLONOSCOPY WITH PROPOFOL N/A 11/29/2014   Procedure: COLONOSCOPY WITH PROPOFOL;  Surgeon: Arta Silence, MD;  Location: [facility];  Service: Endoscopy;  Laterality: N/A;  . TONSILLECTOMY      Social History  reports that she quit smoking about 26 years ago. Her smoking use included cigarettes. She has a 40.00 pack-year smoking history. She has quit using smokeless tobacco. She reports current alcohol use. She reports that she does not use drugs.  Allergies  Allergen Reactions  . Sulfa Antibiotics Other (See Comments)    Cant remember    Family History  Problem Relation Age of Onset  . Heart disease Father        MI  . Cancer - Lung Father        Small cell Lung CA    Prior to Admission medications   Medication Sig Start Date End Date Taking? Authorizing Provider  Accu-Chek FastClix Lancets MISC Use as instructed to test three times daily. DX: E11.65 01/04/19   Elayne Snare, MD  atenolol (TENORMIN) 50 MG tablet TAKE 1 TABLET BY MOUTH TWICE A DAY 06/25/20   Fay Records, MD  Blood Glucose Monitoring Suppl (ONETOUCH VERIO) w/Device KIT UAD to monitor glucose tid; Dx: E11.65 07/09/18  Elayne Snare, MD  Continuous Blood Gluc Receiver (FREESTYLE LIBRE 2 READER) DEVI USE AS DIRECTED 09/13/20   Elayne Snare, MD  Continuous Blood Gluc Sensor (FREESTYLE LIBRE 2 SENSOR) MISC APPLY TO SKIN EVERY 14 (FOURTEEN) DAYS AS DIRECTED 09/16/20   Elayne Snare, MD  diclofenac sodium (VOLTAREN) 1 % GEL 2 (two) times daily as needed. Apply to affected area twice daily as needed. 07/13/19   [provider]  DULoxetine (CYMBALTA) 30 MG  capsule TAKE 1 CAPSULE (30 MG TOTAL) BY MOUTH DAILY. TAKE IN ADDITION TO THE 60 MG 08/21/20   Elayne Snare, MD  DULoxetine (CYMBALTA) 60 MG capsule Take 60 mg by mouth daily. TAKE 1 TABLET BY MOUTH ONCE DAILY.    [provider]  fenofibrate (TRICOR) 145 MG tablet TAKE 1 TABLET BY MOUTH EVERY DAY 05/27/20   Elayne Snare, MD  furosemide (LASIX) 40 MG tablet Take 40 mg by mouth daily. Take 1 tablet by mouth once daily. Patient not taking: Reported on 09/20/2020 05/10/19   [provider]  gabapentin (NEURONTIN) 600 MG tablet TAKE 1 TABLET BY MOUTH 3 TIMES A DAY AND 1 TABLET AT BEDTIME 10/09/20   Elayne Snare, MD  HYDROcodone-acetaminophen (NORCO/VICODIN) 5-325 MG tablet every 6 (six) hours as needed.  10/09/15   [provider]  Insulin Pen Needle (B-D ULTRAFINE III SHORT PEN) 31G X 8 MM MISC Use to inject medication 5 times daily. 09/02/19   Elayne Snare, MD  INVOKANA 300 MG TABS tablet TAKE 1 TABLET (300 MG TOTAL) BY MOUTH DAILY BEFORE BREAKFAST. 09/17/20   Elayne Snare, MD  latanoprost (XALATAN) 0.005 % ophthalmic solution Place 1 drop into both eyes at bedtime.  Patient not taking: Reported on 09/20/2020    [provider]  loratadine (CLARITIN) 10 MG tablet Take 10 mg by mouth daily.    [provider]  metFORMIN (GLUCOPHAGE-XR) 500 MG 24 hr tablet TAKE 4 TABLETS (2,000 MG TOTAL) BY MOUTH DAILY WITH SUPPER. 08/27/20   Elayne Snare, MD  NOVOLOG FLEXPEN 100 UNIT/ML FlexPen INJECT 20-35 UNITS UNDER THE SKIN THREE TIMES DAILY BEFORE MEALS. 10/09/20   Elayne Snare, MD  Omega-3 Fatty Acids (FISH OIL) 1000 MG CAPS Take 2 capsules by mouth daily.    [provider]  ONETOUCH ULTRA test strip USE AS INSTRUCTED TO CHECK BLOOD SUGAR THREE TIMES DAILY. 10/09/19   Elayne Snare, MD  pantoprazole (PROTONIX) 40 MG tablet Take 40 mg by mouth daily. Take 1 tablet by mouth once daily. Patient not taking: Reported on 09/20/2020 06/09/19   [provider]  potassium  chloride SA (KLOR-CON) 20 MEQ tablet Take 1 tablet (20 mEq total) by mouth daily. 07/26/19   Elayne Snare, MD  pravastatin (PRAVACHOL) 40 MG tablet TAKE 1 TABLET BY MOUTH EVERY DAY 08/27/20   Elayne Snare, MD  terazosin (HYTRIN) 2 MG capsule Take 2 mg by mouth daily. Take 1 tablet by mouth once daily. 06/15/19   [provider]  tiZANidine (ZANAFLEX) 2 MG tablet Take 2 mg by mouth every 6 (six) hours as needed for muscle spasms.    [provider]  TOUJEO MAX SOLOSTAR 300 UNIT/ML Solostar Pen INJECT 140 UNITS INTO THE SKIN DAILY. . 06/05/20   Elayne Snare, MD  VICTOZA 18 MG/3ML SOPN INJECT 0.3 MLS (1.8 MG TOTAL) INTO THE SKIN DAILY. INJECT ONCE DAILY AT THE SAME TIME 04/26/20   Elayne Snare, MD  XARELTO 20 MG TABS tablet TAKE 1 TABLET (20 MG TOTAL) BY MOUTH DAILY WITH  SUPPER. 06/27/20   Fay Records, MD    Physical Exam: Vitals:   10/23/20 1352 10/23/20 1400 10/23/20 1446 10/23/20 1500  BP:  (!) 102/47  (!) 102/49  Pulse:  68  64  Resp:  18  19  Temp:   98.5 F (36.9 C)   TempSrc:   Oral   SpO2: 94% 95%  95%  Weight:      Height:        Constitutional: No chest pain, mild difficulty speaking in full sentences secondary to shortness of breath.  No nausea or vomiting reported.  Patient is morbidly obese in appearance. Vitals:   10/23/20 1352 10/23/20 1400 10/23/20 1446 10/23/20 1500  BP:  (!) 102/47  (!) 102/49  Pulse:  68  64  Resp:  18  19  Temp:   98.5 F (36.9 C)   TempSrc:   Oral   SpO2: 94% 95%  95%  Weight:      Height:       Eyes: PERRL, lids and conjunctivae normal; no icterus. ENMT: Mucous membranes are moist. Posterior pharynx clear of any exudate or lesions.Normal dentition.  Neck: normal, supple, no masses, no thyromegaly; unable to properly assess JVD with body habitus. Respiratory: Positive tachypnea, rhonchi bilaterally appreciated; no using accessory muscle.  2 L nasal cannula in place. Cardiovascular: Regular rate; no rubs, no gallops, unchanged 1-2+  edema bilaterally. Abdomen: Obese, no tenderness, no masses palpated. No hepatosplenomegaly. Bowel sounds positive.  Musculoskeletal: no clubbing / cyanosis. No joint deformity upper and lower extremities. Good ROM, no contractures. Normal muscle tone.  Skin: Chronic stasis dermatitis changes appreciated bilaterally; no open wounds. Neurologic: CN 2-12 grossly intact. Sensation intact, DTR normal. Strength 5/5 in all 4.  Psychiatric: Normal judgment and insight. Alert and oriented x 3. Normal mood.   Labs on Admission: I have personally reviewed following labs and imaging studies  CBC: Recent Labs  Lab 10/23/20 0442  WBC 6.6  NEUTROABS 4.8  HGB 13.2  HCT 43.0  MCV 91.3  PLT 378    Basic Metabolic Panel: Recent Labs  Lab 10/23/20 0442  NA 134*  K 3.7  CL 99  CO2 20*  GLUCOSE 168*  BUN 16  CREATININE 0.92  CALCIUM 8.8*    GFR: Estimated Creatinine Clearance: 80.2 mL/min (by C-G formula based on SCr of 0.92 mg/dL).  Liver Function Tests: Recent Labs  Lab 10/23/20 0442  AST 62*  ALT 38  ALKPHOS 45  BILITOT 1.1  PROT 8.2*  ALBUMIN 3.4*    Urine analysis:    Component Value Date/Time   COLORURINE YELLOW 11/16/2015 2305   APPEARANCEUR CLOUDY (A) 11/16/2015 2305   LABSPEC 1.014 11/16/2015 2305   PHURINE 5.5 11/16/2015 2305   GLUCOSEU 100 (A) 11/16/2015 2305   HGBUR NEGATIVE 11/16/2015 2305   BILIRUBINUR NEGATIVE 11/16/2015 2305   KETONESUR NEGATIVE 11/16/2015 2305   PROTEINUR NEGATIVE 11/16/2015 2305   NITRITE NEGATIVE 11/16/2015 2305   LEUKOCYTESUR MODERATE (A) 11/16/2015 2305    Radiological Exams on Admission: DG Chest Port 1 View  Result Date: 10/23/2020 CLINICAL DATA:  Cough, COVID-19 and weakness EXAM: PORTABLE CHEST 1 VIEW COMPARISON:  Radiograph 12/25/2014 FINDINGS: Mixed patchy and interstitial opacity throughout the lungs with a basilar and peripheral predominance. No pneumothorax or visible effusion the portion of the costophrenic sulci are  collimated. The aorta is calcified. The remaining cardiomediastinal contours are unremarkable. No acute osseous or soft tissue abnormality. Degenerative changes are present in the imaged spine and  shoulders. IMPRESSION: Mixed patchy and interstitial opacity throughout the lungs with a basilar and peripheral predominance, compatible with a multifocal pneumonia in the setting of COVID-19 positivity. Electronically Signed   By: Lovena Le M.D.   On: 10/23/2020 04:29    Assessment/Plan 1-acute respiratory failure with hypoxia secondary to COVID-19 pneumonia -Patient saturation as low at 87% on room air -Significant improvement with just 2 L supplementation -Continue to follow inflammatory markers on daily basis -Continue treatment with remdesivir and steroids -Provide as needed bronchodilators, antitussive medications and the use of incentive spirometer/flutter valve. -Patient has been instructed for proning positioning -Started on vitamin C and zinc -Follow clinical response and wean off oxygen supplementation as tolerated.  2-morbid obesity -Body mass index is 55.97 kg/m. -Low-calorie diet, portion control and increase physical activity discussed with patient.  3-history of atrial fibrillation -Currently rate control -Continue the use of beta-blocker -Continue Xarelto for secondary prevention.  4-GERD/GI prophylaxis -Continue PPI  5-hypertension -Resume home antihypertensive agent -Blood pressure stable currently. -Low-sodium diet instructed.  6-depression/anxiety -No suicidal ideation or hallucination -Continue home antidepressant regimen.  7-lipidemia -Continue statins.  DVT prophylaxis: Chronically on Xarelto Code Status:   Full code Family Communication:  No family at bedside. Disposition Plan:   Patient is from:  Home  Anticipated DC to:  Home  Anticipated DC date:  To be determined  Anticipated DC barriers: Stabilization of patient's condition and respiratory  status. Consults called:  None Admission status:  Inpatient, telemetry bed; mental state more than 2 midnights.  Severity of Illness: Moderate severity with high risk for decompensation given patient's underlying comorbidities (obesity, diabetes, atrial fibrillation); presented with acute respiratory failure with hypoxia secondary to COVID-19 pneumonia.  Will provide treatment with IV remdesivir and IV steroids.  As needed bronchodilators and antitussive medications.   Barton Dubois MD [facility]  How to contact the [facility] Attending or Consulting provider [place] or covering provider during after hours [place], for this patient?   1. Check the care team in [facility] and look for a) attending/consulting [facility] provider listed and b) the [facility] team listed 2. Log into [email or web address] and use [place]'s universal password to access. If you do not have the password, please contact the hospital operator. 3. Locate the [facility] provider you are looking for under [facility] and page to a number that you can be directly reached. 4. If you still have difficulty reaching the provider, please page the [facility] (Director on Call) for the Hospitalists listed on amion for assistance.  10/23/2020, 4:37 PM

## 2020-10-23 NOTE — Progress Notes (Signed)
Obese patient with 1 week of covid symptoms, getting worse. Tachypneic, tachycardia with any exertion. O2 says drop to low 90s. Carry over.

## 2020-10-23 NOTE — ED Provider Notes (Signed)
[facility] EMERGENCY DEPARTMENT Provider Note   CSN: [id number] Arrival date & time: 10/22/20  2209     History Chief Complaint  Patient presents with  . Covid Positive    Melanie Reeves is a 70 y.o. female.  The history is provided by the patient.  Cough Severity:  Moderate Onset quality:  Gradual Timing:  Intermittent Progression:  Worsening Chronicity:  New Smoker: no   Relieved by:  Nothing Worsened by:  Nothing Associated symptoms: chills, fever, myalgias and shortness of breath   Associated symptoms: no chest pain   Patient with extensive history including atrial fibrillation, hypertension, obesity, diabetes Patient reports she began having symptoms of COVID about a week ago.  She reports cough, shortness of breath and fevers.  She tested positive on January 7 Over the past 1 to 2 days she has had worsening symptoms.  She reports fatigue and decreased appetite.   Patient is unvaccinated for COVID-19 Past Medical History:  Diagnosis Date  . Abnormal electrocardiogram 08/14/2014   LBBB  . Atrial fibrillation ([place])   . Diabetes mellitus ([place])   . [place] DEGENERATION 12/20/2009   Qualifier: Diagnosis of  By: Aline Brochure MD, Dorothyann Peng    . Dyspnea 08/14/2014  . Hyperlipidemia   . Hypertension   . LOW BACK PAIN 12/20/2009   Qualifier: Diagnosis of  By: Aline Brochure MD, Dorothyann Peng    . Morbid obesity ([place])   . OSA (obstructive sleep apnea) 08/16/2014  . Seasonal allergies   . SPINAL STENOSIS 12/20/2009   Qualifier: Diagnosis of  By: Aline Brochure MD, Dorothyann Peng      Patient Active Problem List   Diagnosis Date Noted  . Paroxysmal atrial fibrillation ([place]) 01/14/2019  . Hyperlipidemia 01/14/2019  . Diabetes mellitus ([place]) 01/14/2019  . OSA (obstructive sleep apnea) 08/16/2014  . Abnormal electrocardiogram 08/14/2014  . Essential hypertension 08/14/2014  . Dyspnea 08/14/2014  . Obesity 08/14/2014  . [place] DEGENERATION 12/20/2009  . SPINAL STENOSIS 12/20/2009  . LOW BACK PAIN  12/20/2009    Past Surgical History:  Procedure Laterality Date  . CHOLECYSTECTOMY    . COLONOSCOPY WITH PROPOFOL N/A 11/29/2014   Procedure: COLONOSCOPY WITH PROPOFOL;  Surgeon: Arta Silence, MD;  Location: [facility];  Service: Endoscopy;  Laterality: N/A;  . TONSILLECTOMY       OB History   No obstetric history on file.     Family History  Problem Relation Age of Onset  . Heart disease Father        MI  . Cancer - Lung Father        Small cell Lung CA    Social History   Tobacco Use  . Smoking status: Former Smoker    Packs/day: 2.00    Years: 20.00    Pack years: 40.00    Types: Cigarettes    Quit date: 11/22/1993    Years since quitting: 26.9  . Smokeless tobacco: Former Network engineer  . Vaping Use: Never used  Substance Use Topics  . Alcohol use: Yes    Alcohol/week: 0.0 standard drinks    Comment: Occasional  . Drug use: No    Home Medications Prior to Admission medications   Medication Sig Start Date End Date Taking? Authorizing Provider  Accu-Chek FastClix Lancets MISC Use as instructed to test three times daily. DX: E11.65 01/04/19   Elayne Snare, MD  atenolol (TENORMIN) 50 MG tablet TAKE 1 TABLET BY MOUTH TWICE A DAY 06/25/20   Fay Records, MD  Blood Glucose  Monitoring Suppl (ONETOUCH VERIO) w/Device KIT UAD to monitor glucose tid; Dx: E11.65 07/09/18   Reather Littler, MD  Continuous Blood Gluc Receiver (FREESTYLE LIBRE 2 READER) DEVI USE AS DIRECTED 09/13/20   Reather Littler, MD  Continuous Blood Gluc Sensor (FREESTYLE LIBRE 2 SENSOR) MISC APPLY TO SKIN EVERY 14 (FOURTEEN) DAYS AS DIRECTED 09/16/20   Reather Littler, MD  diclofenac sodium (VOLTAREN) 1 % GEL 2 (two) times daily as needed. Apply to affected area twice daily as needed. 07/13/19   [provider]  DULoxetine (CYMBALTA) 30 MG capsule TAKE 1 CAPSULE (30 MG TOTAL) BY MOUTH DAILY. TAKE IN ADDITION TO THE 60 MG 08/21/20   Reather Littler, MD  DULoxetine (CYMBALTA) 60 MG capsule Take 60 mg by mouth  daily. TAKE 1 TABLET BY MOUTH ONCE DAILY.    [provider]  fenofibrate (TRICOR) 145 MG tablet TAKE 1 TABLET BY MOUTH EVERY DAY 05/27/20   Reather Littler, MD  furosemide (LASIX) 40 MG tablet Take 40 mg by mouth daily. Take 1 tablet by mouth once daily. Patient not taking: Reported on 09/20/2020 05/10/19   [provider]  gabapentin (NEURONTIN) 600 MG tablet TAKE 1 TABLET BY MOUTH 3 TIMES A DAY AND 1 TABLET AT BEDTIME 10/09/20   Reather Littler, MD  HYDROcodone-acetaminophen (NORCO/VICODIN) 5-325 MG tablet every 6 (six) hours as needed.  10/09/15   [provider]  Insulin Pen Needle (B-D ULTRAFINE III SHORT PEN) 31G X 8 MM MISC Use to inject medication 5 times daily. 09/02/19   Reather Littler, MD  INVOKANA 300 MG TABS tablet TAKE 1 TABLET (300 MG TOTAL) BY MOUTH DAILY BEFORE BREAKFAST. 09/17/20   Reather Littler, MD  latanoprost (XALATAN) 0.005 % ophthalmic solution Place 1 drop into both eyes at bedtime.  Patient not taking: Reported on 09/20/2020    [provider]  loratadine (CLARITIN) 10 MG tablet Take 10 mg by mouth daily.    [provider]  metFORMIN (GLUCOPHAGE-XR) 500 MG 24 hr tablet TAKE 4 TABLETS (2,000 MG TOTAL) BY MOUTH DAILY WITH SUPPER. 08/27/20   Reather Littler, MD  NOVOLOG FLEXPEN 100 UNIT/ML FlexPen INJECT 20-35 UNITS UNDER THE SKIN THREE TIMES DAILY BEFORE MEALS. 10/09/20   Reather Littler, MD  Omega-3 Fatty Acids (FISH OIL) 1000 MG CAPS Take 2 capsules by mouth daily.    [provider]  ONETOUCH ULTRA test strip USE AS INSTRUCTED TO CHECK BLOOD SUGAR THREE TIMES DAILY. 10/09/19   Reather Littler, MD  pantoprazole (PROTONIX) 40 MG tablet Take 40 mg by mouth daily. Take 1 tablet by mouth once daily. Patient not taking: Reported on 09/20/2020 06/09/19   [provider]  potassium chloride SA (KLOR-CON) 20 MEQ tablet Take 1 tablet (20 mEq total) by mouth daily. 07/26/19   Reather Littler, MD  pravastatin (PRAVACHOL) 40 MG tablet TAKE 1 TABLET BY MOUTH  EVERY DAY 08/27/20   Reather Littler, MD  terazosin (HYTRIN) 2 MG capsule Take 2 mg by mouth daily. Take 1 tablet by mouth once daily. 06/15/19   [provider]  tiZANidine (ZANAFLEX) 2 MG tablet Take 2 mg by mouth every 6 (six) hours as needed for muscle spasms.    [provider]  TOUJEO MAX SOLOSTAR 300 UNIT/ML Solostar Pen INJECT 140 UNITS INTO THE SKIN DAILY. . 06/05/20   Reather Littler, MD  VICTOZA 18 MG/3ML SOPN INJECT 0.3 MLS (1.8 MG TOTAL) INTO THE SKIN DAILY. INJECT ONCE DAILY AT THE SAME TIME 04/26/20   Reather Littler, MD  XARELTO 20 MG TABS tablet TAKE 1 TABLET (20 MG TOTAL) BY MOUTH DAILY WITH SUPPER. 06/27/20   Fay Records, MD    Allergies    Sulfa antibiotics  Review of Systems   Review of Systems  Constitutional: Positive for appetite change, chills, fatigue and fever.  Respiratory: Positive for cough and shortness of breath.   Cardiovascular: Negative for chest pain.  Gastrointestinal: Positive for diarrhea.  Musculoskeletal: Positive for myalgias.  All other systems reviewed and are negative.   Physical Exam Updated Vital Signs BP (!) 159/78 (BP Location: Right Arm)   Pulse 99   Temp 99.3 F (37.4 C) (Oral)   Resp (!) 24   Ht 1.575 m ($Remove'5\' 2"'dwGiCNX$ )   Wt (!) 138.8 kg   SpO2 94%   BMI 55.97 kg/m   Physical Exam CONSTITUTIONAL: Chronically ill-appearing HEAD: Normocephalic/atraumatic EYES: EOMI/PERRL ENMT: Mucous membranes moist NECK: supple no meningeal signs SPINE/BACK:entire spine nontender CV: S1/S2 noted, no murmurs/rubs/gallops noted LUNGS: Tachypnea, crackles bilaterally ABDOMEN: soft, nontender, no rebound or guarding, bowel sounds noted throughout abdomen, obese GU:no cva tenderness NEURO: Pt is awake/alert/appropriate, moves all extremitiesx4.  No facial droop.   EXTREMITIES: pulses normal/equal, full ROM SKIN: warm, color normal PSYCH: no abnormalities of mood noted, alert and oriented to situation  ED Results / Procedures / Treatments    Labs (all labs ordered are listed, but only abnormal results are displayed) Labs Reviewed  COMPREHENSIVE METABOLIC PANEL - Abnormal; Notable for the following components:      Result Value   Sodium 134 (*)    CO2 20 (*)    Glucose, Bld 168 (*)    Calcium 8.8 (*)    Total Protein 8.2 (*)    Albumin 3.4 (*)    AST 62 (*)    All other components within normal limits  LACTIC ACID, PLASMA - Abnormal; Notable for the following components:   Lactic Acid, Venous 2.3 (*)    All other components within normal limits  D-DIMER, QUANTITATIVE (NOT AT [facility]) - Abnormal; Notable for the following components:   D-Dimer, Quant 0.94 (*)    All other components within normal limits  LACTATE DEHYDROGENASE - Abnormal; Notable for the following components:   LDH 323 (*)    All other components within normal limits  FIBRINOGEN - Abnormal; Notable for the following components:   Fibrinogen 628 (*)    All other components within normal limits  C-REACTIVE PROTEIN - Abnormal; Notable for the following components:   CRP 9.4 (*)    All other components within normal limits  POC SARS CORONAVIRUS 2 AG -  ED - Abnormal; Notable for the following components:   SARS Coronavirus 2 Ag POSITIVE (*)    All other components within normal limits  CULTURE, BLOOD (ROUTINE X 2)  CULTURE, BLOOD (ROUTINE X 2)  CBC WITH DIFFERENTIAL/PLATELET  PROCALCITONIN  FERRITIN  TRIGLYCERIDES  LACTIC ACID, PLASMA    EKG ED ECG REPORT   Date: 10/23/2020 0429  Rate: 100  Rhythm: sinus tachycardia  QRS Axis: normal  Intervals: normal  ST/T Wave abnormalities: nonspecific ST changes  Conduction Disutrbances:left bundle branch block   Unchanged from prior  I have personally reviewed the EKG tracing and agree with the computerized printout as noted.  Radiology DG Chest Port 1 View  Result Date: 10/23/2020 CLINICAL DATA:  Cough, COVID-19 and weakness EXAM: PORTABLE CHEST 1 VIEW COMPARISON:  Radiograph 12/25/2014 FINDINGS:  Mixed patchy and interstitial opacity throughout the lungs with a basilar and  peripheral predominance. No pneumothorax or visible effusion the portion of the costophrenic sulci are collimated. The aorta is calcified. The remaining cardiomediastinal contours are unremarkable. No acute osseous or soft tissue abnormality. Degenerative changes are present in the imaged spine and shoulders. IMPRESSION: Mixed patchy and interstitial opacity throughout the lungs with a basilar and peripheral predominance, compatible with a multifocal pneumonia in the setting of COVID-19 positivity. Electronically Signed   By: Lovena Le M.D.   On: 10/23/2020 04:29    Procedures .Critical Care Performed by: Ripley Fraise, MD Authorized by: Ripley Fraise, MD   Critical care provider statement:    Critical care time (minutes):  45   Critical care start time:  10/23/2020 6:00 AM   Critical care end time:  10/23/2020 6:45 AM   Critical care time was exclusive of:  Separately billable procedures and treating other patients   Critical care was necessary to treat or prevent imminent or life-threatening deterioration of the following conditions:  Respiratory failure and sepsis   Critical care was time spent personally by me on the following activities:  Development of treatment plan with patient or surrogate, discussions with consultants, re-evaluation of patient's condition, pulse oximetry, ordering and review of radiographic studies, ordering and review of laboratory studies and ordering and performing treatments and interventions   I assumed direction of critical care for this patient from another provider in my specialty: no     Care discussed with: admitting provider       Medications Ordered in ED Medications  remdesivir 100 mg in sodium chloride 0.9 % 100 mL IVPB (has no administration in time range)  remdesivir 100 mg in sodium chloride 0.9 % 100 mL IVPB (has no administration in time range)  methylPREDNISolone  sodium succinate (SOLU-MEDROL) 125 mg/2 mL injection 125 mg (has no administration in time range)    ED Course  I have reviewed the triage vital signs and the nursing notes.  Pertinent labs & imaging results that were available during my care of the patient were reviewed by me and considered in my medical decision making (see chart for details).    MDM Rules/Calculators/A&P                          5:29 AM Patient reports worsening symptoms of COVID-19.  She has had symptoms for up to a week.  She appears tachypneic.  X-rays reveal infiltrates.  Work-up is pending at this time 6:48 AM Patient with worsening symptoms.  With ambulation she became tachycardic to the 130s, her pulse ox has dropped to 91% Patient is high risk for deterioration as she is morbidly obese with multiple comorbidities and is unvaccinated.  Discussed with Dr. Josph Macho Plan to start remdesivir, and Solu-Medrol at hospitalist request.  The patient was updated on plan  Melanie Reeves was evaluated in Emergency Department on 10/23/2020 for the symptoms described in the history of present illness. She was evaluated in the context of the global COVID-19 pandemic, which necessitated consideration that the patient might be at risk for infection with the SARS-CoV-2 virus that causes COVID-19. Institutional protocols and algorithms that pertain to the evaluation of patients at risk for COVID-19 are in a state of rapid change based on information released by regulatory bodies including the CDC and federal and state organizations. These policies and algorithms were followed during the patient's care in the ED.  Final Clinical Impression(s) / ED Diagnoses Final diagnoses:  Pneumonia due to COVID-19  virus  Hypoxia    Rx / DC Orders ED Discharge Orders    None       Ripley Fraise, MD 10/23/20 [phone]

## 2020-10-23 NOTE — ED Notes (Signed)
Pt ate 50% of lunch provided 

## 2020-10-23 NOTE — ED Notes (Signed)
Large pitcher of ice given

## 2020-10-24 DIAGNOSIS — Z794 Long term (current) use of insulin: Secondary | ICD-10-CM

## 2020-10-24 DIAGNOSIS — E66813 Obesity, class 3: Secondary | ICD-10-CM

## 2020-10-24 DIAGNOSIS — E1165 Type 2 diabetes mellitus with hyperglycemia: Secondary | ICD-10-CM

## 2020-10-24 DIAGNOSIS — J9600 Acute respiratory failure, unspecified whether with hypoxia or hypercapnia: Secondary | ICD-10-CM

## 2020-10-24 LAB — COMPREHENSIVE METABOLIC PANEL
ALT: 32 U/L (ref 0–44)
AST: 36 U/L (ref 15–41)
Albumin: 2.9 g/dL — ABNORMAL LOW (ref 3.5–5.0)
Alkaline Phosphatase: 37 U/L — ABNORMAL LOW (ref 38–126)
Anion gap: 9 (ref 5–15)
BUN: 27 mg/dL — ABNORMAL HIGH (ref 8–23)
CO2: 20 mmol/L — ABNORMAL LOW (ref 22–32)
Calcium: 8.1 mg/dL — ABNORMAL LOW (ref 8.9–10.3)
Chloride: 105 mmol/L (ref 98–111)
Creatinine, Ser: 1.01 mg/dL — ABNORMAL HIGH (ref 0.44–1.00)
GFR, Estimated: >60 mL/min (ref >60)
Glucose, Bld: 271 mg/dL — ABNORMAL HIGH (ref 70–99)
Potassium: 4.1 mmol/L (ref 3.5–5.1)
Sodium: 134 mmol/L — ABNORMAL LOW (ref 135–145)
Total Bilirubin: 0.7 mg/dL (ref 0.3–1.2)
Total Protein: 7 g/dL (ref 6.5–8.1)

## 2020-10-24 LAB — CBC WITH DIFFERENTIAL/PLATELET
Abs Immature Granulocytes: 0.02 10*3/uL (ref 0.00–0.07)
Basophils Absolute: 0 10*3/uL (ref 0.0–0.1)
Basophils Relative: 0 %
Eosinophils Absolute: 0 10*3/uL (ref 0.0–0.5)
Eosinophils Relative: 0 %
HCT: 38.4 % (ref 36.0–46.0)
Hemoglobin: 11.8 g/dL — ABNORMAL LOW (ref 12.0–15.0)
Immature Granulocytes: 1 %
Lymphocytes Relative: 21 %
Lymphs Abs: 0.8 10*3/uL (ref 0.7–4.0)
MCH: 27.8 pg (ref 26.0–34.0)
MCHC: 30.7 g/dL (ref 30.0–36.0)
MCV: 90.6 fL (ref 80.0–100.0)
Monocytes Absolute: 0.2 10*3/uL (ref 0.1–1.0)
Monocytes Relative: 4 %
Neutro Abs: 2.9 10*3/uL (ref 1.7–7.7)
Neutrophils Relative %: 74 %
Platelets: 203 10*3/uL (ref 150–400)
RBC: 4.24 MIL/uL (ref 3.87–5.11)
RDW: 14.5 % (ref 11.5–15.5)
WBC: 3.9 10*3/uL — ABNORMAL LOW (ref 4.0–10.5)
nRBC: 0 % (ref 0.0–0.2)

## 2020-10-24 LAB — C-REACTIVE PROTEIN: CRP: 7.7 mg/dL — ABNORMAL HIGH (ref <1.0)

## 2020-10-24 LAB — D-DIMER, QUANTITATIVE: D-Dimer, Quant: 0.61 ug/mL-FEU — ABNORMAL HIGH (ref 0.00–0.50)

## 2020-10-24 LAB — CBG MONITORING, ED
Glucose-Capillary: 238 mg/dL — ABNORMAL HIGH (ref 70–99)
Glucose-Capillary: 309 mg/dL — ABNORMAL HIGH (ref 70–99)
Glucose-Capillary: 331 mg/dL — ABNORMAL HIGH (ref 70–99)

## 2020-10-24 LAB — GLUCOSE, CAPILLARY
Glucose-Capillary: 359 mg/dL — ABNORMAL HIGH (ref 70–99)
Glucose-Capillary: 365 mg/dL — ABNORMAL HIGH (ref 70–99)

## 2020-10-24 LAB — MAGNESIUM: Magnesium: 2 mg/dL (ref 1.7–2.4)

## 2020-10-24 LAB — LACTIC ACID, PLASMA: Lactic Acid, Venous: 1.3 mmol/L (ref 0.5–1.9)

## 2020-10-24 LAB — PHOSPHORUS: Phosphorus: 2.8 mg/dL (ref 2.5–4.6)

## 2020-10-24 LAB — FERRITIN: Ferritin: 247 ng/mL (ref 11–307)

## 2020-10-24 MED ORDER — ATENOLOL 25 MG PO TABS
25.0000 mg | ORAL_TABLET | Freq: Every day | ORAL | Status: DC
  Administered 2020-10-24 – 2020-10-25 (×2): 25 mg via ORAL
  Filled 2020-10-24 (×2): qty 1

## 2020-10-24 MED ORDER — INSULIN DETEMIR 100 UNIT/ML ~~LOC~~ SOLN
40.0000 [IU] | Freq: Two times a day (BID) | SUBCUTANEOUS | Status: DC
  Administered 2020-10-24 – 2020-10-25 (×3): 40 [IU] via SUBCUTANEOUS
  Filled 2020-10-24 (×5): qty 0.4

## 2020-10-24 MED ORDER — METHYLPREDNISOLONE SODIUM SUCC 125 MG IJ SOLR
INTRAMUSCULAR | Status: AC
  Filled 2020-10-24: qty 2

## 2020-10-24 MED ORDER — INSULIN ASPART 100 UNIT/ML ~~LOC~~ SOLN
8.0000 [IU] | Freq: Three times a day (TID) | SUBCUTANEOUS | Status: DC
  Administered 2020-10-24 – 2020-10-25 (×5): 8 [IU] via SUBCUTANEOUS
  Filled 2020-10-24 (×3): qty 1

## 2020-10-24 NOTE — ED Notes (Signed)
Pt's bed and purewick changed.

## 2020-10-24 NOTE — Progress Notes (Signed)
PROGRESS NOTE  Melanie Reeves FWY:[id number] DOB: 22-Jul-1951 DOA: 10/23/2020 PCP: Ileana Ladd, MD  Brief History:  70 year old female with a history of hypertension, paroxysmal atrial fibrillation, diabetes mellitus type 2, hyperlipidemia, OSA, left bundle branch block, depression/anxiety presenting with approximately 1 week history of shortness of breath, nonproductive cough, and generalized weakness.  The patient states that her symptoms began around 10/18/2019.  She has had decreased oral intake.  She has had some nausea and vomiting, but states that this has improved.  She has had some loose stools.  She denies any hemoptysis, hematochezia, melena.  She has had some low-grade fevers.  She has not vaccinated for COVID-19.  Because of her progressive symptoms she presented for further evaluation. In ED, the patient had temperature 99.9 F.  She was hemodynamically stable with oxygen saturation 88% on room air.  She was placed on 2 L but saturation 94-95%.  Assessment/Plan: Acute respiratory failure with hypoxia secondary to COVID-19 -Personally reviewed chest x-ray--patchy bilateral opacities -Conitnue remdesivir -Continue IV steroids -Presented with oxygen saturation 88% room air -Currently stable on 2 L -CRP 9.4>>7.7 -Ferritin 249>>247 -D-dimer 0.94>>0.61 -Continue vitamin C and zinc -Prone positioning as tolerated  Uncontrolled diabetes mellitus type 2 with hyperglycemia -10/24/2019 hemoglobin A1c 7.7 -Increase Levemir 40 units bid -Resistant NovoLog sliding scale  Essential hypertension -Restart atenolol at lower dose secondary to soft blood pressure  Paroxysmal atrial fibrillation -Currently in sinus rhythm -Continue rivaroxaban  Depression/anxiety -Continue Cymbalta  Hyperlipidemia -Continue statin  Morbid obesity -BMI 55.97 -Lifestyle modification     Status is: Inpatient  Remains inpatient appropriate because:IV treatments appropriate due to  intensity of illness or inability to take PO   Dispo: The patient is from: Home              Anticipated d/c is to: Home              Anticipated d/c date is: 2 days              Patient currently is not medically stable to d/c.        Family Communication:   Sister updated 1/12  Consultants:  none  Code Status:  FULL   DVT Prophylaxis Xarelto  Procedures: As Listed in Progress Note Above  Antibiotics: None      Subjective: Patient states that her breathing is little better than yesterday.  She has nonproductive cough.  She denies any vomiting, diarrhea, hematochezia, melena.  Objective: Vitals:   10/24/20 0100 10/24/20 0200 10/24/20 0500 10/24/20 0600  BP: (!) 110/55 (!) 113/59 (!) 111/59 113/64  Pulse: 60 63 63 61  Resp: 18 19 18 16   Temp:      TempSrc:      SpO2: 95% 94% 94% 94%  Weight:      Height:        Intake/Output Summary (Last 24 hours) at 10/24/2020 0709 Last data filed at 10/24/2020 12/22/2020 Gross per 24 hour  Intake 100 ml  Output 750 ml  Net -650 ml   Weight change:  Exam:   General:  Pt is alert, follows commands appropriately, not in acute distress  HEENT: No icterus, No thrush, No neck mass, [place]/AT  Cardiovascular: RRR, S1/S2, no rubs, no gallops  Respiratory: Bilateral scattered rales, left greater than right.  No wheezing  Abdomen: Soft/+BS, non tender, non distended, no guarding  Extremities: 1+LE edema, No lymphangitis, No petechiae, No rashes, no synovitis  Data Reviewed: I have personally reviewed following labs and imaging studies Basic Metabolic Panel: Recent Labs  Lab 10/23/20 0442 10/24/20 0502  NA 134* 134*  K 3.7 4.1  CL 99 105  CO2 20* 20*  GLUCOSE 168* 271*  BUN 16 27*  CREATININE 0.92 1.01*  CALCIUM 8.8* 8.1*  MG  --  2.0  PHOS  --  2.8   Liver Function Tests: Recent Labs  Lab 10/23/20 0442 10/24/20 0502  AST 62* 36  ALT 38 32  ALKPHOS 45 37*  BILITOT 1.1 0.7  PROT 8.2* 7.0  ALBUMIN 3.4*  2.9*   No results for input(s): LIPASE, AMYLASE in the last 168 hours. No results for input(s): AMMONIA in the last 168 hours. Coagulation Profile: No results for input(s): INR, PROTIME in the last 168 hours. CBC: Recent Labs  Lab 10/23/20 0442 10/24/20 0502  WBC 6.6 3.9*  NEUTROABS 4.8 2.9  HGB 13.2 11.8*  HCT 43.0 38.4  MCV 91.3 90.6  PLT 225 203   Cardiac Enzymes: No results for input(s): CKTOTAL, CKMB, CKMBINDEX, TROPONINI in the last 168 hours. BNP: Invalid input(s): POCBNP CBG: Recent Labs  Lab 10/23/20 0819 10/23/20 1256 10/23/20 1723 10/23/20 2108  GLUCAP 152* 218* 233* 286*   HbA1C: No results for input(s): HGBA1C in the last 72 hours. Urine analysis:    Component Value Date/Time   COLORURINE YELLOW 11/16/2015 2305   APPEARANCEUR CLOUDY (A) 11/16/2015 2305   LABSPEC 1.014 11/16/2015 2305   PHURINE 5.5 11/16/2015 2305   GLUCOSEU 100 (A) 11/16/2015 2305   HGBUR NEGATIVE 11/16/2015 2305   BILIRUBINUR NEGATIVE 11/16/2015 2305   KETONESUR NEGATIVE 11/16/2015 2305   PROTEINUR NEGATIVE 11/16/2015 2305   NITRITE NEGATIVE 11/16/2015 2305   LEUKOCYTESUR MODERATE (A) 11/16/2015 2305   Sepsis Labs: @LABRCNTIP (procalcitonin:4,lacticidven:4) ) Recent Results (from the past 240 hour(s))  Blood Culture (routine x 2)     Status: None (Preliminary result)   Collection Time: 10/23/20  5:35 AM   Specimen: Right Antecubital; Blood  Result Value Ref Range Status   Specimen Description RIGHT ANTECUBITAL BLOOD  Final   Special Requests   Final    Blood Culture results may not be optimal due to an excessive volume of blood received in culture bottles BOTTLES DRAWN AEROBIC AND ANAEROBIC   Culture   Final    NO GROWTH < 24 HOURS Performed at [facility], [address] [place], [place]    Report Status PENDING  Incomplete  Blood Culture (routine x 2)     Status: None (Preliminary result)   Collection Time: 10/23/20  5:35 AM   Specimen: Left Antecubital;  Blood  Result Value Ref Range Status   Specimen Description LEFT ANTECUBITAL BLOOD  Final   Special Requests   Final    Blood Culture adequate volume BOTTLES DRAWN AEROBIC AND ANAEROBIC   Culture   Final    NO GROWTH < 24 HOURS Performed at [facility], [address] [place], [place]    Report Status PENDING  Incomplete     Scheduled Meds: . vitamin C  500 mg Oral Daily  . DULoxetine  60 mg Oral Daily  . fenofibrate  160 mg Oral Daily  . gabapentin  600 mg Oral TID  . insulin aspart  0-20 Units Subcutaneous TID WC  . insulin aspart  0-5 Units Subcutaneous QHS  . insulin aspart  3 Units Subcutaneous TID WC  . insulin detemir  15 Units Subcutaneous BID  . Ipratropium-Albuterol  1 puff Inhalation TID  . loratadine  10 mg Oral Daily  . pantoprazole  40 mg Oral Daily  . pravastatin  40 mg Oral Daily  . [START ON 10/26/2020] predniSONE  50 mg Oral Daily  . rivaroxaban  20 mg Oral Daily  . zinc sulfate  220 mg Oral Daily   Continuous Infusions: . methylPREDNISolone (SOLU-MEDROL) injection Stopped (10/23/20 2014)  . remdesivir 100 mg in NS 100 mL      Procedures/Studies: DG Chest Port 1 View  Result Date: 10/23/2020 CLINICAL DATA:  Cough, COVID-19 and weakness EXAM: PORTABLE CHEST 1 VIEW COMPARISON:  Radiograph 12/25/2014 FINDINGS: Mixed patchy and interstitial opacity throughout the lungs with a basilar and peripheral predominance. No pneumothorax or visible effusion the portion of the costophrenic sulci are collimated. The aorta is calcified. The remaining cardiomediastinal contours are unremarkable. No acute osseous or soft tissue abnormality. Degenerative changes are present in the imaged spine and shoulders. IMPRESSION: Mixed patchy and interstitial opacity throughout the lungs with a basilar and peripheral predominance, compatible with a multifocal pneumonia in the setting of COVID-19 positivity. Electronically Signed   By: Kreg Shropshire M.D.   On: 10/23/2020 04:29     Catarina Hartshorn, DO  [facility]  If 7PM-7AM, please contact night-coverage [email or web address] Password [email or web address] 10/24/2020, 7:09 AM   LOS: 1 day

## 2020-10-25 LAB — COMPREHENSIVE METABOLIC PANEL
ALT: 28 U/L (ref 0–44)
AST: 26 U/L (ref 15–41)
Albumin: 3 g/dL — ABNORMAL LOW (ref 3.5–5.0)
Alkaline Phosphatase: 41 U/L (ref 38–126)
Anion gap: 10 (ref 5–15)
BUN: 31 mg/dL — ABNORMAL HIGH (ref 8–23)
CO2: 25 mmol/L (ref 22–32)
Calcium: 8.8 mg/dL — ABNORMAL LOW (ref 8.9–10.3)
Chloride: 106 mmol/L (ref 98–111)
Creatinine, Ser: 0.99 mg/dL (ref 0.44–1.00)
GFR, Estimated: >60 mL/min (ref >60)
Glucose, Bld: 309 mg/dL — ABNORMAL HIGH (ref 70–99)
Potassium: 4.2 mmol/L (ref 3.5–5.1)
Sodium: 141 mmol/L (ref 135–145)
Total Bilirubin: 0.4 mg/dL (ref 0.3–1.2)
Total Protein: 7.3 g/dL (ref 6.5–8.1)

## 2020-10-25 LAB — CBC WITH DIFFERENTIAL/PLATELET
Abs Immature Granulocytes: 0.05 10*3/uL (ref 0.00–0.07)
Basophils Absolute: 0 10*3/uL (ref 0.0–0.1)
Basophils Relative: 0 %
Eosinophils Absolute: 0 10*3/uL (ref 0.0–0.5)
Eosinophils Relative: 0 %
HCT: 41.1 % (ref 36.0–46.0)
Hemoglobin: 12.6 g/dL (ref 12.0–15.0)
Immature Granulocytes: 1 %
Lymphocytes Relative: 13 %
Lymphs Abs: 1.1 10*3/uL (ref 0.7–4.0)
MCH: 28.1 pg (ref 26.0–34.0)
MCHC: 30.7 g/dL (ref 30.0–36.0)
MCV: 91.5 fL (ref 80.0–100.0)
Monocytes Absolute: 0.3 10*3/uL (ref 0.1–1.0)
Monocytes Relative: 4 %
Neutro Abs: 6.6 10*3/uL (ref 1.7–7.7)
Neutrophils Relative %: 82 %
Platelets: 242 10*3/uL (ref 150–400)
RBC: 4.49 MIL/uL (ref 3.87–5.11)
RDW: 14.6 % (ref 11.5–15.5)
WBC: 8 10*3/uL (ref 4.0–10.5)
nRBC: 0 % (ref 0.0–0.2)

## 2020-10-25 LAB — FERRITIN: Ferritin: 264 ng/mL (ref 11–307)

## 2020-10-25 LAB — GLUCOSE, CAPILLARY
Glucose-Capillary: 288 mg/dL — ABNORMAL HIGH (ref 70–99)
Glucose-Capillary: 389 mg/dL — ABNORMAL HIGH (ref 70–99)

## 2020-10-25 LAB — D-DIMER, QUANTITATIVE: D-Dimer, Quant: 0.3 ug/mL-FEU (ref 0.00–0.50)

## 2020-10-25 LAB — PHOSPHORUS: Phosphorus: 3 mg/dL (ref 2.5–4.6)

## 2020-10-25 LAB — MAGNESIUM: Magnesium: 2.3 mg/dL (ref 1.7–2.4)

## 2020-10-25 LAB — C-REACTIVE PROTEIN: CRP: 3.8 mg/dL — ABNORMAL HIGH (ref <1.0)

## 2020-10-25 MED ORDER — PREDNISONE 20 MG PO TABS: 60.0000 mg | ORAL_TABLET | Freq: Two times a day (BID) | ORAL | Status: DC

## 2020-10-25 MED ORDER — PREDNISONE 20 MG PO TABS: 60.0000 mg | ORAL_TABLET | Freq: Two times a day (BID) | ORAL | 0 refills | Status: DC

## 2020-10-25 MED ORDER — ATENOLOL 25 MG PO TABS: 25.0000 mg | ORAL_TABLET | Freq: Every day | ORAL | 1 refills | Status: AC

## 2020-10-25 NOTE — Progress Notes (Signed)
Discharge instructions given to pt. All questions answered. All belongings sent with pt/ oxygen education given. Per spouse, all equipment and oxygen is delivered to house. Await for transportattion.

## 2020-10-25 NOTE — Progress Notes (Signed)
SATURATION QUALIFICATIONS: (This note is used to comply with regulatory documentation for home oxygen)  Patient Saturations on Room Air at Rest = 95%  Patient Saturations on Room Air while Ambulating = 80%  Patient Saturations on 2 Liters of oxygen while Ambulating = 94%  Please briefly explain why patient needs home oxygen:

## 2020-10-25 NOTE — Clinical Social Work Note (Signed)
Patient qualifies for home oxygen. Needs  BSC and RW. Discussed DME providers. Referral made to [employer].  Portable oxygen delivered to room. RW and BSC will be delivered to home with home oxygen concentrator. RN made aware.    Sophi Calligan, Juleen China, LCSW

## 2020-10-25 NOTE — Care Management Important Message (Signed)
Important Message  Patient Details  Name: Melanie Reeves MRN: [id number] Date of Birth: 1951-02-27   Medicare Important Message Given:  Yes     Annice Needy, LCSW 10/25/2020, 1:09 PM

## 2020-10-25 NOTE — Plan of Care (Signed)
  Problem: Education: Goal: Knowledge of risk factors and measures for prevention of condition will improve Outcome: Progressing   Problem: Coping: Goal: Psychosocial and spiritual needs will be supported Outcome: Progressing   Problem: Respiratory: Goal: Will maintain a patent airway Outcome: Progressing Goal: Complications related to the disease process, condition or treatment will be avoided or minimized Outcome: Progressing   

## 2020-10-25 NOTE — Evaluation (Signed)
Physical Therapy Evaluation Patient Details Name: Melanie Reeves MRN: [id number] DOB: [birth year] Today's Date: 10/25/2020   History of Present Illness  Melanie Reeves is a 70 y.o. female with medical history significant of hypertension, paroxysmal atrial fibrillation, type 2 diabetes, morbid obesity, hyperlipidemia, seasonal allergies and depression/anxiety; who presented to the hospital secondary to general malaise, shortness of breath, chills, fevers, nonproductive cough and difficulty breathing.  Patient is present and has been present for the last 3-4 days and worsening; on January 7 she tested positive for COVID, but at that time was no demonstrating hypoxia.  Today she fell short of breath even at rest and express having difficulty speaking in full sentences.  She presented to the emergency department for further evaluation and management.  Patient denies chest pain, nausea/vomiting, dysuria, hematuria, melena, hematochezia, focal weakness or any other complaints.    Clinical Impression  Patient functioning near baseline for functional mobility and gait, requires increased time with labored movement for completing sit to stands using SPC, slightly unsteady labored cadence without loss of balance when taking steps in room, limited mostly due to fatigue and SpO2 dropping from 94% to 80% while on room air, once seated SpO2 increased above 95% - RN notified.  Patient tolerated sitting up at bedside after therapy.  Plan:  Patient to be discharged home today and discharged from physical therapy to care of nursing for ambulation daily as tolerated for length of stay.       Follow Up Recommendations Home health PT;Supervision for mobility/OOB;Supervision - Intermittent    Equipment Recommendations  Rolling walker with 5" wheels;3in1 (PT)    Recommendations for Other Services       Precautions / Restrictions Precautions Precautions: Fall Restrictions Weight Bearing Restrictions: No       Mobility  Bed Mobility Overal bed mobility: Modified Independent             General bed mobility comments: slightly increased time, had to use bedrail with head of bed flat    Transfers Overall transfer level: Needs assistance Equipment used: Straight cane Transfers: Sit to/from Stand;Stand Pivot Transfers Sit to Stand: Supervision Stand pivot transfers: Supervision       General transfer comment: increased time, labored movement  Ambulation/Gait Ambulation/Gait assistance: Supervision Gait Distance (Feet): 22 Feet Assistive device: Straight cane Gait Pattern/deviations: Decreased step length - right;Decreased step length - left;Decreased stride length Gait velocity: decreased   General Gait Details: slow labored cadence without loss of balance, limited mostly due to fatigue with SpO2 dropping from 94% to 80% while on room air, quickly recovered above 95% once seated  Stairs            Wheelchair Mobility    Modified Rankin (Stroke Patients Only)       Balance Overall balance assessment: Needs assistance Sitting-balance support: Feet supported;No upper extremity supported Sitting balance-Leahy Scale: Good Sitting balance - Comments: seated at EOB   Standing balance support: During functional activity;Single extremity supported Standing balance-Leahy Scale: Fair Standing balance comment: using SPC                             Pertinent Vitals/Pain Pain Assessment: No/denies pain    Home Living Family/patient expects to be discharged to:: Private residence Living Arrangements: Spouse/significant other Available Help at Discharge: Family;Available 24 hours/day Type of Home: House Home Access: Stairs to enter Entrance Stairs-Rails: None Entrance Stairs-Number of Steps: 1 Home Layout: One level Home Equipment:  Cane - single point      Prior Function Level of Independence: Needs assistance   Gait / Transfers Assistance Needed:  household ambulator using [facility]  ADL's / Homemaking Assistance Needed: family assist with ADL's        Hand Dominance   Dominant Hand: Right    Extremity/Trunk Assessment   Upper Extremity Assessment Upper Extremity Assessment: Overall WFL for tasks assessed    Lower Extremity Assessment Lower Extremity Assessment: Generalized weakness    Cervical / Trunk Assessment Cervical / Trunk Assessment: Normal  Communication   Communication: No difficulties  Cognition Arousal/Alertness: Awake/alert Behavior During Therapy: WFL for tasks assessed/performed Overall Cognitive Status: Within Functional Limits for tasks assessed                                        General Comments      Exercises     Assessment/Plan    PT Assessment All further PT needs can be met in the next venue of care  PT Problem List Decreased strength;Decreased activity tolerance;Decreased balance;Decreased mobility       PT Treatment Interventions      PT Goals (Current goals can be found in the Care Plan section)  Acute Rehab PT Goals Patient Stated Goal: return home with family to assist PT Goal Formulation: With patient Time For Goal Achievement: 10/25/20 Potential to Achieve Goals: Good    Frequency     Barriers to discharge        Co-evaluation               AM-PAC PT "6 Clicks" Mobility  Outcome Measure Help needed turning from your back to your side while in a flat bed without using bedrails?: None Help needed moving from lying on your back to sitting on the side of a flat bed without using bedrails?: A Little Help needed moving to and from a bed to a chair (including a wheelchair)?: A Little Help needed standing up from a chair using your arms (e.g., wheelchair or bedside chair)?: A Little Help needed to walk in hospital room?: A Little Help needed climbing 3-5 steps with a railing? : A Lot 6 Click Score: 18    End of Session   Activity Tolerance:  Patient tolerated treatment well;Patient limited by fatigue Patient left: in bed;with call bell/phone within reach Nurse Communication: Mobility status PT Visit Diagnosis: Unsteadiness on feet (R26.81);Other abnormalities of gait and mobility (R26.89);Muscle weakness (generalized) (M62.81)    Time: 1010-1033 PT Time Calculation (min) (ACUTE ONLY): 23 min   Charges:   PT Evaluation $PT Eval Moderate Complexity: 1 Mod PT Treatments $Therapeutic Activity: 23-37 mins        1:49 PM, 10/25/20 Lonell Grandchild, MPT Physical Therapist with [facility] [phone] office [phone] mobile phone

## 2020-10-25 NOTE — Discharge Summary (Signed)
Physician Discharge Summary  Melanie Reeves AYT:[id number] DOB: 09/05/1951 DOA: 10/23/2020  PCP: Vernie Shanks, MD  Admit date: 10/23/2020 Discharge date: 10/25/2020  Admitted From: Home Disposition:  Home   Recommendations for Outpatient Follow-up:  1. Follow up with PCP in 1-2 weeks 2. Please obtain BMP/CBC in one week   Home Health: YES Equipment/Devices: 2 L [place]  Discharge Condition: Stable CODE STATUS: FULL Diet recommendation: Heart Healthy / Carb Modified   Brief/Interim Summary: 70 year old female with a history of hypertension, paroxysmal atrial fibrillation, diabetes mellitus type 2, hyperlipidemia, OSA, left bundle branch block, depression/anxiety presenting with approximately 1 week history of shortness of breath, nonproductive cough, and generalized weakness.  The patient states that her symptoms began around 10/18/2019.  She has had decreased oral intake.  She has had some nausea and vomiting, but states that this has improved.  She has had some loose stools.  She denies any hemoptysis, hematochezia, melena.  She has had some low-grade fevers.  She has not vaccinated for COVID-19.  Because of her progressive symptoms she presented for further evaluation. In ED, the patient had temperature 99.9 F.  She was hemodynamically stable with oxygen saturation 88% on room air.  She was placed on 2 L but saturation 94-95%. She was started on remdesivir and IV steroids with clinical improvement  Discharge Diagnoses:  Acute respiratory failure with hypoxia secondary to COVID-19 -Personally reviewed chest x-ray--patchy bilateral opacities -Conitnue remdesivir -Continue IV steroids -Presented with oxygen saturation 88% room air -Currently stable on 2 L -CRP 9.4>>7.7>>3.8 -Ferritin 249>>247>>264 -D-dimer 0.94>>0.61>>0.30 -Continue vitamin C and zinc -Prone positioning as tolerated -on day of d/c patient desaturated with ambulation on RA--she was set up with 2L [place] for  discharge  Uncontrolled diabetes mellitus type 2 with hyperglycemia -10/24/2019 hemoglobin A1c 7.7 -Increase Levemir 40 units bid>>restart home dose Toujeo after dc -Resistant NovoLog sliding scale  Essential hypertension -Restart atenolol at lower dose secondary to soft blood pressure -d/c Hytrin  Paroxysmal atrial fibrillation -Currently in sinus rhythm -Continue rivaroxaban  Depression/anxiety -Continue Cymbalta  Hyperlipidemia -Continue statin  Morbid obesity -BMI 55.97 -Lifestyle modification     Discharge Instructions   Allergies as of 10/25/2020      Reactions   Simvastatin    Other reaction(s): leg weakness   Sulfa Antibiotics Other (See Comments)   Cant remember      Medication List    STOP taking these medications   diclofenac sodium 1 % Gel Commonly known as: VOLTAREN   furosemide 40 MG tablet Commonly known as: LASIX   HYDROcodone-acetaminophen 5-325 MG tablet Commonly known as: NORCO/VICODIN   latanoprost 0.005 % ophthalmic solution Commonly known as: XALATAN   potassium chloride SA 20 MEQ tablet Commonly known as: KLOR-CON   terazosin 2 MG capsule Commonly known as: HYTRIN   tiZANidine 2 MG tablet Commonly known as: ZANAFLEX     TAKE these medications   Accu-Chek FastClix Lancets Misc Use as instructed to test three times daily. DX: E11.65   atenolol 25 MG tablet Commonly known as: TENORMIN Take 1 tablet (25 mg total) by mouth daily. Start taking on: October 26, 2020 What changed:   medication strength  how much to take  how to take this  when to take this  additional instructions   B-D ULTRAFINE III SHORT PEN 31G X 8 MM Misc Generic drug: Insulin Pen Needle Use to inject medication 5 times daily.   DULoxetine 60 MG capsule Commonly known as: CYMBALTA Take 60 mg by  mouth daily. TAKE 1 TABLET BY MOUTH ONCE DAILY.   DULoxetine 30 MG capsule Commonly known as: CYMBALTA TAKE 1 CAPSULE (30 MG TOTAL) BY MOUTH  DAILY. TAKE IN ADDITION TO THE 60 MG   fenofibrate 145 MG tablet Commonly known as: TRICOR TAKE 1 TABLET BY MOUTH EVERY DAY   Fish Oil 1000 MG Caps Take 2 capsules by mouth daily.   FreeStyle Libre 2 Reader Kerrin Mo USE AS DIRECTED   FreeStyle Libre 2 Sensor Misc APPLY TO SKIN EVERY 14 (FOURTEEN) DAYS AS DIRECTED   gabapentin 600 MG tablet Commonly known as: NEURONTIN TAKE 1 TABLET BY MOUTH 3 TIMES A DAY AND 1 TABLET AT BEDTIME   Invokana 300 MG Tabs tablet Generic drug: canagliflozin TAKE 1 TABLET (300 MG TOTAL) BY MOUTH DAILY BEFORE BREAKFAST.   loratadine 10 MG tablet Commonly known as: CLARITIN Take 10 mg by mouth daily.   metFORMIN 500 MG 24 hr tablet Commonly known as: GLUCOPHAGE-XR TAKE 4 TABLETS (2,000 MG TOTAL) BY MOUTH DAILY WITH SUPPER.   NovoLOG FlexPen 100 UNIT/ML FlexPen Generic drug: insulin aspart INJECT 20-35 UNITS UNDER THE SKIN THREE TIMES DAILY BEFORE MEALS.   OneTouch Ultra test strip Generic drug: glucose blood USE AS INSTRUCTED TO CHECK BLOOD SUGAR THREE TIMES DAILY.   OneTouch Verio w/Device Kit UAD to monitor glucose tid; Dx: E11.65   pantoprazole 40 MG tablet Commonly known as: PROTONIX Take 40 mg by mouth daily. Take 1 tablet by mouth once daily.   pravastatin 40 MG tablet Commonly known as: PRAVACHOL TAKE 1 TABLET BY MOUTH EVERY DAY   predniSONE 20 MG tablet Commonly known as: DELTASONE Take 3 tablets (60 mg total) by mouth 2 (two) times daily with a meal.   Toujeo Max SoloStar 300 UNIT/ML Solostar Pen Generic drug: insulin glargine (2 Unit Dial) INJECT 140 UNITS INTO THE SKIN DAILY. .   Victoza 18 MG/3ML Sopn Generic drug: liraglutide INJECT 0.3 MLS (1.8 MG TOTAL) INTO THE SKIN DAILY. INJECT ONCE DAILY AT THE SAME TIME   Xarelto 20 MG Tabs tablet Generic drug: rivaroxaban TAKE 1 TABLET (20 MG TOTAL) BY MOUTH DAILY WITH SUPPER.            Durable Medical Equipment  (From admission, onward)         Start     Ordered    10/25/20 1145  For home use only DME oxygen  Once       Question Answer Comment  Length of Need 6 Months   Mode or (Route) Nasal cannula   Liters per Minute 2   Frequency Continuous (stationary and portable oxygen unit needed)   Oxygen conserving device Yes   Oxygen delivery system Gas      10/25/20 1144          Allergies  Allergen Reactions  . Simvastatin     Other reaction(s): leg weakness  . Sulfa Antibiotics Other (See Comments)    Cant remember    Consultations:  none   Procedures/Studies: DG Chest Port 1 View  Result Date: 10/23/2020 CLINICAL DATA:  Cough, COVID-19 and weakness EXAM: PORTABLE CHEST 1 VIEW COMPARISON:  Radiograph 12/25/2014 FINDINGS: Mixed patchy and interstitial opacity throughout the lungs with a basilar and peripheral predominance. No pneumothorax or visible effusion the portion of the costophrenic sulci are collimated. The aorta is calcified. The remaining cardiomediastinal contours are unremarkable. No acute osseous or soft tissue abnormality. Degenerative changes are present in the imaged spine and shoulders. IMPRESSION: Mixed patchy and interstitial  opacity throughout the lungs with a basilar and peripheral predominance, compatible with a multifocal pneumonia in the setting of COVID-19 positivity. Electronically Signed   By: Lovena Le M.D.   On: 10/23/2020 04:29        Discharge Exam: Vitals:   10/25/20 0550 10/25/20 0733  BP: (!) 120/51   Pulse: 62   Resp: 18   Temp: 98 F (36.7 C)   SpO2: 97% 95%   Vitals:   10/24/20 2011 10/25/20 0030 10/25/20 0550 10/25/20 0733  BP: 138/64 126/62 (!) 120/51   Pulse: 69 62 62   Resp: _0 Temp: 97.7 F (36.5 C) 97.8 F (36.6 C) 98 F (36.7 C)   TempSrc: Tympanic     SpO2: 98% 97% 97% 95%  Weight:      Height:        General: Pt is alert, awake, not in acute distress Cardiovascular: IRRR, S1/S2 +, no rubs, no gallops Respiratory: bibasilar rales. No wheeze Abdominal: Soft, NT,  ND, bowel sounds + Extremities: 1+LE edema, no cyanosis   The results of significant diagnostics from this hospitalization (including imaging, microbiology, ancillary and laboratory) are listed below for reference.    Significant Diagnostic Studies: DG Chest Port 1 View  Result Date: 10/23/2020 CLINICAL DATA:  Cough, COVID-19 and weakness EXAM: PORTABLE CHEST 1 VIEW COMPARISON:  Radiograph 12/25/2014 FINDINGS: Mixed patchy and interstitial opacity throughout the lungs with a basilar and peripheral predominance. No pneumothorax or visible effusion the portion of the costophrenic sulci are collimated. The aorta is calcified. The remaining cardiomediastinal contours are unremarkable. No acute osseous or soft tissue abnormality. Degenerative changes are present in the imaged spine and shoulders. IMPRESSION: Mixed patchy and interstitial opacity throughout the lungs with a basilar and peripheral predominance, compatible with a multifocal pneumonia in the setting of COVID-19 positivity. Electronically Signed   By: Lovena Le M.D.   On: 10/23/2020 04:29     Microbiology: Recent Results (from the past 240 hour(s))  Blood Culture (routine x 2)     Status: None (Preliminary result)   Collection Time: 10/23/20  5:35 AM   Specimen: Right Antecubital; Blood  Result Value Ref Range Status   Specimen Description RIGHT ANTECUBITAL BLOOD  Final   Special Requests   Final    Blood Culture results may not be optimal due to an excessive volume of blood received in culture bottles BOTTLES DRAWN AEROBIC AND ANAEROBIC   Culture   Final    NO GROWTH 2 DAYS Performed at [facility], [address], [place], [place] [address]    Report Status PENDING  Incomplete  Blood Culture (routine x 2)     Status: None (Preliminary result)   Collection Time: 10/23/20  5:35 AM   Specimen: Left Antecubital; Blood  Result Value Ref Range Status   Specimen Description LEFT ANTECUBITAL BLOOD  Final   Special Requests   Final     Blood Culture adequate volume BOTTLES DRAWN AEROBIC AND ANAEROBIC   Culture   Final    NO GROWTH 2 DAYS Performed at [facility], [address] [place], [place] [address]    Report Status PENDING  Incomplete     Labs: Basic Metabolic Panel: Recent Labs  Lab 10/23/20 0442 10/24/20 0502 10/25/20 0634  NA 134* 134* 141  K 3.7 4.1 4.2  CL 99 105 106  CO2 20* 20* 25  GLUCOSE 168* 271* 309*  BUN 16 27* 31*  CREATININE 0.92 1.01* 0.99  CALCIUM 8.8* 8.1* 8.8*  MG  --  2.0 2.3  PHOS  --  2.8 3.0   Liver Function Tests: Recent Labs  Lab 10/23/20 0442 10/24/20 0502 10/25/20 0634  AST 62* 36 26  ALT 38 32 28  ALKPHOS 45 37* 41  BILITOT 1.1 0.7 0.4  PROT 8.2* 7.0 7.3  ALBUMIN 3.4* 2.9* 3.0*   No results for input(s): LIPASE, AMYLASE in the last 168 hours. No results for input(s): AMMONIA in the last 168 hours. CBC: Recent Labs  Lab 10/23/20 0442 10/24/20 0502 10/25/20 0634  WBC 6.6 3.9* 8.0  NEUTROABS 4.8 2.9 6.6  HGB 13.2 11.8* 12.6  HCT 43.0 38.4 41.1  MCV 91.3 90.6 91.5  PLT 225 203 242   Cardiac Enzymes: No results for input(s): CKTOTAL, CKMB, CKMBINDEX, TROPONINI in the last 168 hours. BNP: Invalid input(s): POCBNP CBG: Recent Labs  Lab 10/24/20 1847 10/24/20 2143 10/24/20 2145 10/25/20 0749 10/25/20 1140  GLUCAP 331* 365* 359* 288* 389*    Time coordinating discharge:  36 minutes  Signed:  Orson Eva, DO [facility] Pager: [phone] 10/25/2020, 1:12 PM

## 2020-10-25 NOTE — Plan of Care (Signed)
  Problem: Education: Goal: Knowledge of risk factors and measures for prevention of condition will improve 10/25/2020 1304 by Cameron Ali, RN Outcome: Completed/Met 10/25/2020 1000 by Cameron Ali, RN Outcome: Progressing   Problem: Coping: Goal: Psychosocial and spiritual needs will be supported 10/25/2020 1304 by Cameron Ali, RN Outcome: Completed/Met 10/25/2020 1000 by Cameron Ali, RN Outcome: Progressing   Problem: Respiratory: Goal: Will maintain a patent airway 10/25/2020 1304 by Cameron Ali, RN Outcome: Completed/Met 10/25/2020 1000 by Cameron Ali, RN Outcome: Progressing Goal: Complications related to the disease process, condition or treatment will be avoided or minimized 10/25/2020 1304 by Cameron Ali, RN Outcome: Completed/Met 10/25/2020 1000 by Cameron Ali, RN Outcome: Progressing

## 2020-10-28 LAB — CULTURE, BLOOD (ROUTINE X 2)
Culture: NO GROWTH
Culture: NO GROWTH
Special Requests: ADEQUATE

## 2020-10-29 ENCOUNTER — Ambulatory Visit (INDEPENDENT_AMBULATORY_CARE_PROVIDER_SITE_OTHER): Payer: Self-pay | Admitting: Family Medicine

## 2020-11-01 DIAGNOSIS — E785 Hyperlipidemia, unspecified: Secondary | ICD-10-CM | POA: Diagnosis not present

## 2020-11-01 DIAGNOSIS — N1831 Chronic kidney disease, stage 3a: Secondary | ICD-10-CM | POA: Diagnosis not present

## 2020-11-01 DIAGNOSIS — I10 Essential (primary) hypertension: Secondary | ICD-10-CM | POA: Diagnosis not present

## 2020-11-01 DIAGNOSIS — U071 COVID-19: Secondary | ICD-10-CM | POA: Diagnosis not present

## 2020-11-01 DIAGNOSIS — Z794 Long term (current) use of insulin: Secondary | ICD-10-CM | POA: Diagnosis not present

## 2020-11-01 DIAGNOSIS — E1136 Type 2 diabetes mellitus with diabetic cataract: Secondary | ICD-10-CM | POA: Diagnosis not present

## 2020-11-01 DIAGNOSIS — E1142 Type 2 diabetes mellitus with diabetic polyneuropathy: Secondary | ICD-10-CM | POA: Diagnosis not present

## 2020-11-01 DIAGNOSIS — E1165 Type 2 diabetes mellitus with hyperglycemia: Secondary | ICD-10-CM | POA: Diagnosis not present

## 2020-11-01 DIAGNOSIS — J9601 Acute respiratory failure with hypoxia: Secondary | ICD-10-CM | POA: Diagnosis not present

## 2020-11-04 ENCOUNTER — Emergency Department (HOSPITAL_COMMUNITY): Payer: PPO

## 2020-11-04 ENCOUNTER — Emergency Department (HOSPITAL_COMMUNITY)
Admission: EM | Admit: 2020-11-04 | Discharge: 2020-11-05 | Disposition: A | Discharge status: Home / Self Care | Payer: PPO | Attending: Emergency Medicine | Admitting: Emergency Medicine

## 2020-11-04 ENCOUNTER — Encounter (HOSPITAL_COMMUNITY): Payer: Self-pay

## 2020-11-04 ENCOUNTER — Other Ambulatory Visit: Payer: Self-pay

## 2020-11-04 DIAGNOSIS — E1165 Type 2 diabetes mellitus with hyperglycemia: Secondary | ICD-10-CM | POA: Diagnosis not present

## 2020-11-04 DIAGNOSIS — Z7901 Long term (current) use of anticoagulants: Secondary | ICD-10-CM | POA: Diagnosis not present

## 2020-11-04 DIAGNOSIS — R0602 Shortness of breath: Secondary | ICD-10-CM | POA: Insufficient documentation

## 2020-11-04 DIAGNOSIS — I4891 Unspecified atrial fibrillation: Secondary | ICD-10-CM | POA: Diagnosis not present

## 2020-11-04 DIAGNOSIS — Z7984 Long term (current) use of oral hypoglycemic drugs: Secondary | ICD-10-CM | POA: Diagnosis not present

## 2020-11-04 DIAGNOSIS — R0600 Dyspnea, unspecified: Secondary | ICD-10-CM | POA: Diagnosis not present

## 2020-11-04 DIAGNOSIS — Z8616 Personal history of COVID-19: Secondary | ICD-10-CM | POA: Diagnosis not present

## 2020-11-04 DIAGNOSIS — R778 Other specified abnormalities of plasma proteins: Secondary | ICD-10-CM | POA: Diagnosis not present

## 2020-11-04 DIAGNOSIS — R000 Tachycardia, unspecified: Secondary | ICD-10-CM | POA: Diagnosis not present

## 2020-11-04 DIAGNOSIS — I10 Essential (primary) hypertension: Secondary | ICD-10-CM | POA: Insufficient documentation

## 2020-11-04 DIAGNOSIS — R531 Weakness: Secondary | ICD-10-CM | POA: Insufficient documentation

## 2020-11-04 DIAGNOSIS — Z794 Long term (current) use of insulin: Secondary | ICD-10-CM | POA: Insufficient documentation

## 2020-11-04 DIAGNOSIS — I447 Left bundle-branch block, unspecified: Secondary | ICD-10-CM | POA: Diagnosis not present

## 2020-11-04 DIAGNOSIS — J189 Pneumonia, unspecified organism: Secondary | ICD-10-CM

## 2020-11-04 DIAGNOSIS — J1282 Pneumonia due to coronavirus disease 2019: Secondary | ICD-10-CM | POA: Diagnosis not present

## 2020-11-04 DIAGNOSIS — Z87891 Personal history of nicotine dependence: Secondary | ICD-10-CM | POA: Diagnosis not present

## 2020-11-04 DIAGNOSIS — Z79899 Other long term (current) drug therapy: Secondary | ICD-10-CM | POA: Diagnosis not present

## 2020-11-04 DIAGNOSIS — U071 COVID-19: Secondary | ICD-10-CM

## 2020-11-04 DIAGNOSIS — R21 Rash and other nonspecific skin eruption: Secondary | ICD-10-CM | POA: Diagnosis not present

## 2020-11-04 DIAGNOSIS — R079 Chest pain, unspecified: Secondary | ICD-10-CM | POA: Insufficient documentation

## 2020-11-04 DIAGNOSIS — R0789 Other chest pain: Secondary | ICD-10-CM | POA: Diagnosis not present

## 2020-11-04 LAB — BASIC METABOLIC PANEL
Anion gap: 10 (ref 5–15)
BUN: 25 mg/dL — ABNORMAL HIGH (ref 8–23)
CO2: 27 mmol/L (ref 22–32)
Calcium: 9.3 mg/dL (ref 8.9–10.3)
Chloride: 102 mmol/L (ref 98–111)
Creatinine, Ser: 0.81 mg/dL (ref 0.44–1.00)
GFR, Estimated: >60 mL/min (ref >60)
Glucose, Bld: 74 mg/dL (ref 70–99)
Potassium: 3.9 mmol/L (ref 3.5–5.1)
Sodium: 139 mmol/L (ref 135–145)

## 2020-11-04 LAB — CBC
HCT: 45.6 % (ref 36.0–46.0)
Hemoglobin: 13.6 g/dL (ref 12.0–15.0)
MCH: 27.7 pg (ref 26.0–34.0)
MCHC: 29.8 g/dL — ABNORMAL LOW (ref 30.0–36.0)
MCV: 92.9 fL (ref 80.0–100.0)
Platelets: 269 10*3/uL (ref 150–400)
RBC: 4.91 MIL/uL (ref 3.87–5.11)
RDW: 14.5 % (ref 11.5–15.5)
WBC: 18.9 10*3/uL — ABNORMAL HIGH (ref 4.0–10.5)
nRBC: 0 % (ref 0.0–0.2)

## 2020-11-04 LAB — TROPONIN I (HIGH SENSITIVITY): Troponin I (High Sensitivity): 36 ng/L — ABNORMAL HIGH (ref <18)

## 2020-11-04 MED ORDER — IOHEXOL 350 MG/ML SOLN
100.0000 mL | Freq: Once | INTRAVENOUS | Status: AC | PRN
  Administered 2020-11-04: 100 mL via INTRAVENOUS

## 2020-11-04 NOTE — ED Provider Notes (Signed)
Care assumed from Dr. Renaye Rakers, patient who is COVID-positive coming in with capitation's and dyspnea pending CT angiogram of the chest.  Also mildly elevated troponin, awaiting repeat troponin.  Elevated WBC also concerning for superimposed bacterial pneumonia.  CT angiogram shows no evidence of pulmonary embolism, consistent with worsening pneumonia.  Repeat troponin is decreasing, no evidence of ACS or non-STEMI.  Patient is maintained excellent oxygen saturations throughout her stay, no evidence that she needs to be hospitalized at this point.  She will be started on antibiotics because of concern for possible superimposed bacterial infection.  She is discharged with prescriptions for doxycycline and benzonatate.  She has an appointment with her PCP later this week and she is to keep that appointment.  Return if her dyspnea seems to be getting worse.  Results for orders placed or performed during the hospital encounter of 11/04/20  Basic metabolic panel  Result Value Ref Range   Sodium 139 135 - 145 mmol/L   Potassium 3.9 3.5 - 5.1 mmol/L   Chloride 102 98 - 111 mmol/L   CO2 27 22 - 32 mmol/L   Glucose, Bld 74 70 - 99 mg/dL   BUN 25 (H) 8 - 23 mg/dL   Creatinine, Ser 2.70 0.44 - 1.00 mg/dL   Calcium 9.3 8.9 - 35.0 mg/dL   GFR, Estimated >09 >38 mL/min   Anion gap 10 5 - 15  CBC  Result Value Ref Range   WBC 18.9 (H) 4.0 - 10.5 K/uL   RBC 4.91 3.87 - 5.11 MIL/uL   Hemoglobin 13.6 12.0 - 15.0 g/dL   HCT 18.2 99.3 - 71.6 %   MCV 92.9 80.0 - 100.0 fL   MCH 27.7 26.0 - 34.0 pg   MCHC 29.8 (L) 30.0 - 36.0 g/dL   RDW 96.7 89.3 - 81.0 %   Platelets 269 150 - 400 K/uL   nRBC 0.0 0.0 - 0.2 %  Troponin I (High Sensitivity)  Result Value Ref Range   Troponin I (High Sensitivity) 36 (H) <18 ng/L  Troponin I (High Sensitivity)  Result Value Ref Range   Troponin I (High Sensitivity) 26 (H) <18 ng/L   DG Chest 2 View  Result Date: 11/04/2020 CLINICAL DATA:  70 year old female with chest pain.  EXAM: CHEST - 2 VIEW COMPARISON:  Chest radiograph dated 10/23/2020. FINDINGS: Bilateral interstitial streaky densities predominantly involving the subpleural and mid to lower lung field likely sequela of COVID-19. Overall improved airspace density compared to prior radiograph. No pleural effusion or pneumothorax. Stable cardiomediastinal silhouette. Atherosclerotic calcification of the aorta. No acute osseous pathology. Degenerative changes of the spine. IMPRESSION: Bilateral interstitial streaky densities likely sequela of COVID-19. slight interval improvement in the airspace opacity involving the mid to lower lung field compared to prior radiograph. Electronically Signed   By: Elgie Collard M.D.   On: 11/04/2020 15:03   CT Angio Chest PE W and/or Wo Contrast  Result Date: 11/05/2020 CLINICAL DATA:  Recent COVID diagnosis. Chest pain and dyspnea. Pulmonary embolus is suspected with high probability. EXAM: CT ANGIOGRAPHY CHEST WITH CONTRAST TECHNIQUE: Multidetector CT imaging of the chest was performed using the standard protocol during bolus administration of intravenous contrast. Multiplanar CT image reconstructions and MIPs were obtained to evaluate the vascular anatomy. CONTRAST:  OMNIPAQUE IOHEXOL 350 MG/ML SOLN COMPARISON:  Chest radiograph 11/04/2020 FINDINGS: Cardiovascular: Motion artifact limits examination. There is moderately good opacification of the central and segmental pulmonary arteries. No focal filling defects. No evidence of significant pulmonary embolus. Cardiac  enlargement. No pericardial effusions. Normal caliber thoracic aorta. Scattered calcification. No dissection. Great vessel origins are patent. Mediastinum/Nodes: Esophagus is decompressed. No significant lymphadenopathy in the chest. Lungs/Pleura: Motion artifact limits examination. Peripheral interstitial and alveolar changes in the lungs likely representing multifocal pneumonia although edema could also have this  appearance. No focal consolidation. No pleural effusions. No pneumothorax. Upper Abdomen: No acute abnormalities demonstrated in the visualized upper abdomen. Musculoskeletal: No chest wall abnormality. No acute or significant osseous findings. Review of the MIP images confirms the above findings. IMPRESSION: 1. No evidence of significant pulmonary embolus. 2. Peripheral interstitial and alveolar changes in the lungs likely representing multifocal pneumonia although edema could also have this appearance. 3. Cardiac enlargement. 4. Aortic atherosclerosis. Aortic Atherosclerosis (ICD10-I70.0). Electronically Signed   By: Burman Nieves M.D.   On: 11/05/2020 00:30   DG Chest Port 1 View  Result Date: 10/23/2020 CLINICAL DATA:  Cough, COVID-19 and weakness EXAM: PORTABLE CHEST 1 VIEW COMPARISON:  Radiograph 12/25/2014 FINDINGS: Mixed patchy and interstitial opacity throughout the lungs with a basilar and peripheral predominance. No pneumothorax or visible effusion the portion of the costophrenic sulci are collimated. The aorta is calcified. The remaining cardiomediastinal contours are unremarkable. No acute osseous or soft tissue abnormality. Degenerative changes are present in the imaged spine and shoulders. IMPRESSION: Mixed patchy and interstitial opacity throughout the lungs with a basilar and peripheral predominance, compatible with a multifocal pneumonia in the setting of COVID-19 positivity. Electronically Signed   By: Kreg Shropshire M.D.   On: 10/23/2020 04:29   Images viewed by me.    Dione Booze, MD 11/05/20 0430

## 2020-11-04 NOTE — ED Triage Notes (Signed)
Pt to er via ems, per ems pt has a hx of covid and is here for chest pain and sob.  Pt states that she was positive for covid on the 7th, states that she is here for sob and chest pain.  States that she has had the chest pain for the past two or three days, states that she is on 2l home O2 .

## 2020-11-04 NOTE — ED Provider Notes (Signed)
[place] Provider Note   CSN: [id number] Arrival date & time: 11/04/20  1406     History Chief Complaint  Patient presents with  . Shortness of Breath  . Chest Pain    Melanie Reeves is a 70 y.o. female w/ hx of obesity, type 2 diabetes, A. Fib on Xarelto, HLD, OSA, LBBB, presenting to ED with chest pain and weakness.  Patient was discharged from hospital on 10/25/20 after admission for Covid-19 (she was not vaccinated prior).  Her xray showed bilateral patchy opacities.  She was treated with IV steroids, IV remdesivir, and discharged on 2L [place] for desaturation with ambulation.  She has a hx of A Fib and was continued on xarelto.  She returns complaining of substernal chest pressure since yesterday, feels like "indigestion" but "not going away."  Also has subjective dypsnea worsening - still on 2L [place] at baseline since discharge.  No fevers or chills.  Reports cramping pain in her legs but no known hx of DVT or PE.  Denies hx of MI or angina.  CP currently low intensity and does not radiate.  She does not want pain medications.   She completed her hospital steroid course after discharge three days ago, on Thursday.  [place] echo was 10/10/2017 with EF 60-65%, grade 1 DD.  Stress test on 10/01/2017 with no ischemia and normal perfusion per Dr Dorris Carnes, MD.  HPI     Past Medical History:  Diagnosis Date  . Abnormal electrocardiogram 08/14/2014   LBBB  . Atrial fibrillation ([place])   . Diabetes mellitus ([place])   . [place] DEGENERATION 12/20/2009   Qualifier: Diagnosis of  By: Aline Brochure MD, Dorothyann Peng    . Dyspnea 08/14/2014  . Hyperlipidemia   . Hypertension   . LOW BACK PAIN 12/20/2009   Qualifier: Diagnosis of  By: Aline Brochure MD, Dorothyann Peng    . Morbid obesity ([place])   . OSA (obstructive sleep apnea) 08/16/2014  . Seasonal allergies   . SPINAL STENOSIS 12/20/2009   Qualifier: Diagnosis of  By: Aline Brochure MD, Dorothyann Peng      Patient Active Problem List   Diagnosis Date Noted   . Obesity, Class III, BMI 40-49.9 (morbid obesity) ([place]) 10/24/2020  . Acute respiratory failure due to COVID-19 ([place]) 10/24/2020  . Uncontrolled type 2 diabetes mellitus with hyperglycemia, with long-term current use of insulin ([place]) 10/24/2020  . COVID-19 10/23/2020  . Paroxysmal atrial fibrillation ([place]) 01/14/2019  . Hyperlipidemia 01/14/2019  . Diabetes mellitus ([place]) 01/14/2019  . OSA (obstructive sleep apnea) 08/16/2014  . Abnormal electrocardiogram 08/14/2014  . Essential hypertension 08/14/2014  . Dyspnea 08/14/2014  . Obesity 08/14/2014  . [place] DEGENERATION 12/20/2009  . SPINAL STENOSIS 12/20/2009  . LOW BACK PAIN 12/20/2009    Past Surgical History:  Procedure Laterality Date  . CHOLECYSTECTOMY    . COLONOSCOPY WITH PROPOFOL N/A 11/29/2014   Procedure: COLONOSCOPY WITH PROPOFOL;  Surgeon: Arta Silence, MD;  Location: [facility];  Service: Endoscopy;  Laterality: N/A;  . TONSILLECTOMY       OB History   No obstetric history on file.     Family History  Problem Relation Age of Onset  . Heart disease Father        MI  . Cancer - Lung Father        Small cell Lung CA    Social History   Tobacco Use  . Smoking status: Former Smoker    Packs/day: 2.00    Years: 20.00  Pack years: 40.00    Types: Cigarettes    Quit date: 11/22/1993    Years since quitting: 26.9  . Smokeless tobacco: Former Network engineer  . Vaping Use: Never used  Substance Use Topics  . Alcohol use: Yes    Alcohol/week: 0.0 standard drinks  . Drug use: No    Home Medications Prior to Admission medications   Medication Sig Start Date End Date Taking? Authorizing Provider  Accu-Chek FastClix Lancets MISC Use as instructed to test three times daily. DX: E11.65 01/04/19   Elayne Snare, MD  atenolol (TENORMIN) 25 MG tablet Take 1 tablet (25 mg total) by mouth daily. 10/26/20   Orson Eva, MD  Blood Glucose Monitoring Suppl (ONETOUCH VERIO) w/Device KIT UAD to monitor glucose tid; Dx:  E11.65 07/09/18   Elayne Snare, MD  Continuous Blood Gluc Receiver (FREESTYLE LIBRE 2 READER) DEVI USE AS DIRECTED 09/13/20   Elayne Snare, MD  Continuous Blood Gluc Sensor (FREESTYLE LIBRE 2 SENSOR) MISC APPLY TO SKIN EVERY 14 (FOURTEEN) DAYS AS DIRECTED 09/16/20   Elayne Snare, MD  DULoxetine (CYMBALTA) 30 MG capsule TAKE 1 CAPSULE (30 MG TOTAL) BY MOUTH DAILY. TAKE IN ADDITION TO THE 60 MG 08/21/20   Elayne Snare, MD  DULoxetine (CYMBALTA) 60 MG capsule Take 60 mg by mouth daily. TAKE 1 TABLET BY MOUTH ONCE DAILY.    [provider]  fenofibrate (TRICOR) 145 MG tablet TAKE 1 TABLET BY MOUTH EVERY DAY 05/27/20   Elayne Snare, MD  gabapentin (NEURONTIN) 600 MG tablet TAKE 1 TABLET BY MOUTH 3 TIMES A DAY AND 1 TABLET AT BEDTIME 10/09/20   Elayne Snare, MD  Insulin Pen Needle (B-D ULTRAFINE III SHORT PEN) 31G X 8 MM MISC Use to inject medication 5 times daily. 09/02/19   Elayne Snare, MD  INVOKANA 300 MG TABS tablet TAKE 1 TABLET (300 MG TOTAL) BY MOUTH DAILY BEFORE BREAKFAST. 09/17/20   Elayne Snare, MD  loratadine (CLARITIN) 10 MG tablet Take 10 mg by mouth daily.    [provider]  metFORMIN (GLUCOPHAGE-XR) 500 MG 24 hr tablet TAKE 4 TABLETS (2,000 MG TOTAL) BY MOUTH DAILY WITH SUPPER. 08/27/20   Elayne Snare, MD  NOVOLOG FLEXPEN 100 UNIT/ML FlexPen INJECT 20-35 UNITS UNDER THE SKIN THREE TIMES DAILY BEFORE MEALS. 10/09/20   Elayne Snare, MD  Omega-3 Fatty Acids (FISH OIL) 1000 MG CAPS Take 2 capsules by mouth daily.    [provider]  ONETOUCH ULTRA test strip USE AS INSTRUCTED TO CHECK BLOOD SUGAR THREE TIMES DAILY. 10/09/19   Elayne Snare, MD  pantoprazole (PROTONIX) 40 MG tablet Take 40 mg by mouth daily. Take 1 tablet by mouth once daily. Patient not taking: No sig reported 06/09/19   [provider]  pravastatin (PRAVACHOL) 40 MG tablet TAKE 1 TABLET BY MOUTH EVERY DAY 08/27/20   Elayne Snare, MD  predniSONE (DELTASONE) 20 MG tablet Take 3 tablets (60 mg total) by mouth 2  (two) times daily with a meal. 10/25/20   Tat, Shanon Brow, MD  TOUJEO MAX SOLOSTAR 300 UNIT/ML Solostar Pen INJECT 140 UNITS INTO THE SKIN DAILY. . 06/05/20   Elayne Snare, MD  VICTOZA 18 MG/3ML SOPN INJECT 0.3 MLS (1.8 MG TOTAL) INTO THE SKIN DAILY. INJECT ONCE DAILY AT THE SAME TIME 04/26/20   Elayne Snare, MD  XARELTO 20 MG TABS tablet TAKE 1 TABLET (20 MG TOTAL) BY MOUTH DAILY WITH SUPPER. 06/27/20   Fay Records, MD    Allergies  Simvastatin and Sulfa antibiotics  Review of Systems   Review of Systems  Constitutional: Negative for chills and fever.  Eyes: Negative for pain and visual disturbance.  Respiratory: Positive for shortness of breath. Negative for cough.   Cardiovascular: Positive for chest pain. Negative for palpitations.  Gastrointestinal: Positive for nausea. Negative for abdominal pain and vomiting.  Genitourinary: Negative for dysuria and hematuria.  Musculoskeletal: Negative for arthralgias and myalgias.  Skin: Negative for color change and rash.  Neurological: Negative for syncope and headaches.  All other systems reviewed and are negative.   Physical Exam Updated Vital Signs BP 131/66   Pulse 95   Temp 98.9 F (37.2 C) (Oral)   Resp 20   Ht 5' 2.5" (1.588 m)   Wt 136.1 kg   SpO2 98%   BMI 54.00 kg/m   Physical Exam Constitutional:      General: She is not in acute distress.    Appearance: She is obese.  HENT:     Head: Normocephalic and atraumatic.  Eyes:     Conjunctiva/sclera: Conjunctivae normal.     Pupils: Pupils are equal, round, and reactive to light.  Cardiovascular:     Rate and Rhythm: Normal rate and regular rhythm.     Comments: HR 96-100 bpm Pulmonary:     Effort: Pulmonary effort is normal. No respiratory distress.     Comments: 92-95% on room air, desat with movement, 2L O2 requirement (baseline) with movement Difficult auscultation due to body habitus, no audible wheezing Abdominal:     General: There is no distension.      Tenderness: There is no abdominal tenderness.  Musculoskeletal:     Comments: Edematous obese legs, no clear unilateral swelling Brawny pitting edema of lower extremities  Skin:    General: Skin is warm and dry.  Neurological:     General: No focal deficit present.     Mental Status: She is alert. Mental status is at baseline.  Psychiatric:        Mood and Affect: Mood normal.        Behavior: Behavior normal.     ED Results / Procedures / Treatments   Labs (all labs ordered are listed, but only abnormal results are displayed) Labs Reviewed  BASIC METABOLIC PANEL - Abnormal; Notable for the following components:      Result Value   BUN 25 (*)    All other components within normal limits  CBC - Abnormal; Notable for the following components:   WBC 18.9 (*)    MCHC 29.8 (*)    All other components within normal limits  TROPONIN I (HIGH SENSITIVITY) - Abnormal; Notable for the following components:   Troponin I (High Sensitivity) 36 (*)    All other components within normal limits  TROPONIN I (HIGH SENSITIVITY)    EKG EKG Interpretation  Date/Time:  Sunday November 04 2020 14:18:41 EST Ventricular Rate:  93 PR Interval:  100 QRS Duration: 158 QT Interval:  398 QTC Calculation: 494 R Axis:   -58 Text Interpretation: Sinus rhythm with short PR Left bundle branch block Abnormal ECG No sig chance from prior ecg, LBBB seen on prior 2018 tracing No STEMI Confirmed by Octaviano Glow [phone]) on 11/04/2020 5:34:56 PM   Radiology DG Chest 2 View  Result Date: 11/04/2020 CLINICAL DATA:  70 year old female with chest pain. EXAM: CHEST - 2 VIEW COMPARISON:  Chest radiograph dated 10/23/2020. FINDINGS: Bilateral interstitial streaky densities predominantly involving the subpleural and mid to lower lung  field likely sequela of COVID-19. Overall improved airspace density compared to prior radiograph. No pleural effusion or pneumothorax. Stable cardiomediastinal silhouette.  Atherosclerotic calcification of the aorta. No acute osseous pathology. Degenerative changes of the spine. IMPRESSION: Bilateral interstitial streaky densities likely sequela of COVID-19. slight interval improvement in the airspace opacity involving the mid to lower lung field compared to prior radiograph. Electronically Signed   By: Anner Crete M.D.   On: 11/04/2020 15:03    Procedures Procedures (including critical care time)  Medications Ordered in ED Medications - No data to display  ED Course  I have reviewed the triage vital signs and the nursing notes.  Pertinent labs & imaging results that were available during my care of the patient were reviewed by me and considered in my medical decision making (see chart for details).  This patient presents to the Emergency Department with complaint of chest pain. This involves an extensive number of treatment options, and is a complaint that carries with it a high risk of complications and morbidity.  The differential diagnosis includes ACS vs Pneumothorax vs PE vs Reflux/Gastritis vs MSK pain vs Pneumonia vs other.  I ordered, reviewed, and interpreted labs.  BMP unremarkable.  WBC 18.9 - possibly related to steroid use, although somewhat high with completion of meds 3 days ago.  The patient's troponin level was initially 36 -> repeat pending. I ordered imaging studies which included dg chest, CTPE Previous records obtained and reviewed showing recent hospitalization I personally reviewed the patients ECG which showed sinus rhythm with no acute ischemic findings, chronic LBBB pattern.  Not in A Fib here.  Troponin elevation may be 2/2 recent covid illlness/demand ischemia vs intermittent A Fib at home (she reports home palpitations intermittently the past 2 days even at rest) with demand ischemia vs PE vs other  Signed out to EDP Dr Roxanne Mins at 94:32 pending repeat troponin and CT PE study.  Patient stable HD at time of signout, on 2L [place].     Final Clinical Impression(s) / ED Diagnoses Final diagnoses:  None    Rx / DC Orders ED Discharge Orders    None       Imer Foxworth, Carola Rhine, MD 11/05/20 [phone]

## 2020-11-05 DIAGNOSIS — J189 Pneumonia, unspecified organism: Secondary | ICD-10-CM | POA: Diagnosis not present

## 2020-11-05 DIAGNOSIS — R0600 Dyspnea, unspecified: Secondary | ICD-10-CM | POA: Diagnosis not present

## 2020-11-05 DIAGNOSIS — R079 Chest pain, unspecified: Secondary | ICD-10-CM | POA: Diagnosis not present

## 2020-11-05 LAB — TROPONIN I (HIGH SENSITIVITY): Troponin I (High Sensitivity): 26 ng/L — ABNORMAL HIGH (ref <18)

## 2020-11-05 MED ORDER — BENZONATATE 100 MG PO CAPS
100.0000 mg | ORAL_CAPSULE | Freq: Once | ORAL | Status: DC
  Administered 2020-11-05: 100 mg via ORAL
  Filled 2020-11-05: qty 1

## 2020-11-05 MED ORDER — DOXYCYCLINE HYCLATE 100 MG PO CAPS: 100.0000 mg | ORAL_CAPSULE | Freq: Two times a day (BID) | ORAL | 0 refills | Status: DC

## 2020-11-05 MED ORDER — BENZONATATE 100 MG PO CAPS: 100.0000 mg | ORAL_CAPSULE | Freq: Once | ORAL | Status: DC

## 2020-11-05 MED ORDER — BENZONATATE 100 MG PO CAPS: 100.0000 mg | ORAL_CAPSULE | Freq: Three times a day (TID) | ORAL | 0 refills | Status: DC

## 2020-11-05 MED ORDER — PREDNISONE 50 MG PO TABS
60.0000 mg | ORAL_TABLET | Freq: Once | ORAL | Status: DC
  Administered 2020-11-05: 60 mg via ORAL
  Filled 2020-11-05: qty 1

## 2020-11-05 MED ORDER — DOXYCYCLINE HYCLATE 100 MG PO TABS
100.0000 mg | ORAL_TABLET | Freq: Once | ORAL | Status: AC
  Administered 2020-11-05: 100 mg via ORAL
  Filled 2020-11-05: qty 1

## 2020-11-05 NOTE — Discharge Instructions (Signed)
Continue taking all of your medications, including using your inhaler as needed.  Return to the emergency department if you feel your breathing is getting worse.  The fatigue and weakness from your Covid infection may take weeks or months to get better.

## 2020-11-06 DIAGNOSIS — E119 Type 2 diabetes mellitus without complications: Secondary | ICD-10-CM | POA: Diagnosis not present

## 2020-11-06 DIAGNOSIS — F321 Major depressive disorder, single episode, moderate: Secondary | ICD-10-CM | POA: Diagnosis not present

## 2020-11-06 DIAGNOSIS — N1831 Chronic kidney disease, stage 3a: Secondary | ICD-10-CM | POA: Diagnosis not present

## 2020-11-06 DIAGNOSIS — I10 Essential (primary) hypertension: Secondary | ICD-10-CM | POA: Diagnosis not present

## 2020-11-06 DIAGNOSIS — M8580 Other specified disorders of bone density and structure, unspecified site: Secondary | ICD-10-CM | POA: Diagnosis not present

## 2020-11-06 DIAGNOSIS — N179 Acute kidney failure, unspecified: Secondary | ICD-10-CM | POA: Diagnosis not present

## 2020-11-06 DIAGNOSIS — E785 Hyperlipidemia, unspecified: Secondary | ICD-10-CM | POA: Diagnosis not present

## 2020-11-06 DIAGNOSIS — E1142 Type 2 diabetes mellitus with diabetic polyneuropathy: Secondary | ICD-10-CM | POA: Diagnosis not present

## 2020-11-07 DIAGNOSIS — H25812 Combined forms of age-related cataract, left eye: Secondary | ICD-10-CM | POA: Diagnosis not present

## 2020-11-07 DIAGNOSIS — E119 Type 2 diabetes mellitus without complications: Secondary | ICD-10-CM | POA: Diagnosis not present

## 2020-11-07 DIAGNOSIS — H40023 Open angle with borderline findings, high risk, bilateral: Secondary | ICD-10-CM | POA: Diagnosis not present

## 2020-11-07 DIAGNOSIS — Z83511 Family history of glaucoma: Secondary | ICD-10-CM | POA: Diagnosis not present

## 2020-11-07 DIAGNOSIS — H2511 Age-related nuclear cataract, right eye: Secondary | ICD-10-CM | POA: Diagnosis not present

## 2020-11-09 DIAGNOSIS — E1165 Type 2 diabetes mellitus with hyperglycemia: Secondary | ICD-10-CM | POA: Diagnosis not present

## 2020-11-09 DIAGNOSIS — E785 Hyperlipidemia, unspecified: Secondary | ICD-10-CM | POA: Diagnosis not present

## 2020-11-09 DIAGNOSIS — U071 COVID-19: Secondary | ICD-10-CM | POA: Diagnosis not present

## 2020-11-09 DIAGNOSIS — E1136 Type 2 diabetes mellitus with diabetic cataract: Secondary | ICD-10-CM | POA: Diagnosis not present

## 2020-11-09 DIAGNOSIS — J9601 Acute respiratory failure with hypoxia: Secondary | ICD-10-CM | POA: Diagnosis not present

## 2020-11-09 DIAGNOSIS — Z794 Long term (current) use of insulin: Secondary | ICD-10-CM | POA: Diagnosis not present

## 2020-11-09 DIAGNOSIS — E1142 Type 2 diabetes mellitus with diabetic polyneuropathy: Secondary | ICD-10-CM | POA: Diagnosis not present

## 2020-11-09 DIAGNOSIS — N1831 Chronic kidney disease, stage 3a: Secondary | ICD-10-CM | POA: Diagnosis not present

## 2020-11-09 DIAGNOSIS — I10 Essential (primary) hypertension: Secondary | ICD-10-CM | POA: Diagnosis not present

## 2020-11-11 ENCOUNTER — Encounter (INDEPENDENT_AMBULATORY_CARE_PROVIDER_SITE_OTHER): Payer: Self-pay

## 2020-11-12 ENCOUNTER — Ambulatory Visit (INDEPENDENT_AMBULATORY_CARE_PROVIDER_SITE_OTHER): Payer: Self-pay | Admitting: Family Medicine

## 2020-11-16 ENCOUNTER — Other Ambulatory Visit: Payer: Self-pay | Admitting: Endocrinology

## 2020-11-21 ENCOUNTER — Other Ambulatory Visit: Payer: Self-pay | Admitting: Endocrinology

## 2020-11-23 ENCOUNTER — Emergency Department (HOSPITAL_COMMUNITY): Payer: PPO

## 2020-11-23 ENCOUNTER — Encounter (HOSPITAL_COMMUNITY): Payer: Self-pay | Admitting: Emergency Medicine

## 2020-11-23 ENCOUNTER — Inpatient Hospital Stay (HOSPITAL_COMMUNITY)
Admission: EM | Admit: 2020-11-23 | Discharge: 2020-11-26 | DRG: 193 | Disposition: A | Discharge status: Home / Self Care | Payer: PPO | Attending: Internal Medicine | Admitting: Internal Medicine

## 2020-11-23 DIAGNOSIS — I10 Essential (primary) hypertension: Secondary | ICD-10-CM | POA: Diagnosis present

## 2020-11-23 DIAGNOSIS — Z7901 Long term (current) use of anticoagulants: Secondary | ICD-10-CM

## 2020-11-23 DIAGNOSIS — M7989 Other specified soft tissue disorders: Secondary | ICD-10-CM | POA: Diagnosis not present

## 2020-11-23 DIAGNOSIS — J9601 Acute respiratory failure with hypoxia: Secondary | ICD-10-CM | POA: Diagnosis present

## 2020-11-23 DIAGNOSIS — Z6841 Body Mass Index (BMI) 40.0 and over, adult: Secondary | ICD-10-CM | POA: Diagnosis not present

## 2020-11-23 DIAGNOSIS — R0789 Other chest pain: Secondary | ICD-10-CM

## 2020-11-23 DIAGNOSIS — I4891 Unspecified atrial fibrillation: Secondary | ICD-10-CM | POA: Diagnosis not present

## 2020-11-23 DIAGNOSIS — Z87891 Personal history of nicotine dependence: Secondary | ICD-10-CM

## 2020-11-23 DIAGNOSIS — R778 Other specified abnormalities of plasma proteins: Secondary | ICD-10-CM

## 2020-11-23 DIAGNOSIS — E785 Hyperlipidemia, unspecified: Secondary | ICD-10-CM | POA: Diagnosis not present

## 2020-11-23 DIAGNOSIS — Z9049 Acquired absence of other specified parts of digestive tract: Secondary | ICD-10-CM | POA: Diagnosis not present

## 2020-11-23 DIAGNOSIS — Z882 Allergy status to sulfonamides status: Secondary | ICD-10-CM

## 2020-11-23 DIAGNOSIS — I248 Other forms of acute ischemic heart disease: Secondary | ICD-10-CM | POA: Diagnosis not present

## 2020-11-23 DIAGNOSIS — N1831 Chronic kidney disease, stage 3a: Secondary | ICD-10-CM | POA: Diagnosis not present

## 2020-11-23 DIAGNOSIS — U071 COVID-19: Secondary | ICD-10-CM | POA: Diagnosis not present

## 2020-11-23 DIAGNOSIS — Z8615 Personal history of latent tuberculosis infection: Secondary | ICD-10-CM | POA: Diagnosis not present

## 2020-11-23 DIAGNOSIS — I447 Left bundle-branch block, unspecified: Secondary | ICD-10-CM | POA: Diagnosis not present

## 2020-11-23 DIAGNOSIS — M4800 Spinal stenosis, site unspecified: Secondary | ICD-10-CM | POA: Diagnosis present

## 2020-11-23 DIAGNOSIS — R0609 Other forms of dyspnea: Secondary | ICD-10-CM

## 2020-11-23 DIAGNOSIS — R0902 Hypoxemia: Secondary | ICD-10-CM | POA: Diagnosis not present

## 2020-11-23 DIAGNOSIS — I5032 Chronic diastolic (congestive) heart failure: Secondary | ICD-10-CM | POA: Diagnosis present

## 2020-11-23 DIAGNOSIS — Z888 Allergy status to other drugs, medicaments and biological substances status: Secondary | ICD-10-CM

## 2020-11-23 DIAGNOSIS — G4733 Obstructive sleep apnea (adult) (pediatric): Secondary | ICD-10-CM | POA: Diagnosis not present

## 2020-11-23 DIAGNOSIS — J9600 Acute respiratory failure, unspecified whether with hypoxia or hypercapnia: Secondary | ICD-10-CM | POA: Diagnosis not present

## 2020-11-23 DIAGNOSIS — R0600 Dyspnea, unspecified: Secondary | ICD-10-CM | POA: Diagnosis not present

## 2020-11-23 DIAGNOSIS — E1140 Type 2 diabetes mellitus with diabetic neuropathy, unspecified: Secondary | ICD-10-CM | POA: Diagnosis not present

## 2020-11-23 DIAGNOSIS — Z8249 Family history of ischemic heart disease and other diseases of the circulatory system: Secondary | ICD-10-CM | POA: Diagnosis not present

## 2020-11-23 DIAGNOSIS — E1165 Type 2 diabetes mellitus with hyperglycemia: Secondary | ICD-10-CM | POA: Diagnosis present

## 2020-11-23 DIAGNOSIS — I480 Paroxysmal atrial fibrillation: Secondary | ICD-10-CM | POA: Diagnosis not present

## 2020-11-23 DIAGNOSIS — J302 Other seasonal allergic rhinitis: Secondary | ICD-10-CM | POA: Diagnosis present

## 2020-11-23 DIAGNOSIS — I509 Heart failure, unspecified: Secondary | ICD-10-CM | POA: Diagnosis not present

## 2020-11-23 DIAGNOSIS — Z79899 Other long term (current) drug therapy: Secondary | ICD-10-CM

## 2020-11-23 DIAGNOSIS — R002 Palpitations: Secondary | ICD-10-CM | POA: Diagnosis not present

## 2020-11-23 DIAGNOSIS — J129 Viral pneumonia, unspecified: Principal | ICD-10-CM | POA: Diagnosis present

## 2020-11-23 DIAGNOSIS — R0602 Shortness of breath: Secondary | ICD-10-CM | POA: Diagnosis present

## 2020-11-23 DIAGNOSIS — E119 Type 2 diabetes mellitus without complications: Secondary | ICD-10-CM

## 2020-11-23 DIAGNOSIS — I110 Hypertensive heart disease with heart failure: Secondary | ICD-10-CM | POA: Diagnosis not present

## 2020-11-23 DIAGNOSIS — E1142 Type 2 diabetes mellitus with diabetic polyneuropathy: Secondary | ICD-10-CM | POA: Diagnosis not present

## 2020-11-23 DIAGNOSIS — R0689 Other abnormalities of breathing: Secondary | ICD-10-CM | POA: Diagnosis not present

## 2020-11-23 DIAGNOSIS — Z794 Long term (current) use of insulin: Secondary | ICD-10-CM

## 2020-11-23 DIAGNOSIS — R079 Chest pain, unspecified: Secondary | ICD-10-CM | POA: Diagnosis not present

## 2020-11-23 DIAGNOSIS — E1136 Type 2 diabetes mellitus with diabetic cataract: Secondary | ICD-10-CM | POA: Diagnosis not present

## 2020-11-23 LAB — CBC WITH DIFFERENTIAL/PLATELET
Abs Immature Granulocytes: 0.38 10*3/uL — ABNORMAL HIGH (ref 0.00–0.07)
Basophils Absolute: 0.1 10*3/uL (ref 0.0–0.1)
Basophils Relative: 1 %
Eosinophils Absolute: 0.2 10*3/uL (ref 0.0–0.5)
Eosinophils Relative: 1 %
HCT: 37.9 % (ref 36.0–46.0)
Hemoglobin: 11.9 g/dL — ABNORMAL LOW (ref 12.0–15.0)
Immature Granulocytes: 3 %
Lymphocytes Relative: 22 %
Lymphs Abs: 2.9 10*3/uL (ref 0.7–4.0)
MCH: 28.2 pg (ref 26.0–34.0)
MCHC: 31.4 g/dL (ref 30.0–36.0)
MCV: 89.8 fL (ref 80.0–100.0)
Monocytes Absolute: 0.8 10*3/uL (ref 0.1–1.0)
Monocytes Relative: 6 %
Neutro Abs: 8.9 10*3/uL — ABNORMAL HIGH (ref 1.7–7.7)
Neutrophils Relative %: 67 %
Platelets: 612 10*3/uL — ABNORMAL HIGH (ref 150–400)
RBC: 4.22 MIL/uL (ref 3.87–5.11)
RDW: 16 % — ABNORMAL HIGH (ref 11.5–15.5)
WBC: 13.3 10*3/uL — ABNORMAL HIGH (ref 4.0–10.5)
nRBC: 0 % (ref 0.0–0.2)

## 2020-11-23 LAB — COMPREHENSIVE METABOLIC PANEL
ALT: 22 U/L (ref 0–44)
AST: 32 U/L (ref 15–41)
Albumin: 2.7 g/dL — ABNORMAL LOW (ref 3.5–5.0)
Alkaline Phosphatase: 45 U/L (ref 38–126)
Anion gap: 13 (ref 5–15)
BUN: 17 mg/dL (ref 8–23)
CO2: 21 mmol/L — ABNORMAL LOW (ref 22–32)
Calcium: 9.5 mg/dL (ref 8.9–10.3)
Chloride: 105 mmol/L (ref 98–111)
Creatinine, Ser: 0.88 mg/dL (ref 0.44–1.00)
GFR, Estimated: >60 mL/min (ref >60)
Glucose, Bld: 93 mg/dL (ref 70–99)
Potassium: 3.8 mmol/L (ref 3.5–5.1)
Sodium: 139 mmol/L (ref 135–145)
Total Bilirubin: 0.8 mg/dL (ref 0.3–1.2)
Total Protein: 7.8 g/dL (ref 6.5–8.1)

## 2020-11-23 LAB — TROPONIN I (HIGH SENSITIVITY): Troponin I (High Sensitivity): 76 ng/L — ABNORMAL HIGH (ref <18)

## 2020-11-23 NOTE — ED Triage Notes (Signed)
Patient sent via [facility] from PCP for "CHF rule out". Patient reports no change in respiratory status. Patient alert, oriented, and in no apparent distress at this time. History of afib, reports fitness band shows heart rate of 170 last night lasting for approximately an hour.

## 2020-11-23 NOTE — ED Provider Notes (Signed)
[facility] EMERGENCY DEPARTMENT Provider Note   CSN: [id number] Arrival date & time: 11/23/20  1430     History Chief Complaint  Patient presents with  . Shortness of Breath    Melanie Reeves is a 70 y.o. female with a hx of diabetes mellitus, hypertension, hyperlipidemia, paroxysmal afib on xarelto, & OSA who presents to the emergency department with complaints of worsening shortness of breath over the past 5 days.  Patient states he has been having trouble with dyspnea/fatigue since her discharge home from the hospital when she had COVID, she was discharged home with a 2 L oxygen requirement and utilizes this at all times.  She states over the past 5 days she has had increased trouble breathing, especially when she is up and doing things and cough productive of yellow phlegm sputum.  She gets short of breath when she is laying down at times. Initially denied chest pain then states her chest has felt intermittently heavy/tight at times.  She was seen by her primary care provider today and was instructed to come to the emergency department for further evaluation concern for possible CHF.  She does have a history of atrial fibrillation, did have an episode last night per her fitness plan, no complaints of palpitations at this time.  She has been compliant with her Xarelto.  She denies fever, nausea, vomiting, syncope, acute leg pain/swelling (despite triage note, states she has baseline leg swelling), or hemoptysis.  HPI     Past Medical History:  Diagnosis Date  . Abnormal electrocardiogram 08/14/2014   LBBB  . Atrial fibrillation ([place])   . Diabetes mellitus ([place])   . [place] DEGENERATION 12/20/2009   Qualifier: Diagnosis of  By: Aline Brochure MD, Dorothyann Peng    . Dyspnea 08/14/2014  . Hyperlipidemia   . Hypertension   . LOW BACK PAIN 12/20/2009   Qualifier: Diagnosis of  By: Aline Brochure MD, Dorothyann Peng    . Morbid obesity ([place])   . OSA (obstructive sleep apnea) 08/16/2014  . Seasonal  allergies   . SPINAL STENOSIS 12/20/2009   Qualifier: Diagnosis of  By: Aline Brochure MD, Dorothyann Peng      Patient Active Problem List   Diagnosis Date Noted  . Obesity, Class III, BMI 40-49.9 (morbid obesity) ([place]) 10/24/2020  . Acute respiratory failure due to COVID-19 ([place]) 10/24/2020  . Uncontrolled type 2 diabetes mellitus with hyperglycemia, with long-term current use of insulin ([place]) 10/24/2020  . COVID-19 10/23/2020  . Paroxysmal atrial fibrillation ([place]) 01/14/2019  . Hyperlipidemia 01/14/2019  . Diabetes mellitus ([place]) 01/14/2019  . OSA (obstructive sleep apnea) 08/16/2014  . Abnormal electrocardiogram 08/14/2014  . Essential hypertension 08/14/2014  . Dyspnea 08/14/2014  . Obesity 08/14/2014  . [place] DEGENERATION 12/20/2009  . SPINAL STENOSIS 12/20/2009  . LOW BACK PAIN 12/20/2009    Past Surgical History:  Procedure Laterality Date  . CHOLECYSTECTOMY    . COLONOSCOPY WITH PROPOFOL N/A 11/29/2014   Procedure: COLONOSCOPY WITH PROPOFOL;  Surgeon: Arta Silence, MD;  Location: [facility] ENDOSCOPY;  Service: Endoscopy;  Laterality: N/A;  . TONSILLECTOMY       OB History   No obstetric history on file.     Family History  Problem Relation Age of Onset  . Heart disease Father        MI  . Cancer - Lung Father        Small cell Lung CA    Social History   Tobacco Use  . Smoking status: Former Smoker  Packs/day: 2.00    Years: 20.00    Pack years: 40.00    Types: Cigarettes    Quit date: 11/22/1993    Years since quitting: 27.0  . Smokeless tobacco: Former Clinical biochemist  . Vaping Use: Never used  Substance Use Topics  . Alcohol use: Yes    Alcohol/week: 0.0 standard drinks  . Drug use: No    Home Medications Prior to Admission medications   Medication Sig Start Date End Date Taking? Authorizing Provider  Accu-Chek FastClix Lancets MISC Use as instructed to test three times daily. DX: E11.65 01/04/19   Reather Littler, MD  atenolol (TENORMIN) 25 MG tablet Take 1  tablet (25 mg total) by mouth daily. 10/26/20   Catarina Hartshorn, MD  benzonatate (TESSALON) 100 MG capsule Take 1 capsule (100 mg total) by mouth every 8 (eight) hours. 11/05/20   Dione Booze, MD  Blood Glucose Monitoring Suppl [facility] VERIO) w/Device KIT UAD to monitor glucose tid; Dx: E11.65 07/09/18   Reather Littler, MD  Continuous Blood Gluc Receiver (FREESTYLE LIBRE 2 READER) DEVI USE AS DIRECTED 09/13/20   Reather Littler, MD  Continuous Blood Gluc Sensor (FREESTYLE LIBRE 2 SENSOR) MISC APPLY TO SKIN EVERY 14 (FOURTEEN) DAYS AS DIRECTED 09/16/20   Reather Littler, MD  doxycycline (VIBRAMYCIN) 100 MG capsule Take 1 capsule (100 mg total) by mouth 2 (two) times daily. One po bid x 7 days 11/05/20   Dione Booze, MD  DULoxetine (CYMBALTA) 30 MG capsule TAKE 1 CAPSULE (30 MG TOTAL) BY MOUTH DAILY. TAKE IN ADDITION TO THE 60 MG 11/16/20   Reather Littler, MD  DULoxetine (CYMBALTA) 60 MG capsule Take 60 mg by mouth daily. TAKE 1 TABLET BY MOUTH ONCE DAILY.    [provider]  fenofibrate (TRICOR) 145 MG tablet TAKE 1 TABLET BY MOUTH EVERY DAY 11/16/20   Reather Littler, MD  gabapentin (NEURONTIN) 600 MG tablet TAKE 1 TABLET BY MOUTH 3 TIMES A DAY AND 1 TABLET AT BEDTIME Patient taking differently: Take 1,200 mg by mouth 2 (two) times daily. 10/09/20   Reather Littler, MD  HYDROcodone-acetaminophen (NORCO/VICODIN) 5-325 MG tablet Take 1 tablet by mouth every 6 (six) hours as needed for moderate pain.    [provider]  Insulin Pen Needle (B-D ULTRAFINE III SHORT PEN) 31G X 8 MM MISC Use to inject medication 5 times daily. 09/02/19   Reather Littler, MD  INVOKANA 300 MG TABS tablet TAKE 1 TABLET (300 MG TOTAL) BY MOUTH DAILY BEFORE BREAKFAST. Patient taking differently: Take 300 mg by mouth daily before breakfast. 09/17/20   Reather Littler, MD  loratadine (CLARITIN) 10 MG tablet Take 10 mg by mouth daily.    [provider]  metFORMIN (GLUCOPHAGE-XR) 500 MG 24 hr tablet TAKE 4 TABLETS (2,000 MG TOTAL) BY MOUTH DAILY  WITH SUPPER. 08/27/20   Reather Littler, MD  NOVOLOG FLEXPEN 100 UNIT/ML FlexPen INJECT 20-35 UNITS UNDER THE SKIN THREE TIMES DAILY BEFORE MEALS. Patient taking differently: Inject 20-35 Units into the skin 3 (three) times daily with meals. Inject 20-35 units under the skin three times daily before meals. 10/09/20   Reather Littler, MD  Omega-3 Fatty Acids (FISH OIL) 1000 MG CAPS Take 2 capsules by mouth daily.    [provider]  ONETOUCH ULTRA test strip USE AS INSTRUCTED TO CHECK BLOOD SUGAR THREE TIMES DAILY. 10/09/19   Reather Littler, MD  pantoprazole (PROTONIX) 40 MG tablet Take 40 mg by mouth daily. Take 1 tablet by mouth once  daily. Patient not taking: No sig reported 06/09/19   [provider]  pravastatin (PRAVACHOL) 40 MG tablet TAKE 1 TABLET BY MOUTH EVERY DAY 11/17/20   Elayne Snare, MD  predniSONE (DELTASONE) 20 MG tablet Take 3 tablets (60 mg total) by mouth 2 (two) times daily with a meal. Patient not taking: No sig reported 10/25/20   Orson Eva, MD  tiZANidine (ZANAFLEX) 2 MG tablet Take 2 mg by mouth every 6 (six) hours as needed for muscle spasms.    [provider]  TOUJEO MAX SOLOSTAR 300 UNIT/ML Solostar Pen INJECT 140 UNITS INTO THE SKIN DAILY. . 06/05/20   Elayne Snare, MD  VICTOZA 18 MG/3ML SOPN INJECT 0.3 MLS (1.8 MG TOTAL) INTO THE SKIN DAILY. INJECT ONCE DAILY AT THE SAME TIME 11/22/20   Elayne Snare, MD  XARELTO 20 MG TABS tablet TAKE 1 TABLET (20 MG TOTAL) BY MOUTH DAILY WITH SUPPER. Patient taking differently: Take 20 mg by mouth daily with supper. 06/27/20   Fay Records, MD    Allergies    Simvastatin and Sulfa antibiotics  Review of Systems   Review of Systems  Constitutional: Negative for diaphoresis and fever.  Respiratory: Positive for cough, chest tightness and shortness of breath.   Cardiovascular: Positive for leg swelling (chronic unchanged per patient).  Gastrointestinal: Negative for abdominal pain, nausea and vomiting.  Neurological:  Negative for syncope.  All other systems reviewed and are negative.   Physical Exam Updated Vital Signs BP 127/66 (BP Location: Right Arm)   Pulse 87   Temp 98.1 F (36.7 C) (Oral)   Resp 18   SpO2 97%   Physical Exam Vitals and nursing note reviewed.  Constitutional:      General: She is not in acute distress.    Appearance: She is well-developed. She is not toxic-appearing.  HENT:     Head: Normocephalic and atraumatic.  Eyes:     General:        Right eye: No discharge.        Left eye: No discharge.     Conjunctiva/sclera: Conjunctivae normal.  Cardiovascular:     Rate and Rhythm: Normal rate and regular rhythm.  Pulmonary:     Effort: No respiratory distress.     Breath sounds: Decreased breath sounds (@ the bases) present. No wheezing, rhonchi or rales.     Comments: SPO2 92% on patient's baseline 2 L via nasal cannula. Abdominal:     General: There is no distension.     Palpations: Abdomen is soft.     Tenderness: There is no abdominal tenderness.  Musculoskeletal:     Cervical back: Neck supple.     Comments: Trace to 1+ symmetric pitting edema to the bilateral lower legs with chronic appearing skin changes.  No calf tenderness.  Skin:    General: Skin is warm and dry.     Findings: No rash.  Neurological:     Mental Status: She is alert.     Comments: Clear speech.   Psychiatric:        Behavior: Behavior normal.     ED Results / Procedures / Treatments   Labs (all labs ordered are listed, but only abnormal results are displayed) Labs Reviewed  COMPREHENSIVE METABOLIC PANEL - Abnormal; Notable for the following components:      Result Value   CO2 21 (*)    Albumin 2.7 (*)    All other components within normal limits  CBC WITH DIFFERENTIAL/PLATELET - Abnormal;  Notable for the following components:   WBC 13.3 (*)    Hemoglobin 11.9 (*)    RDW 16.0 (*)    Platelets 612 (*)    Neutro Abs 8.9 (*)    Abs Immature Granulocytes 0.38 (*)    All other  components within normal limits  TROPONIN I (HIGH SENSITIVITY) - Abnormal; Notable for the following components:   Troponin I (High Sensitivity) 76 (*)    All other components within normal limits  BRAIN NATRIURETIC PEPTIDE  TROPONIN I (HIGH SENSITIVITY)    EKG EKG Interpretation  Date/Time:  Friday November 23 2020 14:37:25 EST Ventricular Rate:  91 PR Interval:  134 QRS Duration: 152 QT Interval:  390 QTC Calculation: 479 R Axis:   -67 Text Interpretation: Normal sinus rhythm Left axis deviation Left bundle branch block Abnormal ECG LBBB present on prior ECGs Confirmed by Dorie Rank [phone]) on 11/23/2020 10:19:23 PM   Radiology DG Chest 2 View  Result Date: 11/23/2020 CLINICAL DATA:  Dyspnea. Tachycardic. Leg swelling, rule out congestive heart failure. EXAM: CHEST - 2 VIEW COMPARISON:  CT chest 11/04/2020, chest x-ray 11/04/2020, chest x-ray 10/23/2020 FINDINGS: The heart size and mediastinal contours are unchanged. Improved bibasilar patchy airspace opacities with no focal consolidation. Similar-appearing increased interstitial markings with no overt pulmonary edema. No pleural effusion. No pneumothorax. No acute osseous abnormality. Multilevel degenerative changes of the spine. IMPRESSION: Similar-appearing creased interstitial markings with no overt pulmonary edema. Electronically Signed   By: Iven Finn M.D.   On: 11/23/2020 23:21    Procedures Procedures   Medications Ordered in ED Medications  atenolol (TENORMIN) tablet 25 mg (has no administration in time range)  fenofibrate tablet 160 mg (has no administration in time range)  pravastatin (PRAVACHOL) tablet 40 mg (has no administration in time range)  DULoxetine (CYMBALTA) DR capsule 90 mg (has no administration in time range)  rivaroxaban (XARELTO) tablet 20 mg (has no administration in time range)  gabapentin (NEURONTIN) capsule 1,200 mg (1,200 mg Oral Given 11/24/20 0312)  insulin aspart (novoLOG) injection 0-9  Units (has no administration in time range)  acetaminophen (TYLENOL) tablet 650 mg (has no administration in time range)    Or  acetaminophen (TYLENOL) suppository 650 mg (has no administration in time range)  ipratropium-albuterol (DUONEB) 0.5-2.5 (3) MG/3ML nebulizer solution 3 mL (has no administration in time range)  budesonide (PULMICORT) nebulizer solution 0.25 mg (has no administration in time range)  insulin glargine (LANTUS) injection 150 Units (has no administration in time range)  aspirin chewable tablet 324 mg (324 mg Oral Given 11/24/20 0258)    ED Course  I have reviewed the triage vital signs and the nursing notes.  Pertinent labs & imaging results that were available during my care of the patient were reviewed by me and considered in my medical decision making (see chart for details).    MDM Rules/Calculators/A&P                          Patient presents to the ED with complaints of worsening dyspnea, intermittent chest tightness, and cough.  She is nontoxic, her vitals are notable for borderline SPO2 on my assessment around 91 to 93% on her baseline 2 L, otherwise fairly unremarkable.  Patient has some decreased breath sounds.  Has trace 1+ symmetric pitting edema to the lower legs.  DDx: Pneumonia, CHF, atypical ACS, pulmonary embolism, critical anemia, chronic fatigue status post Covid.  Additional history obtained:  Additional history  obtained from chart review & nursing note review.   EKG: NO STEMI, LBBB- has been present previously.   Lab Tests:  I Ordered, reviewed, and interpreted labs, which included:  CBC: Mild leukocytosis which is improved from prior.  Anemic but similar to prior ranges. CMP: Mildly low bicarb & albumin, otherwise unremarkable. Troponin: Elevated at 76, this is increased from prior.  Imaging Studies ordered:  I ordered imaging studies which included CXR, I independently reviewed, formal radiology impression shows: Similar-appearing  creased interstitial markings with no overt pulmonary edema.  Chest x-ray without obvious infiltrate to suggest bacterial pneumonia.  No pneumothorax.  Consider pulmonary embolism, patient has had prior PE study within the past few weeks which was negative and she has been compliant with her Xarelto therefore feel this is somewhat less likely.  She does have a troponin value that is elevated compared to her prior at 76 today, her heart pathway score is a 6, given her risk factors with a degree of exertional symptoms feel she warrants admission at this time.  Discussed findings and plan of care with supervising physician Dr. Tyrone Nine who is in agreement. Patient agreeable as well.  Discussed with hospitliast Dr. Hal Hope who accepts admission.    Portions of this note were generated with [employer]. Dictation errors may occur despite best attempts at proofreading.  Final Clinical Impression(s) / ED Diagnoses Final diagnoses:  Elevated troponin  Exertional dyspnea  Chest tightness    Rx / DC Orders ED Discharge Orders    None       Amaryllis Dyke, PA-C 11/24/20 [place], [place], DO 11/24/20 [phone]

## 2020-11-24 ENCOUNTER — Encounter (HOSPITAL_COMMUNITY): Payer: Self-pay | Admitting: Internal Medicine

## 2020-11-24 ENCOUNTER — Inpatient Hospital Stay (HOSPITAL_COMMUNITY): Payer: PPO

## 2020-11-24 DIAGNOSIS — I110 Hypertensive heart disease with heart failure: Secondary | ICD-10-CM | POA: Diagnosis present

## 2020-11-24 DIAGNOSIS — J9601 Acute respiratory failure with hypoxia: Secondary | ICD-10-CM | POA: Diagnosis not present

## 2020-11-24 DIAGNOSIS — I10 Essential (primary) hypertension: Secondary | ICD-10-CM | POA: Diagnosis not present

## 2020-11-24 DIAGNOSIS — R0602 Shortness of breath: Secondary | ICD-10-CM | POA: Diagnosis present

## 2020-11-24 DIAGNOSIS — Z882 Allergy status to sulfonamides status: Secondary | ICD-10-CM | POA: Diagnosis not present

## 2020-11-24 DIAGNOSIS — R778 Other specified abnormalities of plasma proteins: Secondary | ICD-10-CM | POA: Diagnosis not present

## 2020-11-24 DIAGNOSIS — Z794 Long term (current) use of insulin: Secondary | ICD-10-CM | POA: Diagnosis not present

## 2020-11-24 DIAGNOSIS — Z8249 Family history of ischemic heart disease and other diseases of the circulatory system: Secondary | ICD-10-CM | POA: Diagnosis not present

## 2020-11-24 DIAGNOSIS — Z87891 Personal history of nicotine dependence: Secondary | ICD-10-CM | POA: Diagnosis not present

## 2020-11-24 DIAGNOSIS — E1140 Type 2 diabetes mellitus with diabetic neuropathy, unspecified: Secondary | ICD-10-CM | POA: Diagnosis not present

## 2020-11-24 DIAGNOSIS — I248 Other forms of acute ischemic heart disease: Secondary | ICD-10-CM | POA: Diagnosis present

## 2020-11-24 DIAGNOSIS — J129 Viral pneumonia, unspecified: Secondary | ICD-10-CM | POA: Diagnosis present

## 2020-11-24 DIAGNOSIS — Z79899 Other long term (current) drug therapy: Secondary | ICD-10-CM | POA: Diagnosis not present

## 2020-11-24 DIAGNOSIS — Z6841 Body Mass Index (BMI) 40.0 and over, adult: Secondary | ICD-10-CM | POA: Diagnosis not present

## 2020-11-24 DIAGNOSIS — Z9049 Acquired absence of other specified parts of digestive tract: Secondary | ICD-10-CM | POA: Diagnosis not present

## 2020-11-24 DIAGNOSIS — R0600 Dyspnea, unspecified: Secondary | ICD-10-CM | POA: Diagnosis not present

## 2020-11-24 DIAGNOSIS — R0609 Other forms of dyspnea: Secondary | ICD-10-CM

## 2020-11-24 DIAGNOSIS — G4733 Obstructive sleep apnea (adult) (pediatric): Secondary | ICD-10-CM | POA: Diagnosis not present

## 2020-11-24 DIAGNOSIS — Z7901 Long term (current) use of anticoagulants: Secondary | ICD-10-CM | POA: Diagnosis not present

## 2020-11-24 DIAGNOSIS — Z8615 Personal history of latent tuberculosis infection: Secondary | ICD-10-CM | POA: Diagnosis not present

## 2020-11-24 DIAGNOSIS — I447 Left bundle-branch block, unspecified: Secondary | ICD-10-CM | POA: Diagnosis present

## 2020-11-24 DIAGNOSIS — I5032 Chronic diastolic (congestive) heart failure: Secondary | ICD-10-CM | POA: Diagnosis present

## 2020-11-24 DIAGNOSIS — M4800 Spinal stenosis, site unspecified: Secondary | ICD-10-CM | POA: Diagnosis present

## 2020-11-24 DIAGNOSIS — E1165 Type 2 diabetes mellitus with hyperglycemia: Secondary | ICD-10-CM | POA: Diagnosis present

## 2020-11-24 DIAGNOSIS — R0789 Other chest pain: Secondary | ICD-10-CM | POA: Diagnosis present

## 2020-11-24 DIAGNOSIS — Z888 Allergy status to other drugs, medicaments and biological substances status: Secondary | ICD-10-CM | POA: Diagnosis not present

## 2020-11-24 DIAGNOSIS — R079 Chest pain, unspecified: Secondary | ICD-10-CM | POA: Diagnosis not present

## 2020-11-24 DIAGNOSIS — J302 Other seasonal allergic rhinitis: Secondary | ICD-10-CM | POA: Diagnosis present

## 2020-11-24 DIAGNOSIS — I480 Paroxysmal atrial fibrillation: Secondary | ICD-10-CM | POA: Diagnosis not present

## 2020-11-24 DIAGNOSIS — E785 Hyperlipidemia, unspecified: Secondary | ICD-10-CM | POA: Diagnosis present

## 2020-11-24 LAB — COMPREHENSIVE METABOLIC PANEL
ALT: 20 U/L (ref 0–44)
AST: 25 U/L (ref 15–41)
Albumin: 2.4 g/dL — ABNORMAL LOW (ref 3.5–5.0)
Alkaline Phosphatase: 41 U/L (ref 38–126)
Anion gap: 13 (ref 5–15)
BUN: 14 mg/dL (ref 8–23)
CO2: 20 mmol/L — ABNORMAL LOW (ref 22–32)
Calcium: 9 mg/dL (ref 8.9–10.3)
Chloride: 104 mmol/L (ref 98–111)
Creatinine, Ser: 0.85 mg/dL (ref 0.44–1.00)
GFR, Estimated: >60 mL/min (ref >60)
Glucose, Bld: 77 mg/dL (ref 70–99)
Potassium: 3.5 mmol/L (ref 3.5–5.1)
Sodium: 137 mmol/L (ref 135–145)
Total Bilirubin: 0.7 mg/dL (ref 0.3–1.2)
Total Protein: 6.9 g/dL (ref 6.5–8.1)

## 2020-11-24 LAB — CBC
HCT: 33.3 % — ABNORMAL LOW (ref 36.0–46.0)
Hemoglobin: 10.4 g/dL — ABNORMAL LOW (ref 12.0–15.0)
MCH: 28.3 pg (ref 26.0–34.0)
MCHC: 31.2 g/dL (ref 30.0–36.0)
MCV: 90.5 fL (ref 80.0–100.0)
Platelets: 533 10*3/uL — ABNORMAL HIGH (ref 150–400)
RBC: 3.68 MIL/uL — ABNORMAL LOW (ref 3.87–5.11)
RDW: 15.8 % — ABNORMAL HIGH (ref 11.5–15.5)
WBC: 12.7 10*3/uL — ABNORMAL HIGH (ref 4.0–10.5)
nRBC: 0 % (ref 0.0–0.2)

## 2020-11-24 LAB — MAGNESIUM: Magnesium: 1.9 mg/dL (ref 1.7–2.4)

## 2020-11-24 LAB — TSH: TSH: 1.217 u[IU]/mL (ref 0.350–4.500)

## 2020-11-24 LAB — ECHOCARDIOGRAM COMPLETE
AV Mean grad: 9 mmHg
Area-P 1/2: 2.39 cm2
S' Lateral: 3.1 cm

## 2020-11-24 LAB — CBG MONITORING, ED
Glucose-Capillary: 141 mg/dL — ABNORMAL HIGH (ref 70–99)
Glucose-Capillary: 180 mg/dL — ABNORMAL HIGH (ref 70–99)
Glucose-Capillary: 267 mg/dL — ABNORMAL HIGH (ref 70–99)

## 2020-11-24 LAB — RESPIRATORY PANEL BY PCR

## 2020-11-24 LAB — HEMOGLOBIN A1C
Hgb A1c MFr Bld: 8.2 % — ABNORMAL HIGH (ref 4.8–5.6)
Mean Plasma Glucose: 188.64 mg/dL

## 2020-11-24 LAB — TROPONIN I (HIGH SENSITIVITY): Troponin I (High Sensitivity): 66 ng/L — ABNORMAL HIGH (ref <18)

## 2020-11-24 LAB — BRAIN NATRIURETIC PEPTIDE: B Natriuretic Peptide: 79.3 pg/mL (ref 0.0–100.0)

## 2020-11-24 LAB — GLUCOSE, CAPILLARY: Glucose-Capillary: 264 mg/dL — ABNORMAL HIGH (ref 70–99)

## 2020-11-24 LAB — PROCALCITONIN: Procalcitonin: 0.18 ng/mL

## 2020-11-24 MED ORDER — METHYLPREDNISOLONE SODIUM SUCC 40 MG IJ SOLR
40.0000 mg | Freq: Every day | INTRAMUSCULAR | Status: DC
  Administered 2020-11-24: 40 mg via INTRAVENOUS
  Filled 2020-11-24 (×2): qty 1

## 2020-11-24 MED ORDER — ALBUTEROL SULFATE HFA 108 (90 BASE) MCG/ACT IN AERS
2.0000 | INHALATION_SPRAY | RESPIRATORY_TRACT | Status: DC | PRN
  Filled 2020-11-24: qty 6.7

## 2020-11-24 MED ORDER — ACETAMINOPHEN 650 MG RE SUPP: 650.0000 mg | Freq: Four times a day (QID) | RECTAL | Status: DC | PRN

## 2020-11-24 MED ORDER — INSULIN GLARGINE 100 UNIT/ML ~~LOC~~ SOLN
150.0000 [IU] | Freq: Every day | SUBCUTANEOUS | Status: DC
  Administered 2020-11-24: 150 [IU] via SUBCUTANEOUS
  Filled 2020-11-24 (×2): qty 1.5

## 2020-11-24 MED ORDER — INSULIN ASPART 100 UNIT/ML ~~LOC~~ SOLN: 0.0000 [IU] | Freq: Three times a day (TID) | SUBCUTANEOUS | Status: DC

## 2020-11-24 MED ORDER — SODIUM CHLORIDE 0.9 % IV SOLN
2.0000 g | INTRAVENOUS | Status: DC
  Administered 2020-11-24: 2 g via INTRAVENOUS
  Filled 2020-11-24 (×2): qty 20

## 2020-11-24 MED ORDER — ASPIRIN 81 MG PO CHEW
324.0000 mg | CHEWABLE_TABLET | Freq: Once | ORAL | Status: AC
  Administered 2020-11-24: 324 mg via ORAL
  Filled 2020-11-24: qty 4

## 2020-11-24 MED ORDER — IPRATROPIUM-ALBUTEROL 0.5-2.5 (3) MG/3ML IN SOLN
3.0000 mL | RESPIRATORY_TRACT | Status: DC
  Administered 2020-11-24 (×4): 3 mL via RESPIRATORY_TRACT
  Filled 2020-11-24 (×5): qty 3

## 2020-11-24 MED ORDER — INSULIN ASPART 100 UNIT/ML ~~LOC~~ SOLN
6.0000 [IU] | Freq: Three times a day (TID) | SUBCUTANEOUS | Status: DC
  Administered 2020-11-24 (×3): 6 [IU] via SUBCUTANEOUS

## 2020-11-24 MED ORDER — INSULIN ASPART 100 UNIT/ML ~~LOC~~ SOLN
0.0000 [IU] | Freq: Every day | SUBCUTANEOUS | Status: DC
  Administered 2020-11-24: 3 [IU] via SUBCUTANEOUS

## 2020-11-24 MED ORDER — DULOXETINE HCL 60 MG PO CPEP
90.0000 mg | ORAL_CAPSULE | Freq: Every day | ORAL | Status: DC
  Administered 2020-11-24 – 2020-11-25 (×2): 90 mg via ORAL
  Filled 2020-11-24 (×3): qty 1

## 2020-11-24 MED ORDER — IPRATROPIUM-ALBUTEROL 0.5-2.5 (3) MG/3ML IN SOLN
3.0000 mL | Freq: Two times a day (BID) | RESPIRATORY_TRACT | Status: DC
  Administered 2020-11-25 (×2): 3 mL via RESPIRATORY_TRACT
  Filled 2020-11-24 (×3): qty 3

## 2020-11-24 MED ORDER — RIVAROXABAN 20 MG PO TABS
20.0000 mg | ORAL_TABLET | Freq: Every day | ORAL | Status: DC
Start: 2020-11-24 — End: 2020-11-24
  Administered 2020-11-24 (×2): 20 mg via ORAL
  Filled 2020-11-24 (×2): qty 1

## 2020-11-24 MED ORDER — BUDESONIDE 0.25 MG/2ML IN SUSP
0.2500 mg | Freq: Two times a day (BID) | RESPIRATORY_TRACT | Status: DC
  Administered 2020-11-24 – 2020-11-26 (×5): 0.25 mg via RESPIRATORY_TRACT
  Filled 2020-11-24 (×8): qty 2

## 2020-11-24 MED ORDER — INSULIN GLARGINE 100 UNIT/ML ~~LOC~~ SOLN
140.0000 [IU] | Freq: Every day | SUBCUTANEOUS | Status: DC
  Filled 2020-11-24: qty 1.4

## 2020-11-24 MED ORDER — INSULIN ASPART 100 UNIT/ML ~~LOC~~ SOLN
0.0000 [IU] | Freq: Three times a day (TID) | SUBCUTANEOUS | Status: DC
  Administered 2020-11-24: 3 [IU] via SUBCUTANEOUS
  Administered 2020-11-24: 2 [IU] via SUBCUTANEOUS
  Administered 2020-11-24 – 2020-11-25 (×2): 3 [IU] via SUBCUTANEOUS
  Administered 2020-11-25: 5 [IU] via SUBCUTANEOUS
  Administered 2020-11-25: 3 [IU] via SUBCUTANEOUS

## 2020-11-24 MED ORDER — RIVAROXABAN 10 MG PO TABS
20.0000 mg | ORAL_TABLET | Freq: Every day | ORAL | Status: DC
  Administered 2020-11-25: 20 mg via ORAL
  Filled 2020-11-24: qty 2
  Filled 2020-11-24: qty 1
  Filled 2020-11-24: qty 2

## 2020-11-24 MED ORDER — FENOFIBRATE 160 MG PO TABS
160.0000 mg | ORAL_TABLET | Freq: Every day | ORAL | Status: DC
  Administered 2020-11-24 – 2020-11-26 (×3): 160 mg via ORAL
  Filled 2020-11-24 (×3): qty 1

## 2020-11-24 MED ORDER — ATENOLOL 50 MG PO TABS
25.0000 mg | ORAL_TABLET | Freq: Every day | ORAL | Status: DC
Start: 2020-11-24 — End: 2020-11-26
  Administered 2020-11-24 – 2020-11-26 (×3): 25 mg via ORAL
  Filled 2020-11-24 (×3): qty 1

## 2020-11-24 MED ORDER — SODIUM CHLORIDE 0.9 % IV SOLN
100.0000 mg | Freq: Two times a day (BID) | INTRAVENOUS | Status: DC
  Administered 2020-11-24 (×2): 100 mg via INTRAVENOUS
  Filled 2020-11-24 (×4): qty 100

## 2020-11-24 MED ORDER — PRAVASTATIN SODIUM 40 MG PO TABS
40.0000 mg | ORAL_TABLET | Freq: Every day | ORAL | Status: DC
Start: 2020-11-24 — End: 2020-11-26
  Administered 2020-11-24 – 2020-11-26 (×3): 40 mg via ORAL
  Filled 2020-11-24 (×3): qty 1

## 2020-11-24 MED ORDER — ACETAMINOPHEN 325 MG PO TABS: 650.0000 mg | ORAL_TABLET | Freq: Four times a day (QID) | ORAL | Status: DC | PRN

## 2020-11-24 MED ORDER — DULOXETINE HCL 60 MG PO CPEP: 60.0000 mg | ORAL_CAPSULE | Freq: Every day | ORAL | Status: DC

## 2020-11-24 MED ORDER — GABAPENTIN 400 MG PO CAPS
1200.0000 mg | ORAL_CAPSULE | Freq: Two times a day (BID) | ORAL | Status: DC
  Administered 2020-11-24 – 2020-11-26 (×6): 1200 mg via ORAL
  Filled 2020-11-24: qty 12
  Filled 2020-11-24 (×5): qty 3

## 2020-11-24 NOTE — ED Notes (Signed)
Transferred to 5-5 via transport service, a/o x4, no complaints

## 2020-11-24 NOTE — Consult Note (Addendum)
Cardiology Consultation:   Patient ID: Melanie Reeves MRN: [id number]; DOB: [birth year]  Admit date: 11/23/2020 Date of Consult: 11/24/2020  PCP:  Vernie Shanks, [place]  Cardiologist:  Dorris Carnes, MD  Advanced Practice Provider:  None Electrophysiologist:  None [phone]    Patient Profile:   Melanie Reeves is a 70 y.o. female with a hx of PAF, hypertension, hyperlipidemia, obstructive sleep apnea, DM 2, chronic dyspnea, morbid obesity and left bundle branch block who is being seen today for the evaluation of chest pain and dyspnea at the request of Dr. Hal Hope.  History of Present Illness:   Melanie Reeves is a 70 year old female with past medical history of PAF, hypertension, hyperlipidemia, obstructive sleep apnea, DM 2, chronic dyspnea, morbid obesity and left bundle branch block.  Myoview in 2018 was nonischemic.  Echocardiogram obtained on 09/30/2017 showed EF 60 to 65%, grade 1 DD, trivial MR, PA peak pressure 27 mmHg.  Previous heart monitor shows the presence of atrial fibrillation and she was placed on Xarelto.  Patient was last seen by Dr. Harrington Challenger on 06/15/2020 at which time she was doing well.  Unfortunately, she was admitted with Covid pneumonia on 10/23/2020.  Prior to that she was not vaccinated for COVID-19.  During the hospitalization, she was treated with remdesivir, IV steroid and discharged on home O2.  CTA obtained on 11/05/2020 showed interstitial and alveolar changes likely represent multifocal pneumonia, otherwise no evidence of significant PE, there was cardiac enlargement and aortic atherosclerosis.  Since discharge, her symptom initially improved, however in the past few days, she has been having worsening dyspnea on exertion and intermittent chest tightness.  She says she always had some transient and rare chest discomfort prior to the COVID-19 infection, however since the COVID-19 infection, she has been noticing more chest discomfort  especially with physical exertion and in the setting of dyspnea.  She also mentions a episode of recurrent A. fib with heart rate up to 150s two nights ago, she called her PCPs on-call service and was told to go to the ED, however A. fib stopped after 1 hour, therefore she did not go to the emergency room as instructed.  Due to increasing dyspnea, she was sent to the ED by her PCP for evaluation possible CHF.  Chest x-ray does not show any obvious pneumonia.  However on physical exam, patient had significant bilateral expiratory wheezing likely related to post viral bronchitis.  She was admitted by hospitalist service to start on nebulizer treatment and steroids.   Past Medical History:  Diagnosis Date  . Abnormal electrocardiogram 08/14/2014   LBBB  . Atrial fibrillation ([place])   . Diabetes mellitus ([place])   . [place] DEGENERATION 12/20/2009   Qualifier: Diagnosis of  By: Aline Brochure MD, Dorothyann Peng    . Dyspnea 08/14/2014  . Hyperlipidemia   . Hypertension   . LOW BACK PAIN 12/20/2009   Qualifier: Diagnosis of  By: Aline Brochure MD, Dorothyann Peng    . Morbid obesity ([place])   . OSA (obstructive sleep apnea) 08/16/2014  . Seasonal allergies   . SPINAL STENOSIS 12/20/2009   Qualifier: Diagnosis of  By: Aline Brochure MD, Dorothyann Peng      Past Surgical History:  Procedure Laterality Date  . CHOLECYSTECTOMY    . COLONOSCOPY WITH PROPOFOL N/A 11/29/2014   Procedure: COLONOSCOPY WITH PROPOFOL;  Surgeon: Arta Silence, MD;  Location: [facility] END[facility]COPY;  Service: Endoscopy;  Laterality: N/A;  . TONSILLECTOMY       Home  Medications:  Prior to Admission medications   Medication Sig Start Date End Date Taking? Authorizing Provider  Accu-Chek FastClix Lancets MISC Use as instructed to test three times daily. DX: E11.65 01/04/19   Elayne Snare, MD  atenolol (TENORMIN) 25 MG tablet Take 1 tablet (25 mg total) by mouth daily. 10/26/20   Orson Eva, MD  benzonatate (TESSALON) 100 MG capsule Take 1 capsule (100 mg total) by mouth every 8  (eight) hours. 12/07/47   Delora Fuel, MD  Blood Glucose Monitoring Suppl [facility] VERIO) w/Device KIT UAD to monitor glucose tid; Dx: E11.65 07/09/18   Elayne Snare, MD  Continuous Blood Gluc Receiver (FREESTYLE LIBRE 2 READER) DEVI USE AS DIRECTED 09/13/20   Elayne Snare, MD  Continuous Blood Gluc Sensor (FREESTYLE LIBRE 2 SENSOR) MISC APPLY TO SKIN EVERY 14 (FOURTEEN) DAYS AS DIRECTED 09/16/20   Elayne Snare, MD  doxycycline (VIBRAMYCIN) 100 MG capsule Take 1 capsule (100 mg total) by mouth 2 (two) times daily. One po bid x 7 days 7/53/00   Delora Fuel, MD  DULoxetine (CYMBALTA) 30 MG capsule TAKE 1 CAPSULE (30 MG TOTAL) BY MOUTH DAILY. TAKE IN ADDITION TO THE 60 MG 11/16/20   Elayne Snare, MD  DULoxetine (CYMBALTA) 60 MG capsule Take 60 mg by mouth daily. TAKE 1 TABLET BY MOUTH ONCE DAILY.    [provider]  fenofibrate (TRICOR) 145 MG tablet TAKE 1 TABLET BY MOUTH EVERY DAY 11/16/20   Elayne Snare, MD  gabapentin (NEURONTIN) 600 MG tablet TAKE 1 TABLET BY MOUTH 3 TIMES A DAY AND 1 TABLET AT BEDTIME Patient taking differently: Take 1,200 mg by mouth 2 (two) times daily. 10/09/20   Elayne Snare, MD  HYDROcodone-acetaminophen (NORCO/VICODIN) 5-325 MG tablet Take 1 tablet by mouth every 6 (six) hours as needed for moderate pain.    [provider]  Insulin Pen Needle (B-D ULTRAFINE III SHORT PEN) 31G X 8 MM MISC Use to inject medication 5 times daily. 09/02/19   Elayne Snare, MD  INVOKANA 300 MG TABS tablet TAKE 1 TABLET (300 MG TOTAL) BY MOUTH DAILY BEFORE BREAKFAST. Patient taking differently: Take 300 mg by mouth daily before breakfast. 09/17/20   Elayne Snare, MD  loratadine (CLARITIN) 10 MG tablet Take 10 mg by mouth daily.    [provider]  metFORMIN (GLUCOPHAGE-XR) 500 MG 24 hr tablet TAKE 4 TABLETS (2,000 MG TOTAL) BY MOUTH DAILY WITH SUPPER. 08/27/20   Elayne Snare, MD  NOVOLOG FLEXPEN 100 UNIT/ML FlexPen INJECT 20-35 UNITS UNDER THE SKIN THREE TIMES DAILY BEFORE  MEALS. Patient taking differently: Inject 20-35 Units into the skin 3 (three) times daily with meals. Inject 20-35 units under the skin three times daily before meals. 10/09/20   Elayne Snare, MD  Omega-3 Fatty Acids (FISH OIL) 1000 MG CAPS Take 2 capsules by mouth daily.    [provider]  ONETOUCH ULTRA test strip USE AS INSTRUCTED TO CHECK BLOOD SUGAR THREE TIMES DAILY. 10/09/19   Elayne Snare, MD  pantoprazole (PROTONIX) 40 MG tablet Take 40 mg by mouth daily. Take 1 tablet by mouth once daily. Patient not taking: No sig reported 06/09/19   [provider]  pravastatin (PRAVACHOL) 40 MG tablet TAKE 1 TABLET BY MOUTH EVERY DAY 11/17/20   Elayne Snare, MD  predniSONE (DELTASONE) 20 MG tablet Take 3 tablets (60 mg total) by mouth 2 (two) times daily with a meal. Patient not taking: No sig reported 10/25/20   Orson Eva, MD  tiZANidine (ZANAFLEX) 2 MG  tablet Take 2 mg by mouth every 6 (six) hours as needed for muscle spasms.    [provider]  TOUJEO MAX SOLOSTAR 300 UNIT/ML Solostar Pen INJECT 140 UNITS INTO THE SKIN DAILY. . 06/05/20   Elayne Snare, MD  VICTOZA 18 MG/3ML SOPN INJECT 0.3 MLS (1.8 MG TOTAL) INTO THE SKIN DAILY. INJECT ONCE DAILY AT THE SAME TIME 11/22/20   Elayne Snare, MD  XARELTO 20 MG TABS tablet TAKE 1 TABLET (20 MG TOTAL) BY MOUTH DAILY WITH SUPPER. Patient taking differently: Take 20 mg by mouth daily with supper. 06/27/20   Fay Records, MD    Inpatient Medications: Scheduled Meds: . atenolol  25 mg Oral Daily  . budesonide (PULMICORT) nebulizer solution  0.25 mg Nebulization BID  . DULoxetine  90 mg Oral QHS  . fenofibrate  160 mg Oral Daily  . gabapentin  1,200 mg Oral BID  . insulin aspart  0-15 Units Subcutaneous TID WC  . insulin aspart  0-5 Units Subcutaneous QHS  . insulin aspart  6 Units Subcutaneous TID WC  . insulin glargine  150 Units Subcutaneous Daily  . ipratropium-albuterol  3 mL Nebulization Q4H  . methylPREDNISolone (SOLU-MEDROL)  injection  40 mg Intravenous Daily  . pravastatin  40 mg Oral Daily  . rivaroxaban  20 mg Oral Q supper   Continuous Infusions: . cefTRIAXone (ROCEPHIN)  IV Stopped (11/24/20 0920)  . doxycycline (VIBRAMYCIN) IV Stopped (11/24/20 0902)   PRN Meds: acetaminophen **OR** acetaminophen, albuterol  Allergies:    Allergies  Allergen Reactions  . Simvastatin     Other reaction(s): leg weakness  . Sulfa Antibiotics Other (See Comments)    Cant remember    Social History:   Social History   Socioeconomic History  . Marital status: Married    Spouse name: Not on file  . Number of children: 1  . Years of education: Not on file  . Highest education level: Not on file  Occupational History    Comment: Works at [employer]  . Smoking status: Former Smoker    Packs/day: 2.00    Years: 20.00    Pack years: 40.00    Types: Cigarettes    Quit date: 11/22/1993    Years since quitting: 27.0  . Smokeless tobacco: Former Network engineer  . Vaping Use: Never used  Substance and Sexual Activity  . Alcohol use: Yes    Alcohol/week: 0.0 standard drinks  . Drug use: No  . Sexual activity: Not on file  Other Topics Concern  . Not on file  Social History Narrative  . Not on file   Social Determinants of Health   Financial Resource Strain: Not on file  Food Insecurity: Not on file  Transportation Needs: Not on file  Physical Activity: Not on file  Stress: Not on file  Social Connections: Not on file  Intimate Partner Violence: Not on file    Family History:    Family History  Problem Relation Age of Onset  . Heart disease Father        MI  . Cancer - Lung Father        Small cell Lung CA     ROS:  Please see the history of present illness.   All other ROS reviewed and negative.     Physical Exam/Data:   Vitals:   11/24/20 0930 11/24/20 0945 11/24/20 1000 11/24/20 1015  BP: (!) 149/65 (!) 145/80 (!) 146/57 (!) 158/81  Pulse:  98 95 100 98  Resp: 18 20 (!)  21 (!) 21  Temp:      TempSrc:      SpO2: 94% 91% 94% 92%    Intake/Output Summary (Last 24 hours) at 11/24/2020 1029 Last data filed at 11/24/2020 0920 Gross per 24 hour  Intake 349.54 ml  Output -  Net 349.54 ml   Last 3 Weights 11/04/2020 10/22/2020 09/20/2020  Weight (lbs) 300 lb 306 lb 306 lb 2 oz  Weight (kg) 136.079 kg 138.801 kg 138.857 kg     There is no height or weight on file to calculate BMI.  General:  Well nourished, well developed, in no acute distress.  Morbid obesity HEENT: normal Lymph: no adenopathy Neck: no JVD Endocrine:  No thryomegaly Vascular: No carotid bruits; FA pulses 2+ bilaterally without bruits  Cardiac:  normal S1, S2; RRR; no murmur  Lungs:  clear to auscultation bilaterally, no rhonchi or rales.  Mild expiratory wheezing Abd: soft, nontender, no hepatomegaly  Ext: no edema Musculoskeletal:  No deformities, BUE and BLE strength normal and equal Skin: warm and dry  Neuro:  CNs 2-12 intact, no focal abnormalities noted Psych:  Normal affect   EKG:  The EKG was personally reviewed and demonstrates: Sinus rhythm with left bundle branch block Telemetry:  Telemetry was personally reviewed and demonstrates: Sinus rhythm, left bundle branch block, borderline elevated heart rate  Relevant CV Studies:  Echo 09/30/2017 LV EF: 60% -   65%  Study Conclusions   - Left ventricle: The cavity size was normal. Wall thickness was    increased in a pattern of moderate LVH. Systolic function was    normal. The estimated ejection fraction was in the range of 60%    to 65%. Wall motion was normal; there were no regional wall    motion abnormalities. Doppler parameters are consistent with    abnormal left ventricular relaxation (grade 1 diastolic    dysfunction). The E/e&' ratio is between 8-15, suggesting    indeterminate LV fililng pressure.  - Mitral valve: Mildly thickened leaflets . There was trivial    regurgitation.  - Left atrium: The atrium was normal  in size.  - Right atrium: The atrium was normal in size.  - Tricuspid valve: There was trivial regurgitation.  - Pulmonary arteries: PA peak pressure: 27 mm Hg (S).  - Inferior vena cava: The vessel was normal in size. The    respirophasic diameter changes were in the normal range (= 50%),    consistent with normal central venous pressure.   Impressions:   - Compared to a prior study in 2015, the LVEF is slightly lower but    normal at 60-65% with moderate LVH, grade 1 DD and indetermiante    LV fililng pressure.    Myoview 10/01/2017  Nuclear stress EF: 45%.  There was no ST segment deviation noted during stress.  The left ventricular ejection fraction is mildly decreased (45-54%).  This is an intermediate risk study due to reduced systolic function. No ischemia noted.  Abnormal resting perfusion images noted in the septum, consistent with LBBB.    Laboratory Data:  High Sensitivity Troponin:   Recent Labs  Lab 11/04/20 1715 11/05/20 0310 11/23/20 2312 11/24/20 0110  TROPONINIHS 36* 26* 76* 66*     Chemistry Recent Labs  Lab 11/23/20 2312 11/24/20 0352  NA 139 137  K 3.8 3.5  CL 105 104  CO2 21* 20*  GLUCOSE 93 77  BUN 17 14  CREATININE 0.88 0.85  CALCIUM 9.5 9.0  GFRNONAA >60 >60  ANIONGAP 13 13    Recent Labs  Lab 11/23/20 2312 11/24/20 0352  PROT 7.8 6.9  ALBUMIN 2.7* 2.4*  AST 32 25  ALT 22 20  ALKPHOS 45 41  BILITOT 0.8 0.7   Hematology Recent Labs  Lab 11/23/20 2312 11/24/20 0352  WBC 13.3* 12.7*  RBC 4.22 3.68*  HGB 11.9* 10.4*  HCT 37.9 33.3*  MCV 89.8 90.5  MCH 28.2 28.3  MCHC 31.4 31.2  RDW 16.0* 15.8*  PLT 612* 533*   BNP Recent Labs  Lab 11/24/20 0110  BNP 79.3    DDimer No results for input(s): DDIMER in the last 168 hours.   Radiology/Studies:  DG Chest 2 View  Result Date: 11/23/2020 CLINICAL DATA:  Dyspnea. Tachycardic. Leg swelling, rule out congestive heart failure. EXAM: CHEST - 2 VIEW COMPARISON:  CT  chest 11/04/2020, chest x-ray 11/04/2020, chest x-ray 10/23/2020 FINDINGS: The heart size and mediastinal contours are unchanged. Improved bibasilar patchy airspace opacities with no focal consolidation. Similar-appearing increased interstitial markings with no overt pulmonary edema. No pleural effusion. No pneumothorax. No acute osseous abnormality. Multilevel degenerative changes of the spine. IMPRESSION: Similar-appearing creased interstitial markings with no overt pulmonary edema. Electronically Signed   By: Iven Finn M.D.   On: 11/23/2020 23:21     Assessment and Plan:   1. Chest pain    - borderline elevated troponin 76 --> 66  -Although chest tightness occurs with physical activity, however onset of the chest discomfort was in the setting of COVID pneumonia and worsening shortness of breath.  -EKG nondiagnostic due to left bundle branch block.  Serial troponin flat, likely related to demand ischemia.  Recent CTA was negative for PE but shows cardiac enlargement.  Pending echocardiogram, if echocardiogram shows normal EF without wall motion abnormality, would not recommend any further work-up during this admission and we will reassess as outpatient.  -Patient had a negative Myoview in 2018, no prior history of CAD.  Not a great candidate for Myoview given morbid obesity with weight of 300 pounds.  2. Worsening exertional dyspnea: Recently treated Covid pneumonia in January, initially improved, however had worsening symptoms in the past few days.  She has been treated for post viral bronchitis with nebulizer and steroids.  Low suspicion for acute heart failure symptom.  Expiratory wheezing is improving after initial nebulizer treatment.  3. PAF: Continue on Xarelto and atenolol.  Per patient, she had recurrent A. fib 2 days ago, however quickly converted back to sinus rhythm after hour.  I would not be too surprised that with recent COVID-19 infection and pneumonitis, she has more episodes  of A. Fib in the near future  4. Hypertension  5. Hyperlipidemia  6. Obstructive sleep apnea: Surprisingly, she she does not have a CPAP machine at home, she says she is pending sleep study next week.  7. DM2  8. Chronic left bundle branch block   Risk Assessment/Risk Scores:     HEAR Score (for undifferentiated chest pain):  HEAR Score: 5    CHA2DS2-VASc Score = 4  This indicates a 4.8% annual risk of stroke. The patient's score is based upon: CHF History: No HTN History: Yes Diabetes History: Yes Stroke History: No Vascular Disease History: No Age Score: 1 Gender Score: 1          For questions or updates, please contact [place] Please consult [email or web address] for contact info under    Signed,  Almyra Deforest, [place]  11/24/2020 10:29 AM  Personally seen and examined. Agree with APP above with the following comments: Briefly 70 yo M  Recovering from COVID PNA, who presented with CP  Patient notes that since she had COVID her AF burden has increase dramatically, shows an episodes at midnight a few days ago with heart rate 155 and sudden on sent on her phone.  She and her sister tell me there is a correlation between her CP and her palpitations. Exam notable for Obese Female with coarse breath sounds worse in the bases Labs notable for novel troponin 76-66. Personally reviewed relevant tests; Echocardiogram shows no significant change from prior. No CAC on CT; Aortic atherosclerosis noted Would recommend telemetry to assess if AF burden is elevated or if her Apple Watch is just showing sinus tachycardia with recovery from Covid-19.  Could be reasonable outpatient stress test if CP occurs outside of AF.  If significant AF burden, would either increase her atenolol or change AV nodal agent.  Has BP room and no AKI.  Discussed with patient and daughter.  Werner Lean, MD

## 2020-11-24 NOTE — Progress Notes (Signed)
New Admission Note:  Arrival Method: Pt came by bed from ED around 2010 Mental Orientation: Alert and oriented  Telemetry: 6, CCMD notified Assessment: Completed Skin: Completed, refer to flowsheets IV: Left AC S.L.  Pain: Denies Tubes: None Safety Measures: Safety Fall Prevention Plan was given, discussed and signed. Admission: Completed [facility] Orientation: Patient has been orientated to the room, unit and the staff. Family: None  Orders have been reviewed and implemented. Will continue to monitor the patient. Call light has been placed within reach and bed alarm has been activated.   Alfonse Ras, RN  Phone Number: [phone]

## 2020-11-24 NOTE — H&P (Signed)
History and Physical    Melanie Reeves ION:[id number] DOB: 07-18-1951 DOA: 11/23/2020  PCP: Vernie Shanks, MD  Patient coming from: Home.  Chief Complaint: Shortness of breath.  HPI: Melanie Reeves is a 71 y.o. female with history of paroxysmal atrial fibrillation, LBBB with 2D echo showing grade 1 diastolic dysfunction in December 2018, diabetes mellitus type 2, hypertension admitted last month for Covid infection and discharged on January 13 had been increasingly short of breath over the last few weeks since last admission for Covid.  Patient had come to the ER the following week had CT angiogram of the chest which showed features concerning for pneumonia.  There was no pulmonary embolism.  Over the last 1 week patient has become more short of breath with wheezing and cough and patient's primary care advised patient to come to the ER.  Denies any chest pain fever chills.  Cough is mostly nonproductive.  ED Course: In the ER chest x-ray shows increased interstitial markings.  BNP was 79-3 troponin was 76 and 66.  WBC count of 13.3.  EKG shows sinus rhythm with LBBB.  Patient has known history of LBBB.  On exam patient is diffusely wheezing bilaterally.  Patient admitted for shortness of breath with exertion likely from post viral bronchitis.  Review of Systems: As per HPI, rest all negative.   Past Medical History:  Diagnosis Date  . Abnormal electrocardiogram 08/14/2014   LBBB  . Atrial fibrillation ([place])   . Diabetes mellitus ([place])   . [place] DEGENERATION 12/20/2009   Qualifier: Diagnosis of  By: Aline Brochure MD, Dorothyann Peng    . Dyspnea 08/14/2014  . Hyperlipidemia   . Hypertension   . LOW BACK PAIN 12/20/2009   Qualifier: Diagnosis of  By: Aline Brochure MD, Dorothyann Peng    . Morbid obesity ([place])   . OSA (obstructive sleep apnea) 08/16/2014  . Seasonal allergies   . SPINAL STENOSIS 12/20/2009   Qualifier: Diagnosis of  By: Aline Brochure MD, Dorothyann Peng      Past Surgical History:  Procedure Laterality Date   . CHOLECYSTECTOMY    . COLONOSCOPY WITH PROPOFOL N/A 11/29/2014   Procedure: COLONOSCOPY WITH PROPOFOL;  Surgeon: Arta Silence, MD;  Location: [facility];  Service: Endoscopy;  Laterality: N/A;  . TONSILLECTOMY       reports that she quit smoking about 27 years ago. Her smoking use included cigarettes. She has a 40.00 pack-year smoking history. She has quit using smokeless tobacco. She reports current alcohol use. She reports that she does not use drugs.  Allergies  Allergen Reactions  . Simvastatin     Other reaction(s): leg weakness  . Sulfa Antibiotics Other (See Comments)    Cant remember    Family History  Problem Relation Age of Onset  . Heart disease Father        MI  . Cancer - Lung Father        Small cell Lung CA    Prior to Admission medications   Medication Sig Start Date End Date Taking? Authorizing Provider  Accu-Chek FastClix Lancets MISC Use as instructed to test three times daily. DX: E11.65 01/04/19   Elayne Snare, MD  atenolol (TENORMIN) 25 MG tablet Take 1 tablet (25 mg total) by mouth daily. 10/26/20   Orson Eva, MD  benzonatate (TESSALON) 100 MG capsule Take 1 capsule (100 mg total) by mouth every 8 (eight) hours. 2/44/01   Delora Fuel, MD  Blood Glucose Monitoring Suppl [facility] VERIO) w/Device KIT UAD to monitor  glucose tid; Dx: E11.65 07/09/18   Reather Littler, MD  Continuous Blood Gluc Receiver (FREESTYLE LIBRE 2 READER) DEVI USE AS DIRECTED 09/13/20   Reather Littler, MD  Continuous Blood Gluc Sensor (FREESTYLE LIBRE 2 SENSOR) MISC APPLY TO SKIN EVERY 14 (FOURTEEN) DAYS AS DIRECTED 09/16/20   Reather Littler, MD  doxycycline (VIBRAMYCIN) 100 MG capsule Take 1 capsule (100 mg total) by mouth 2 (two) times daily. One po bid x 7 days 11/05/20   Dione Booze, MD  DULoxetine (CYMBALTA) 30 MG capsule TAKE 1 CAPSULE (30 MG TOTAL) BY MOUTH DAILY. TAKE IN ADDITION TO THE 60 MG 11/16/20   Reather Littler, MD  DULoxetine (CYMBALTA) 60 MG capsule Take 60 mg by mouth daily. TAKE 1  TABLET BY MOUTH ONCE DAILY.    [provider]  fenofibrate (TRICOR) 145 MG tablet TAKE 1 TABLET BY MOUTH EVERY DAY 11/16/20   Reather Littler, MD  gabapentin (NEURONTIN) 600 MG tablet TAKE 1 TABLET BY MOUTH 3 TIMES A DAY AND 1 TABLET AT BEDTIME Patient taking differently: Take 1,200 mg by mouth 2 (two) times daily. 10/09/20   Reather Littler, MD  HYDROcodone-acetaminophen (NORCO/VICODIN) 5-325 MG tablet Take 1 tablet by mouth every 6 (six) hours as needed for moderate pain.    [provider]  Insulin Pen Needle (B-D ULTRAFINE III SHORT PEN) 31G X 8 MM MISC Use to inject medication 5 times daily. 09/02/19   Reather Littler, MD  INVOKANA 300 MG TABS tablet TAKE 1 TABLET (300 MG TOTAL) BY MOUTH DAILY BEFORE BREAKFAST. Patient taking differently: Take 300 mg by mouth daily before breakfast. 09/17/20   Reather Littler, MD  loratadine (CLARITIN) 10 MG tablet Take 10 mg by mouth daily.    [provider]  metFORMIN (GLUCOPHAGE-XR) 500 MG 24 hr tablet TAKE 4 TABLETS (2,000 MG TOTAL) BY MOUTH DAILY WITH SUPPER. 08/27/20   Reather Littler, MD  NOVOLOG FLEXPEN 100 UNIT/ML FlexPen INJECT 20-35 UNITS UNDER THE SKIN THREE TIMES DAILY BEFORE MEALS. Patient taking differently: Inject 20-35 Units into the skin 3 (three) times daily with meals. Inject 20-35 units under the skin three times daily before meals. 10/09/20   Reather Littler, MD  Omega-3 Fatty Acids (FISH OIL) 1000 MG CAPS Take 2 capsules by mouth daily.    [provider]  ONETOUCH ULTRA test strip USE AS INSTRUCTED TO CHECK BLOOD SUGAR THREE TIMES DAILY. 10/09/19   Reather Littler, MD  pantoprazole (PROTONIX) 40 MG tablet Take 40 mg by mouth daily. Take 1 tablet by mouth once daily. Patient not taking: No sig reported 06/09/19   [provider]  pravastatin (PRAVACHOL) 40 MG tablet TAKE 1 TABLET BY MOUTH EVERY DAY 11/17/20   Reather Littler, MD  predniSONE (DELTASONE) 20 MG tablet Take 3 tablets (60 mg total) by mouth 2 (two) times daily with a  meal. Patient not taking: No sig reported 10/25/20   Catarina Hartshorn, MD  tiZANidine (ZANAFLEX) 2 MG tablet Take 2 mg by mouth every 6 (six) hours as needed for muscle spasms.    [provider]  TOUJEO MAX SOLOSTAR 300 UNIT/ML Solostar Pen INJECT 140 UNITS INTO THE SKIN DAILY. . 06/05/20   Reather Littler, MD  VICTOZA 18 MG/3ML SOPN INJECT 0.3 MLS (1.8 MG TOTAL) INTO THE SKIN DAILY. INJECT ONCE DAILY AT THE SAME TIME 11/22/20   Reather Littler, MD  XARELTO 20 MG TABS tablet TAKE 1 TABLET (20 MG TOTAL) BY MOUTH DAILY WITH SUPPER. Patient taking differently: Take 20 mg by  mouth daily with supper. 06/27/20   Fay Records, MD    Physical Exam: Constitutional: Moderately built and nourished. Vitals:   11/23/20 2345 11/24/20 0000 11/24/20 0015 11/24/20 0030  BP: 134/72 136/75 109/90 111/62  Pulse: 90 89 95 91  Resp: 17 16 (!) 23 (!) 25  Temp:      TempSrc:      SpO2: 97% 95% 92% 98%   Eyes: Anicteric no pallor. ENMT: No discharge from the ears eyes nose and mouth: Neck: No mass felt.  No neck rigidity. Respiratory: Bilateral expiratory wheeze heard. Cardiovascular: S1-S2 heard. Abdomen: Soft nontender bowel sound present. Musculoskeletal: No edema. Skin: No rash. Neurologic: Alert awake oriented to time place and person.  Moves all extremities. Psychiatric: Appears normal.  Normal affect.   Labs on Admission: I have personally reviewed following labs and imaging studies  CBC: Recent Labs  Lab 11/23/20 2312  WBC 13.3*  NEUTROABS 8.9*  HGB 11.9*  HCT 37.9  MCV 89.8  PLT 300*   Basic Metabolic Panel: Recent Labs  Lab 11/23/20 2312  NA 139  K 3.8  CL 105  CO2 21*  GLUCOSE 93  BUN 17  CREATININE 0.88  CALCIUM 9.5   GFR: CrCl cannot be calculated (Unknown ideal weight.). Liver Function Tests: Recent Labs  Lab 11/23/20 2312  AST 32  ALT 22  ALKPHOS 45  BILITOT 0.8  PROT 7.8  ALBUMIN 2.7*   No results for input(s): LIPASE, AMYLASE in the last 168 hours. No results  for input(s): AMMONIA in the last 168 hours. Coagulation Profile: No results for input(s): INR, PROTIME in the last 168 hours. Cardiac Enzymes: No results for input(s): CKTOTAL, CKMB, CKMBINDEX, TROPONINI in the last 168 hours. BNP (last 3 results) No results for input(s): PROBNP in the last 8760 hours. HbA1C: No results for input(s): HGBA1C in the last 72 hours. CBG: No results for input(s): GLUCAP in the last 168 hours. Lipid Profile: No results for input(s): CHOL, HDL, LDLCALC, TRIG, CHOLHDL, LDLDIRECT in the last 72 hours. Thyroid Function Tests: No results for input(s): TSH, T4TOTAL, FREET4, T3FREE, THYROIDAB in the last 72 hours. Anemia Panel: No results for input(s): VITAMINB12, FOLATE, FERRITIN, TIBC, IRON, RETICCTPCT in the last 72 hours. Urine analysis:    Component Value Date/Time   COLORURINE YELLOW 11/16/2015 2305   APPEARANCEUR CLOUDY (A) 11/16/2015 2305   LABSPEC 1.014 11/16/2015 2305   PHURINE 5.5 11/16/2015 2305   GLUCOSEU 100 (A) 11/16/2015 2305   HGBUR NEGATIVE 11/16/2015 2305   BILIRUBINUR NEGATIVE 11/16/2015 2305   KETONESUR NEGATIVE 11/16/2015 2305   PROTEINUR NEGATIVE 11/16/2015 2305   NITRITE NEGATIVE 11/16/2015 2305   LEUKOCYTESUR MODERATE (A) 11/16/2015 2305   Sepsis Labs: $[email or web address]$ (procalcitonin:4,lacticidven:4) )No results found for this or any previous visit (from the past 240 hour(s)).   Radiological Exams on Admission: DG Chest 2 View  Result Date: 11/23/2020 CLINICAL DATA:  Dyspnea. Tachycardic. Leg swelling, rule out congestive heart failure. EXAM: CHEST - 2 VIEW COMPARISON:  CT chest 11/04/2020, chest x-ray 11/04/2020, chest x-ray 10/23/2020 FINDINGS: The heart size and mediastinal contours are unchanged. Improved bibasilar patchy airspace opacities with no focal consolidation. Similar-appearing increased interstitial markings with no overt pulmonary edema. No pleural effusion. No pneumothorax. No acute osseous abnormality. Multilevel  degenerative changes of the spine. IMPRESSION: Similar-appearing creased interstitial markings with no overt pulmonary edema. Electronically Signed   By: Iven Finn M.D.   On: 11/23/2020 23:21    EKG: Independently reviewed.  Normal sinus rhythm with  RBBB.  Assessment/Plan Principal Problem:   Exertional dyspnea Active Problems:   Essential hypertension   OSA (obstructive sleep apnea)   Paroxysmal atrial fibrillation (HCC)   Diabetes mellitus ([place])    1. Exertional dyspnea with bilateral expiratory wheeze likely post viral bronchitis causing symptoms.  On exam patient has bilateral diffuse wheezing.  We will keep patient on nebulizer Pulmicort and steroids.  We will also check a 2D echo.  Trend cardiac markers.  On doxycycline also.  May discontinue doxycycline if procalcitonin is negative. 2. Elevated high sensitive troponin -we will trend cardiac markers and check 2D echo.  Denies any chest pain.  Recent CT angiogram of the chest was negative for PE. 3. Paroxysmal atrial fibrillation presently in sinus rhythm on Xarelto and metoprolol. 4. Diabetes mellitus type 2 on Toujeo 5150 units.  Since patient is on steroids we will keep patient on moderate dose sliding scale coverage may need to increase the dose based on CBGs since patient on steroids. 5. Recent admission for Covid infection.   DVT prophylaxis: Xarelto. Code Status: Full code. Family Communication: Discussed with patient. Disposition Plan: Home. Consults called: Cardiology for a troponin. Admission status: Observation.   Rise Patience MD [facility] Pager [phone].  If 7PM-7AM, please contact night-coverage [email or web address] Password [facility]  11/24/2020, 2:36 AM

## 2020-11-24 NOTE — ED Notes (Signed)
Pt assisted to restroom.  

## 2020-11-24 NOTE — ED Notes (Signed)
Patient transported to echo ?

## 2020-11-24 NOTE — Progress Notes (Signed)
Echocardiogram 2D Echocardiogram has been performed.  Melanie Reeves 11/24/2020, 11:47 AM

## 2020-11-24 NOTE — ED Notes (Signed)
Dinner tray delivered.

## 2020-11-24 NOTE — Progress Notes (Signed)
PT Cancellation Note  Patient Details Name: Melanie Reeves MRN: [id number] DOB: 1951-06-22   Cancelled Treatment:    Reason Eval/Treat Not Completed: Other (comment). Pt was seen to attempt therapy and was too sleepy to try to move.  Her family member came in and also attempted to awaken her unsuccessfully. Follow up as time and pt allow.   Ivar Drape 11/24/2020, 3:59 PM   Samul Dada, PT MS Acute Rehab Dept. Number: [facility] [id number] and [facility] [phone]

## 2020-11-24 NOTE — Progress Notes (Signed)
[facility] PROGRESS NOTE    Progress Note  Melanie Reeves  YYT:[id number] DOB: Jun 15, 1951 DOA: 11/23/2020 PCP: Ileana Ladd, MD     Brief Narrative:   Melanie Reeves is an 70 y.o. female past medical history significant for paroxysmal atrial fibrillation, left bundle branch block with a 2D echo with grade 1 diastolic heart failure in December 2018, diabetes mellitus type 2 admitted for Covid last month discharged on 10/25/2020 who progressively got more more short of breath started about 1 week prior to admission.    Significant Events: 10/25/2020  Significant studies: 11/04/2020 CT angio of the chest showed no PE but residual multifocal pneumonia. 11/24/2020 chest x-ray that showed that showed increased interstitial marking bilaterally  Antibiotics: 10/24/2020 Rocephin and doxycycline  Microbiology data: Blood culture:  Procedures: None  Assessment/Plan:   Acute respiratory failure with hypoxia: Really requiring 2 L of oxygen to keep saturations greater than 92%. Poor air movement and bilateral wheezing on physical exam with chest x-ray showing progressive bilateral markings. I agree with inhalers and steroids. Cardiac biomarkers have remained basically flat, will continue her oral doxycycline for 3 additional days. Her procalcitonin is low yield but mildly elevated. She has an elevated white blood cell count which is mild, but has remained afebrile. Check a respiratory viral panel to her regimen.  Elevated high sensitive troponins: 2D echo is pending she denies any chest pain. Likely demand ischemia in the setting of exertional dyspnea.  Paroxysmal atrial fibrillation: Rate controlled on metoprolol continue Xarelto.  Diabetes mellitus type 2: With a last A1c of 8.2, blood glucose elevated we have placed on steroids which will make her insulin requirements higher.  Essential hypertension: Continue atenolol pressure is relatively well controlled.  OSA  (obstructive sleep apnea) C-pap at -Night.   DVT prophylaxis: lovenox Family Communication:none Status is: Observation  The patient will require care spanning > 2 midnights and should be moved to inpatient because: Hemodynamically unstable  Dispo: The patient is from: Home              Anticipated d/c is to: Home              Anticipated d/c date is: 2 days              Patient currently is not medically stable to d/c.   Difficult to place patient No     Code Status:     Code Status Orders  (From admission, onward)         Start     Ordered   11/24/20 0235  Full code  Continuous        11/24/20 0235        Code Status History    Date Active Date Inactive Code Status Order ID Comments User Context   10/23/2020 0751 10/25/2020 2103 Full Code [id number]  Vassie Loll, MD ED   Advance Care Planning Activity        IV Access:    Peripheral IV   Procedures and diagnostic studies:   DG Chest 2 View  Result Date: 11/23/2020 CLINICAL DATA:  Dyspnea. Tachycardic. Leg swelling, rule out congestive heart failure. EXAM: CHEST - 2 VIEW COMPARISON:  CT chest 11/04/2020, chest x-ray 11/04/2020, chest x-ray 10/23/2020 FINDINGS: The heart size and mediastinal contours are unchanged. Improved bibasilar patchy airspace opacities with no focal consolidation. Similar-appearing increased interstitial markings with no overt pulmonary edema. No pleural effusion. No pneumothorax. No acute osseous abnormality. Multilevel degenerative changes of the spine.  IMPRESSION: Similar-appearing creased interstitial markings with no overt pulmonary edema. Electronically Signed   By: Tish Frederickson M.D.   On: 11/23/2020 23:21     Medical Consultants:    None.   Subjective:    Melanie Reeves she relates her breathing is not improved she continues to be significantly short of breath.  Objective:    Vitals:   11/24/20 0015 11/24/20 0030 11/24/20 0256 11/24/20 0600  BP: 109/90 111/62   133/71  Pulse: 95 91  96  Resp: (!) 23 (!) 25  (!) 21  Temp:   98.1 F (36.7 C) 98.1 F (36.7 C)  TempSrc:   Oral Oral  SpO2: 92% 98% 96% 97%   SpO2: 97 % O2 Flow Rate (L/min): 2 L/min  No intake or output data in the 24 hours ending 11/24/20 0712 There were no vitals filed for this visit.  Exam: General exam: In no acute distress, she cannot speak in full sentences. Respiratory system: Poor air movement with wheezing bilaterally. Cardiovascular system: S1 & S2 heard, RRR. No JVD. Gastrointestinal system: Abdomen is nondistended, soft and nontender.  Extremities: No pedal edema. Skin: No rashes, lesions or ulcers Psychiatry: Judgement and insight appear normal. Mood & affect appropriate.    Data Reviewed:    Labs: Basic Metabolic Panel: Recent Labs  Lab 11/23/20 2312 11/24/20 0352  NA 139 137  K 3.8 3.5  CL 105 104  CO2 21* 20*  GLUCOSE 93 77  BUN 17 14  CREATININE 0.88 0.85  CALCIUM 9.5 9.0  MG 1.9  --    GFR CrCl cannot be calculated (Unknown ideal weight.). Liver Function Tests: Recent Labs  Lab 11/23/20 2312 11/24/20 0352  AST 32 25  ALT 22 20  ALKPHOS 45 41  BILITOT 0.8 0.7  PROT 7.8 6.9  ALBUMIN 2.7* 2.4*   No results for input(s): LIPASE, AMYLASE in the last 168 hours. No results for input(s): AMMONIA in the last 168 hours. Coagulation profile No results for input(s): INR, PROTIME in the last 168 hours. COVID-19 Labs  No results for input(s): DDIMER, FERRITIN, LDH, CRP in the last 72 hours.  No results found for: SARSCOV2NAA  CBC: Recent Labs  Lab 11/23/20 2312 11/24/20 0352  WBC 13.3* 12.7*  NEUTROABS 8.9*  --   HGB 11.9* 10.4*  HCT 37.9 33.3*  MCV 89.8 90.5  PLT 612* 533*   Cardiac Enzymes: No results for input(s): CKTOTAL, CKMB, CKMBINDEX, TROPONINI in the last 168 hours. BNP (last 3 results) No results for input(s): PROBNP in the last 8760 hours. CBG: No results for input(s): GLUCAP in the last 168 hours. D-Dimer: No  results for input(s): DDIMER in the last 72 hours. Hgb A1c: Recent Labs    11/24/20 0352  HGBA1C 8.2*   Lipid Profile: No results for input(s): CHOL, HDL, LDLCALC, TRIG, CHOLHDL, LDLDIRECT in the last 72 hours. Thyroid function studies: Recent Labs    11/24/20 0352  TSH 1.217   Anemia work up: No results for input(s): VITAMINB12, FOLATE, FERRITIN, TIBC, IRON, RETICCTPCT in the last 72 hours. Sepsis Labs: Recent Labs  Lab 11/23/20 2312 11/24/20 0352  PROCALCITON  --  0.18  WBC 13.3* 12.7*   Microbiology No results found for this or any previous visit (from the past 240 hour(s)).   Medications:   . atenolol  25 mg Oral Daily  . budesonide (PULMICORT) nebulizer solution  0.25 mg Nebulization BID  . DULoxetine  90 mg Oral QHS  . fenofibrate  160  mg Oral Daily  . gabapentin  1,200 mg Oral BID  . insulin aspart  0-15 Units Subcutaneous TID WC  . insulin glargine  150 Units Subcutaneous Daily  . ipratropium-albuterol  3 mL Nebulization Q4H  . methylPREDNISolone (SOLU-MEDROL) injection  40 mg Intravenous Daily  . pravastatin  40 mg Oral Daily  . rivaroxaban  20 mg Oral Q supper   Continuous Infusions: . doxycycline (VIBRAMYCIN) IV 100 mg (11/24/20 0702)      LOS: 0 days   Marinda Elk  [facility]  11/24/2020, 7:12 AM

## 2020-11-25 LAB — CBC WITH DIFFERENTIAL/PLATELET
Abs Immature Granulocytes: 0.27 10*3/uL — ABNORMAL HIGH (ref 0.00–0.07)
Basophils Absolute: 0.1 10*3/uL (ref 0.0–0.1)
Basophils Relative: 0 %
Eosinophils Absolute: 0 10*3/uL (ref 0.0–0.5)
Eosinophils Relative: 0 %
HCT: 34.2 % — ABNORMAL LOW (ref 36.0–46.0)
Hemoglobin: 10.7 g/dL — ABNORMAL LOW (ref 12.0–15.0)
Immature Granulocytes: 2 %
Lymphocytes Relative: 16 %
Lymphs Abs: 2.8 10*3/uL (ref 0.7–4.0)
MCH: 28.3 pg (ref 26.0–34.0)
MCHC: 31.3 g/dL (ref 30.0–36.0)
MCV: 90.5 fL (ref 80.0–100.0)
Monocytes Absolute: 0.6 10*3/uL (ref 0.1–1.0)
Monocytes Relative: 4 %
Neutro Abs: 13.6 10*3/uL — ABNORMAL HIGH (ref 1.7–7.7)
Neutrophils Relative %: 78 %
Platelets: 561 10*3/uL — ABNORMAL HIGH (ref 150–400)
RBC: 3.78 MIL/uL — ABNORMAL LOW (ref 3.87–5.11)
RDW: 15.8 % — ABNORMAL HIGH (ref 11.5–15.5)
WBC: 17.4 10*3/uL — ABNORMAL HIGH (ref 4.0–10.5)
nRBC: 0 % (ref 0.0–0.2)

## 2020-11-25 LAB — GLUCOSE, CAPILLARY
Glucose-Capillary: 208 mg/dL — ABNORMAL HIGH (ref 70–99)
Glucose-Capillary: 180 mg/dL — ABNORMAL HIGH (ref 70–99)
Glucose-Capillary: 170 mg/dL — ABNORMAL HIGH (ref 70–99)

## 2020-11-25 MED ORDER — INSULIN ASPART 100 UNIT/ML ~~LOC~~ SOLN
10.0000 [IU] | Freq: Three times a day (TID) | SUBCUTANEOUS | Status: DC
  Administered 2020-11-25 – 2020-11-26 (×4): 10 [IU] via SUBCUTANEOUS

## 2020-11-25 MED ORDER — PREDNISONE 20 MG PO TABS
20.0000 mg | ORAL_TABLET | Freq: Every day | ORAL | Status: DC
  Administered 2020-11-26: 20 mg via ORAL
  Filled 2020-11-25: qty 1

## 2020-11-25 MED ORDER — INSULIN GLARGINE 100 UNIT/ML ~~LOC~~ SOLN
75.0000 [IU] | Freq: Two times a day (BID) | SUBCUTANEOUS | Status: DC
  Administered 2020-11-25 – 2020-11-26 (×3): 75 [IU] via SUBCUTANEOUS
  Filled 2020-11-25 (×4): qty 0.75

## 2020-11-25 MED ORDER — ALBUTEROL SULFATE HFA 108 (90 BASE) MCG/ACT IN AERS
2.0000 | INHALATION_SPRAY | Freq: Four times a day (QID) | RESPIRATORY_TRACT | Status: DC
  Filled 2020-11-25: qty 6.7

## 2020-11-25 NOTE — Evaluation (Signed)
Physical Therapy Evaluation Patient Details Name: Melanie ZOBRIST MRN: [id number] DOB: 1951-06-14 Today's Date: 11/25/2020   History of Present Illness  Melanie Reeves is an 70 y.o. female past medical history significant for paroxysmal atrial fibrillation, left bundle branch block with a 2D echo with grade 1 diastolic heart failure in December 2018, diabetes mellitus type 2 admitted for Covid last month discharged on 10/25/2020 who progressively got more more short of breath started about 1 week prior to admission. Resp panel + for metapneumovirus.  Clinical Impression  Prior to admission, pt is a limited household ambulator using a cane. She participates in a virtual personal training class where she works on mostly seated exercises with resistance. Pt presents likely fairly close to her functional baseline. Ambulating 60 feet with a cane at a supervision level, SpO2 91-95% on RA. Will continue to follow acutely to promote mobility. Don't anticipate need for PT follow up.     Follow Up Recommendations No PT follow up;Supervision for mobility/OOB    Equipment Recommendations  None recommended by PT    Recommendations for Other Services       Precautions / Restrictions Precautions Precautions: Fall Restrictions Weight Bearing Restrictions: No      Mobility  Bed Mobility               General bed mobility comments: Received sitting EOB    Transfers Overall transfer level: Needs assistance Equipment used: Straight cane Transfers: Sit to/from Stand Sit to Stand: Supervision         General transfer comment: Supervision to rise to stand. Increased time  Ambulation/Gait Ambulation/Gait assistance: Supervision Gait Distance (Feet): 60 Feet Assistive device: Straight cane Gait Pattern/deviations: Step-through pattern;Decreased stride length;Trunk flexed Gait velocity: decreased Gait velocity interpretation: <1.31 ft/sec, indicative of household ambulator General Gait  Details: Increased trunk flexion throughout, slow pace, no gross instability  Stairs            Wheelchair Mobility    Modified Rankin (Stroke Patients Only)       Balance Overall balance assessment: Needs assistance Sitting-balance support: Feet supported Sitting balance-Leahy Scale: Good     Standing balance support: Single extremity supported;During functional activity Standing balance-Leahy Scale: Poor Standing balance comment: reliant on single UE support                             Pertinent Vitals/Pain Pain Assessment: No/denies pain    Home Living Family/patient expects to be discharged to:: Private residence Living Arrangements: Spouse/significant other Available Help at Discharge: Family;Available 24 hours/day Type of Home: House Home Access: Stairs to enter Entrance Stairs-Rails: None Entrance Stairs-Number of Steps: 1 Home Layout: One level Home Equipment: Cane - single point;Walker - 2 wheels;Shower seat      Prior Function Level of Independence: Needs assistance   Gait / Transfers Assistance Needed: household ambulator using SPC           Hand Dominance   Dominant Hand: Right    Extremity/Trunk Assessment   Upper Extremity Assessment Upper Extremity Assessment: Overall WFL for tasks assessed    Lower Extremity Assessment Lower Extremity Assessment: Generalized weakness    Cervical / Trunk Assessment Cervical / Trunk Assessment: Other exceptions Cervical / Trunk Exceptions: increased body habitus  Communication   Communication: No difficulties  Cognition Arousal/Alertness: Awake/alert Behavior During Therapy: WFL for tasks assessed/performed Overall Cognitive Status: Within Functional Limits for tasks assessed  General Comments      Exercises     Assessment/Plan    PT Assessment Patient needs continued PT services  PT Problem List Decreased  strength;Decreased activity tolerance;Decreased balance;Decreased mobility       PT Treatment Interventions DME instruction;Gait training;Functional mobility training;Therapeutic activities;Therapeutic exercise;Balance training;Patient/family education    PT Goals (Current goals can be found in the Care Plan section)  Acute Rehab PT Goals Patient Stated Goal: increase activity level PT Goal Formulation: With patient Time For Goal Achievement: 12/09/20 Potential to Achieve Goals: Good    Frequency Min 3X/week   Barriers to discharge        Co-evaluation               AM-PAC PT "6 Clicks" Mobility  Outcome Measure Help needed turning from your back to your side while in a flat bed without using bedrails?: None Help needed moving from lying on your back to sitting on the side of a flat bed without using bedrails?: None Help needed moving to and from a bed to a chair (including a wheelchair)?: None Help needed standing up from a chair using your arms (e.g., wheelchair or bedside chair)?: None Help needed to walk in hospital room?: A Little Help needed climbing 3-5 steps with a railing? : A Lot 6 Click Score: 21    End of Session   Activity Tolerance: Patient tolerated treatment well Patient left: in bed;with call bell/phone within reach   PT Visit Diagnosis: Unsteadiness on feet (R26.81);Muscle weakness (generalized) (M62.81);Difficulty in walking, not elsewhere classified (R26.2)    Time: 3151-7616 PT Time Calculation (min) (ACUTE ONLY): 20 min   Charges:   PT Evaluation $PT Eval Moderate Complexity: 1 Mod          Lillia Pauls, PT, DPT Acute Rehabilitation Services Pager [phone] Office [phone]   Norval Morton 11/25/2020, 3:30 PM

## 2020-11-25 NOTE — Progress Notes (Signed)
[facility] PROGRESS NOTE    Progress Note  Melanie Reeves  FIE:[id number] DOB: 02-22-1951 DOA: 11/23/2020 PCP: Ileana Ladd, MD     Brief Narrative:   Tesha Archambeau Cly is an 70 y.o. female past medical history significant for paroxysmal atrial fibrillation, left bundle branch block with a 2D echo with grade 1 diastolic heart failure in December 2018, diabetes mellitus type 2 admitted for Covid last month discharged on 10/25/2020 who progressively got more more short of breath started about 1 week prior to admission.  Significant Events: 10/25/2020  Significant studies: 11/04/2020 CT angio of the chest showed no PE but residual multifocal pneumonia. 11/24/2020 chest x-ray that showed that showed increased interstitial marking bilaterally  Antibiotics: 10/24/2020 Rocephin and doxycycline  Microbiology data: Blood culture:  Procedures: None  Assessment/Plan:   Acute respiratory failure with hypoxia due to viral pneumonia: She still requiring 2 L of oxygen to keep saturations greater than 92%. She still dyspneic but her wheezing is improved continue to taper down her steroids. Resp panel + for metapneumovirus. Stop abx. We will try to wean to room air ambulating check saturations with ambulation.  Elevated high sensitive troponins: 2D echo showed an EF of 55% with no wall motion abnormalities, with a grade 1 diastolic heart failure. She denies any chest pain. Elevated cardiac biomarkers likely due to demand ischemia.  Paroxysmal atrial fibrillation: Rate controlled on metoprolol continue Xarelto.  Diabetes mellitus type 2: With a last A1c of 8.2. We will change her long-acting insulin to twice daily 75 units. Increase her with meal coverage to 10 continue CBGs before meals and at bedtime.  Essential hypertension: Continue atenolol pressure is relatively well controlled.  OSA (obstructive sleep apnea) C-pap at -Night.   DVT prophylaxis: lovenox Family  Communication:none Status is: Observation  The patient will require care spanning > 2 midnights and should be moved to inpatient because: Hemodynamically unstable  Dispo: The patient is from: Home              Anticipated d/c is to: Home              Anticipated d/c date is: 2 days              Patient currently is not medically stable to d/c.   Difficult to place patient No     Code Status:     Code Status Orders  (From admission, onward)         Start     Ordered   11/24/20 0235  Full code  Continuous        11/24/20 0235        Code Status History    Date Active Date Inactive Code Status Order ID Comments User Context   10/23/2020 0751 10/25/2020 2103 Full Code [id number]  Vassie Loll, MD ED   Advance Care Planning Activity        IV Access:    Peripheral IV   Procedures and diagnostic studies:   DG Chest 2 View  Result Date: 11/23/2020 CLINICAL DATA:  Dyspnea. Tachycardic. Leg swelling, rule out congestive heart failure. EXAM: CHEST - 2 VIEW COMPARISON:  CT chest 11/04/2020, chest x-ray 11/04/2020, chest x-ray 10/23/2020 FINDINGS: The heart size and mediastinal contours are unchanged. Improved bibasilar patchy airspace opacities with no focal consolidation. Similar-appearing increased interstitial markings with no overt pulmonary edema. No pleural effusion. No pneumothorax. No acute osseous abnormality. Multilevel degenerative changes of the spine. IMPRESSION: Similar-appearing creased interstitial markings with no  overt pulmonary edema. Electronically Signed   By: Tish Frederickson M.D.   On: 11/23/2020 23:21   ECHOCARDIOGRAM COMPLETE  Result Date: 11/24/2020    ECHOCARDIOGRAM REPORT   Patient Name:   Melanie Reeves Date of Exam: 11/24/2020 Medical Rec #:  [id number]      Height:       62.5 in Accession #:    [id number]     Weight:       300.0 lb Date of Birth:  December 09, 1950       BSA:          2.285 m Patient Age:    69 years       BP:           158/81 mmHg Patient  Gender: F              HR:           89 bpm. Exam Location:  Inpatient Procedure: 2D Echo, Color Doppler and Cardiac Doppler Indications:    R06.9 DOE  History:        Patient has prior history of Echocardiogram examinations, most                 recent 09/30/2017. Arrythmias:Atrial Fibrillation; Risk                 Factors:Hypertension, Diabetes, Dyslipidemia, Sleep Apnea and                 COVID+ 10/23/20.  Sonographer:    Irving Burton Senior RDCS Referring Phys: [phone] Meryle Ready [facility]  Sonographer Comments: Technically difficult study due to body habitus. IMPRESSIONS  1. Left ventricular ejection fraction, by estimation, is 55 to 60%. The left ventricle has normal function. The left ventricle has no regional wall motion abnormalities. There is moderate concentric left ventricular hypertrophy. Left ventricular diastolic parameters are consistent with Grade I diastolic dysfunction (impaired relaxation).  2. Right ventricular systolic function is normal. The right ventricular size is normal. There is normal pulmonary artery systolic pressure.  3. The mitral valve is normal in structure. Mild mitral valve regurgitation. No evidence of mitral stenosis.  4. The aortic valve is tricuspid. Aortic valve regurgitation is not visualized. No aortic stenosis is present. Aortic valve mean gradient measures 9.0 mmHg.  5. The inferior vena cava is normal in size with greater than 50% respiratory variability, suggesting right atrial pressure of 3 mmHg. Comparison(s): A prior study was performed on 09/20/2017. No significant change from prior study. Prior images reviewed side by side. FINDINGS  Left Ventricle: Left ventricular ejection fraction, by estimation, is 55 to 60%. The left ventricle has normal function. The left ventricle has no regional wall motion abnormalities. The left ventricular internal cavity size was normal in size. There is  moderate concentric left ventricular hypertrophy. Left ventricular diastolic parameters are  consistent with Grade I diastolic dysfunction (impaired relaxation). Right Ventricle: The right ventricular size is normal. No increase in right ventricular wall thickness. Right ventricular systolic function is normal. There is normal pulmonary artery systolic pressure. The tricuspid regurgitant velocity is 2.63 m/s, and  with an assumed right atrial pressure of 3 mmHg, the estimated right ventricular systolic pressure is 30.7 mmHg. Left Atrium: Left atrial size was normal in size. Right Atrium: Right atrial size was normal in size. Pericardium: There is no evidence of pericardial effusion. Mitral Valve: The mitral valve is normal in structure. There is mild thickening of the mitral valve leaflet(s). Mild mitral valve regurgitation. No  evidence of mitral valve stenosis. Tricuspid Valve: The tricuspid valve is grossly normal. Tricuspid valve regurgitation is trivial. Aortic Valve: The aortic valve is tricuspid. Aortic valve regurgitation is not visualized. No aortic stenosis is present. Aortic valve mean gradient measures 9.0 mmHg. Pulmonic Valve: The pulmonic valve was not well visualized. Pulmonic valve regurgitation is not visualized. No evidence of pulmonic stenosis. Aorta: The aortic root is normal in size and structure and the ascending aorta was not well visualized. Venous: The inferior vena cava is normal in size with greater than 50% respiratory variability, suggesting right atrial pressure of 3 mmHg. IAS/Shunts: The atrial septum is grossly normal.  LEFT VENTRICLE PLAX 2D LVIDd:         4.50 cm  Diastology LVIDs:         3.10 cm  LV e' medial:    6.96 cm/s LV PW:         1.30 cm  LV E/e' medial:  12.3 LV IVS:        1.30 cm  LV e' lateral:   6.85 cm/s LVOT diam:     2.00 cm  LV E/e' lateral: 12.5 LV SV:         82 LV SV Index:   36 LVOT Area:     3.14 cm  RIGHT VENTRICLE RV S prime:     15.10 cm/s TAPSE (M-mode): 2.9 cm LEFT ATRIUM             Index       RIGHT ATRIUM           Index LA diam:        3.80  cm 1.66 cm/m  RA Area:     18.30 cm LA Vol (A2C):   56.0 ml 24.51 ml/m RA Volume:   52.60 ml  23.02 ml/m LA Vol (A4C):   42.5 ml 18.60 ml/m LA Biplane Vol: 49.6 ml 21.71 ml/m  AORTIC VALVE AV Mean Grad: 9.0 mmHg LVOT Vmax:    135.00 cm/s LVOT Vmean:   106.000 cm/s LVOT VTI:     0.260 m  AORTA Ao Root diam: 2.70 cm MITRAL VALVE               TRICUSPID VALVE MV Area (PHT): 2.39 cm    TR Peak grad:   27.7 mmHg MV Decel Time: 317 msec    TR Vmax:        263.00 cm/s MV E velocity: 85.70 cm/s MV A velocity: 93.00 cm/s  SHUNTS MV E/A ratio:  0.92        Systemic VTI:  0.26 m                            Systemic Diam: 2.00 cm Riley Lam MD Electronically signed by Riley Lam MD Signature Date/Time: 11/24/2020/1:30:46 PM    Final      Medical Consultants:    None.   Subjective:    Melanie Reeves relates her breathing is significantly improved compared to yesterday.  Objective:    Vitals:   11/25/20 0427 11/25/20 0500 11/25/20 0834 11/25/20 0837  BP: (!) 143/77     Pulse: 79  80   Resp:   18   Temp: (!) 97.5 F (36.4 C)     TempSrc: Oral     SpO2: 100%  100% 100%  Weight:  134.4 kg     SpO2: 100 % O2 Flow Rate (L/min): 2 L/min FiO2 (%):  28 %   Intake/Output Summary (Last 24 hours) at 11/25/2020 0912 Last data filed at 11/25/2020 0651 Gross per 24 hour  Intake 340 ml  Output --  Net 340 ml   Filed Weights   11/24/20 2019 11/25/20 0500  Weight: 134.4 kg 134.4 kg    Exam: General exam: In no acute distress. Respiratory system: Good air movement and clear to auscultation. Cardiovascular system: S1 & S2 heard, RRR. No JVD. Gastrointestinal system: Abdomen is nondistended, soft and nontender.  Central nervous system: Alert and oriented. No focal neurological deficits. Extremities: No pedal edema. Skin: No rashes, lesions or ulcers Psychiatry: Judgement and insight appear normal. Mood & affect appropriate.   Data Reviewed:    Labs: Basic Metabolic  Panel: Recent Labs  Lab 11/23/20 2312 11/24/20 0352  NA 139 137  K 3.8 3.5  CL 105 104  CO2 21* 20*  GLUCOSE 93 77  BUN 17 14  CREATININE 0.88 0.85  CALCIUM 9.5 9.0  MG 1.9  --    GFR Estimated Creatinine Clearance: 85.7 mL/min (by C-G formula based on SCr of 0.85 mg/dL). Liver Function Tests: Recent Labs  Lab 11/23/20 2312 11/24/20 0352  AST 32 25  ALT 22 20  ALKPHOS 45 41  BILITOT 0.8 0.7  PROT 7.8 6.9  ALBUMIN 2.7* 2.4*   No results for input(s): LIPASE, AMYLASE in the last 168 hours. No results for input(s): AMMONIA in the last 168 hours. Coagulation profile No results for input(s): INR, PROTIME in the last 168 hours. COVID-19 Labs  No results for input(s): DDIMER, FERRITIN, LDH, CRP in the last 72 hours.  No results found for: SARSCOV2NAA  CBC: Recent Labs  Lab 11/23/20 2312 11/24/20 0352  WBC 13.3* 12.7*  NEUTROABS 8.9*  --   HGB 11.9* 10.4*  HCT 37.9 33.3*  MCV 89.8 90.5  PLT 612* 533*   Cardiac Enzymes: No results for input(s): CKTOTAL, CKMB, CKMBINDEX, TROPONINI in the last 168 hours. BNP (last 3 results) No results for input(s): PROBNP in the last 8760 hours. CBG: Recent Labs  Lab 11/24/20 0817 11/24/20 1210 11/24/20 1728 11/24/20 2234 11/25/20 0908  GLUCAP 141* 180* 267* 264* 208*   D-Dimer: No results for input(s): DDIMER in the last 72 hours. Hgb A1c: Recent Labs    11/24/20 0352  HGBA1C 8.2*   Lipid Profile: No results for input(s): CHOL, HDL, LDLCALC, TRIG, CHOLHDL, LDLDIRECT in the last 72 hours. Thyroid function studies: Recent Labs    11/24/20 0352  TSH 1.217   Anemia work up: No results for input(s): VITAMINB12, FOLATE, FERRITIN, TIBC, IRON, RETICCTPCT in the last 72 hours. Sepsis Labs: Recent Labs  Lab 11/23/20 2312 11/24/20 0352  PROCALCITON  --  0.18  WBC 13.3* 12.7*   Microbiology Recent Results (from the past 240 hour(s))  Respiratory (~20 pathogens) panel by PCR     Status: Abnormal   Collection  Time: 11/24/20  8:52 AM   Specimen: Nasopharyngeal Swab; Respiratory  Result Value Ref Range Status   Adenovirus NOT DETECTED NOT DETECTED Final   Coronavirus 229E NOT DETECTED NOT DETECTED Final    Comment: (NOTE) The Coronavirus on the Respiratory Panel, DOES NOT test for the novel  Coronavirus (2019 nCoV)    Coronavirus HKU1 NOT DETECTED NOT DETECTED Final   Coronavirus NL63 NOT DETECTED NOT DETECTED Final   Coronavirus OC43 NOT DETECTED NOT DETECTED Final   Metapneumovirus DETECTED (A) NOT DETECTED Final   Rhinovirus / Enterovirus NOT DETECTED NOT DETECTED Final  Influenza A NOT DETECTED NOT DETECTED Final   Influenza B NOT DETECTED NOT DETECTED Final   Parainfluenza Virus 1 NOT DETECTED NOT DETECTED Final   Parainfluenza Virus 2 NOT DETECTED NOT DETECTED Final   Parainfluenza Virus 3 NOT DETECTED NOT DETECTED Final   Parainfluenza Virus 4 NOT DETECTED NOT DETECTED Final   Respiratory Syncytial Virus NOT DETECTED NOT DETECTED Final   Bordetella pertussis NOT DETECTED NOT DETECTED Final   Bordetella Parapertussis NOT DETECTED NOT DETECTED Final   Chlamydophila pneumoniae NOT DETECTED NOT DETECTED Final   Mycoplasma pneumoniae NOT DETECTED NOT DETECTED Final    Comment: Performed at [facility], [address] [place], [place] [address]     Medications:   . atenolol  25 mg Oral Daily  . budesonide (PULMICORT) nebulizer solution  0.25 mg Nebulization BID  . DULoxetine  90 mg Oral QHS  . fenofibrate  160 mg Oral Daily  . gabapentin  1,200 mg Oral BID  . insulin aspart  0-15 Units Subcutaneous TID WC  . insulin aspart  0-5 Units Subcutaneous QHS  . insulin aspart  6 Units Subcutaneous TID WC  . insulin glargine  150 Units Subcutaneous Daily  . ipratropium-albuterol  3 mL Nebulization BID  . methylPREDNISolone (SOLU-MEDROL) injection  40 mg Intravenous Daily  . pravastatin  40 mg Oral Daily  . rivaroxaban  20 mg Oral Q supper   Continuous Infusions: . cefTRIAXone  (ROCEPHIN)  IV Stopped (11/24/20 0920)  . doxycycline (VIBRAMYCIN) IV 100 mg (11/24/20 2147)      LOS: 1 day   Marinda ElkAbraham Feliz Ortiz  [facility]  11/25/2020, 9:12 AM

## 2020-11-25 NOTE — Progress Notes (Signed)
Progress Note  Patient Name: Melanie Reeves Date of Encounter: 11/25/2020  Primary Cardiologist: Dietrich Pates, MD   Subjective   No events overnight.  Echo unchanged from prior.  No tachycardia overnight.  Feels better.  Inpatient Medications    Scheduled Meds:  atenolol  25 mg Oral Daily   budesonide (PULMICORT) nebulizer solution  0.25 mg Nebulization BID   DULoxetine  90 mg Oral QHS   fenofibrate  160 mg Oral Daily   gabapentin  1,200 mg Oral BID   insulin aspart  0-15 Units Subcutaneous TID WC   insulin aspart  0-5 Units Subcutaneous QHS   insulin aspart  10 Units Subcutaneous TID WC   insulin glargine  75 Units Subcutaneous BID   ipratropium-albuterol  3 mL Nebulization BID   pravastatin  40 mg Oral Daily   [START ON 11/26/2020] predniSONE  20 mg Oral Q breakfast   rivaroxaban  20 mg Oral Q supper   Continuous Infusions:   PRN Meds: acetaminophen **OR** acetaminophen, albuterol   Vital Signs    Vitals:   11/25/20 0500 11/25/20 0834 11/25/20 0837 11/25/20 0909  BP:    140/78  Pulse:  80  82  Resp:  18  18  Temp:    97.8 F (36.6 C)  TempSrc:    Oral  SpO2:  100% 100% 100%  Weight: 134.4 kg       Intake/Output Summary (Last 24 hours) at 11/25/2020 1121 Last data filed at 11/25/2020 0900 Gross per 24 hour  Intake 480 ml  Output 0 ml  Net 480 ml   Filed Weights   11/24/20 2019 11/25/20 0500  Weight: 134.4 kg 134.4 kg    Telemetry    SR predominantly- Personally Reviewed  ECG    No new - Personally Reviewed  Physical Exam   GEN: No acute distress.   Neck: No JVD Cardiac: RRR, no murmurs, rubs, or gallops.  Respiratory: Clear to auscultation bilaterally, wheezes throughout GI: Soft, nontender, non-distended  MS: No edema; No deformity. Neuro:  Nonfocal  Psych: Normal affect   Labs    Chemistry Recent Labs  Lab 11/23/20 2312 11/24/20 0352  NA 139 137  K 3.8 3.5  CL 105 104  CO2 21* 20*  GLUCOSE 93 77  BUN 17 14  CREATININE 0.88  0.85  CALCIUM 9.5 9.0  PROT 7.8 6.9  ALBUMIN 2.7* 2.4*  AST 32 25  ALT 22 20  ALKPHOS 45 41  BILITOT 0.8 0.7  GFRNONAA >60 >60  ANIONGAP 13 13     Hematology Recent Labs  Lab 11/23/20 2312 11/24/20 0352 11/25/20 1008  WBC 13.3* 12.7* 17.4*  RBC 4.22 3.68* 3.78*  HGB 11.9* 10.4* 10.7*  HCT 37.9 33.3* 34.2*  MCV 89.8 90.5 90.5  MCH 28.2 28.3 28.3  MCHC 31.4 31.2 31.3  RDW 16.0* 15.8* 15.8*  PLT 612* 533* 561*    Cardiac EnzymesNo results for input(s): TROPONINI in the last 168 hours. No results for input(s): TROPIPOC in the last 168 hours.   BNP Recent Labs  Lab 11/24/20 0110  BNP 79.3     DDimer No results for input(s): DDIMER in the last 168 hours.   Radiology    DG Chest 2 View  Result Date: 11/23/2020 CLINICAL DATA:  Dyspnea. Tachycardic. Leg swelling, rule out congestive heart failure. EXAM: CHEST - 2 VIEW COMPARISON:  CT chest 11/04/2020, chest x-ray 11/04/2020, chest x-ray 10/23/2020 FINDINGS: The heart size and mediastinal contours are unchanged. Improved bibasilar  patchy airspace opacities with no focal consolidation. Similar-appearing increased interstitial markings with no overt pulmonary edema. No pleural effusion. No pneumothorax. No acute osseous abnormality. Multilevel degenerative changes of the spine. IMPRESSION: Similar-appearing creased interstitial markings with no overt pulmonary edema. Electronically Signed   By: Tish Frederickson M.D.   On: 11/23/2020 23:21   ECHOCARDIOGRAM COMPLETE  Result Date: 11/24/2020    ECHOCARDIOGRAM REPORT   Patient Name:   Melanie Reeves Date of Exam: 11/24/2020 Medical Rec #:  [id number]      Height:       62.5 in Accession #:    [id number]     Weight:       300.0 lb Date of Birth:  September 16, 1951       BSA:          2.285 m Patient Age:    69 years       BP:           158/81 mmHg Patient Gender: F              HR:           89 bpm. Exam Location:  Inpatient Procedure: 2D Echo, Color Doppler and Cardiac Doppler Indications:     R06.9 DOE  History:        Patient has prior history of Echocardiogram examinations, most                 recent 09/30/2017. Arrythmias:Atrial Fibrillation; Risk                 Factors:Hypertension, Diabetes, Dyslipidemia, Sleep Apnea and                 COVID+ 10/23/20.  Sonographer:    Irving Burton Senior RDCS Referring Phys: [phone] Meryle Ready [facility]  Sonographer Comments: Technically difficult study due to body habitus. IMPRESSIONS  1. Left ventricular ejection fraction, by estimation, is 55 to 60%. The left ventricle has normal function. The left ventricle has no regional wall motion abnormalities. There is moderate concentric left ventricular hypertrophy. Left ventricular diastolic parameters are consistent with Grade I diastolic dysfunction (impaired relaxation).  2. Right ventricular systolic function is normal. The right ventricular size is normal. There is normal pulmonary artery systolic pressure.  3. The mitral valve is normal in structure. Mild mitral valve regurgitation. No evidence of mitral stenosis.  4. The aortic valve is tricuspid. Aortic valve regurgitation is not visualized. No aortic stenosis is present. Aortic valve mean gradient measures 9.0 mmHg.  5. The inferior vena cava is normal in size with greater than 50% respiratory variability, suggesting right atrial pressure of 3 mmHg. Comparison(s): A prior study was performed on 09/20/2017. No significant change from prior study. Prior images reviewed side by side. FINDINGS  Left Ventricle: Left ventricular ejection fraction, by estimation, is 55 to 60%. The left ventricle has normal function. The left ventricle has no regional wall motion abnormalities. The left ventricular internal cavity size was normal in size. There is  moderate concentric left ventricular hypertrophy. Left ventricular diastolic parameters are consistent with Grade I diastolic dysfunction (impaired relaxation). Right Ventricle: The right ventricular size is normal. No increase in  right ventricular wall thickness. Right ventricular systolic function is normal. There is normal pulmonary artery systolic pressure. The tricuspid regurgitant velocity is 2.63 m/s, and  with an assumed right atrial pressure of 3 mmHg, the estimated right ventricular systolic pressure is 30.7 mmHg. Left Atrium: Left atrial size was normal in size. Right Atrium:  Right atrial size was normal in size. Pericardium: There is no evidence of pericardial effusion. Mitral Valve: The mitral valve is normal in structure. There is mild thickening of the mitral valve leaflet(s). Mild mitral valve regurgitation. No evidence of mitral valve stenosis. Tricuspid Valve: The tricuspid valve is grossly normal. Tricuspid valve regurgitation is trivial. Aortic Valve: The aortic valve is tricuspid. Aortic valve regurgitation is not visualized. No aortic stenosis is present. Aortic valve mean gradient measures 9.0 mmHg. Pulmonic Valve: The pulmonic valve was not well visualized. Pulmonic valve regurgitation is not visualized. No evidence of pulmonic stenosis. Aorta: The aortic root is normal in size and structure and the ascending aorta was not well visualized. Venous: The inferior vena cava is normal in size with greater than 50% respiratory variability, suggesting right atrial pressure of 3 mmHg. IAS/Shunts: The atrial septum is grossly normal.  LEFT VENTRICLE PLAX 2D LVIDd:         4.50 cm  Diastology LVIDs:         3.10 cm  LV e' medial:    6.96 cm/s LV PW:         1.30 cm  LV E/e' medial:  12.3 LV IVS:        1.30 cm  LV e' lateral:   6.85 cm/s LVOT diam:     2.00 cm  LV E/e' lateral: 12.5 LV SV:         82 LV SV Index:   36 LVOT Area:     3.14 cm  RIGHT VENTRICLE RV S prime:     15.10 cm/s TAPSE (M-mode): 2.9 cm LEFT ATRIUM             Index       RIGHT ATRIUM           Index LA diam:        3.80 cm 1.66 cm/m  RA Area:     18.30 cm LA Vol (A2C):   56.0 ml 24.51 ml/m RA Volume:   52.60 ml  23.02 ml/m LA Vol (A4C):   42.5 ml 18.60  ml/m LA Biplane Vol: 49.6 ml 21.71 ml/m  AORTIC VALVE AV Mean Grad: 9.0 mmHg LVOT Vmax:    135.00 cm/s LVOT Vmean:   106.000 cm/s LVOT VTI:     0.260 m  AORTA Ao Root diam: 2.70 cm MITRAL VALVE               TRICUSPID VALVE MV Area (PHT): 2.39 cm    TR Peak grad:   27.7 mmHg MV Decel Time: 317 msec    TR Vmax:        263.00 cm/s MV E velocity: 85.70 cm/s MV A velocity: 93.00 cm/s  SHUNTS MV E/A ratio:  0.92        Systemic VTI:  0.26 m                            Systemic Diam: 2.00 cm Riley Lam MD Electronically signed by Riley Lam MD Signature Date/Time: 11/24/2020/1:30:46 PM    Final     Cardiac Studies   No change in LV function  Patient Profile     70 y.o. female Worsening SOB  Assessment & Plan    Chest pain       - borderline elevated troponin 76 --> 66              - outpatient stress is reasonable  2. Worsening exertional dyspnea:  likely residua from COVID-19 PAF: Continue on Xarelto and atenolol.  No significant RVR burden here.  Consider outpatient heart monitor (2 week ZioPatch)   Hypertension- continue home anteolol   Hyperlipidemia   Obstructive sleep apnea: She is pending sleep study next week.   DM2   Chronic left bundle branch block  [facility] will sign off.   Medication Recommendations:  AC and anteolol Other recommendations (labs, testing, etc):  Consideration for outpatient stress test and ziopatch Follow up as an outpatient:  Will arrange outpatient f/u  For questions or updates, please contact [facility] Please consult [email or web address] for contact info under Cardiology/STEMI.      Signed, Christell ConstantMahesh A Katharin Schneider, MD  11/25/2020, 11:21 AM

## 2020-11-25 NOTE — Progress Notes (Signed)
Patient Saturations on Room Air at Rest = 93%  Patient Saturations on Room Air while Ambulating = 92%

## 2020-11-26 ENCOUNTER — Telehealth: Payer: Self-pay | Admitting: Internal Medicine

## 2020-11-26 DIAGNOSIS — I10 Essential (primary) hypertension: Secondary | ICD-10-CM

## 2020-11-26 DIAGNOSIS — R778 Other specified abnormalities of plasma proteins: Secondary | ICD-10-CM

## 2020-11-26 LAB — GLUCOSE, CAPILLARY
Glucose-Capillary: 59 mg/dL — ABNORMAL LOW (ref 70–99)
Glucose-Capillary: 107 mg/dL — ABNORMAL HIGH (ref 70–99)
Glucose-Capillary: 111 mg/dL — ABNORMAL HIGH (ref 70–99)

## 2020-11-26 LAB — CBC WITH DIFFERENTIAL/PLATELET
Abs Immature Granulocytes: 0.28 10*3/uL — ABNORMAL HIGH (ref 0.00–0.07)
Basophils Absolute: 0.1 10*3/uL (ref 0.0–0.1)
Basophils Relative: 1 %
Eosinophils Absolute: 0.1 10*3/uL (ref 0.0–0.5)
Eosinophils Relative: 1 %
HCT: 35.4 % — ABNORMAL LOW (ref 36.0–46.0)
Hemoglobin: 11.2 g/dL — ABNORMAL LOW (ref 12.0–15.0)
Immature Granulocytes: 2 %
Lymphocytes Relative: 13 %
Lymphs Abs: 2.1 10*3/uL (ref 0.7–4.0)
MCH: 28.1 pg (ref 26.0–34.0)
MCHC: 31.6 g/dL (ref 30.0–36.0)
MCV: 88.7 fL (ref 80.0–100.0)
Monocytes Absolute: 0.5 10*3/uL (ref 0.1–1.0)
Monocytes Relative: 3 %
Neutro Abs: 12.6 10*3/uL — ABNORMAL HIGH (ref 1.7–7.7)
Neutrophils Relative %: 80 %
Platelets: 575 10*3/uL — ABNORMAL HIGH (ref 150–400)
RBC: 3.99 MIL/uL (ref 3.87–5.11)
RDW: 15.8 % — ABNORMAL HIGH (ref 11.5–15.5)
WBC: 15.7 10*3/uL — ABNORMAL HIGH (ref 4.0–10.5)
nRBC: 0 % (ref 0.0–0.2)

## 2020-11-26 MED ORDER — IPRATROPIUM-ALBUTEROL 0.5-2.5 (3) MG/3ML IN SOLN: 3.0000 mL | Freq: Four times a day (QID) | RESPIRATORY_TRACT | Status: DC | PRN

## 2020-11-26 MED ORDER — PREDNISONE 10 MG PO TABS: ORAL_TABLET | ORAL | 0 refills | Status: DC

## 2020-11-26 NOTE — Telephone Encounter (Signed)
Patient has TOC appointment 11/30/2020 at 2:20 pm with Dr. Tenny Craw per staff message from [facility].

## 2020-11-26 NOTE — Discharge Instructions (Signed)
Information on my medicine - XARELTO (Rivaroxaban)  This medication education was reviewed with me or my healthcare representative as part of my discharge preparation. You were taking this medication prior to this hospital admission.    Why was Xarelto prescribed for you? Xarelto was prescribed for you to reduce the risk of a blood clot forming that can cause a stroke if you have a medical condition called atrial fibrillation (a type of irregular heartbeat).  What do you need to know about xarelto ? Take your Xarelto ONCE DAILY at the same time every day with your evening meal. If you have difficulty swallowing the tablet whole, you may crush it and mix in applesauce just prior to taking your dose.  Take Xarelto exactly as prescribed by your doctor and DO NOT stop taking Xarelto without talking to the doctor who prescribed the medication.  Stopping without other stroke prevention medication to take the place of Xarelto may increase your risk of developing a clot that causes a stroke.  Refill your prescription before you run out.  After discharge, you should have regular check-up appointments with your healthcare provider that is prescribing your Xarelto.  In the future your dose may need to be changed if your kidney function or weight changes by a significant amount.  What do you do if you miss a dose? If you are taking Xarelto ONCE DAILY and you miss a dose, take it as soon as you remember on the same day then continue your regularly scheduled once daily regimen the next day. Do not take two doses of Xarelto at the same time or on the same day.   Important Safety Information A possible side effect of Xarelto is bleeding. You should call your healthcare provider right away if you experience any of the following: Bleeding from an injury or your nose that does not stop. Unusual colored urine (red or dark brown) or unusual colored stools (red or black). Unusual bruising for unknown  reasons. A serious fall or if you hit your head (even if there is no bleeding).  Some medicines may interact with Xarelto and might increase your risk of bleeding while on Xarelto. To help avoid this, consult your healthcare provider or pharmacist prior to using any new prescription or non-prescription medications, including herbals, vitamins, non-steroidal anti-inflammatory drugs (NSAIDs) and supplements.  This website has more information on Xarelto: www.xarelto.com.  

## 2020-11-26 NOTE — Discharge Summary (Signed)
Physician Discharge Summary  Melanie Reeves MBW:[id number] DOB: 1950-11-18 DOA: 11/23/2020  PCP: Vernie Shanks, MD  Admit date: 11/23/2020 Discharge date: 11/26/2020  Admitted From: home Disposition:  Home  Recommendations for Outpatient Follow-up:  1. Follow up with PCP in 1-2 weeks 2. Please obtain BMP/CBC in one week 3. She will follow up with PCP and need to monitor blood glucose and counseled carbohydrate consumption  Home Health:No Equipment/Devices:None  Discharge Condition:Stable CODE STATUS:Full Diet recommendation: Heart Healthy  Brief/Interim Summary: 70 y.o. female past medical history significant for paroxysmal atrial fibrillation, left bundle branch block with a 2D echo with grade 1 diastolic heart failure in December 2018, diabetes mellitus type 2 admitted for Covid last month discharged on 10/25/2020 who progressively got more more short of breath started about 1 week prior to admission.  Significant studies: 11/04/2020 CT angio of the chest showed no PE but residual multifocal pneumonia. 11/24/2020 chest x-ray that showed that showed increased interstitial marking bilaterally  Antibiotics: 10/24/2020 Rocephin and doxycycline  Microbiology data: Blood culture:  Discharge Diagnoses:  Principal Problem:   Exertional dyspnea Active Problems:   Essential hypertension   OSA (obstructive sleep apnea)   Paroxysmal atrial fibrillation (HCC)   Diabetes mellitus ([place])   Acute respiratory failure with hypoxia (HCC) Acute respiratory failure with hypoxia due to metapneumovirus: She had been requiring 2 L since her discharge from the hospital for COVID-19. She was started empirically on antibiotics and steroids she was wheezing.  She started to improve she was weaned to room air. Her respiratory panel was positive for metapneumovirus antibiotics were stopped she was ambulated and her sats were checked with ambulation and they remain both 96% she will go home off  oxygen.  Elevated high sensitive troponins: Likely demand ischemia in the setting of hypoxia 2D echo showed no wall motion abnormalities and EF of 55% and grade 1 diastolic heart failure.  Paroxysmal atrial fibrillation: No changes made to her medication rate controlled metoprolol continue Xarelto.  Diabetes mellitus type 2: With a last A1c of 8.2, no changes made to her medication she will continue her home regimen follow-up with PCP and titrate insulin as needed, we have to counsel her on diet outside think her major problem is her carb consumption.  Essential hypertension: No change made to her medication.  Try to sleep apnea: CPAP at night.   Discharge Instructions  Discharge Instructions    Diet - low sodium heart healthy   Complete by: As directed    Increase activity slowly   Complete by: As directed      Allergies as of 11/26/2020      Reactions   Simvastatin    Other reaction(s): leg weakness Other reaction(s): leg weakness   Sulfa Antibiotics Other (See Comments)   Cant remember Other reaction(s): as a child      Medication List    STOP taking these medications   doxycycline 100 MG capsule Commonly known as: VIBRAMYCIN   OXYGEN     TAKE these medications   Accu-Chek FastClix Lancets Misc Use as instructed to test three times daily. DX: E11.65   atenolol 25 MG tablet Commonly known as: TENORMIN Take 1 tablet (25 mg total) by mouth daily.   B-D ULTRAFINE III SHORT PEN 31G X 8 MM Misc Generic drug: Insulin Pen Needle Use to inject medication 5 times daily.   benzonatate 100 MG capsule Commonly known as: TESSALON Take 1 capsule (100 mg total) by mouth every 8 (eight) hours.  Combivent Respimat 20-100 MCG/ACT Aers respimat Generic drug: Ipratropium-Albuterol Inhale 1 puff into the lungs in the morning, at noon, and at bedtime.   DULoxetine 60 MG capsule Commonly known as: CYMBALTA Take 60 mg by mouth daily. TAKE 1 TABLET BY MOUTH ONCE DAILY.    DULoxetine 30 MG capsule Commonly known as: CYMBALTA TAKE 1 CAPSULE (30 MG TOTAL) BY MOUTH DAILY. TAKE IN ADDITION TO THE 60 MG   fenofibrate 145 MG tablet Commonly known as: TRICOR TAKE 1 TABLET BY MOUTH EVERY DAY   Fish Oil 1000 MG Caps Take 2 capsules by mouth daily.   FreeStyle Libre 2 Reader Kerrin Mo USE AS DIRECTED   FreeStyle Libre 2 Sensor Misc APPLY TO SKIN EVERY 14 (FOURTEEN) DAYS AS DIRECTED   gabapentin 600 MG tablet Commonly known as: NEURONTIN TAKE 1 TABLET BY MOUTH 3 TIMES A DAY AND 1 TABLET AT BEDTIME What changed: See the new instructions.   HYDROcodone-acetaminophen 5-325 MG tablet Commonly known as: NORCO/VICODIN Take 1 tablet by mouth every 6 (six) hours as needed for moderate pain.   Invokana 300 MG Tabs tablet Generic drug: canagliflozin TAKE 1 TABLET (300 MG TOTAL) BY MOUTH DAILY BEFORE BREAKFAST. What changed: See the new instructions.   loratadine 10 MG tablet Commonly known as: CLARITIN Take 10 mg by mouth daily.   metFORMIN 500 MG 24 hr tablet Commonly known as: GLUCOPHAGE-XR TAKE 4 TABLETS (2,000 MG TOTAL) BY MOUTH DAILY WITH SUPPER.   NovoLOG FlexPen 100 UNIT/ML FlexPen Generic drug: insulin aspart INJECT 20-35 UNITS UNDER THE SKIN THREE TIMES DAILY BEFORE MEALS. What changed: See the new instructions.   OneTouch Ultra test strip Generic drug: glucose blood USE AS INSTRUCTED TO CHECK BLOOD SUGAR THREE TIMES DAILY.   OneTouch Verio w/Device Kit UAD to monitor glucose tid; Dx: E11.65   pravastatin 40 MG tablet Commonly known as: PRAVACHOL TAKE 1 TABLET BY MOUTH EVERY DAY   predniSONE 10 MG tablet Commonly known as: DELTASONE Takes 3 tablets for 1 days, then 2 tabs for 1 days, then 1 tab for 1 days, and then stop. What changed:   medication strength  how much to take  how to take this  when to take this  additional instructions   tiZANidine 2 MG tablet Commonly known as: ZANAFLEX Take 2 mg by mouth every 6 (six) hours as  needed for muscle spasms.   Toujeo Max SoloStar 300 UNIT/ML Solostar Pen Generic drug: insulin glargine (2 Unit Dial) INJECT 140 UNITS INTO THE SKIN DAILY. .   Victoza 18 MG/3ML Sopn Generic drug: liraglutide INJECT 0.3 MLS (1.8 MG TOTAL) INTO THE SKIN DAILY. INJECT ONCE DAILY AT THE SAME TIME   Xarelto 20 MG Tabs tablet Generic drug: rivaroxaban TAKE 1 TABLET (20 MG TOTAL) BY MOUTH DAILY WITH SUPPER. What changed: See the new instructions.       Allergies  Allergen Reactions  . Simvastatin     Other reaction(s): leg weakness Other reaction(s): leg weakness  . Sulfa Antibiotics Other (See Comments)    Cant remember Other reaction(s): as a child    Consultations:  None   Procedures/Studies: DG Chest 2 View  Result Date: 11/23/2020 CLINICAL DATA:  Dyspnea. Tachycardic. Leg swelling, rule out congestive heart failure. EXAM: CHEST - 2 VIEW COMPARISON:  CT chest 11/04/2020, chest x-ray 11/04/2020, chest x-ray 10/23/2020 FINDINGS: The heart size and mediastinal contours are unchanged. Improved bibasilar patchy airspace opacities with no focal consolidation. Similar-appearing increased interstitial markings with no overt pulmonary edema. No pleural  effusion. No pneumothorax. No acute osseous abnormality. Multilevel degenerative changes of the spine. IMPRESSION: Similar-appearing creased interstitial markings with no overt pulmonary edema. Electronically Signed   By: Iven Finn M.D.   On: 11/23/2020 23:21   DG Chest 2 View  Result Date: 11/04/2020 CLINICAL DATA:  70 year old female with chest pain. EXAM: CHEST - 2 VIEW COMPARISON:  Chest radiograph dated 10/23/2020. FINDINGS: Bilateral interstitial streaky densities predominantly involving the subpleural and mid to lower lung field likely sequela of COVID-19. Overall improved airspace density compared to prior radiograph. No pleural effusion or pneumothorax. Stable cardiomediastinal silhouette. Atherosclerotic calcification of  the aorta. No acute osseous pathology. Degenerative changes of the spine. IMPRESSION: Bilateral interstitial streaky densities likely sequela of COVID-19. slight interval improvement in the airspace opacity involving the mid to lower lung field compared to prior radiograph. Electronically Signed   By: Anner Crete M.D.   On: 11/04/2020 15:03   CT Angio Chest PE W and/or Wo Contrast  Result Date: 11/05/2020 CLINICAL DATA:  Recent COVID diagnosis. Chest pain and dyspnea. Pulmonary embolus is suspected with high probability. EXAM: CT ANGIOGRAPHY CHEST WITH CONTRAST TECHNIQUE: Multidetector CT imaging of the chest was performed using the standard protocol during bolus administration of intravenous contrast. Multiplanar CT image reconstructions and MIPs were obtained to evaluate the vascular anatomy. CONTRAST:  180mL OMNIPAQUE IOHEXOL 350 MG/ML SOLN COMPARISON:  Chest radiograph 11/04/2020 FINDINGS: Cardiovascular: Motion artifact limits examination. There is moderately good opacification of the central and segmental pulmonary arteries. No focal filling defects. No evidence of significant pulmonary embolus. Cardiac enlargement. No pericardial effusions. Normal caliber thoracic aorta. Scattered calcification. No dissection. Great vessel origins are patent. Mediastinum/Nodes: Esophagus is decompressed. No significant lymphadenopathy in the chest. Lungs/Pleura: Motion artifact limits examination. Peripheral interstitial and alveolar changes in the lungs likely representing multifocal pneumonia although edema could also have this appearance. No focal consolidation. No pleural effusions. No pneumothorax. Upper Abdomen: No acute abnormalities demonstrated in the visualized upper abdomen. Musculoskeletal: No chest wall abnormality. No acute or significant osseous findings. Review of the MIP images confirms the above findings. IMPRESSION: 1. No evidence of significant pulmonary embolus. 2. Peripheral interstitial and  alveolar changes in the lungs likely representing multifocal pneumonia although edema could also have this appearance. 3. Cardiac enlargement. 4. Aortic atherosclerosis. Aortic Atherosclerosis (ICD10-I70.0). Electronically Signed   By: Lucienne Capers M.D.   On: 11/05/2020 00:30   ECHOCARDIOGRAM COMPLETE  Result Date: 11/24/2020    ECHOCARDIOGRAM REPORT   Patient Name:   Melanie Reeves Date of Exam: 11/24/2020 Medical Rec #:  [id number]      Height:       62.5 in Accession #:    [id number]     Weight:       300.0 lb Date of Birth:  [birth year]       BSA:          2.285 m Patient Age:    3 years       BP:           158/81 mmHg Patient Gender: F              HR:           89 bpm. Exam Location:  Inpatient Procedure: 2D Echo, Color Doppler and Cardiac Doppler Indications:    R06.9 DOE  History:        Patient has prior history of Echocardiogram examinations, most  recent 09/30/2017. Arrythmias:Atrial Fibrillation; Risk                 Factors:Hypertension, Diabetes, Dyslipidemia, Sleep Apnea and                 COVID+ 10/23/20.  Sonographer:    Irving Burton Senior RDCS Referring Phys: [phone] Meryle Ready [facility]  Sonographer Comments: Technically difficult study due to body habitus. IMPRESSIONS  1. Left ventricular ejection fraction, by estimation, is 55 to 60%. The left ventricle has normal function. The left ventricle has no regional wall motion abnormalities. There is moderate concentric left ventricular hypertrophy. Left ventricular diastolic parameters are consistent with Grade I diastolic dysfunction (impaired relaxation).  2. Right ventricular systolic function is normal. The right ventricular size is normal. There is normal pulmonary artery systolic pressure.  3. The mitral valve is normal in structure. Mild mitral valve regurgitation. No evidence of mitral stenosis.  4. The aortic valve is tricuspid. Aortic valve regurgitation is not visualized. No aortic stenosis is present. Aortic valve mean gradient  measures 9.0 mmHg.  5. The inferior vena cava is normal in size with greater than 50% respiratory variability, suggesting right atrial pressure of 3 mmHg. Comparison(s): A prior study was performed on 09/20/2017. No significant change from prior study. Prior images reviewed side by side. FINDINGS  Left Ventricle: Left ventricular ejection fraction, by estimation, is 55 to 60%. The left ventricle has normal function. The left ventricle has no regional wall motion abnormalities. The left ventricular internal cavity size was normal in size. There is  moderate concentric left ventricular hypertrophy. Left ventricular diastolic parameters are consistent with Grade I diastolic dysfunction (impaired relaxation). Right Ventricle: The right ventricular size is normal. No increase in right ventricular wall thickness. Right ventricular systolic function is normal. There is normal pulmonary artery systolic pressure. The tricuspid regurgitant velocity is 2.63 m/s, and  with an assumed right atrial pressure of 3 mmHg, the estimated right ventricular systolic pressure is 30.7 mmHg. Left Atrium: Left atrial size was normal in size. Right Atrium: Right atrial size was normal in size. Pericardium: There is no evidence of pericardial effusion. Mitral Valve: The mitral valve is normal in structure. There is mild thickening of the mitral valve leaflet(s). Mild mitral valve regurgitation. No evidence of mitral valve stenosis. Tricuspid Valve: The tricuspid valve is grossly normal. Tricuspid valve regurgitation is trivial. Aortic Valve: The aortic valve is tricuspid. Aortic valve regurgitation is not visualized. No aortic stenosis is present. Aortic valve mean gradient measures 9.0 mmHg. Pulmonic Valve: The pulmonic valve was not well visualized. Pulmonic valve regurgitation is not visualized. No evidence of pulmonic stenosis. Aorta: The aortic root is normal in size and structure and the ascending aorta was not well visualized. Venous:  The inferior vena cava is normal in size with greater than 50% respiratory variability, suggesting right atrial pressure of 3 mmHg. IAS/Shunts: The atrial septum is grossly normal.  LEFT VENTRICLE PLAX 2D LVIDd:         4.50 cm  Diastology LVIDs:         3.10 cm  LV e' medial:    6.96 cm/s LV PW:         1.30 cm  LV E/e' medial:  12.3 LV IVS:        1.30 cm  LV e' lateral:   6.85 cm/s LVOT diam:     2.00 cm  LV E/e' lateral: 12.5 LV SV:         82 LV  SV Index:   36 LVOT Area:     3.14 cm  RIGHT VENTRICLE RV S prime:     15.10 cm/s TAPSE (M-mode): 2.9 cm LEFT ATRIUM             Index       RIGHT ATRIUM           Index LA diam:        3.80 cm 1.66 cm/m  RA Area:     18.30 cm LA Vol (A2C):   56.0 ml 24.51 ml/m RA Volume:   52.60 ml  23.02 ml/m LA Vol (A4C):   42.5 ml 18.60 ml/m LA Biplane Vol: 49.6 ml 21.71 ml/m  AORTIC VALVE AV Mean Grad: 9.0 mmHg LVOT Vmax:    135.00 cm/s LVOT Vmean:   106.000 cm/s LVOT VTI:     0.260 m  AORTA Ao Root diam: 2.70 cm MITRAL VALVE               TRICUSPID VALVE MV Area (PHT): 2.39 cm    TR Peak grad:   27.7 mmHg MV Decel Time: 317 msec    TR Vmax:        263.00 cm/s MV E velocity: 85.70 cm/s MV A velocity: 93.00 cm/s  SHUNTS MV E/A ratio:  0.92        Systemic VTI:  0.26 m                            Systemic Diam: 2.00 cm Riley Lam MD Electronically signed by Riley Lam MD Signature Date/Time: 11/24/2020/1:30:46 PM    Final     (Echo, Carotid, EGD, Colonoscopy, ERCP)    Subjective: No new complaints.  Discharge Exam: Vitals:   11/26/20 0540 11/26/20 0901  BP: 111/72 96/65  Pulse: 82 87  Resp:  18  Temp: 97.6 F (36.4 C) 97.7 F (36.5 C)  SpO2: 95% 92%   Vitals:   11/25/20 2044 11/25/20 2045 11/26/20 0540 11/26/20 0901  BP:   111/72 96/65  Pulse:   82 87  Resp:    18  Temp:   97.6 F (36.4 C) 97.7 F (36.5 C)  TempSrc:   Oral   SpO2: 93% 93% 95% 92%  Weight:        General: Pt is alert, awake, not in acute  distress Cardiovascular: RRR, S1/S2 +, no rubs, no gallops Respiratory: CTA bilaterally, no wheezing, no rhonchi Abdominal: Soft, NT, ND, bowel sounds + Extremities: no edema, no cyanosis    The results of significant diagnostics from this hospitalization (including imaging, microbiology, ancillary and laboratory) are listed below for reference.     Microbiology: Recent Results (from the past 240 hour(s))  Respiratory (~20 pathogens) panel by PCR     Status: Abnormal   Collection Time: 11/24/20  8:52 AM   Specimen: Nasopharyngeal Swab; Respiratory  Result Value Ref Range Status   Adenovirus NOT DETECTED NOT DETECTED Final   Coronavirus 229E NOT DETECTED NOT DETECTED Final    Comment: (NOTE) The Coronavirus on the Respiratory Panel, DOES NOT test for the novel  Coronavirus (2019 nCoV)    Coronavirus HKU1 NOT DETECTED NOT DETECTED Final   Coronavirus NL63 NOT DETECTED NOT DETECTED Final   Coronavirus OC43 NOT DETECTED NOT DETECTED Final   Metapneumovirus DETECTED (A) NOT DETECTED Final   Rhinovirus / Enterovirus NOT DETECTED NOT DETECTED Final   Influenza A NOT DETECTED NOT DETECTED Final   Influenza  B NOT DETECTED NOT DETECTED Final   Parainfluenza Virus 1 NOT DETECTED NOT DETECTED Final   Parainfluenza Virus 2 NOT DETECTED NOT DETECTED Final   Parainfluenza Virus 3 NOT DETECTED NOT DETECTED Final   Parainfluenza Virus 4 NOT DETECTED NOT DETECTED Final   Respiratory Syncytial Virus NOT DETECTED NOT DETECTED Final   Bordetella pertussis NOT DETECTED NOT DETECTED Final   Bordetella Parapertussis NOT DETECTED NOT DETECTED Final   Chlamydophila pneumoniae NOT DETECTED NOT DETECTED Final   Mycoplasma pneumoniae NOT DETECTED NOT DETECTED Final    Comment: Performed at [facility], [place] [address], [place],  [address]     Labs: BNP (last 3 results) Recent Labs    11/24/20 0110  BNP 71.2   Basic Metabolic Panel: Recent Labs  Lab 11/23/20 2312 11/24/20 0352   NA 139 137  K 3.8 3.5  CL 105 104  CO2 21* 20*  GLUCOSE 93 77  BUN 17 14  CREATININE 0.88 0.85  CALCIUM 9.5 9.0  MG 1.9  --    Liver Function Tests: Recent Labs  Lab 11/23/20 2312 11/24/20 0352  AST 32 25  ALT 22 20  ALKPHOS 45 41  BILITOT 0.8 0.7  PROT 7.8 6.9  ALBUMIN 2.7* 2.4*   No results for input(s): LIPASE, AMYLASE in the last 168 hours. No results for input(s): AMMONIA in the last 168 hours. CBC: Recent Labs  Lab 11/23/20 2312 11/24/20 0352 11/25/20 1008  WBC 13.3* 12.7* 17.4*  NEUTROABS 8.9*  --  13.6*  HGB 11.9* 10.4* 10.7*  HCT 37.9 33.3* 34.2*  MCV 89.8 90.5 90.5  PLT 612* 533* 561*   Cardiac Enzymes: No results for input(s): CKTOTAL, CKMB, CKMBINDEX, TROPONINI in the last 168 hours. BNP: Invalid input(s): POCBNP CBG: Recent Labs  Lab 11/25/20 0908 11/25/20 1136 11/25/20 1606 11/26/20 0718 11/26/20 0754  GLUCAP 208* 180* 170* 59* 107*   D-Dimer No results for input(s): DDIMER in the last 72 hours. Hgb A1c Recent Labs    11/24/20 0352  HGBA1C 8.2*   Lipid Profile No results for input(s): CHOL, HDL, LDLCALC, TRIG, CHOLHDL, LDLDIRECT in the last 72 hours. Thyroid function studies Recent Labs    11/24/20 0352  TSH 1.217   Anemia work up No results for input(s): VITAMINB12, FOLATE, FERRITIN, TIBC, IRON, RETICCTPCT in the last 72 hours. Urinalysis    Component Value Date/Time   COLORURINE YELLOW 11/16/2015 2305   APPEARANCEUR CLOUDY (A) 11/16/2015 2305   LABSPEC 1.014 11/16/2015 2305   PHURINE 5.5 11/16/2015 2305   GLUCOSEU 100 (A) 11/16/2015 2305   HGBUR NEGATIVE 11/16/2015 2305   BILIRUBINUR NEGATIVE 11/16/2015 2305   KETONESUR NEGATIVE 11/16/2015 2305   PROTEINUR NEGATIVE 11/16/2015 2305   NITRITE NEGATIVE 11/16/2015 2305   LEUKOCYTESUR MODERATE (A) 11/16/2015 2305   Sepsis Labs Invalid input(s): PROCALCITONIN,  WBC,  LACTICIDVEN Microbiology Recent Results (from the past 240 hour(s))  Respiratory (~20 pathogens) panel  by PCR     Status: Abnormal   Collection Time: 11/24/20  8:52 AM   Specimen: Nasopharyngeal Swab; Respiratory  Result Value Ref Range Status   Adenovirus NOT DETECTED NOT DETECTED Final   Coronavirus 229E NOT DETECTED NOT DETECTED Final    Comment: (NOTE) The Coronavirus on the Respiratory Panel, DOES NOT test for the novel  Coronavirus (2019 nCoV)    Coronavirus HKU1 NOT DETECTED NOT DETECTED Final   Coronavirus NL63 NOT DETECTED NOT DETECTED Final   Coronavirus OC43 NOT DETECTED NOT DETECTED Final  Metapneumovirus DETECTED (A) NOT DETECTED Final   Rhinovirus / Enterovirus NOT DETECTED NOT DETECTED Final   Influenza A NOT DETECTED NOT DETECTED Final   Influenza B NOT DETECTED NOT DETECTED Final   Parainfluenza Virus 1 NOT DETECTED NOT DETECTED Final   Parainfluenza Virus 2 NOT DETECTED NOT DETECTED Final   Parainfluenza Virus 3 NOT DETECTED NOT DETECTED Final   Parainfluenza Virus 4 NOT DETECTED NOT DETECTED Final   Respiratory Syncytial Virus NOT DETECTED NOT DETECTED Final   Bordetella pertussis NOT DETECTED NOT DETECTED Final   Bordetella Parapertussis NOT DETECTED NOT DETECTED Final   Chlamydophila pneumoniae NOT DETECTED NOT DETECTED Final   Mycoplasma pneumoniae NOT DETECTED NOT DETECTED Final    Comment: Performed at [facility], [place] [address] [place], [place] [address]     Time coordinating discharge: Over 30 minutes  SIGNED:   Charlynne Cousins, MD  [facility] 11/26/2020, 9:08 AM Pager   If 7PM-7AM, please contact night-coverage [email or web address] Password [email or web address]

## 2020-11-27 NOTE — Telephone Encounter (Signed)
**Note De-Identified Threasa Kinch Obfuscation** Patient contacted regarding discharge from [facility] on 11/26/2020.  Patient understands to follow up with Dr Tenny Craw on 11/30/2020 at 2:20 at [address] in [place], [place] [address] Patient understands discharge instructions? Yes Patient understands medications and regiment? Yes Patient understands to bring all medications to this visit? Yes but states that she has too many pills to carry and that her med list is correct. I strongly encouraged her to bring them but again she replied "My medication list is correct".  Ask patient:  Are you enrolled in My Chart: Yes  The pt denies CP/discomfort, SOB, dizziness, lightheadedness, syncope, or elevated HR. She reports that she is feeling good at this time and is aware to call us at Dr Charlott Rakes office at [phone] if she has any questions or concerns.  She thanked me for this transition of care call.

## 2020-11-29 ENCOUNTER — Ambulatory Visit (INDEPENDENT_AMBULATORY_CARE_PROVIDER_SITE_OTHER): Payer: PPO | Admitting: Family Medicine

## 2020-11-29 ENCOUNTER — Other Ambulatory Visit: Payer: Self-pay

## 2020-11-29 ENCOUNTER — Encounter (INDEPENDENT_AMBULATORY_CARE_PROVIDER_SITE_OTHER): Payer: Self-pay | Admitting: Family Medicine

## 2020-11-29 VITALS — BP 144/72 | HR 83 | Temp 97.7°F | Ht 61.0 in | Wt 294.0 lb

## 2020-11-29 DIAGNOSIS — E538 Deficiency of other specified B group vitamins: Secondary | ICD-10-CM | POA: Diagnosis not present

## 2020-11-29 DIAGNOSIS — E559 Vitamin D deficiency, unspecified: Secondary | ICD-10-CM

## 2020-11-29 DIAGNOSIS — Z794 Long term (current) use of insulin: Secondary | ICD-10-CM

## 2020-11-29 DIAGNOSIS — E1140 Type 2 diabetes mellitus with diabetic neuropathy, unspecified: Secondary | ICD-10-CM

## 2020-11-29 DIAGNOSIS — Z6841 Body Mass Index (BMI) 40.0 and over, adult: Secondary | ICD-10-CM

## 2020-11-29 DIAGNOSIS — R5383 Other fatigue: Secondary | ICD-10-CM | POA: Insufficient documentation

## 2020-11-29 DIAGNOSIS — R0602 Shortness of breath: Secondary | ICD-10-CM

## 2020-11-29 DIAGNOSIS — Z1331 Encounter for screening for depression: Secondary | ICD-10-CM | POA: Diagnosis not present

## 2020-11-29 DIAGNOSIS — Z0289 Encounter for other administrative examinations: Secondary | ICD-10-CM

## 2020-11-30 ENCOUNTER — Ambulatory Visit: Payer: PPO | Admitting: Internal Medicine

## 2020-11-30 ENCOUNTER — Encounter: Payer: Self-pay | Admitting: Internal Medicine

## 2020-11-30 VITALS — BP 120/60 | HR 85 | Ht 61.0 in | Wt 298.0 lb

## 2020-11-30 DIAGNOSIS — I480 Paroxysmal atrial fibrillation: Secondary | ICD-10-CM | POA: Diagnosis not present

## 2020-11-30 LAB — VITAMIN D 25 HYDROXY (VIT D DEFICIENCY, FRACTURES): Vit D, 25-Hydroxy: 19.7 ng/mL — ABNORMAL LOW (ref 30.0–100.0)

## 2020-11-30 LAB — VITAMIN B12: Vitamin B-12: 605 pg/mL (ref 232–1245)

## 2020-11-30 NOTE — Progress Notes (Signed)
Cardiology Office Note   Date:  11/30/2020   ID:  YOSHIYE KRAFT, DOB [birth year], MRN [id number]  PCP:  Vernie Shanks, MD  Cardiologist:   Dorris Carnes, MD    F/U for atrial fbrillation   History of Present Illness: Melanie Reeves is a 70 y.o. female with a history of HTN, PAF, DMII, HL, OSA, LBBB, morbid obesity   The pt was last in cardiology clinic in September 2021     Since seen the pt was admitted to [facility] with COVID infection back in Jan 2021  She was not vaccinated   She returned to [facility] on 11/04/20 with CP and SOB   WBC elevated (possible steroid use)   Trop minimally elevated at 36  CT showed no PE but worsening pneumonia.  Second troponin less.   Started on ABX and sent out The pt represented to ED  Again on 2/12  Admitted   CXR with increased intersitt marking   COntinued on ABX and steroids.   Echo showed normal LVEF   Gr I diastolic dysfunction  SInce d/c the pt says she is breathing better   Almost baseline   She denies CP   No palpitations      Current Meds  Medication Sig  . atenolol (TENORMIN) 25 MG tablet Take 1 tablet (25 mg total) by mouth daily.  . benzonatate (TESSALON) 100 MG capsule Take 1 capsule (100 mg total) by mouth every 8 (eight) hours.  . Blood Glucose Monitoring Suppl (ONETOUCH VERIO) w/Device KIT UAD to monitor glucose tid; Dx: E11.65  . Continuous Blood Gluc Receiver (FREESTYLE LIBRE 2 READER) DEVI USE AS DIRECTED  . Continuous Blood Gluc Sensor (FREESTYLE LIBRE 2 SENSOR) MISC APPLY TO SKIN EVERY 14 (FOURTEEN) DAYS AS DIRECTED  . DULoxetine (CYMBALTA) 30 MG capsule TAKE 1 CAPSULE (30 MG TOTAL) BY MOUTH DAILY. TAKE IN ADDITION TO THE 60 MG  . DULoxetine (CYMBALTA) 60 MG capsule Take 60 mg by mouth daily. TAKE 1 TABLET BY MOUTH ONCE DAILY.  . fenofibrate (TRICOR) 145 MG tablet Take 145 mg by mouth daily.  Marland Kitchen gabapentin (NEURONTIN) 600 MG tablet TAKE 1 TABLET BY MOUTH 3 TIMES A DAY AND 1 TABLET AT BEDTIME  . HYDROcodone-acetaminophen  (NORCO/VICODIN) 5-325 MG tablet Take 1 tablet by mouth every 6 (six) hours as needed for moderate pain.  . Insulin Pen Needle (B-D ULTRAFINE III SHORT PEN) 31G X 8 MM MISC Use to inject medication 5 times daily.  . INVOKANA 300 MG TABS tablet TAKE 1 TABLET (300 MG TOTAL) BY MOUTH DAILY BEFORE BREAKFAST.  Marland Kitchen Ipratropium-Albuterol (COMBIVENT RESPIMAT) 20-100 MCG/ACT AERS respimat Inhale 1 puff into the lungs in the morning, at noon, and at bedtime.  Marland Kitchen loratadine (CLARITIN) 10 MG tablet Take 10 mg by mouth daily.  . metFORMIN (GLUCOPHAGE-XR) 500 MG 24 hr tablet TAKE 4 TABLETS (2,000 MG TOTAL) BY MOUTH DAILY WITH SUPPER.  Marland Kitchen NOVOLOG FLEXPEN 100 UNIT/ML FlexPen INJECT 20-35 UNITS UNDER THE SKIN THREE TIMES DAILY BEFORE MEALS. (Patient taking differently: Inject 30-40 Units into the skin 3 (three) times daily with meals. Inject 20-35 units under the skin three times daily before meals.)  . Omega-3 Fatty Acids (FISH OIL) 1000 MG CAPS Take 2 capsules by mouth daily.  Glory Rosebush ULTRA test strip USE AS INSTRUCTED TO CHECK BLOOD SUGAR THREE TIMES DAILY.  . pravastatin (PRAVACHOL) 40 MG tablet Take 40 mg by mouth daily.  Marland Kitchen tiZANidine (ZANAFLEX) 2 MG tablet Take 2 mg  by mouth every 6 (six) hours as needed for muscle spasms.  . TOUJEO MAX SOLOSTAR 300 UNIT/ML Solostar Pen INJECT 140 UNITS INTO THE SKIN DAILY. . (Patient taking differently: Inject 150 Units into the skin daily. Marland Kitchen)  . VICTOZA 18 MG/3ML SOPN INJECT 0.3 MLS (1.8 MG TOTAL) INTO THE SKIN DAILY. INJECT ONCE DAILY AT THE SAME TIME  . XARELTO 20 MG TABS tablet TAKE 1 TABLET (20 MG TOTAL) BY MOUTH DAILY WITH SUPPER.     Allergies:   Simvastatin and Sulfa antibiotics   Past Medical History:  Diagnosis Date  . Abnormal electrocardiogram 08/14/2014   LBBB  . Anemia   . Anxiety   . Arthritis   . Atrial fibrillation ([name])   . Bilateral swelling of feet   . Chest pain   . Constipation   . Depression   . Diabetes mellitus ([place])   . Difficulty  walking   . [place] DEGENERATION 12/20/2009   Qualifier: Diagnosis of  By: Aline Brochure MD, Dorothyann Peng    . Dyspnea 08/14/2014  . Edema   . Gallbladder problem   . GERD (gastroesophageal reflux disease)   . Hyperlipidemia   . Hypertension   . LOW BACK PAIN 12/20/2009   Qualifier: Diagnosis of  By: Aline Brochure MD, Dorothyann Peng    . Morbid obesity ([place])   . OSA (obstructive sleep apnea) 08/16/2014  . Osteoarthritis   . Other fatigue   . Palpitations   . Seasonal allergies   . Shortness of breath   . Shortness of breath on exertion   . Sinusitis   . SPINAL STENOSIS 12/20/2009   Qualifier: Diagnosis of  By: Aline Brochure MD, Dorothyann Peng    . Stage 3 chronic kidney disease [facility])     Past Surgical History:  Procedure Laterality Date  . CHOLECYSTECTOMY    . COLONOSCOPY WITH PROPOFOL N/A 11/29/2014   Procedure: COLONOSCOPY WITH PROPOFOL;  Surgeon: Arta Silence, MD;  Location: [facility] ENDOSCOPY;  Service: Endoscopy;  Laterality: N/A;  . SKIN CANCER EXCISION    . TONSILLECTOMY       Social History:  The patient  reports that she quit smoking about 27 years ago. Her smoking use included cigarettes. She has a 40.00 pack-year smoking history. She has quit using smokeless tobacco. She reports current alcohol use. She reports that she does not use drugs.   Family History:  The patient's family history includes Anxiety disorder in her father and mother; Bipolar disorder in her mother; Cancer in her father; Cancer - Lung in her father; Depression in her father and mother; Diabetes in her father; Eating disorder in her mother; Heart disease in her father; Hyperlipidemia in her father and mother; Hypertension in her father and mother; Kidney disease in her mother; Obesity in her mother; Stroke in her mother.    ROS:  Please see the history of present illness. All other systems are reviewed and  Negative to the above problem except as noted.    PHYSICAL EXAM: VS:  BP 120/60 (BP Location: Left Arm, Patient Position: Sitting,  Cuff Size: Normal)   Pulse 85   Ht $R'5\' 1"'Uz$  (1.549 m)   Wt 298 lb (135.2 kg)   SpO2 94%   BMI 56.31 kg/m   GEN:  Morbidly obese 70 yo , in no acute distress  HEENT: normal  Neck: no obvious JVD   Cardiac:RRR S1, S2   No murmurs  Triv LE  edema  Respiratory:  clear to auscultation bilaterally, GI: soft, nontender, nondistended, + BS  No hepatomegaly  MS: no deformity Moving all extremities   Skin: warm and dry, no rash Neuro:  Strength and sensation are intact    EKG:  EKG is not ordered today.   Lipid Panel    Component Value Date/Time   CHOL 141 09/17/2020 1108   TRIG 127 10/23/2020 0430   HDL 42.40 09/17/2020 1108   CHOLHDL 3 09/17/2020 1108   VLDL 33.0 09/17/2020 1108   LDLCALC 65 09/17/2020 1108   LDLDIRECT 72.0 02/28/2020 1124      Wt Readings from Last 3 Encounters:  11/30/20 298 lb (135.2 kg)  11/29/20 294 lb (133.4 kg)  11/25/20 297 lb 9.9 oz (135 kg)      ASSESSMENT AND PLAN: 1   Atrial fib PAF   Found on event monitor Had some when hosp with COVID  Pt's CHADS2VASc is 3 / 4     Continue on anticoagulation       2   Dyspnea  Breathing is back towards baseline    Follow  Watch Na   3  LBBB  No ischemia on myovue in 2018  4   DM   Last A1C 8.2   Discussed diet      5.   LIpdis  LDL 97  HDL 35  Trig 312  (may 2021)  WIll follow   r    Current medicines are reviewed at length with the patient today.  The patient does not have concerns regarding medicines.  Signed, Dorris Carnes, MD  11/30/2020 3:05 PM    [facility] [place], [place], [place]  [address] Phone: [phone]; Fax: [phone]

## 2020-11-30 NOTE — Patient Instructions (Signed)
Medication Instructions:  TAKE FUROSEMIDE 40 MG 1 TAB WEEKLY POTASSIUM 20 MEQ 1 TAB WEEKLY *If you need a refill on your cardiac medications before your next appointment, please call your pharmacy*   Lab Work: NONE If you have labs (blood work) drawn today and your tests are completely normal, you will receive your results only by: Marland Kitchen MyChart Message (if you have MyChart) OR . A paper copy in the mail If you have any lab test that is abnormal or we need to change your treatment, we will call you to review the results.   Testing/Procedures: NONE   Follow-Up: At [facility], you and your health needs are our priority.  As part of our continuing mission to provide you with exceptional heart care, we have created designated Provider Care Teams.  These Care Teams include your primary Cardiologist (physician) and Advanced Practice Providers (APPs -  Physician Assistants and Nurse Practitioners) who all work together to provide you with the care you need, when you need it.  We recommend signing up for the patient portal called "MyChart".  Sign up information is provided on this After Visit Summary.  MyChart is used to connect with patients for Virtual Visits (Telemedicine).  Patients are able to view lab/test results, encounter notes, upcoming appointments, etc.  Non-urgent messages can be sent to your provider as well.   To learn more about what you can do with MyChart, go to [email or web address].    Your next appointment:   4 month(s)  The format for your next appointment:   In Person  Provider:   You may see Dietrich Pates, MD or one of the following Advanced Practice Providers on your designated Care Team:    Tereso Newcomer, PA-C  Chelsea Aus, [place]    Other Instructions

## 2020-12-03 DIAGNOSIS — F321 Major depressive disorder, single episode, moderate: Secondary | ICD-10-CM | POA: Diagnosis not present

## 2020-12-03 DIAGNOSIS — G629 Polyneuropathy, unspecified: Secondary | ICD-10-CM | POA: Diagnosis not present

## 2020-12-03 DIAGNOSIS — Z8739 Personal history of other diseases of the musculoskeletal system and connective tissue: Secondary | ICD-10-CM | POA: Diagnosis not present

## 2020-12-03 DIAGNOSIS — I10 Essential (primary) hypertension: Secondary | ICD-10-CM | POA: Diagnosis not present

## 2020-12-03 DIAGNOSIS — M8580 Other specified disorders of bone density and structure, unspecified site: Secondary | ICD-10-CM | POA: Diagnosis not present

## 2020-12-03 DIAGNOSIS — E1136 Type 2 diabetes mellitus with diabetic cataract: Secondary | ICD-10-CM | POA: Diagnosis not present

## 2020-12-03 DIAGNOSIS — N1831 Chronic kidney disease, stage 3a: Secondary | ICD-10-CM | POA: Diagnosis not present

## 2020-12-03 DIAGNOSIS — N179 Acute kidney failure, unspecified: Secondary | ICD-10-CM | POA: Diagnosis not present

## 2020-12-03 DIAGNOSIS — E1122 Type 2 diabetes mellitus with diabetic chronic kidney disease: Secondary | ICD-10-CM | POA: Diagnosis not present

## 2020-12-03 DIAGNOSIS — E1165 Type 2 diabetes mellitus with hyperglycemia: Secondary | ICD-10-CM | POA: Diagnosis not present

## 2020-12-03 DIAGNOSIS — E785 Hyperlipidemia, unspecified: Secondary | ICD-10-CM | POA: Diagnosis not present

## 2020-12-03 DIAGNOSIS — E1142 Type 2 diabetes mellitus with diabetic polyneuropathy: Secondary | ICD-10-CM | POA: Diagnosis not present

## 2020-12-03 DIAGNOSIS — I4891 Unspecified atrial fibrillation: Secondary | ICD-10-CM | POA: Diagnosis not present

## 2020-12-04 DIAGNOSIS — Z794 Long term (current) use of insulin: Secondary | ICD-10-CM | POA: Diagnosis not present

## 2020-12-04 DIAGNOSIS — E785 Hyperlipidemia, unspecified: Secondary | ICD-10-CM | POA: Diagnosis not present

## 2020-12-04 DIAGNOSIS — M545 Low back pain, unspecified: Secondary | ICD-10-CM | POA: Diagnosis not present

## 2020-12-04 DIAGNOSIS — U071 COVID-19: Secondary | ICD-10-CM | POA: Diagnosis not present

## 2020-12-04 DIAGNOSIS — E1122 Type 2 diabetes mellitus with diabetic chronic kidney disease: Secondary | ICD-10-CM | POA: Diagnosis not present

## 2020-12-04 DIAGNOSIS — G4733 Obstructive sleep apnea (adult) (pediatric): Secondary | ICD-10-CM | POA: Diagnosis not present

## 2020-12-04 DIAGNOSIS — J123 Human metapneumovirus pneumonia: Secondary | ICD-10-CM | POA: Diagnosis not present

## 2020-12-04 DIAGNOSIS — I4891 Unspecified atrial fibrillation: Secondary | ICD-10-CM | POA: Diagnosis not present

## 2020-12-04 DIAGNOSIS — I10 Essential (primary) hypertension: Secondary | ICD-10-CM | POA: Diagnosis not present

## 2020-12-04 DIAGNOSIS — E119 Type 2 diabetes mellitus without complications: Secondary | ICD-10-CM | POA: Diagnosis not present

## 2020-12-04 DIAGNOSIS — J9601 Acute respiratory failure with hypoxia: Secondary | ICD-10-CM | POA: Diagnosis not present

## 2020-12-04 DIAGNOSIS — R0600 Dyspnea, unspecified: Secondary | ICD-10-CM | POA: Diagnosis not present

## 2020-12-05 DIAGNOSIS — G4733 Obstructive sleep apnea (adult) (pediatric): Secondary | ICD-10-CM | POA: Diagnosis not present

## 2020-12-05 NOTE — Progress Notes (Signed)
Chief Complaint:   OBESITY [name] (MR# [id number]) is a pleasant 70 y.o. female with multiple serious health issues, many of which are exacerbated by her weight. Current BMI is Body mass index is 55.55 kg/m. Melanie Reeves has been struggling with her weight for many years and has been unsuccessful in either losing weight, maintaining weight loss, or reaching her healthy weight goal. She is ready to work on healthy weight loss with [place].  Melanie Reeves is currently in the action stage of change and ready to dedicate time achieving and maintaining a healthier weight. [name] is interested in becoming our patient and working on intensive lifestyle modifications including (but not limited to) diet and exercise for weight loss.  Melanie Reeves's habits were reviewed today and are as follows: she thinks her family will eat healthier with her, she struggles with family and or coworkers weight loss sabotage, her desired weight loss is 124 lbs, she has been heavy most of her life, she started gaining weight gradually, her heaviest weight ever was 330 pounds, she has significant food cravings issues, she snacks frequently in the evenings, she wakes up frequently in the middle of the night to eat, she skips meals frequently, she is frequently drinking liquids with calories, she frequently makes poor food choices, she frequently eats larger portions than normal and she struggles with emotional eating.  Depression Screen [name]'s Food and Mood (modified PHQ-9) score was 21.  Depression screen [facility] 2/9 11/29/2020  Decreased Interest 3  Down, Depressed, Hopeless 3  PHQ - 2 Score 6  Altered sleeping 3  Tired, decreased energy 3  Change in appetite 2  Feeling bad or failure about yourself  3  Trouble concentrating 0  Moving slowly or fidgety/restless 3  Suicidal thoughts 1  PHQ-9 Score 21   Subjective:   1. Other fatigue Adysson admits to daytime somnolence and admits to waking up still tired. Patent has a history of  symptoms of daytime fatigue. [name] generally gets 4 hours of sleep per night, and states that she has nightime awakenings. Snoring is present. Apneic episodes are not present. Epworth Sleepiness Score is 20.  2. Shortness of breath on exertion [name] notes increasing shortness of breath with exercising and seems to be worsening over time with weight gain. She notes getting out of breath sooner with activity than she used to. This has not gotten worse recently. [name] denies shortness of breath at rest or orthopnea.  3. Type 2 diabetes mellitus with diabetic neuropathy, with long-term current use of insulin (HCC) [name]'s recent A1c was elevated at >8, and she is followed by Dr. Lucianne Muss. She is on Toujeo and Victoza. She doesn't have her BGs log today.  4. Vitamin D deficiency [name] is at very high risk of Vit D deficiency. She notes fatigue. She has no recent labs.  5. B12 deficiency [name] is at very high risk of B12 deficiency. She notes fatigue. She has no recent labs.  Assessment/Plan:   1. Other fatigue [name] does feel that her weight is causing her energy to be lower than it should be. Fatigue may be related to obesity, depression or many other causes. Labs will be ordered, and in the meanwhile, [name] will focus on self care including making healthy food choices, increasing physical activity and focusing on stress reduction.  2. Shortness of breath on exertion [name] does feel that she gets out of breath more easily that she used to when she exercises. [name]'s shortness of breath appears  to be obesity related and exercise induced. She has agreed to work on weight loss and gradually increase exercise to treat her exercise induced shortness of breath. Will continue to monitor closely.  3. Type 2 diabetes mellitus with diabetic neuropathy, with long-term current use of insulin (HCC) Good blood sugar control is important to decrease the likelihood of diabetic complications such as nephropathy,  neuropathy, limb loss, blindness, coronary artery disease, and death. Intensive lifestyle modification including diet, exercise and weight loss are the first line of treatment for diabetes. [name] is at risk of hypoglycemia with weight loss. We will continue to monitor closely, and she was advised to keep in touch with Dr. Lucianne Muss as instructed. Hypoglycemia handout was given today.  4. Vitamin D deficiency Low Vitamin D level contributes to fatigue and are associated with obesity, breast, and colon cancer. We will check labs today. [name] will follow-up for routine testing of Vitamin D, at least 2-3 times per year to avoid over-replacement.  - VITAMIN D 25 Hydroxy (Vit-D Deficiency, Fractures)  5. B12 deficiency The diagnosis was reviewed with the patient. We will check labs today. [name] will continue to follow up as directed. Orders and follow up as documented in patient record.  - Vitamin B12  6. Screening for depression [name] had a positive depression screening. Depression is commonly associated with obesity and often results in emotional eating behaviors. We will monitor this closely and work on CBT to help improve the non-hunger eating patterns. Referral to Psychology may be required if no improvement is seen as she continues in our clinic.  7. Class 3 severe obesity with serious comorbidity and body mass index (BMI) of 50.0 to 59.9 in adult, unspecified obesity type (HCC) [name] is currently in the action stage of change and her goal is to continue with weight loss efforts. I recommend Letticia begin the structured treatment plan as follows:  She has agreed to the Category 3 Plan.  Exercise goals: No exercise has been prescribed for now, while we concentrate on nutritional changes.   Behavioral modification strategies: increasing lean protein intake and no skipping meals.  She was informed of the importance of frequent follow-up visits to maximize her success with intensive lifestyle  modifications for her multiple health conditions. She was informed we would discuss her lab results at her next visit unless there is a critical issue that needs to be addressed sooner. Alla agreed to keep her next visit at the agreed upon time to discuss these results.  Objective:   Blood pressure (!) 144/72, pulse 83, temperature 97.7 F (36.5 C), height 5\' 1"  (1.549 m), weight 294 lb (133.4 kg), SpO2 96 %. Body mass index is 55.55 kg/m.  EKG: Normal sinus rhythm, rate 91 BPM.  Indirect Calorimeter completed today shows a VO2 of 368 and a REE of 2561.  Her calculated basal metabolic rate is thus her basal metabolic rate is worse than expected.  General: Cooperative, alert, well developed, in no acute distress. HEENT: Conjunctivae and lids unremarkable. Cardiovascular: Regular rhythm.  Lungs: Normal work of breathing. Neurologic: No focal deficits.   Lab Results  Component Value Date   CREATININE 0.85 11/24/2020   BUN 14 11/24/2020   NA 137 11/24/2020   K 3.5 11/24/2020   CL 104 11/24/2020   CO2 20 (L) 11/24/2020   Lab Results  Component Value Date   ALT 20 11/24/2020   AST 25 11/24/2020   ALKPHOS 41 11/24/2020   BILITOT 0.7 11/24/2020   Lab  Results  Component Value Date   HGBA1C 8.2 (H) 11/24/2020   HGBA1C 7.7 (H) 09/17/2020   HGBA1C 7.3 (H) 06/08/2020   HGBA1C 6.7 (A) 03/02/2020   HGBA1C 8.9 (H) 11/28/2019   No results found for: INSULIN Lab Results  Component Value Date   TSH 1.217 11/24/2020   Lab Results  Component Value Date   CHOL 141 09/17/2020   HDL 42.40 09/17/2020   LDLCALC 65 09/17/2020   LDLDIRECT 72.0 02/28/2020   TRIG 127 10/23/2020   CHOLHDL 3 09/17/2020   Lab Results  Component Value Date   WBC 15.7 (H) 11/26/2020   HGB 11.2 (L) 11/26/2020   HCT 35.4 (L) 11/26/2020   MCV 88.7 11/26/2020   PLT 575 (H) 11/26/2020   Lab Results  Component Value Date   FERRITIN 264 10/25/2020   Obesity Behavioral Intervention:   Approximately  15 minutes were spent on the discussion below.  ASK: We discussed the diagnosis of obesity with Joanie today and Chanee agreed to give [place] permission to discuss obesity behavioral modification therapy today.  ASSESS: Michela has the diagnosis of obesity and her BMI today is 55.58. Neeti is in the action stage of change.   ADVISE: Danniell was educated on the multiple health risks of obesity as well as the benefit of weight loss to improve her health. She was advised of the need for long term treatment and the importance of lifestyle modifications to improve her current health and to decrease her risk of future health problems.  AGREE: Multiple dietary modification options and treatment options were discussed and Doretta agreed to follow the recommendations documented in the above note.  ARRANGE: Skyann was educated on the importance of frequent visits to treat obesity as outlined per CMS and USPSTF guidelines and agreed to schedule her next follow up appointment today.  Attestation Statements:   Reviewed by clinician on day of visit: allergies, medications, problem list, medical history, surgical history, family history, social history, and previous encounter notes.    I, Burt Knack, am acting as transcriptionist for Quillian Quince, MD.  I have reviewed the above documentation for accuracy and completeness, and I agree with the above. - Quillian Quince, MD

## 2020-12-09 DIAGNOSIS — G4733 Obstructive sleep apnea (adult) (pediatric): Secondary | ICD-10-CM | POA: Diagnosis not present

## 2020-12-10 ENCOUNTER — Other Ambulatory Visit: Payer: Self-pay | Admitting: Endocrinology

## 2020-12-13 ENCOUNTER — Encounter (INDEPENDENT_AMBULATORY_CARE_PROVIDER_SITE_OTHER): Payer: Self-pay | Admitting: Family Medicine

## 2020-12-13 ENCOUNTER — Other Ambulatory Visit: Payer: Self-pay

## 2020-12-13 ENCOUNTER — Ambulatory Visit (INDEPENDENT_AMBULATORY_CARE_PROVIDER_SITE_OTHER): Payer: PPO | Admitting: Family Medicine

## 2020-12-13 VITALS — BP 132/73 | HR 93 | Temp 98.0°F | Ht 61.0 in | Wt 293.0 lb

## 2020-12-13 DIAGNOSIS — E1169 Type 2 diabetes mellitus with other specified complication: Secondary | ICD-10-CM | POA: Diagnosis not present

## 2020-12-13 DIAGNOSIS — E559 Vitamin D deficiency, unspecified: Secondary | ICD-10-CM | POA: Diagnosis not present

## 2020-12-13 DIAGNOSIS — Z6841 Body Mass Index (BMI) 40.0 and over, adult: Secondary | ICD-10-CM

## 2020-12-13 DIAGNOSIS — Z794 Long term (current) use of insulin: Secondary | ICD-10-CM

## 2020-12-13 MED ORDER — VITAMIN D (ERGOCALCIFEROL) 1.25 MG (50000 UNIT) PO CAPS: 50000.0000 [IU] | ORAL_CAPSULE | ORAL | 0 refills | Status: DC

## 2020-12-17 NOTE — Progress Notes (Signed)
Chief Complaint:   OBESITY Melanie Reeves is here to discuss her progress with her obesity treatment plan along with follow-up of her obesity related diagnoses. Melanie Reeves is on the Category 3 Plan and states she is following her eating plan approximately 80% of the time. Melanie Reeves states she is doing 0 minutes 0 times per week.  Today's visit was #: 2 Starting weight: 294 lbs Starting date: 11/29/2020 Today's weight: 293 lbs Today's date: 12/13/2020 Total lbs lost to date: 1 Total lbs lost since last in-office visit: 1  Interim History: Melanie Reeves has done well with weight loss on her plan. Her hunger is controlled and she is working on eating all of her food.  Subjective:   1. Type 2 diabetes mellitus with other specified complication, with long-term current use of insulin (HCC) Melanie Reeves notes her blood sugars have improved since starting her eating plan. She doesn't have her meter readings with her today. She notes decreased polyuria and improved energy. I discussed labs with the patient today.  2. Vitamin D deficiency Melanie Reeves's Vit D level is low, and she is not on Vit D. I discussed labs with the patient today.  Assessment/Plan:   1. Type 2 diabetes mellitus with other specified complication, with long-term current use of insulin (HCC) Good blood sugar control is important to decrease the likelihood of diabetic complications such as nephropathy, neuropathy, limb loss, blindness, coronary artery disease, and death. Intensive lifestyle modification including diet, exercise and weight loss are the first line of treatment for diabetes. Najai is to see her Endocrinologist to follow her blood sugar. Ways to avoid hypoglycemia was discussed in depth.  2. Vitamin D deficiency Low Vitamin D level contributes to fatigue and are associated with obesity, breast, and colon cancer. Melanie Reeves agreed to start prescription Vitamin D 50,000 IU every week with no refills. She will follow-up for routine testing of Vitamin D, at  least 2-3 times per year to avoid over-replacement.  - Vitamin D, Ergocalciferol, (DRISDOL) 1.25 MG (50000 UNIT) CAPS capsule; Take 1 capsule (50,000 Units total) by mouth every 7 (seven) days.  Dispense: 4 capsule; Refill: 0  3. Class 3 severe obesity with serious comorbidity and body mass index (BMI) of 50.0 to 59.9 in adult, unspecified obesity type (HCC) Melanie Reeves is currently in the action stage of change. As such, her goal is to continue with weight loss efforts. She has agreed to the Category 3 Plan.   Lean meat equivalents were discussed.  Behavioral modification strategies: increasing lean protein intake and meal planning and cooking strategies.  Melanie Reeves has agreed to follow-up with our clinic in 2 to 3 weeks. She was informed of the importance of frequent follow-up visits to maximize her success with intensive lifestyle modifications for her multiple health conditions.   Objective:   Blood pressure 132/73, pulse 93, temperature 98 F (36.7 C), height 5\' 1"  (1.549 m), weight 293 lb (132.9 kg), SpO2 95 %. Body mass index is 55.36 kg/m.  General: Cooperative, alert, well developed, in no acute distress. HEENT: Conjunctivae and lids unremarkable. Cardiovascular: Regular rhythm.  Lungs: Normal work of breathing. Neurologic: No focal deficits.   Lab Results  Component Value Date   CREATININE 0.85 11/24/2020   BUN 14 11/24/2020   NA 137 11/24/2020   K 3.5 11/24/2020   CL 104 11/24/2020   CO2 20 (L) 11/24/2020   Lab Results  Component Value Date   ALT 20 11/24/2020   AST 25 11/24/2020   ALKPHOS 41 11/24/2020  BILITOT 0.7 11/24/2020   Lab Results  Component Value Date   HGBA1C 8.2 (H) 11/24/2020   HGBA1C 7.7 (H) 09/17/2020   HGBA1C 7.3 (H) 06/08/2020   HGBA1C 6.7 (A) 03/02/2020   HGBA1C 8.9 (H) 11/28/2019   No results found for: INSULIN Lab Results  Component Value Date   TSH 1.217 11/24/2020   Lab Results  Component Value Date   CHOL 141 09/17/2020   HDL 42.40  09/17/2020   LDLCALC 65 09/17/2020   LDLDIRECT 72.0 02/28/2020   TRIG 127 10/23/2020   CHOLHDL 3 09/17/2020   Lab Results  Component Value Date   WBC 15.7 (H) 11/26/2020   HGB 11.2 (L) 11/26/2020   HCT 35.4 (L) 11/26/2020   MCV 88.7 11/26/2020   PLT 575 (H) 11/26/2020   Lab Results  Component Value Date   FERRITIN 264 10/25/2020   Attestation Statements:   Reviewed by clinician on day of visit: allergies, medications, problem list, medical history, surgical history, family history, social history, and previous encounter notes.  Time spent on visit including pre-visit chart review and post-visit care and charting was 42 minutes.    I, Burt Knack, am acting as transcriptionist for Quillian Quince, MD.  I have reviewed the above documentation for accuracy and completeness, and I agree with the above. -  Quillian Quince, MD

## 2020-12-18 ENCOUNTER — Other Ambulatory Visit: Payer: Self-pay

## 2020-12-18 ENCOUNTER — Other Ambulatory Visit (INDEPENDENT_AMBULATORY_CARE_PROVIDER_SITE_OTHER): Payer: PPO

## 2020-12-18 DIAGNOSIS — Z794 Long term (current) use of insulin: Secondary | ICD-10-CM

## 2020-12-18 DIAGNOSIS — E1165 Type 2 diabetes mellitus with hyperglycemia: Secondary | ICD-10-CM | POA: Diagnosis not present

## 2020-12-18 LAB — BASIC METABOLIC PANEL
BUN: 29 mg/dL — ABNORMAL HIGH (ref 6–23)
CO2: 23 mEq/L (ref 19–32)
Calcium: 9.7 mg/dL (ref 8.4–10.5)
Chloride: 106 mEq/L (ref 96–112)
Creatinine, Ser: 0.91 mg/dL (ref 0.40–1.20)
GFR: 65.24 mL/min (ref >60.00)
Glucose, Bld: 124 mg/dL — ABNORMAL HIGH (ref 70–99)
Potassium: 4.1 mEq/L (ref 3.5–5.1)
Sodium: 137 mEq/L (ref 135–145)

## 2020-12-18 LAB — HEMOGLOBIN A1C: Hgb A1c MFr Bld: 6.8 % — ABNORMAL HIGH (ref 4.6–6.5)

## 2020-12-21 ENCOUNTER — Ambulatory Visit: Payer: PPO | Admitting: Endocrinology

## 2020-12-21 ENCOUNTER — Encounter: Payer: Self-pay | Admitting: Endocrinology

## 2020-12-21 ENCOUNTER — Other Ambulatory Visit: Payer: Self-pay

## 2020-12-21 VITALS — BP 140/82 | HR 83 | Resp 20 | Ht 61.0 in | Wt 297.4 lb

## 2020-12-21 DIAGNOSIS — E1165 Type 2 diabetes mellitus with hyperglycemia: Secondary | ICD-10-CM

## 2020-12-21 DIAGNOSIS — Z794 Long term (current) use of insulin: Secondary | ICD-10-CM

## 2020-12-21 MED ORDER — DEXCOM G6 SENSOR MISC: 3 refills | Status: DC

## 2020-12-21 MED ORDER — DEXCOM G6 TRANSMITTER MISC: 1.0000 | Freq: Once | 1 refills | Status: AC

## 2020-12-21 NOTE — Patient Instructions (Signed)
Keep am sugar 90-130 with Toujeo adjustment  Novolog 30 for carb meals and 25 average at supper

## 2020-12-21 NOTE — Progress Notes (Signed)
Patient ID: Melanie Reeves, female   DOB: July 14, 1951, 70 y.o.   MRN: [id number]           Reason for Appointment: Follow-up  for Type 2 Diabetes  Referring physician: Yaakov Guthrie   History of Present Illness:          Date of diagnosis of type 2 diabetes mellitus:  2011       Background history:     She was tested for diabetes when she presented with numbness and tingling in her feet to the health Department. She does not know what her blood sugars were initially  She was however started on both metformin and glipizide at that time. She thinks that in early 2015 she was started on Lantus insulin also developed poor control  Her record indicates A1c levels ranging from 8.2-9.3 and higher at 11.4 in 9/16 She had been started on Victoza in 12/16  Recent history:   INSULIN regimen is: Toujeo 150 U in am, NovoLog 20- 40 AC  Non-insulin hypoglycemic drugs the patient is taking are: Invokana 300 mg,  Metformin ER 2000 mg daily, Victoza 1.21m daily  A1c is 6.8 compared to 7.7, previously progressively higher   Current blood sugar patterns and problems identified:  She has had much better blood sugar control over the last few weeks  This is likely to be from a much better diet with watching her portions, types of meals and snacks and working with the bariatric medical program  Currently however has not required lower insulin doses than before  Recently her freestyle lElenor Legatois reading falsely low and average on this is only 93 recently compared to 149 on her fingersticks  Fasting blood sugars are mostly stable and only mildly increased  She has not had any hypoglycemia  She is adjusting her mealtime insulin based on PREmeal blood sugar mostly but not usually based on what she is eating  Usually eating some form of carbohydrate at breakfast and lunch and sandwiches frequently  Her weight is about the same   Side effects from medications have been: none  Compliance with the  medical regimen: Inadequate  Freestyle libre version 2 download data and interpretation as follows   Blood sugars are mostly averaging around 180 throughout the day with the relatively higher readings late at night  Variability is minimal  Her HYPERGLYCEMIA is occurring periodically late at night after 9 PM occasionally but also later at night also  No hypoglycemia present  Overall postprandial hyperglycemia is not significant most recently since 12/4 data is incomplete  Accuracy of her sensor not evaluated since she had no readings on the date when she had her labs drawn and she has not checked fingersticks at the same time at home usually   PRE-MEAL Fasting Lunch Dinner Bedtime Overall  Glucose range:  111-215    108-178  95-249  Mean/median:  146  142  134  141  138/149   POST-MEAL PC Breakfast PC Lunch PC Dinner  Glucose range:     Mean/median:    156   PREVIOUSLY:  CGM use % of time  66  2-week average/SD  180  Time in range      59%  % Time Above 180  35  % Time above 250  6  % Time Below 70 0     PRE-MEAL Fasting Lunch Dinner Bedtime Overall  Glucose range:       Averages:  178  171  160  181  Previous readings:  PRE-MEAL Fasting Lunch Dinner Bedtime Overall  Glucose range: 120-175      Mean/median:     ?     Self-care: The diet that the patient has been following is: None, not restricting carbohydrates and fats;   eating more starchy foods and bread at times    Meal times: Breakfast:10-11 AM Lunch:4 PM Dinner: 7 pm    Typical meal intake: Breakfast is eggs/ low fat meat..  Lunch.  Evening meal is meat, vegetables,, sometimes [place] food               Dietician visit, most recent: Years ago                Weight history: Lowest 178 lb several years ago, more recently 300-330  Wt Readings from Last 3 Encounters:  12/21/20 297 lb 6.4 oz (134.9 kg)  12/13/20 293 lb (132.9 kg)  11/30/20 298 lb (135.2 kg)   Glycemic control:   Lab Results   Component Value Date   HGBA1C 6.8 (H) 12/18/2020   HGBA1C 8.2 (H) 11/24/2020   HGBA1C 7.7 (H) 09/17/2020   Lab Results  Component Value Date   MICROALBUR 2.4 (H) 06/14/2020   LDLCALC 65 09/17/2020   CREATININE 0.91 12/18/2020      Lab on 12/18/2020  Component Date Value Ref Range Status  . Sodium 12/18/2020 137  135 - 145 mEq/L Final  . Potassium 12/18/2020 4.1  3.5 - 5.1 mEq/L Final  . Chloride 12/18/2020 106  96 - 112 mEq/L Final  . CO2 12/18/2020 23  19 - 32 mEq/L Final  . Glucose, Bld 12/18/2020 124* 70 - 99 mg/dL Final  . BUN 94/70/1104 29* 6 - 23 mg/dL Final  . Creatinine, Ser 12/18/2020 0.91  0.40 - 1.20 mg/dL Final  . GFR 38/13/4975 65.24  >60.00 mL/min Final   Calculated using the CKD-EPI Creatinine Equation (2021)  . Calcium 12/18/2020 9.7  8.4 - 10.5 mg/dL Final  . Hgb N0Q MFr Bld 12/18/2020 6.8* 4.6 - 6.5 % Final   Glycemic Control Guidelines for People with Diabetes:Non Diabetic:  <6%Goal of Therapy: <7%Additional Action Suggested:  >8%       Allergies as of 12/21/2020      Reactions   Simvastatin    Other reaction(s): leg weakness Other reaction(s): leg weakness   Sulfa Antibiotics Other (See Comments)   Cant remember Other reaction(s): as a child      Medication List       Accurate as of December 21, 2020 11:59 PM. If you have any questions, ask your nurse or doctor.        STOP taking these medications   benzonatate 100 MG capsule Commonly known as: TESSALON Stopped by: Reather Littler, MD   HYDROcodone-acetaminophen 5-325 MG tablet Commonly known as: NORCO/VICODIN Stopped by: Reather Littler, MD     TAKE these medications   atenolol 25 MG tablet Commonly known as: TENORMIN Take 1 tablet (25 mg total) by mouth daily.   B-D ULTRAFINE III SHORT PEN 31G X 8 MM Misc Generic drug: Insulin Pen Needle Use to inject medication 5 times daily.   Combivent Respimat 20-100 MCG/ACT Aers respimat Generic drug: Ipratropium-Albuterol Inhale 1 puff into the  lungs in the morning, at noon, and at bedtime.   cyclobenzaprine 5 MG tablet Commonly known as: FLEXERIL Take 5 mg by mouth 3 (three) times daily as needed for muscle spasms.   Dexcom G6 Transmitter Misc 1 Device by Does not apply  route once for 1 dose. Started by: Elayne Snare, MD   DULoxetine 60 MG capsule Commonly known as: CYMBALTA Take 60 mg by mouth daily. TAKE 1 TABLET BY MOUTH ONCE DAILY.   DULoxetine 30 MG capsule Commonly known as: CYMBALTA TAKE 1 CAPSULE (30 MG TOTAL) BY MOUTH DAILY. TAKE IN ADDITION TO THE 60 MG   fenofibrate 145 MG tablet Commonly known as: TRICOR Take 145 mg by mouth daily.   Fish Oil 1000 MG Caps Take 2 capsules by mouth daily.   FreeStyle Libre 2 Reader Kerrin Mo USE AS DIRECTED   FreeStyle Libre 2 Sensor Misc APPLY TO SKIN EVERY 14 (FOURTEEN) DAYS AS DIRECTED What changed: Another medication with the same name was added. Make sure you understand how and when to take each. Changed by: Elayne Snare, MD   Dexcom G6 Sensor Misc Use to monitor blood sugar, change after 10 days What changed: You were already taking a medication with the same name, and this prescription was added. Make sure you understand how and when to take each. Changed by: Elayne Snare, MD   gabapentin 600 MG tablet Commonly known as: NEURONTIN Take 1,200 mg by mouth 2 (two) times daily. Take 2 tablets (1213m total) by mouth twice daily.   Invokana 300 MG Tabs tablet Generic drug: canagliflozin TAKE 1 TABLET (300 MG TOTAL) BY MOUTH DAILY BEFORE BREAKFAST.   loratadine 10 MG tablet Commonly known as: CLARITIN Take 10 mg by mouth daily.   metFORMIN 500 MG 24 hr tablet Commonly known as: GLUCOPHAGE-XR TAKE 4 TABLETS (2,000 MG TOTAL) BY MOUTH DAILY WITH SUPPER.   NovoLOG FlexPen 100 UNIT/ML FlexPen Generic drug: insulin aspart Inject 30-40 Units into the skin 3 (three) times daily with meals.   OneTouch Ultra test strip Generic drug: glucose blood USE AS INSTRUCTED TO CHECK  BLOOD SUGAR THREE TIMES DAILY.   OneTouch Verio w/Device Kit UAD to monitor glucose tid; Dx: E11.65   pravastatin 40 MG tablet Commonly known as: PRAVACHOL Take 40 mg by mouth daily.   Toujeo Max SoloStar 300 UNIT/ML Solostar Pen Generic drug: insulin glargine (2 Unit Dial) Inject 150 Units into the skin daily.   Victoza 18 MG/3ML Sopn Generic drug: liraglutide INJECT 0.3 MLS (1.8 MG TOTAL) INTO THE SKIN DAILY. INJECT ONCE DAILY AT THE SAME TIME   Vitamin D (Ergocalciferol) 1.25 MG (50000 UNIT) Caps capsule Commonly known as: DRISDOL Take 1 capsule (50,000 Units total) by mouth every 7 (seven) days.   Xarelto 20 MG Tabs tablet Generic drug: rivaroxaban TAKE 1 TABLET (20 MG TOTAL) BY MOUTH DAILY WITH SUPPER.       Allergies:  Allergies  Allergen Reactions  . Simvastatin     Other reaction(s): leg weakness Other reaction(s): leg weakness  . Sulfa Antibiotics Other (See Comments)    Cant remember Other reaction(s): as a child    Past Medical History:  Diagnosis Date  . Abnormal electrocardiogram 08/14/2014   LBBB  . Anemia   . Anxiety   . Arthritis   . Atrial fibrillation [place]   . Bilateral swelling of feet   . Chest pain   . Constipation   . Depression   . Diabetes mellitus ([place]   . Difficulty walking   . [place] 12/20/2009   Qualifier: Diagnosis of  By: HAline BrochureMD, SDorothyann Peng   . Dyspnea 08/14/2014  . Edema   . Gallbladder problem   . GERD (gastroesophageal reflux disease)   . Hyperlipidemia   . Hypertension   .  LOW BACK PAIN 12/20/2009   Qualifier: Diagnosis of  By: Aline Brochure MD, Dorothyann Peng    . Morbid obesity ([place])   . OSA (obstructive sleep apnea) 08/16/2014  . Osteoarthritis   . Other fatigue   . Palpitations   . Seasonal allergies   . Shortness of breath   . Shortness of breath on exertion   . Sinusitis   . SPINAL STENOSIS 12/20/2009   Qualifier: Diagnosis of  By: Aline Brochure MD, Dorothyann Peng    . Stage 3 chronic kidney disease [facility])     Past  Surgical History:  Procedure Laterality Date  . CHOLECYSTECTOMY    . COLONOSCOPY WITH PROPOFOL N/A 11/29/2014   Procedure: COLONOSCOPY WITH PROPOFOL;  Surgeon: Arta Silence, MD;  Location: [facility];  Service: Endoscopy;  Laterality: N/A;  . SKIN CANCER EXCISION    . TONSILLECTOMY      Family History  Problem Relation Age of Onset  . Heart disease Father        MI  . Cancer - Lung Father        Small cell Lung CA  . Diabetes Father   . Hypertension Father   . Hyperlipidemia Father   . Cancer Father   . Depression Father   . Anxiety disorder Father   . Hypertension Mother   . Hyperlipidemia Mother   . Stroke Mother   . Kidney disease Mother   . Depression Mother   . Anxiety disorder Mother   . Bipolar disorder Mother   . Eating disorder Mother   . Obesity Mother     Social History:  reports that she quit smoking about 27 years ago. Her smoking use included cigarettes. She has a 40.00 pack-year smoking history. She has quit using smokeless tobacco. She reports current alcohol use. She reports that she does not use drugs.    Review of Systems    Lipid history:    Triglycerides have been over 400 at baseline and she is taking fenofibrate in addition to her pravastatin  She is taking pravastatin 4 days a week; she had intolerance to simvastatin before LDL is below 100 Triglycerides are recently below 200    Lab Results  Component Value Date   CHOL 141 09/17/2020   CHOL 134 02/28/2020   CHOL 111 07/22/2019   Lab Results  Component Value Date   HDL 42.40 09/17/2020   HDL 32.40 (L) 02/28/2020   HDL 33.30 (L) 07/22/2019   Lab Results  Component Value Date   LDLCALC 65 09/17/2020   LDLCALC 66 03/08/2019   LDLCALC 104 (H) 09/17/2015   Lab Results  Component Value Date   TRIG 127 10/23/2020   TRIG 165.0 (H) 09/17/2020   TRIG 227.0 (H) 02/28/2020   Lab Results  Component Value Date   CHOLHDL 3 09/17/2020   CHOLHDL 4 02/28/2020   CHOLHDL 3 07/22/2019    Lab Results  Component Value Date   LDLDIRECT 72.0 02/28/2020   LDLDIRECT 62.0 07/22/2019   LDLDIRECT 83.0 01/11/2019            .  Most recent eye exam was In 7/21, report not available   Hypertension:  On treatment from PCP; taking atenolol 50 mg and Hytrin 2 mg Also on Invokana   BP Readings from Last 3 Encounters:  12/21/20 140/82  12/13/20 132/73  11/30/20 120/60   Electrolytes: She was previously on Lasix and potassium but she still takes potassium but not Lasix  Lab Results  Component Value Date  K 4.1 12/18/2020     NEUROPATHY: Has persistent numbness for in her feet the last several years along with some pain and weakness   She is taking Cymbalta a total of 90 mg a day and 600 mg of gabapentin  Last foot exam in 7/21 done by her PCP, previous findings:   Monofilament sensation is decreased distally on all the toes and distal plantar surfaces   Physical Examination:  BP 140/82 (BP Location: Left Arm, Patient Position: Sitting, Cuff Size: Large)   Pulse 83   Resp 20   Ht _0  (1.549 m)   Wt 297 lb 6.4 oz (134.9 kg)   SpO2 94%   BMI 56.19 kg/m       ASSESSMENT:  Diabetes type 2,  with morbid obesity and long-standing poor control  See history of present illness for detailed discussion of  current management, blood sugar patterns and problems identified  Her A1c is now finally below 7% at 6.8  She is overall insulin resistant and is taking well over 200 units of insulin a day especially high basal insulin  With significantly improving her diet as far as portions, high fat intake, eating out and generally restricting calories and carbohydrates blood sugars are significantly better and fairly even c both fasting and after meals and only rarely going over 180 Average blood sugar at any given time is 130-150 range on her fingersticks More recently has not been able to use the freestyle libre accurately Fingerstick monitoring has been much more  complete also    PLAN:   She will try to get the Dexcom sensor and discussed the differences between this and freestyle libre No change in insulin unless her blood sugars are out of range Discussed adjusting mealtime dose based on what she is eating and since she is usually eating less carbohydrates at dinnertime she may take as little as 25 minutes Continue other medications along with insulin as before She will still take 30 to 40 units of NovoLog when eating out and generally 30 units at breakfast and lunch     Patient Instructions  Keep am sugar 90-130 with Toujeo adjustment  Novolog 30 for carb meals and 25 average at supper         Elayne Snare 12/22/2020, 3:40 PM   Note: This office note was prepared with Dragon voice recognition system technology. Any transcriptional errors that result from this process are unintentional.

## 2020-12-26 ENCOUNTER — Encounter (HOSPITAL_BASED_OUTPATIENT_CLINIC_OR_DEPARTMENT_OTHER): Payer: Self-pay

## 2020-12-26 DIAGNOSIS — J9601 Acute respiratory failure with hypoxia: Secondary | ICD-10-CM

## 2020-12-26 DIAGNOSIS — G4733 Obstructive sleep apnea (adult) (pediatric): Secondary | ICD-10-CM

## 2020-12-31 DIAGNOSIS — N183 Chronic kidney disease, stage 3 unspecified: Secondary | ICD-10-CM | POA: Diagnosis not present

## 2020-12-31 DIAGNOSIS — M1990 Unspecified osteoarthritis, unspecified site: Secondary | ICD-10-CM | POA: Diagnosis not present

## 2020-12-31 DIAGNOSIS — I10 Essential (primary) hypertension: Secondary | ICD-10-CM | POA: Diagnosis not present

## 2020-12-31 DIAGNOSIS — M19031 Primary osteoarthritis, right wrist: Secondary | ICD-10-CM | POA: Diagnosis not present

## 2020-12-31 DIAGNOSIS — I4891 Unspecified atrial fibrillation: Secondary | ICD-10-CM | POA: Diagnosis not present

## 2020-12-31 DIAGNOSIS — N179 Acute kidney failure, unspecified: Secondary | ICD-10-CM | POA: Diagnosis not present

## 2020-12-31 DIAGNOSIS — F321 Major depressive disorder, single episode, moderate: Secondary | ICD-10-CM | POA: Diagnosis not present

## 2020-12-31 DIAGNOSIS — E119 Type 2 diabetes mellitus without complications: Secondary | ICD-10-CM | POA: Diagnosis not present

## 2020-12-31 DIAGNOSIS — M160 Bilateral primary osteoarthritis of hip: Secondary | ICD-10-CM | POA: Diagnosis not present

## 2020-12-31 DIAGNOSIS — E1142 Type 2 diabetes mellitus with diabetic polyneuropathy: Secondary | ICD-10-CM | POA: Diagnosis not present

## 2020-12-31 DIAGNOSIS — E785 Hyperlipidemia, unspecified: Secondary | ICD-10-CM | POA: Diagnosis not present

## 2020-12-31 DIAGNOSIS — E1165 Type 2 diabetes mellitus with hyperglycemia: Secondary | ICD-10-CM | POA: Diagnosis not present

## 2021-01-01 ENCOUNTER — Ambulatory Visit (INDEPENDENT_AMBULATORY_CARE_PROVIDER_SITE_OTHER): Payer: PPO | Admitting: Family Medicine

## 2021-01-01 ENCOUNTER — Encounter (INDEPENDENT_AMBULATORY_CARE_PROVIDER_SITE_OTHER): Payer: Self-pay | Admitting: Family Medicine

## 2021-01-01 ENCOUNTER — Other Ambulatory Visit: Payer: Self-pay

## 2021-01-01 VITALS — BP 120/75 | HR 84 | Temp 98.0°F | Ht 61.0 in | Wt 291.0 lb

## 2021-01-01 DIAGNOSIS — Z6841 Body Mass Index (BMI) 40.0 and over, adult: Secondary | ICD-10-CM

## 2021-01-01 DIAGNOSIS — K5909 Other constipation: Secondary | ICD-10-CM | POA: Diagnosis not present

## 2021-01-01 DIAGNOSIS — E559 Vitamin D deficiency, unspecified: Secondary | ICD-10-CM | POA: Diagnosis not present

## 2021-01-01 MED ORDER — VITAMIN D (ERGOCALCIFEROL) 1.25 MG (50000 UNIT) PO CAPS
50000.0000 [IU] | ORAL_CAPSULE | ORAL | 0 refills | Status: DC
Start: 2021-01-01 — End: 2021-01-30

## 2021-01-01 MED ORDER — POLYETHYLENE GLYCOL 3350 17 G PO PACK
17.0000 g | PACK | Freq: Every day | ORAL | 0 refills | Status: DC
Start: 2021-01-01 — End: 2021-03-15

## 2021-01-03 NOTE — Progress Notes (Signed)
Chief Complaint:   OBESITY Melanie Reeves is here to discuss her progress with her obesity treatment plan along with follow-up of her obesity related diagnoses. Melanie Reeves is on the Category 3 Plan and states she is following her eating plan approximately 75% of the time. Melanie Reeves states she is doing 0 minutes 0 times per week.  Today's visit was #: 3 Starting weight: 294 lbs Starting date: 11/29/2020 Today's weight: 291 lbs Today's date: 01/01/2021 Total lbs lost to date: 3 Total lbs lost since last in-office visit: 2  Interim History: Melanie Reeves continues to do well with weight loss. She is working on Contract[employer] when she eats out and she has decreased simple carbohydrates. She is trying to increase her vegetables.  Subjective:   1. Vitamin D deficiency Melanie Reeves notes decreased BM frequency and then has large, hard BM.  2. Other constipation Melanie Reeves is stable on Vit D. She shows no signs of over-replacement.  Assessment/Plan:   1. Vitamin D deficiency Low Vitamin D level contributes to fatigue and are associated with obesity, breast, and colon cancer. We will refill prescription Vitamin D for 1 month, and we will recheck labs in 2 months. Melanie Reeves will follow-up for routine testing of Vitamin D, at least 2-3 times per year to avoid over-replacement.  - Vitamin D, Ergocalciferol, (DRISDOL) 1.25 MG (50000 UNIT) CAPS capsule; Take 1 capsule (50,000 Units total) by mouth every 7 (seven) days.  Dispense: 4 capsule; Refill: 0  2. Other constipation Melanie Reeves was informed that a decrease in bowel movement frequency is normal while losing weight, but stools should not be hard or painful. Melanie Reeves agreed to start miralax 17 gm q daily with increased water. Orders and follow up as documented in patient record.   - polyethylene glycol (MIRALAX / GLYCOLAX) 17 g packet; Take 17 g by mouth daily.  Dispense: 30 each; Refill: 0  3. Obesity with current BMI 55.1 Melanie Reeves is currently in the action stage of change. As  such, her goal is to continue with weight loss efforts. She has agreed to the Category 3 Plan.   Behavioral modification strategies: increasing lean protein intake, decreasing liquid calories and meal planning and cooking strategies.  Melanie Reeves has agreed to follow-up with our clinic in 2 weeks. She was informed of the importance of frequent follow-up visits to maximize her success with intensive lifestyle modifications for her multiple health conditions.   Objective:   Blood pressure 120/75, pulse 84, temperature 98 F (36.7 C), height 5\' 1"  (1.549 m), weight 291 lb (132 kg), SpO2 94 %. Body mass index is 54.98 kg/m.  General: Cooperative, alert, well developed, in no acute distress. HEENT: Conjunctivae and lids unremarkable. Cardiovascular: Regular rhythm.  Lungs: Normal work of breathing. Neurologic: No focal deficits.   Lab Results  Component Value Date   CREATININE 0.91 12/18/2020   BUN 29 (H) 12/18/2020   NA 137 12/18/2020   K 4.1 12/18/2020   CL 106 12/18/2020   CO2 23 12/18/2020   Lab Results  Component Value Date   ALT 20 11/24/2020   AST 25 11/24/2020   ALKPHOS 41 11/24/2020   BILITOT 0.7 11/24/2020   Lab Results  Component Value Date   HGBA1C 6.8 (H) 12/18/2020   HGBA1C 8.2 (H) 11/24/2020   HGBA1C 7.7 (H) 09/17/2020   HGBA1C 7.3 (H) 06/08/2020   HGBA1C 6.7 (A) 03/02/2020   No results found for: INSULIN Lab Results  Component Value Date   TSH 1.217 11/24/2020   Lab  Results  Component Value Date   CHOL 141 09/17/2020   HDL 42.40 09/17/2020   LDLCALC 65 09/17/2020   LDLDIRECT 72.0 02/28/2020   TRIG 127 10/23/2020   CHOLHDL 3 09/17/2020   Lab Results  Component Value Date   WBC 15.7 (H) 11/26/2020   HGB 11.2 (L) 11/26/2020   HCT 35.4 (L) 11/26/2020   MCV 88.7 11/26/2020   PLT 575 (H) 11/26/2020   Lab Results  Component Value Date   FERRITIN 264 10/25/2020    Obesity Behavioral Intervention:   Approximately 15 minutes were spent on the  discussion below.  ASK: We discussed the diagnosis of obesity with Melanie Reeves today and Melanie Reeves agreed to give [place] permission to discuss obesity behavioral modification therapy today.  ASSESS: Melanie Reeves has the diagnosis of obesity and her BMI today is 55.01. Melanie Reeves is in the action stage of change.   ADVISE: Melanie Reeves was educated on the multiple health risks of obesity as well as the benefit of weight loss to improve her health. She was advised of the need for long term treatment and the importance of lifestyle modifications to improve her current health and to decrease her risk of future health problems.  AGREE: Multiple dietary modification options and treatment options were discussed and Melanie Reeves agreed to follow the recommendations documented in the above note.  ARRANGE: Melanie Reeves was educated on the importance of frequent visits to treat obesity as outlined per CMS and USPSTF guidelines and agreed to schedule her next follow up appointment today.  Attestation Statements:   Reviewed by clinician on day of visit: allergies, medications, problem list, medical history, surgical history, family history, social history, and previous encounter notes.   I, Burt Knack, am acting as transcriptionist for Quillian Quince, MD.  I have reviewed the above documentation for accuracy and completeness, and I agree with the above. -  Quillian Quince, MD

## 2021-01-11 ENCOUNTER — Other Ambulatory Visit: Payer: Self-pay | Admitting: Endocrinology

## 2021-01-11 NOTE — Telephone Encounter (Signed)
Last Ov 12/21/20 Last refill by historical provider. Please advise

## 2021-01-16 ENCOUNTER — Encounter (INDEPENDENT_AMBULATORY_CARE_PROVIDER_SITE_OTHER): Payer: Self-pay | Admitting: Adult Health

## 2021-01-16 ENCOUNTER — Ambulatory Visit (INDEPENDENT_AMBULATORY_CARE_PROVIDER_SITE_OTHER): Payer: PPO | Admitting: Adult Health

## 2021-01-16 ENCOUNTER — Other Ambulatory Visit: Payer: Self-pay

## 2021-01-16 VITALS — BP 123/73 | HR 84 | Temp 98.2°F | Ht 61.0 in | Wt 286.0 lb

## 2021-01-16 DIAGNOSIS — Z6841 Body Mass Index (BMI) 40.0 and over, adult: Secondary | ICD-10-CM

## 2021-01-16 DIAGNOSIS — Z794 Long term (current) use of insulin: Secondary | ICD-10-CM

## 2021-01-16 DIAGNOSIS — K5909 Other constipation: Secondary | ICD-10-CM

## 2021-01-16 DIAGNOSIS — E1169 Type 2 diabetes mellitus with other specified complication: Secondary | ICD-10-CM | POA: Diagnosis not present

## 2021-01-17 NOTE — Progress Notes (Signed)
Chief Complaint:   OBESITY Melanie Reeves is here to discuss her progress with her obesity treatment plan along with follow-up of her obesity related diagnoses. Melanie Reeves is on the Category 3 Plan and states she is following her eating plan approximately 90% of the time. Melanie Reeves states she is not currently exercising.  Today's visit was #: 4 Starting weight: 294 lbs Starting date: 11/29/2020 Today's weight: 286 lbs Today's date: 01/16/2021 Total lbs lost to date: 8 Total lbs lost since last in-office visit: 5  Interim History: Melanie Reeves states "I love protein". She has been trying to decrease red meat and increase leaner meat options. She has decreased eating out- only eating out twice in the last two weeks- both at [facility]..  Subjective:   1. Type 2 diabetes mellitus with other specified complication, with long-term current use of insulin (HCC) Melanie Reeves's ambulatory fasting BG 100-120 and denies episodes of hypoglycemia. She is on Metformin, Victoza, Invokana, and Toujeo.   Lab Results  Component Value Date   HGBA1C 6.8 (H) 12/18/2020   HGBA1C 8.2 (H) 11/24/2020   HGBA1C 7.7 (H) 09/17/2020   Lab Results  Component Value Date   MICROALBUR 2.4 (H) 06/14/2020   LDLCALC 65 09/17/2020   CREATININE 0.91 12/18/2020    2. Other constipation Evora has not started Miralax but did purchase the OTC laxative. She has continued to only have 1 BM/day but previously 2-3 BM/day.  Assessment/Plan:   1. Type 2 diabetes mellitus with other specified complication, with long-term current use of insulin (HCC) Good blood sugar control is important to decrease the likelihood of diabetic complications such as nephropathy, neuropathy, limb loss, blindness, coronary artery disease, and death. Intensive lifestyle modification including diet, exercise and weight loss are the first line of treatment for diabetes. Continue current anti-diabetic regimen and category 3 meal plan.  2. Other constipation [name] was  informed that a decrease in bowel movement frequency is normal while losing weight, but stools should not be hard or painful. Orders and follow up as documented in patient record.   Counseling Getting to Good Bowel Health: Your goal is to have one soft bowel movement each day. Drink at least 8 glasses of water each day. Eat plenty of fiber (goal is over 25 grams each day). It is best to get most of your fiber from dietary sources which includes leafy green vegetables, fresh fruit, and whole grains. You may need to add fiber with the help of OTC fiber supplements. These include Metamucil, Citrucel, and Flaxseed. If you are still having trouble, try adding Miralax or Magnesium Citrate. If all of these changes do not work, Dietitian. Recommend taking daily Miralax in the morning and increase water and fiber intake.  3. Obesity with current BMI 54 Melanie Reeves is currently in the action stage of change. As such, her goal is to continue with weight loss efforts. She has agreed to the Category 3 Plan.   Exercise goals: No exercise has been prescribed at this time.  Behavioral modification strategies: increasing lean protein intake, decreasing simple carbohydrates, decreasing eating out!, meal planning and cooking strategies and planning for success.  Melanie Reeves has agreed to follow-up with our clinic in 2 weeks. She was informed of the importance of frequent follow-up visits to maximize her success with intensive lifestyle modifications for her multiple health conditions.   Objective:   Blood pressure 123/73, pulse 84, temperature 98.2 F (36.8 C), height 5\' 1"  (1.549 m), weight 286 lb (129.7 kg), SpO2  94 %. Body mass index is 54.04 kg/m.  General: Cooperative, alert, well developed, in no acute distress. HEENT: Conjunctivae and lids unremarkable. Cardiovascular: Regular rhythm.  Lungs: Normal work of breathing. Neurologic: No focal deficits.   Lab Results  Component Value Date   CREATININE  0.91 12/18/2020   BUN 29 (H) 12/18/2020   NA 137 12/18/2020   K 4.1 12/18/2020   CL 106 12/18/2020   CO2 23 12/18/2020   Lab Results  Component Value Date   ALT 20 11/24/2020   AST 25 11/24/2020   ALKPHOS 41 11/24/2020   BILITOT 0.7 11/24/2020   Lab Results  Component Value Date   HGBA1C 6.8 (H) 12/18/2020   HGBA1C 8.2 (H) 11/24/2020   HGBA1C 7.7 (H) 09/17/2020   HGBA1C 7.3 (H) 06/08/2020   HGBA1C 6.7 (A) 03/02/2020   No results found for: INSULIN Lab Results  Component Value Date   TSH 1.217 11/24/2020   Lab Results  Component Value Date   CHOL 141 09/17/2020   HDL 42.40 09/17/2020   LDLCALC 65 09/17/2020   LDLDIRECT 72.0 02/28/2020   TRIG 127 10/23/2020   CHOLHDL 3 09/17/2020   Lab Results  Component Value Date   WBC 15.7 (H) 11/26/2020   HGB 11.2 (L) 11/26/2020   HCT 35.4 (L) 11/26/2020   MCV 88.7 11/26/2020   PLT 575 (H) 11/26/2020   Lab Results  Component Value Date   FERRITIN 264 10/25/2020    Attestation Statements:   Reviewed by clinician on day of visit: allergies, medications, problem list, medical history, surgical history, family history, social history, and previous encounter notes.  Time spent on visit including pre-visit chart review and post-visit care and charting was 32 minutes.   Edmund Hilda, am acting as Energy manager for Melanie Hamburger, NP.  I have reviewed the above documentation for accuracy and completeness, and I agree with the above. -  Olean Sangster d. Misao Fackrell, NP-C

## 2021-01-22 DIAGNOSIS — E1142 Type 2 diabetes mellitus with diabetic polyneuropathy: Secondary | ICD-10-CM | POA: Diagnosis not present

## 2021-01-22 DIAGNOSIS — I10 Essential (primary) hypertension: Secondary | ICD-10-CM | POA: Diagnosis not present

## 2021-01-22 DIAGNOSIS — E785 Hyperlipidemia, unspecified: Secondary | ICD-10-CM | POA: Diagnosis not present

## 2021-01-22 DIAGNOSIS — N1831 Chronic kidney disease, stage 3a: Secondary | ICD-10-CM | POA: Diagnosis not present

## 2021-01-22 DIAGNOSIS — R899 Unspecified abnormal finding in specimens from other organs, systems and tissues: Secondary | ICD-10-CM | POA: Diagnosis not present

## 2021-01-22 DIAGNOSIS — M160 Bilateral primary osteoarthritis of hip: Secondary | ICD-10-CM | POA: Diagnosis not present

## 2021-01-22 DIAGNOSIS — D649 Anemia, unspecified: Secondary | ICD-10-CM | POA: Diagnosis not present

## 2021-01-22 DIAGNOSIS — M8580 Other specified disorders of bone density and structure, unspecified site: Secondary | ICD-10-CM | POA: Diagnosis not present

## 2021-01-22 DIAGNOSIS — M19031 Primary osteoarthritis, right wrist: Secondary | ICD-10-CM | POA: Diagnosis not present

## 2021-01-22 DIAGNOSIS — F321 Major depressive disorder, single episode, moderate: Secondary | ICD-10-CM | POA: Diagnosis not present

## 2021-01-22 DIAGNOSIS — E1136 Type 2 diabetes mellitus with diabetic cataract: Secondary | ICD-10-CM | POA: Diagnosis not present

## 2021-01-22 DIAGNOSIS — I4891 Unspecified atrial fibrillation: Secondary | ICD-10-CM | POA: Diagnosis not present

## 2021-01-22 DIAGNOSIS — E1122 Type 2 diabetes mellitus with diabetic chronic kidney disease: Secondary | ICD-10-CM | POA: Diagnosis not present

## 2021-01-24 ENCOUNTER — Other Ambulatory Visit: Payer: Self-pay | Admitting: Endocrinology

## 2021-01-30 ENCOUNTER — Ambulatory Visit (INDEPENDENT_AMBULATORY_CARE_PROVIDER_SITE_OTHER): Payer: PPO | Admitting: Family Medicine

## 2021-01-30 ENCOUNTER — Encounter (INDEPENDENT_AMBULATORY_CARE_PROVIDER_SITE_OTHER): Payer: Self-pay | Admitting: Family Medicine

## 2021-01-30 ENCOUNTER — Other Ambulatory Visit: Payer: Self-pay

## 2021-01-30 VITALS — BP 117/78 | HR 80 | Temp 97.9°F | Ht 61.0 in | Wt 288.0 lb

## 2021-01-30 DIAGNOSIS — Z6841 Body Mass Index (BMI) 40.0 and over, adult: Secondary | ICD-10-CM | POA: Diagnosis not present

## 2021-01-30 DIAGNOSIS — E559 Vitamin D deficiency, unspecified: Secondary | ICD-10-CM

## 2021-01-30 MED ORDER — VITAMIN D (ERGOCALCIFEROL) 1.25 MG (50000 UNIT) PO CAPS: 50000.0000 [IU] | ORAL_CAPSULE | ORAL | 0 refills | Status: DC

## 2021-02-01 ENCOUNTER — Other Ambulatory Visit: Payer: Self-pay | Admitting: Endocrinology

## 2021-02-05 NOTE — Progress Notes (Signed)
Chief Complaint:   OBESITY Melanie Reeves is here to discuss her progress with her obesity treatment plan along with follow-up of her obesity related diagnoses. Melanie Reeves is on the Category 3 Plan and states she is following her eating plan approximately 80-90% of the time. Melanie Reeves states she is doing 0 minutes 0 times per week.  Today's visit was #: 5 Starting weight: 294 lbs Starting date: 11/29/2020 Today's weight: 288 lbs Today's date: 01/30/2021 Total lbs lost to date: 6 Total lbs lost since last in-office visit: 0  Interim History: Melanie Reeves continues to work on diet and weight loss. She is deviating from her plan more, but she is mindful and trying to make smarter choices. Her protein is starting to decrease which is likely decreasing her RMR.  Subjective:   1. Vitamin D deficiency Melanie Reeves is on Vit D, but her level is not yet at goal. She requests a refill.  Assessment/Plan:   1. Vitamin D deficiency Low Vitamin D level contributes to fatigue and are associated with obesity, breast, and colon cancer. We will refill prescription Vitamin D 50,000 IU every week for 90 days with no refills. Melanie Reeves will follow-up for routine testing of Vitamin D, at least 2-3 times per year to avoid over-replacement.  - Vitamin D, Ergocalciferol, (DRISDOL) 1.25 MG (50000 UNIT) CAPS capsule; Take 1 capsule (50,000 Units total) by mouth every 7 (seven) days.  Dispense: 12 capsule; Refill: 0  2. Obesity with current BMI of 54.5 Melanie Reeves is currently in the action stage of change. As such, her goal is to continue with weight loss efforts. She has agreed to change to keeping a food journal and adhering to recommended goals of 1300-1600 calories and 85+ grams of protein daily.   Behavioral modification strategies: meal planning and cooking strategies and keeping a strict food journal.  Melanie Reeves has agreed to follow-up with our clinic in 3 to 4 weeks. She was informed of the importance of frequent follow-up visits to maximize  her success with intensive lifestyle modifications for her multiple health conditions.   Objective:   Blood pressure 117/78, pulse 80, temperature 97.9 F (36.6 C), height 5\' 1"  (1.549 m), weight 288 lb (130.6 kg), SpO2 94 %. Body mass index is 54.42 kg/m.  General: Cooperative, alert, well developed, in no acute distress. HEENT: Conjunctivae and lids unremarkable. Cardiovascular: Regular rhythm.  Lungs: Normal work of breathing. Neurologic: No focal deficits.   Lab Results  Component Value Date   CREATININE 0.91 12/18/2020   BUN 29 (H) 12/18/2020   NA 137 12/18/2020   K 4.1 12/18/2020   CL 106 12/18/2020   CO2 23 12/18/2020   Lab Results  Component Value Date   ALT 20 11/24/2020   AST 25 11/24/2020   ALKPHOS 41 11/24/2020   BILITOT 0.7 11/24/2020   Lab Results  Component Value Date   HGBA1C 6.8 (H) 12/18/2020   HGBA1C 8.2 (H) 11/24/2020   HGBA1C 7.7 (H) 09/17/2020   HGBA1C 7.3 (H) 06/08/2020   HGBA1C 6.7 (A) 03/02/2020   No results found for: INSULIN Lab Results  Component Value Date   TSH 1.217 11/24/2020   Lab Results  Component Value Date   CHOL 141 09/17/2020   HDL 42.40 09/17/2020   LDLCALC 65 09/17/2020   LDLDIRECT 72.0 02/28/2020   TRIG 127 10/23/2020   CHOLHDL 3 09/17/2020   Lab Results  Component Value Date   WBC 15.7 (H) 11/26/2020   HGB 11.2 (L) 11/26/2020   HCT 35.4 (  L) 11/26/2020   MCV 88.7 11/26/2020   PLT 575 (H) 11/26/2020   Lab Results  Component Value Date   FERRITIN 264 10/25/2020   Attestation Statements:   Reviewed by clinician on day of visit: allergies, medications, problem list, medical history, surgical history, family history, social history, and previous encounter notes.  Time spent on visit including pre-visit chart review and post-visit care and charting was 32 minutes.    I, Burt Knack, am acting as transcriptionist for Quillian Quince, MD.  I have reviewed the above documentation for accuracy and completeness,  and I agree with the above. -  Quillian Quince, MD

## 2021-02-14 ENCOUNTER — Encounter (HOSPITAL_BASED_OUTPATIENT_CLINIC_OR_DEPARTMENT_OTHER): Payer: PPO | Admitting: Internal Medicine

## 2021-02-27 ENCOUNTER — Other Ambulatory Visit: Payer: Self-pay

## 2021-02-27 ENCOUNTER — Ambulatory Visit (INDEPENDENT_AMBULATORY_CARE_PROVIDER_SITE_OTHER): Payer: PPO | Admitting: Family Medicine

## 2021-02-27 ENCOUNTER — Ambulatory Visit (HOSPITAL_BASED_OUTPATIENT_CLINIC_OR_DEPARTMENT_OTHER): Payer: PPO | Attending: Internal Medicine | Admitting: Internal Medicine

## 2021-02-27 ENCOUNTER — Encounter (INDEPENDENT_AMBULATORY_CARE_PROVIDER_SITE_OTHER): Payer: Self-pay | Admitting: Family Medicine

## 2021-02-27 ENCOUNTER — Other Ambulatory Visit: Payer: Self-pay | Admitting: Endocrinology

## 2021-02-27 VITALS — Ht 62.0 in | Wt 286.0 lb

## 2021-02-27 VITALS — BP 121/74 | HR 82 | Temp 98.1°F | Ht 61.0 in | Wt 289.0 lb

## 2021-02-27 DIAGNOSIS — G4733 Obstructive sleep apnea (adult) (pediatric): Secondary | ICD-10-CM

## 2021-02-27 DIAGNOSIS — Z794 Long term (current) use of insulin: Secondary | ICD-10-CM | POA: Diagnosis not present

## 2021-02-27 DIAGNOSIS — Z6841 Body Mass Index (BMI) 40.0 and over, adult: Secondary | ICD-10-CM | POA: Diagnosis not present

## 2021-02-27 DIAGNOSIS — E1169 Type 2 diabetes mellitus with other specified complication: Secondary | ICD-10-CM

## 2021-02-28 ENCOUNTER — Other Ambulatory Visit (HOSPITAL_BASED_OUTPATIENT_CLINIC_OR_DEPARTMENT_OTHER): Payer: Self-pay

## 2021-02-28 DIAGNOSIS — J9601 Acute respiratory failure with hypoxia: Secondary | ICD-10-CM

## 2021-02-28 DIAGNOSIS — G4733 Obstructive sleep apnea (adult) (pediatric): Secondary | ICD-10-CM

## 2021-02-28 NOTE — Progress Notes (Signed)
Chief Complaint:   OBESITY Melanie Reeves is here to discuss her progress with her obesity treatment plan along with follow-up of her obesity related diagnoses. Melanie Reeves is on keeping a food journal and adhering to recommended goals of 1300-1600 calories and 85+ grams of protein daily and states she is following her eating plan approximately 70% of the time. Melanie Reeves states she is doing 0 minutes 0 times per week.  Today's visit was #: 6 Starting weight: 294 lbs Starting date: 11/29/2020 Today's weight: 289 lbs Today's date: 02/27/2021 Total lbs lost to date: 5 Total lbs lost since last in-office visit: 0  Interim History: Melanie Reeves is working on [employer] and trying to portion control. She is up a little bit in water weight today. She is trying to journal but she struggles to meet her protein goals.  Subjective:   1. Type 2 diabetes mellitus with other specified complication, with long-term current use of insulin (HCC) Melanie Reeves continues to work on decreasing simple carbohydrates and fried foods, and increasing lean protein. Her fasting BGs this morning was 110 (no BGs log today). She denies feeling hypoglycemia. She self adjusts her insulin frequently.  2. OSA (obstructive sleep apnea) Melanie Reeves is scheduled for tonight. She is working on diet and weight loss to help improve her obstructive sleep apnea.  Assessment/Plan:   1. Type 2 diabetes mellitus with other specified complication, with long-term current use of insulin (HCC) Melanie Reeves is to continue with diet and exercise, and watch for hypoglycemia. She was reminded to use glucose tablets if her BGs drop. Good blood sugar control is important to decrease the likelihood of diabetic complications such as nephropathy, neuropathy, limb loss, blindness, coronary artery disease, and death. Intensive lifestyle modification including diet, exercise and weight loss are the first line of treatment for diabetes.   2. OSA (obstructive sleep  apnea) Intensive lifestyle modifications are the first line treatment for this issue. We discussed several lifestyle modifications today. Melanie Reeves will continue journaling and decreasing simple carbohydrates. We will follow closely. Orders and follow up as documented in patient record.   3. Obesity with current BMI 54.6 Melanie Reeves is currently in the action stage of change. As such, her goal is to continue with weight loss efforts. She has agreed to keeping a food journal and adhering to recommended goals of 1300-1600 calories and 85+ grams of protein daily.   I reset Melanie Reeves's journaling goals in her phone app and we discussed the importance of close journaling.  Behavioral modification strategies: keeping healthy foods in the home, dealing with family or coworker sabotage and keeping a strict food journal.  Melanie Reeves has agreed to follow-up with our clinic in 4 weeks. She was informed of the importance of frequent follow-up visits to maximize her success with intensive lifestyle modifications for her multiple health conditions.   Objective:   Blood pressure 121/74, pulse 82, temperature 98.1 F (36.7 C), height 5\' 1"  (1.549 m), weight 289 lb (131.1 kg), SpO2 95 %. Body mass index is 54.61 kg/m.  General: Cooperative, alert, well developed, in no acute distress. HEENT: Conjunctivae and lids unremarkable. Cardiovascular: Regular rhythm.  Lungs: Normal work of breathing. Neurologic: No focal deficits.   Lab Results  Component Value Date   CREATININE 0.91 12/18/2020   BUN 29 (H) 12/18/2020   NA 137 12/18/2020   K 4.1 12/18/2020   CL 106 12/18/2020   CO2 23 12/18/2020   Lab Results  Component Value Date   ALT 20 11/24/2020  AST 25 11/24/2020   ALKPHOS 41 11/24/2020   BILITOT 0.7 11/24/2020   Lab Results  Component Value Date   HGBA1C 6.8 (H) 12/18/2020   HGBA1C 8.2 (H) 11/24/2020   HGBA1C 7.7 (H) 09/17/2020   HGBA1C 7.3 (H) 06/08/2020   HGBA1C 6.7 (A) 03/02/2020   No results found  for: INSULIN Lab Results  Component Value Date   TSH 1.217 11/24/2020   Lab Results  Component Value Date   CHOL 141 09/17/2020   HDL 42.40 09/17/2020   LDLCALC 65 09/17/2020   LDLDIRECT 72.0 02/28/2020   TRIG 127 10/23/2020   CHOLHDL 3 09/17/2020   Lab Results  Component Value Date   WBC 15.7 (H) 11/26/2020   HGB 11.2 (L) 11/26/2020   HCT 35.4 (L) 11/26/2020   MCV 88.7 11/26/2020   PLT 575 (H) 11/26/2020   Lab Results  Component Value Date   FERRITIN 264 10/25/2020   Attestation Statements:   Reviewed by clinician on day of visit: allergies, medications, problem list, medical history, surgical history, family history, social history, and previous encounter notes.  Time spent on visit including pre-visit chart review and post-visit care and charting was 32 minutes.    I, Burt Knack, am acting as transcriptionist for Quillian Quince, MD.  I have reviewed the above documentation for accuracy and completeness, and I agree with the above. -  Quillian Quince, MD

## 2021-03-02 DIAGNOSIS — M67912 Unspecified disorder of synovium and tendon, left shoulder: Secondary | ICD-10-CM | POA: Diagnosis not present

## 2021-03-03 DIAGNOSIS — G4733 Obstructive sleep apnea (adult) (pediatric): Secondary | ICD-10-CM | POA: Diagnosis not present

## 2021-03-06 DIAGNOSIS — F321 Major depressive disorder, single episode, moderate: Secondary | ICD-10-CM | POA: Diagnosis not present

## 2021-03-06 DIAGNOSIS — N183 Chronic kidney disease, stage 3 unspecified: Secondary | ICD-10-CM | POA: Diagnosis not present

## 2021-03-06 DIAGNOSIS — E1165 Type 2 diabetes mellitus with hyperglycemia: Secondary | ICD-10-CM | POA: Diagnosis not present

## 2021-03-06 DIAGNOSIS — I4891 Unspecified atrial fibrillation: Secondary | ICD-10-CM | POA: Diagnosis not present

## 2021-03-06 DIAGNOSIS — I10 Essential (primary) hypertension: Secondary | ICD-10-CM | POA: Diagnosis not present

## 2021-03-06 DIAGNOSIS — E785 Hyperlipidemia, unspecified: Secondary | ICD-10-CM | POA: Diagnosis not present

## 2021-03-06 DIAGNOSIS — M1990 Unspecified osteoarthritis, unspecified site: Secondary | ICD-10-CM | POA: Diagnosis not present

## 2021-03-06 DIAGNOSIS — N179 Acute kidney failure, unspecified: Secondary | ICD-10-CM | POA: Diagnosis not present

## 2021-03-06 DIAGNOSIS — M160 Bilateral primary osteoarthritis of hip: Secondary | ICD-10-CM | POA: Diagnosis not present

## 2021-03-06 DIAGNOSIS — E1142 Type 2 diabetes mellitus with diabetic polyneuropathy: Secondary | ICD-10-CM | POA: Diagnosis not present

## 2021-03-06 DIAGNOSIS — E1122 Type 2 diabetes mellitus with diabetic chronic kidney disease: Secondary | ICD-10-CM | POA: Diagnosis not present

## 2021-03-06 DIAGNOSIS — E119 Type 2 diabetes mellitus without complications: Secondary | ICD-10-CM | POA: Diagnosis not present

## 2021-03-14 ENCOUNTER — Other Ambulatory Visit: Payer: Self-pay | Admitting: Endocrinology

## 2021-03-15 ENCOUNTER — Encounter: Payer: Self-pay | Admitting: Internal Medicine

## 2021-03-15 ENCOUNTER — Other Ambulatory Visit: Payer: Self-pay

## 2021-03-15 ENCOUNTER — Ambulatory Visit: Payer: PPO | Admitting: Internal Medicine

## 2021-03-15 VITALS — BP 132/74 | HR 82 | Ht 62.0 in | Wt 290.0 lb

## 2021-03-15 DIAGNOSIS — I480 Paroxysmal atrial fibrillation: Secondary | ICD-10-CM | POA: Diagnosis not present

## 2021-03-15 NOTE — Patient Instructions (Addendum)
Medication Instructions:  Your physician recommends that you continue on your current medications as directed. Please refer to the Current Medication list given to you today.  *If you need a refill on your cardiac medications before your next appointment, please call your pharmacy*   Lab Work: NONE If you have labs (blood work) drawn today and your tests are completely normal, you will receive your results only by: Marland Kitchen MyChart Message (if you have MyChart) OR . A paper copy in the mail If you have any lab test that is abnormal or we need to change your treatment, we will call you to review the results.   Testing/Procedures: NONE   Follow-Up: At [facility], you and your health needs are our priority.  As part of our continuing mission to provide you with exceptional heart care, we have created designated Provider Care Teams.  These Care Teams include your primary Cardiologist (physician) and Advanced Practice Providers (APPs -  Physician Assistants and Nurse Practitioners) who all work together to provide you with the care you need, when you need it.  We recommend signing up for the patient portal called "MyChart".  Sign up information is provided on this After Visit Summary.  MyChart is used to connect with patients for Virtual Visits (Telemedicine).  Patients are able to view lab/test results, encounter notes, upcoming appointments, etc.  Non-urgent messages can be sent to your provider as well.   To learn more about what you can do with MyChart, go to [email or web address].    Your next appointment:   Your physician recommends that you  follow-up appointment on August 23, 2021 at 4 pm

## 2021-03-15 NOTE — Progress Notes (Signed)
Cardiology Office Note   Date:  03/15/2021   ID:  Melanie Reeves, DOB 11/18/1950, MRN [id number]  PCP:  Vernie Shanks, MD  Cardiologist:   Dorris Carnes, MD    F/U for atrial fbrillation   History of Present Illness: Melanie Reeves is a 70 y.o. female with a history of HTN, diastolic CHF,  PAF, DMII, HL, OSA, LBBB, morbid obesity   Myovue in 2018 was normal  The pt was admitted earlier this year with pneumonia   Echo done showing normal LVEF and mild diastolic dysfunction  Since I saw her she has been on lasix 40 once weekly     The pt says she has been feeling good   She denies CP   NO SOB   No dizziness    No palpitation  Current Meds  Medication Sig  . atenolol (TENORMIN) 25 MG tablet Take 1 tablet (25 mg total) by mouth daily.  . Blood Glucose Monitoring Suppl (ONETOUCH VERIO) w/Device KIT UAD to monitor glucose tid; Dx: E11.65  . cyclobenzaprine (FLEXERIL) 5 MG tablet Take 5 mg by mouth 3 (three) times daily as needed for muscle spasms.  . DULoxetine (CYMBALTA) 30 MG capsule TAKE 1 CAPSULE BY MOUTH DAILY. TAKE IN ADDITION TO THE 60 MG  . DULoxetine (CYMBALTA) 60 MG capsule Take 60 mg by mouth daily. TAKE 1 TABLET BY MOUTH ONCE DAILY.  . fenofibrate (TRICOR) 145 MG tablet Take 145 mg by mouth daily.  Marland Kitchen gabapentin (NEURONTIN) 600 MG tablet Take 1,200 mg by mouth 2 (two) times daily. Take 2 tablets (1267m total) by mouth twice daily.  . insulin aspart (NOVOLOG FLEXPEN) 100 UNIT/ML FlexPen Inject 30-40 Units into the skin 3 (three) times daily with meals.  . insulin glargine, 2 Unit Dial, (TOUJEO MAX SOLOSTAR) 300 UNIT/ML Solostar Pen Inject 15 Units into the skin daily.  . Insulin Pen Needle (B-D ULTRAFINE III SHORT PEN) 31G X 8 MM MISC Use to inject medication 5 times daily.  . INVOKANA 300 MG TABS tablet TAKE 1 TABLET (300 MG TOTAL) BY MOUTH DAILY BEFORE BREAKFAST.  .Marland Kitchenloratadine (CLARITIN) 10 MG tablet Take 10 mg by mouth daily.  . metFORMIN (GLUCOPHAGE-XR) 500 MG 24 hr tablet  TAKE 4 TABLETS (2,000 MG TOTAL) BY MOUTH DAILY WITH SUPPER.  . Omega-3 Fatty Acids (FISH OIL) 1000 MG CAPS Take 2 capsules by mouth daily.  .Glory RosebushULTRA test strip USE AS INSTRUCTED TO CHECK BLOOD SUGAR THREE TIMES DAILY.  . pravastatin (PRAVACHOL) 40 MG tablet TAKE 1 TABLET BY MOUTH EVERY DAY  . VICTOZA 18 MG/3ML SOPN INJECT 0.3 MLS (1.8 MG TOTAL) INTO THE SKIN DAILY. INJECT ONCE DAILY AT THE SAME TIME  . Vitamin D, Ergocalciferol, (DRISDOL) 1.25 MG (50000 UNIT) CAPS capsule Take 1 capsule (50,000 Units total) by mouth every 7 (seven) days.  .Alveda Reasons20 MG TABS tablet TAKE 1 TABLET (20 MG TOTAL) BY MOUTH DAILY WITH SUPPER.     Allergies:   Simvastatin and Sulfa antibiotics   Past Medical History:  Diagnosis Date  . Abnormal electrocardiogram 08/14/2014   LBBB  . Anemia   . Anxiety   . Arthritis   . Atrial fibrillation ([place]   . Bilateral swelling of feet   . Chest pain   . Constipation   . Depression   . Diabetes mellitus ([place]   . Difficulty walking   . 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  Dyspnea 08/14/2014  . Edema   . Gallbladder problem   . GERD (gastroesophageal reflux disease)   . Hyperlipidemia   . Hypertension   . LOW BACK PAIN 12/20/2009   Qualifier: Diagnosis of  By: Aline Brochure MD, Dorothyann Peng    . Morbid obesity ([place])   . OSA (obstructive sleep apnea) 08/16/2014  . Osteoarthritis   . Other fatigue   . Palpitations   . Seasonal allergies   . Shortness of breath   . Shortness of breath on exertion   . Sinusitis   . SPINAL STENOSIS 12/20/2009   Qualifier: Diagnosis of  By: Aline Brochure MD, Dorothyann Peng    . Stage 3 chronic kidney disease [facility])     Past Surgical History:  Procedure Laterality Date  . CHOLECYSTECTOMY    . COLONOSCOPY WITH PROPOFOL N/A 11/29/2014   Procedure: COLONOSCOPY WITH PROPOFOL;  Surgeon: Arta Silence, MD;  Location: [facility] ENDOSCOPY;  Service: Endoscopy;  Laterality: N/A;  . SKIN CANCER EXCISION     . TONSILLECTOMY       Social History:  The patient  reports that she quit smoking about 27 years ago. Her smoking use included cigarettes. She has a 40.00 pack-year smoking history. She has quit using smokeless tobacco. She reports current alcohol use. She reports that she does not use drugs.   Family History:  The patient's family history includes Anxiety disorder in her father and mother; Bipolar disorder in her mother; Cancer in her father; Cancer - Lung in her father; Depression in her father and mother; Diabetes in her father; Eating disorder in her mother; Heart disease in her father; Hyperlipidemia in her father and mother; Hypertension in her father and mother; Kidney disease in her mother; Obesity in her mother; Stroke in her mother.    ROS:  Please see the history of present illness. All other systems are reviewed and  Negative to the above problem except as noted.    PHYSICAL EXAM: VS:  BP 132/74   Pulse 82   Ht _0  (1.575 m)   Wt 290 lb (131.5 kg)   SpO2 97%   BMI 53.04 kg/m   GEN:  Morbidly obese 70 yo , in no acute distress  HEENT: normal  Neck: JVP is normal   Cardiac:RRR S1, S2   No murmurs  Triv LE  edema Legs soft   Respiratory:  clear to auscultation bilaterally, GI: soft, nontender, nondistended, + BS  No hepatomegaly  MS: no deformity Moving all extremities   Skin: warm and dry, no rash Neuro:  Strength and sensation are intact    EKG:  EKG is not ordered today.   11/2020 Echo  1. Left ventricular ejection fraction, by estimation, is 55 to 60%. The left ventricle has normal function. The left ventricle has no regional wall motion abnormalities. There is moderate concentric left ventricular hypertrophy. Left ventricular diastolic parameters are consistent with Grade I diastolic dysfunction (impaired relaxation). 2. Right ventricular systolic function is normal. The right ventricular size is normal. There is normal pulmonary artery systolic pressure. 3.  The mitral valve is normal in structure. Mild mitral valve regurgitation. No evidence of mitral stenosis. 4. The aortic valve is tricuspid. Aortic valve regurgitation is not visualized. No aortic stenosis is present. Aortic valve mean gradient measures 9.0 mmHg. 5. The inferior vena cava is normal in size with greater than 50% respiratory variability, suggesting right atrial pressure of 3 mmHg. Comparison(s): A prior study was performed on 09/20/2017. No significant change from prior study.  Prior images reviewed side by side.  Lipid Panel    Component Value Date/Time   CHOL 141 09/17/2020 1108   TRIG 127 10/23/2020 0430   HDL 42.40 09/17/2020 1108   CHOLHDL 3 09/17/2020 1108   VLDL 33.0 09/17/2020 1108   LDLCALC 65 09/17/2020 1108   LDLDIRECT 72.0 02/28/2020 1124      Wt Readings from Last 3 Encounters:  03/15/21 290 lb (131.5 kg)  02/27/21 286 lb (129.7 kg)  02/27/21 289 lb (131.1 kg)      ASSESSMENT AND PLAN: 1   Atrial fib PAF   Found on event monitor Had some when hosp with COVID  Denies symptoms  Pt's CHADS2VASc is 3 / 4     Keep on anticoagulation.        2   Dyspnea  Breathing is good   Continue to watch Na   3  LBBB  No ischemia on myovue in 2018  4   DM   Last A1C 8.2   Discussed diet      5.   LIpdis  LDL 59   HDL 39  Trig 134  Plan for f/u next winter    Current medicines are reviewed at length with the patient today.  The patient does not have concerns regarding medicines.  Signed, Dorris Carnes, MD  03/15/2021 [facility] [place], [place], [place]  [address] Phone: [phone]; Fax: [phone]

## 2021-03-17 ENCOUNTER — Other Ambulatory Visit: Payer: Self-pay | Admitting: Internal Medicine

## 2021-03-18 NOTE — Telephone Encounter (Signed)
Prescription refill request for Xarelto received.  Indication: afib  Last office visit: Melanie Reeves 11/30/2020 Weight: 131.5 kg  Age: 70 yo  Scr: 0.91, 12/18/2020 CrCl: 124 ml/min   Pt is on the correct dose of Xarelto per dosing criteria, prescription refill sent for Xarelto.

## 2021-03-19 ENCOUNTER — Other Ambulatory Visit (INDEPENDENT_AMBULATORY_CARE_PROVIDER_SITE_OTHER): Payer: PPO

## 2021-03-19 ENCOUNTER — Other Ambulatory Visit: Payer: Self-pay

## 2021-03-19 DIAGNOSIS — E1165 Type 2 diabetes mellitus with hyperglycemia: Secondary | ICD-10-CM

## 2021-03-19 DIAGNOSIS — Z794 Long term (current) use of insulin: Secondary | ICD-10-CM | POA: Diagnosis not present

## 2021-03-19 LAB — HEMOGLOBIN A1C: Hgb A1c MFr Bld: 6.5 % (ref 4.6–6.5)

## 2021-03-20 LAB — BASIC METABOLIC PANEL
BUN: 38 mg/dL — ABNORMAL HIGH (ref 6–23)
CO2: 25 mEq/L (ref 19–32)
Calcium: 9.6 mg/dL (ref 8.4–10.5)
Chloride: 104 mEq/L (ref 96–112)
Creatinine, Ser: 1.12 mg/dL (ref 0.40–1.20)
GFR: 50.76 mL/min — ABNORMAL LOW (ref >60.00)
Glucose, Bld: 127 mg/dL — ABNORMAL HIGH (ref 70–99)
Potassium: 4.4 mEq/L (ref 3.5–5.1)
Sodium: 141 mEq/L (ref 135–145)

## 2021-03-22 ENCOUNTER — Ambulatory Visit: Payer: PPO | Admitting: Endocrinology

## 2021-03-24 DIAGNOSIS — L03116 Cellulitis of left lower limb: Secondary | ICD-10-CM | POA: Diagnosis not present

## 2021-03-24 NOTE — Procedures (Signed)
   NAME: Melanie Reeves DATE OF BIRTH:  03-17-1951 MEDICAL RECORD NUMBER [id number]  LOCATION: [facility]  PHYSICIAN: Deretha Emory  DATE OF STUDY: 02/27/2021  SLEEP STUDY TYPE: Positive Airway Pressure Titration               REFERRING PHYSICIAN: Deretha Emory, MD  EPWORTH SLEEPINESS SCORE:  19 HEIGHT: 5\' 2"  (157.5 cm)  WEIGHT: 286 lb (129.7 kg)    Body mass index is 52.31 kg/m.  NECK SIZE: 17 in.  CLINICAL INFORMATION The patient was referred to the sleep center for PAP titration in patient with morbid obesity, mild OSA, moderate hypoxemia and high normal bicarbonate. Concern for Obesity Hypoventilation Syndrome so PAP titration indicated. HSAT results: 12/04/20 AHI 8/hr, O2 min 80% with 8 minutes < 89%.  MEDICATIONS Patient self administered medications include: METFORMIN, xarrelto, GABAPENTIN, CYMBALTA, FLEXERIL, BENADRYL. Medications administered during study include Sleep medicine administered - BENADRYL 25MG  X 2 at 10:00:00 PM.  SLEEP STUDY TECHNIQUE The patient underwent an attended overnight polysomnography titration to assess the effects of cpap therapy. The following variables were monitored: EEG(C4-A1, C3-A2, O1-A2, O2-A1), EOG, submental and leg EMG, ECG, oxyhemoglobin saturation by pulse oximetry, thoracic and abdominal respiratory effort belts, nasal/oral airflow by pressure sensor, body position sensor and snoring sensor. CPAP pressure was titrated to eliminate apneas, hypopneas and oxygen desaturation.  TECHNICAL COMMENTS Comments added by Technician: PATIENT WAS ORDERED AS A CPAP TITRATION. Comments added by Scorer: N/A  SLEEP ARCHITECTURE The study was initiated at 11:03:40 PM and terminated at 5:04:19 AM. Total recorded time was 360.6 minutes. EEG confirmed total sleep time was 316 minutes yielding a sleep efficiency of 87.6%%. Sleep onset after lights out was 5.1 minutes with a REM latency of 242.0 minutes. The patient spent 12.8%% of the  night in stage N1 sleep, 81.0%% in stage N2 sleep, 0.0%% in stage N3 and 6.2% in REM. The Arousal Index was 20.7/hour.  RESPIRATORY PARAMETERS The overall AHI was 1.5 per hour, and the RDI was 7.4 events/hour with a central apnea index of 0.4per hour. The most appropriate setting of CPAP was IPAP/EPAP 13/13 cm H2O. At this setting, the sleep efficiency was 71% and the patient was supine for 50%. The AHI was 0 events per hour, and the RDI was 6.6 events/hour (with 0.4 central events) and the arousal index was 24.3 per hour at optimal pressure.The oxygen nadir was 91.0% during sleep at optimal pressure.  LEG MOVEMENT DATA The total leg movements were 25 with a resulting leg movement index of 4.7/hr. Associated arousal with leg movement index was 0.4/hr.  CARDIAC DATA The underlying cardiac rhythm was most consistent with sinus rhythm. Mean heart rate during sleep was 83.1 bpm. Additional rhythm abnormalities include PVCs.  The patient stated that her sleep in this study was better than usual sleep at home.   IMPRESSIONS - Obstructive Sleep Apnea (OSA). Optimal pressure attained. - Mild leg movements, few to arousal  DIAGNOSIS - Obstructive Sleep Apnea (G47.33)  RECOMMENDATIONS - Trial of auto-CPAP 8-16 or CPAP therapy on 13 cm H2O with a Petite size 12/06/20 Full Face Mask Amara Gel mask and heated humidification. Ramp from 4 for patient comfort.  Sleep specialist, American Board of Internal Medicine  ELECTRONICALLY SIGNED ON:  03/24/2021, 2:24 PM [facility] PH: [phone]   FX: [phone] ACCREDITED BY THE AMERICAN ACADEMY OF SLEEP MEDICINE

## 2021-03-26 ENCOUNTER — Ambulatory Visit: Payer: PPO | Admitting: Endocrinology

## 2021-03-26 ENCOUNTER — Encounter (HOSPITAL_BASED_OUTPATIENT_CLINIC_OR_DEPARTMENT_OTHER): Payer: Self-pay | Admitting: *Deleted

## 2021-03-26 ENCOUNTER — Inpatient Hospital Stay (HOSPITAL_BASED_OUTPATIENT_CLINIC_OR_DEPARTMENT_OTHER)
Admission: EM | Admit: 2021-03-26 | Discharge: 2021-04-05 | DRG: 602 | Disposition: A | Discharge status: Home / Self Care | Payer: PPO | Source: Ambulatory Visit | Attending: Internal Medicine | Admitting: Internal Medicine

## 2021-03-26 ENCOUNTER — Other Ambulatory Visit: Payer: Self-pay

## 2021-03-26 DIAGNOSIS — J302 Other seasonal allergic rhinitis: Secondary | ICD-10-CM | POA: Diagnosis present

## 2021-03-26 DIAGNOSIS — Z7984 Long term (current) use of oral hypoglycemic drugs: Secondary | ICD-10-CM

## 2021-03-26 DIAGNOSIS — I5033 Acute on chronic diastolic (congestive) heart failure: Secondary | ICD-10-CM | POA: Diagnosis not present

## 2021-03-26 DIAGNOSIS — E1142 Type 2 diabetes mellitus with diabetic polyneuropathy: Secondary | ICD-10-CM | POA: Diagnosis not present

## 2021-03-26 DIAGNOSIS — Z794 Long term (current) use of insulin: Secondary | ICD-10-CM

## 2021-03-26 DIAGNOSIS — I10 Essential (primary) hypertension: Secondary | ICD-10-CM | POA: Diagnosis not present

## 2021-03-26 DIAGNOSIS — M7989 Other specified soft tissue disorders: Secondary | ICD-10-CM | POA: Diagnosis not present

## 2021-03-26 DIAGNOSIS — F419 Anxiety disorder, unspecified: Secondary | ICD-10-CM | POA: Diagnosis not present

## 2021-03-26 DIAGNOSIS — I5032 Chronic diastolic (congestive) heart failure: Secondary | ICD-10-CM | POA: Diagnosis not present

## 2021-03-26 DIAGNOSIS — Z8349 Family history of other endocrine, nutritional and metabolic diseases: Secondary | ICD-10-CM

## 2021-03-26 DIAGNOSIS — Z20822 Contact with and (suspected) exposure to covid-19: Secondary | ICD-10-CM | POA: Diagnosis not present

## 2021-03-26 DIAGNOSIS — E11649 Type 2 diabetes mellitus with hypoglycemia without coma: Secondary | ICD-10-CM | POA: Diagnosis not present

## 2021-03-26 DIAGNOSIS — M1990 Unspecified osteoarthritis, unspecified site: Secondary | ICD-10-CM | POA: Diagnosis not present

## 2021-03-26 DIAGNOSIS — R609 Edema, unspecified: Secondary | ICD-10-CM

## 2021-03-26 DIAGNOSIS — I872 Venous insufficiency (chronic) (peripheral): Secondary | ICD-10-CM | POA: Diagnosis not present

## 2021-03-26 DIAGNOSIS — K219 Gastro-esophageal reflux disease without esophagitis: Secondary | ICD-10-CM | POA: Diagnosis not present

## 2021-03-26 DIAGNOSIS — Z6841 Body Mass Index (BMI) 40.0 and over, adult: Secondary | ICD-10-CM

## 2021-03-26 DIAGNOSIS — R601 Generalized edema: Secondary | ICD-10-CM | POA: Diagnosis not present

## 2021-03-26 DIAGNOSIS — Z833 Family history of diabetes mellitus: Secondary | ICD-10-CM

## 2021-03-26 DIAGNOSIS — F321 Major depressive disorder, single episode, moderate: Secondary | ICD-10-CM | POA: Diagnosis not present

## 2021-03-26 DIAGNOSIS — M19072 Primary osteoarthritis, left ankle and foot: Secondary | ICD-10-CM | POA: Diagnosis not present

## 2021-03-26 DIAGNOSIS — F32A Depression, unspecified: Secondary | ICD-10-CM | POA: Diagnosis not present

## 2021-03-26 DIAGNOSIS — N179 Acute kidney failure, unspecified: Secondary | ICD-10-CM | POA: Diagnosis not present

## 2021-03-26 DIAGNOSIS — L538 Other specified erythematous conditions: Secondary | ICD-10-CM | POA: Diagnosis not present

## 2021-03-26 DIAGNOSIS — M79672 Pain in left foot: Secondary | ICD-10-CM | POA: Diagnosis present

## 2021-03-26 DIAGNOSIS — E1140 Type 2 diabetes mellitus with diabetic neuropathy, unspecified: Secondary | ICD-10-CM

## 2021-03-26 DIAGNOSIS — Z888 Allergy status to other drugs, medicaments and biological substances status: Secondary | ICD-10-CM

## 2021-03-26 DIAGNOSIS — G4733 Obstructive sleep apnea (adult) (pediatric): Secondary | ICD-10-CM | POA: Diagnosis not present

## 2021-03-26 DIAGNOSIS — M79605 Pain in left leg: Secondary | ICD-10-CM | POA: Diagnosis not present

## 2021-03-26 DIAGNOSIS — I480 Paroxysmal atrial fibrillation: Secondary | ICD-10-CM | POA: Diagnosis present

## 2021-03-26 DIAGNOSIS — R600 Localized edema: Secondary | ICD-10-CM

## 2021-03-26 DIAGNOSIS — E559 Vitamin D deficiency, unspecified: Secondary | ICD-10-CM

## 2021-03-26 DIAGNOSIS — Z87891 Personal history of nicotine dependence: Secondary | ICD-10-CM

## 2021-03-26 DIAGNOSIS — I110 Hypertensive heart disease with heart failure: Secondary | ICD-10-CM | POA: Diagnosis not present

## 2021-03-26 DIAGNOSIS — Z823 Family history of stroke: Secondary | ICD-10-CM

## 2021-03-26 DIAGNOSIS — L03116 Cellulitis of left lower limb: Principal | ICD-10-CM | POA: Diagnosis present

## 2021-03-26 DIAGNOSIS — Z8249 Family history of ischemic heart disease and other diseases of the circulatory system: Secondary | ICD-10-CM

## 2021-03-26 DIAGNOSIS — N1831 Chronic kidney disease, stage 3a: Secondary | ICD-10-CM | POA: Diagnosis not present

## 2021-03-26 DIAGNOSIS — Z801 Family history of malignant neoplasm of trachea, bronchus and lung: Secondary | ICD-10-CM

## 2021-03-26 DIAGNOSIS — Z85828 Personal history of other malignant neoplasm of skin: Secondary | ICD-10-CM | POA: Diagnosis not present

## 2021-03-26 DIAGNOSIS — E1136 Type 2 diabetes mellitus with diabetic cataract: Secondary | ICD-10-CM | POA: Diagnosis not present

## 2021-03-26 DIAGNOSIS — E782 Mixed hyperlipidemia: Secondary | ICD-10-CM | POA: Diagnosis not present

## 2021-03-26 DIAGNOSIS — E1165 Type 2 diabetes mellitus with hyperglycemia: Secondary | ICD-10-CM | POA: Diagnosis not present

## 2021-03-26 DIAGNOSIS — Z7901 Long term (current) use of anticoagulants: Secondary | ICD-10-CM

## 2021-03-26 DIAGNOSIS — M7732 Calcaneal spur, left foot: Secondary | ICD-10-CM | POA: Diagnosis not present

## 2021-03-26 DIAGNOSIS — E876 Hypokalemia: Secondary | ICD-10-CM | POA: Diagnosis not present

## 2021-03-26 DIAGNOSIS — E785 Hyperlipidemia, unspecified: Secondary | ICD-10-CM | POA: Diagnosis present

## 2021-03-26 DIAGNOSIS — Z79899 Other long term (current) drug therapy: Secondary | ICD-10-CM

## 2021-03-26 DIAGNOSIS — E119 Type 2 diabetes mellitus without complications: Secondary | ICD-10-CM | POA: Diagnosis not present

## 2021-03-26 DIAGNOSIS — Z882 Allergy status to sulfonamides status: Secondary | ICD-10-CM

## 2021-03-26 DIAGNOSIS — I4891 Unspecified atrial fibrillation: Secondary | ICD-10-CM | POA: Diagnosis not present

## 2021-03-26 DIAGNOSIS — Z841 Family history of disorders of kidney and ureter: Secondary | ICD-10-CM

## 2021-03-26 DIAGNOSIS — E1122 Type 2 diabetes mellitus with diabetic chronic kidney disease: Secondary | ICD-10-CM | POA: Diagnosis not present

## 2021-03-26 DIAGNOSIS — L0390 Cellulitis, unspecified: Secondary | ICD-10-CM | POA: Insufficient documentation

## 2021-03-26 DIAGNOSIS — M8580 Other specified disorders of bone density and structure, unspecified site: Secondary | ICD-10-CM | POA: Diagnosis not present

## 2021-03-26 DIAGNOSIS — E1169 Type 2 diabetes mellitus with other specified complication: Secondary | ICD-10-CM | POA: Diagnosis present

## 2021-03-26 LAB — COMPREHENSIVE METABOLIC PANEL
ALT: 27 U/L (ref 0–44)
AST: 21 U/L (ref 15–41)
Albumin: 4 g/dL (ref 3.5–5.0)
Alkaline Phosphatase: 47 U/L (ref 38–126)
Anion gap: 10 (ref 5–15)
BUN: 28 mg/dL — ABNORMAL HIGH (ref 8–23)
CO2: 23 mmol/L (ref 22–32)
Calcium: 10 mg/dL (ref 8.9–10.3)
Chloride: 106 mmol/L (ref 98–111)
Creatinine, Ser: 0.85 mg/dL (ref 0.44–1.00)
GFR, Estimated: >60 mL/min (ref >60)
Glucose, Bld: 57 mg/dL — ABNORMAL LOW (ref 70–99)
Potassium: 3.9 mmol/L (ref 3.5–5.1)
Sodium: 139 mmol/L (ref 135–145)
Total Bilirubin: 0.3 mg/dL (ref 0.3–1.2)
Total Protein: 8 g/dL (ref 6.5–8.1)

## 2021-03-26 LAB — RESP PANEL BY RT-PCR (FLU A&B, COVID) ARPGX2
Influenza A by PCR: NEGATIVE
Influenza B by PCR: NEGATIVE
SARS Coronavirus 2 by RT PCR: NEGATIVE

## 2021-03-26 LAB — CBC WITH DIFFERENTIAL/PLATELET
Abs Immature Granulocytes: 0.16 10*3/uL — ABNORMAL HIGH (ref 0.00–0.07)
Basophils Absolute: 0.1 10*3/uL (ref 0.0–0.1)
Basophils Relative: 1 %
Eosinophils Absolute: 0.2 10*3/uL (ref 0.0–0.5)
Eosinophils Relative: 2 %
HCT: 38.5 % (ref 36.0–46.0)
Hemoglobin: 11.7 g/dL — ABNORMAL LOW (ref 12.0–15.0)
Immature Granulocytes: 1 %
Lymphocytes Relative: 26 %
Lymphs Abs: 3.3 10*3/uL (ref 0.7–4.0)
MCH: 26.8 pg (ref 26.0–34.0)
MCHC: 30.4 g/dL (ref 30.0–36.0)
MCV: 88.3 fL (ref 80.0–100.0)
Monocytes Absolute: 0.8 10*3/uL (ref 0.1–1.0)
Monocytes Relative: 6 %
Neutro Abs: 7.9 10*3/uL — ABNORMAL HIGH (ref 1.7–7.7)
Neutrophils Relative %: 64 %
Platelets: 365 10*3/uL (ref 150–400)
RBC: 4.36 MIL/uL (ref 3.87–5.11)
RDW: 15.2 % (ref 11.5–15.5)
WBC: 12.3 10*3/uL — ABNORMAL HIGH (ref 4.0–10.5)
nRBC: 0 % (ref 0.0–0.2)

## 2021-03-26 LAB — URINALYSIS, ROUTINE W REFLEX MICROSCOPIC
Bilirubin Urine: NEGATIVE
Glucose, UA: >1000 mg/dL — AB
Hgb urine dipstick: NEGATIVE
Ketones, ur: NEGATIVE mg/dL
Leukocytes,Ua: NEGATIVE
Nitrite: NEGATIVE
Protein, ur: NEGATIVE mg/dL
Specific Gravity, Urine: 1.02 (ref 1.005–1.030)
pH: 5.5 (ref 5.0–8.0)

## 2021-03-26 LAB — LACTIC ACID, PLASMA
Lactic Acid, Venous: 0.9 mmol/L (ref 0.5–1.9)
Lactic Acid, Venous: 1.1 mmol/L (ref 0.5–1.9)

## 2021-03-26 LAB — CBG MONITORING, ED
Glucose-Capillary: 51 mg/dL — ABNORMAL LOW (ref 70–99)
Glucose-Capillary: 118 mg/dL — ABNORMAL HIGH (ref 70–99)

## 2021-03-26 LAB — PROTIME-INR
INR: 1.3 — ABNORMAL HIGH (ref 0.8–1.2)
Prothrombin Time: 16.5 seconds — ABNORMAL HIGH (ref 11.4–15.2)

## 2021-03-26 LAB — APTT: aPTT: 46 seconds — ABNORMAL HIGH (ref 24–36)

## 2021-03-26 MED ORDER — SODIUM CHLORIDE 0.9 % IV SOLN
2.0000 g | Freq: Three times a day (TID) | INTRAVENOUS | Status: DC
  Administered 2021-03-27 – 2021-03-28 (×4): 2 g via INTRAVENOUS
  Filled 2021-03-26 (×5): qty 2

## 2021-03-26 MED ORDER — VANCOMYCIN HCL IN DEXTROSE 1-5 GM/200ML-% IV SOLN: 1000.0000 mg | Freq: Once | INTRAVENOUS | Status: DC

## 2021-03-26 MED ORDER — SODIUM CHLORIDE 0.9 % IV SOLN
2.0000 g | Freq: Once | INTRAVENOUS | Status: AC
  Administered 2021-03-26: 2 g via INTRAVENOUS
  Filled 2021-03-26: qty 2

## 2021-03-26 MED ORDER — SODIUM CHLORIDE 0.9 % IV SOLN
INTRAVENOUS | Status: DC | PRN
  Administered 2021-03-26: 250 mL via INTRAVENOUS

## 2021-03-26 MED ORDER — LACTATED RINGERS IV SOLN: INTRAVENOUS | Status: DC

## 2021-03-26 MED ORDER — INSULIN ASPART 100 UNIT/ML IJ SOLN
0.0000 [IU] | Freq: Three times a day (TID) | INTRAMUSCULAR | Status: DC
  Administered 2021-03-27: 1 [IU] via SUBCUTANEOUS
  Administered 2021-03-28 – 2021-03-29 (×4): 2 [IU] via SUBCUTANEOUS
  Administered 2021-03-29: 1 [IU] via SUBCUTANEOUS
  Administered 2021-03-29: 3 [IU] via SUBCUTANEOUS
  Administered 2021-03-30: 2 [IU] via SUBCUTANEOUS
  Administered 2021-03-30: 7 [IU] via SUBCUTANEOUS
  Administered 2021-03-30: 2 [IU] via SUBCUTANEOUS
  Administered 2021-03-31: 7 [IU] via SUBCUTANEOUS
  Administered 2021-03-31: 2 [IU] via SUBCUTANEOUS
  Administered 2021-03-31: 3 [IU] via SUBCUTANEOUS
  Administered 2021-04-01: 5 [IU] via SUBCUTANEOUS
  Administered 2021-04-01: 2 [IU] via SUBCUTANEOUS
  Administered 2021-04-01 – 2021-04-02 (×2): 3 [IU] via SUBCUTANEOUS
  Administered 2021-04-02 (×2): 5 [IU] via SUBCUTANEOUS
  Administered 2021-04-03 (×2): 2 [IU] via SUBCUTANEOUS
  Administered 2021-04-03: 7 [IU] via SUBCUTANEOUS
  Administered 2021-04-04: 5 [IU] via SUBCUTANEOUS
  Administered 2021-04-04 – 2021-04-05 (×4): 3 [IU] via SUBCUTANEOUS
  Administered 2021-04-05: 5 [IU] via SUBCUTANEOUS

## 2021-03-26 MED ORDER — ACETAMINOPHEN 650 MG RE SUPP: 650.0000 mg | Freq: Four times a day (QID) | RECTAL | Status: DC | PRN

## 2021-03-26 MED ORDER — VANCOMYCIN HCL IN DEXTROSE 1-5 GM/200ML-% IV SOLN
1000.0000 mg | Freq: Two times a day (BID) | INTRAVENOUS | Status: DC
  Administered 2021-03-27 – 2021-03-28 (×3): 1000 mg via INTRAVENOUS
  Filled 2021-03-26 (×4): qty 200

## 2021-03-26 MED ORDER — METHYLPREDNISOLONE SODIUM SUCC 40 MG IJ SOLR: 40.0000 mg | Freq: Four times a day (QID) | INTRAMUSCULAR | Status: DC

## 2021-03-26 MED ORDER — METRONIDAZOLE 500 MG/100ML IV SOLN
500.0000 mg | Freq: Once | INTRAVENOUS | Status: AC
  Administered 2021-03-26: 500 mg via INTRAVENOUS
  Filled 2021-03-26: qty 100

## 2021-03-26 MED ORDER — LACTATED RINGERS IV BOLUS (SEPSIS)
250.0000 mL | Freq: Once | INTRAVENOUS | Status: AC
  Administered 2021-03-26: 250 mL via INTRAVENOUS

## 2021-03-26 MED ORDER — ACETAMINOPHEN 325 MG PO TABS
650.0000 mg | ORAL_TABLET | Freq: Four times a day (QID) | ORAL | Status: DC | PRN
  Administered 2021-03-27: 650 mg via ORAL
  Filled 2021-03-26: qty 2

## 2021-03-26 MED ORDER — VANCOMYCIN HCL IN DEXTROSE 1-5 GM/200ML-% IV SOLN
1000.0000 mg | Freq: Once | INTRAVENOUS | Status: AC
  Administered 2021-03-26: 1000 mg via INTRAVENOUS
  Filled 2021-03-26: qty 200

## 2021-03-26 MED ORDER — VANCOMYCIN HCL IN DEXTROSE 1-5 GM/200ML-% IV SOLN
1000.0000 mg | Freq: Once | INTRAVENOUS | Status: AC
  Administered 2021-03-27: 1000 mg via INTRAVENOUS
  Filled 2021-03-26: qty 200

## 2021-03-26 MED ORDER — SODIUM CHLORIDE 0.9 % IV SOLN
INTRAVENOUS | Status: DC | PRN
  Administered 2021-03-26 – 2021-04-03 (×4): 250 mL via INTRAVENOUS

## 2021-03-26 NOTE — ED Notes (Signed)
Report given to [facility] - states they are otw - ETA in  10 mins

## 2021-03-26 NOTE — Sepsis Progress Note (Signed)
[facility] tracking the Code Sepsis. 

## 2021-03-26 NOTE — Plan of Care (Signed)
[facility] will assume care on arrival to accepting facility. Until arrival, care as per EDP. However, [facility] available 24/7 for questions and assistance.  

## 2021-03-26 NOTE — Progress Notes (Signed)
This encounter was created in error - please disregard.

## 2021-03-26 NOTE — H&P (Signed)
History and Physical    PLEASE NOTE THAT DRAGON DICTATION SOFTWARE WAS USED IN THE CONSTRUCTION OF THIS NOTE.   Melanie Reeves DOB: [birth year] DOA: 03/26/2021  PCP: Vernie Shanks, MD Patient coming from: home   I have personally briefly reviewed patient's old medical records in [place]  Chief Complaint: Left foot redness  HPI: Melanie Reeves is a 70 y.o. female with medical history significant for chronic diastolic heart failure, paroxysmal atrial fibrillation chronically anticoagulated on Xarelto, type 2 diabetes mellitus, who is admitted to [facility] on 03/26/2021  By way of transfer from [facility] ED after presenting from home to the left facility complaining left foot erythema.   The patient reports 4 to 5 days of left foot erythema, noting that the redness started on the dorsal aspect of the fourth subsequently extending approximately past the ankle and up the anterior aspect of the lower extremity terminating approximately 6 inches distal to the left knee.  She reports associated tenderness, increased warmth, swelling.  She also notes subjective fever and chills in the absence of full body rigors or generalized myalgias.  No preceding trauma or known break in the skin integrity, including no diabetic foot ulcers.  Not associated with any numbness or paresthesias associated with the left lower extremity relative to her reported baseline of peripheral polyneuropathy impacting the bilateral feet.  Left foot erythema is not associate with any drainage, including no purulent drainage.  In the setting, she reports that she was evaluated by her PCP this on 03/23/2021, which time she was diagnosed with left lower extremity cellulitis and started on doxycycline.  She reports good compliance with this antibiotic on a twice daily basis, with first dose occurring on 03/24/2021.  In spite of interval compliance with this medication, she reported no significant  improvement, while feeling that the distribution of the erythema and associated increased warmth, tenderness, and swelling progressed to his current distribution proximal to the left ankle.  Earlier today, she was reevaluated by her PCP, who agreed the left lower extremity demonstrated interval clinical worsening in terms of Distribution of the associated erythema, and recommended presenting to the local emergency department for further evaluation and management of suspected cellulitis with interval progression in spite of compliance with outpatient antibiotics.   Denies any known history of peripheral vascular disease.  She acknowledges type 2 diabetes mellitus for which she is on both basal as well as short acting insulin in addition to Canagliflozin and metformin as an outpatient, with chart review revealing most recent hemoglobin A1c of 6.5% on 03/19/2021.  She notes that her diabetes is complicated by peripheral polyneuropathy.   Denies any associated recent headache, neck stiffness, sore throat, shortness of breath, wheezing, cough, abdominal pain, diarrhea, or rash in any additional site.  No recent known COVID-19 exposures.  Denies any recent dysuria, gross materia, or change in urinary urgency/frequency.  She also denies any recent chest pain palpitations, diaphoresis, dizziness.  No recent swelling involving the right lower extremity.  Denies any recent orthopnea or PND.  Per chart review, she has a history of chronic diastolic heart failure, with most recent echocardiogram performed in February 2022 and demonstrating LVEF 55 to 60%, no focal wall motion normalities, moderate concentric LVH, and grade 1 diastolic dysfunction in the absence of any significant valvular pathology.    [facility] ED Course:  Vital signs in the ED were notable for the following: temp max 98.6, heart rate 72-83, blood pressure 117/70 -147/83;  respiratory rate 15-19, oxygen saturation 97 to 100% on room air.  Labs were  notable for the following: CMP was notable for sodium 139, bicarbonate 23, creatinine 0.85 relative to baseline creatinine ranges 0.8-1.1, and liver enzymes were found to be within normal limits.  CBC notable for white cell count of 12,300.  Lactic acid 1.1.  Urinalysis showed no white blood cells, nitrate negative, leukocyte Estrace negative, and was associate with specific gravity of 1.020.  Screening nasopharyngeal COVID-19/influenza PCR performed in the emergency department today and found to be negative.  While at [facility] ED, the following were administered: IV vancomycin, cefepime, and a single dose of IV Flagyl.  Additionally, she received 250 cc LR bolus prior to transition to continuous LR at 150 cc/h.  Subsequently, the patient was excepted for transfer to [facility] to the [place] unit for further evaluation and management of left lower extremity cellulitis in the setting of interval progression of her symptoms and spite of good compliance with outpatient doxycycline.    Review of Systems: As per HPI otherwise 10 point review of systems negative.   Past Medical History:  Diagnosis Date   Abnormal electrocardiogram 08/14/2014   LBBB   Anemia    Anxiety    Arthritis    Atrial fibrillation (HCC)    Bilateral swelling of feet    Chest pain    Constipation    Depression    Diabetes mellitus ([place])    Difficulty walking    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   Qualifier: Diagnosis of  By: Aline Brochure MD, Stanley     Dyspnea 08/14/2014   Edema    Gallbladder problem    GERD (gastroesophageal reflux disease)    Hyperlipidemia    Hypertension    LOW BACK PAIN 12/20/2009   Qualifier: Diagnosis of  By: Aline Brochure MD, Stanley     Morbid obesity ([place])    OSA (obstructive sleep apnea) 08/16/2014   Osteoarthritis    Other fatigue    Palpitations    Seasonal allergies    Shortness of breath    Shortness of breath on exertion    Sinusitis    SPINAL STENOSIS 12/20/2009   Qualifier: Diagnosis of   By: Aline Brochure MD, Stanley     Stage 3 chronic kidney disease [facility])     Past Surgical History:  Procedure Laterality Date   CHOLECYSTECTOMY     COLONOSCOPY WITH PROPOFOL N/A 11/29/2014   Procedure: COLONOSCOPY WITH PROPOFOL;  Surgeon: Arta Silence, MD;  Location: [facility] ENDOSCOPY;  Service: Endoscopy;  Laterality: N/A;   SKIN CANCER EXCISION     TONSILLECTOMY      Social History:  reports that she quit smoking about 27 years ago. Her smoking use included cigarettes. She has a 40.00 pack-year smoking history. She has quit using smokeless tobacco. She reports current alcohol use. She reports that she does not use drugs.   Allergies  Allergen Reactions   Simvastatin     Other reaction(s): leg weakness Other reaction(s): leg weakness   Sulfa Antibiotics Other (See Comments)    Cant remember Other reaction(s): as a child    Family History  Problem Relation Age of Onset   Heart disease Father        MI   Cancer - Lung Father        Small cell Lung CA   Diabetes Father    Hypertension Father    Hyperlipidemia Father    Cancer Father    Depression Father    Anxiety  disorder Father    Hypertension Mother    Hyperlipidemia Mother    Stroke Mother    Kidney disease Mother    Depression Mother    Anxiety disorder Mother    Bipolar disorder Mother    Eating disorder Mother    Obesity Mother     Family history reviewed and not pertinent    Prior to Admission medications   Medication Sig Start Date End Date Taking? Authorizing Provider  atenolol (TENORMIN) 25 MG tablet Take 1 tablet (25 mg total) by mouth daily. 10/26/20  Yes Tat, Shanon Brow, MD  DULoxetine (CYMBALTA) 30 MG capsule TAKE 1 CAPSULE BY MOUTH DAILY. TAKE IN ADDITION TO THE 60 MG 02/01/21  Yes Elayne Snare, MD  DULoxetine (CYMBALTA) 60 MG capsule Take 60 mg by mouth daily. TAKE 1 TABLET BY MOUTH ONCE DAILY.   Yes [provider]  fenofibrate (TRICOR) 145 MG tablet Take 145 mg by mouth daily.   Yes [provider]  gabapentin (NEURONTIN) 600 MG tablet Take 1,200 mg by mouth 2 (two) times daily. Take 2 tablets (1222m total) by mouth twice daily.   Yes [provider]  insulin aspart (NOVOLOG FLEXPEN) 100 UNIT/ML FlexPen Inject 30-40 Units into the skin 3 (three) times daily with meals.   Yes [provider]  INVOKANA 300 MG TABS tablet TAKE 1 TABLET (300 MG TOTAL) BY MOUTH DAILY BEFORE BREAKFAST. 03/14/21  Yes KElayne Snare MD  loratadine (CLARITIN) 10 MG tablet Take 10 mg by mouth daily.   Yes [provider]  metFORMIN (GLUCOPHAGE-XR) 500 MG 24 hr tablet TAKE 4 TABLETS (2,000 MG TOTAL) BY MOUTH DAILY WITH SUPPER. 02/27/21  Yes KElayne Snare MD  Omega-3 Fatty Acids (FISH OIL) 1000 MG CAPS Take 2 capsules by mouth daily.   Yes [provider]  potassium chloride (MICRO-K) 10 MEQ CR capsule Take 10 mEq by mouth once a week. Along with the lasix   Yes [provider]  pravastatin (PRAVACHOL) 40 MG tablet TAKE 1 TABLET BY MOUTH EVERY DAY 02/01/21  Yes KElayne Snare MD  VICTOZA 18 MG/3ML SOPN INJECT 0.3 MLS (1.8 MG TOTAL) INTO THE SKIN DAILY. INJECT ONCE DAILY AT THE SAME TIME 11/22/20  Yes KElayne Snare MD  Vitamin D, Ergocalciferol, (DRISDOL) 1.25 MG (50000 UNIT) CAPS capsule Take 1 capsule (50,000 Units total) by mouth every 7 (seven) days. 01/30/21  Yes Beasley, Caren D, MD  XARELTO 20 MG TABS tablet TAKE 1 TABLET (20 MG TOTAL) BY MOUTH DAILY WITH SUPPER. 03/18/21  Yes RFay Records MD  Blood Glucose Monitoring Suppl (ONETOUCH VERIO) w/Device KIT UAD to monitor glucose tid; Dx: E11.65 07/09/18   KElayne Snare MD  cyclobenzaprine (FLEXERIL) 5 MG tablet Take 5 mg by mouth 3 (three) times daily as needed for muscle spasms.    [provider]  furosemide (LASIX) 40 MG tablet Take 40 mg by mouth once a week.    [provider]  insulin glargine, 2 Unit Dial, (TOUJEO MAX SOLOSTAR) 300 UNIT/ML Solostar Pen Inject 15 Units into the skin daily.     [provider]  Insulin Pen Needle (B-D ULTRAFINE III SHORT PEN) 31G X 8 MM MISC Use to inject medication 5 times daily. 09/02/19   KElayne Snare MD  [facility] test strip USE AS INSTRUCTED TO CHECK BLOOD SUGAR THREE TIMES DAILY. 10/09/19   KElayne Snare MD     Objective    Physical Exam: Vitals:   03/26/21 1845 03/26/21 2015 03/26/21  2045 03/26/21 2146  BP: (!) 108/58 (!) 141/86 132/88 134/75  Pulse: 83 76 76 77  Resp: _0 Temp:    98.1 F (36.7 C)  TempSrc:    Oral  SpO2: 98% 99% 98% 97%  Weight:      Height:        General: appears to be stated age; alert, oriented Skin: warm, dry, erythema over the dorsal aspect of the left foot with proximal extension over the anterior aspect of the left lower extremity terminating approximately 68 inches distal to the left knee and associated with increased warmth, tenderness, and swelling in the absence of pitting edema Head:  AT/White Mouth:  Oral mucosa membranes appear moist, normal dentition Neck: supple; trachea midline Heart:  RRR; did not appreciate any M/R/G Lungs: CTAB, did not appreciate any wheezes, rales, or rhonchi Abdomen: + BS; soft, ND, NT Vascular: 2+ pedal pulses b/l; 2+ radial pulses b/l Extremities: Left lower extremity erythema, tenderness, increased warmth, as further detailed above; no muscle wasting Neuro: strength and sensation intact in upper and lower extremities b/l    Labs on Admission: I have personally reviewed following labs and imaging studies  CBC: Recent Labs  Lab 03/26/21 1723  WBC 12.3*  NEUTROABS 7.9*  HGB 11.7*  HCT 38.5  MCV 88.3  PLT 660   Basic Metabolic Panel: Recent Labs  Lab 03/26/21 1723  NA 139  K 3.9  CL 106  CO2 23  GLUCOSE 57*  BUN 28*  CREATININE 0.85  CALCIUM 10.0   GFR: Estimated Creatinine Clearance: 84.6 mL/min (by C-G formula based on SCr of 0.85 mg/dL). Liver Function Tests: Recent Labs  Lab 03/26/21 1723  AST 21  ALT 27  ALKPHOS 47   BILITOT 0.3  PROT 8.0  ALBUMIN 4.0   No results for input(s): LIPASE, AMYLASE in the last 168 hours. No results for input(s): AMMONIA in the last 168 hours. Coagulation Profile: Recent Labs  Lab 03/26/21 1723  INR 1.3*   Cardiac Enzymes: No results for input(s): CKTOTAL, CKMB, CKMBINDEX, TROPONINI in the last 168 hours. BNP (last 3 results) No results for input(s): PROBNP in the last 8760 hours. HbA1C: No results for input(s): HGBA1C in the last 72 hours. CBG: Recent Labs  Lab 03/26/21 1718 03/26/21 1824  GLUCAP 51* 118*   Lipid Profile: No results for input(s): CHOL, HDL, LDLCALC, TRIG, CHOLHDL, LDLDIRECT in the last 72 hours. Thyroid Function Tests: No results for input(s): TSH, T4TOTAL, FREET4, T3FREE, THYROIDAB in the last 72 hours. Anemia Panel: No results for input(s): VITAMINB12, FOLATE, FERRITIN, TIBC, IRON, RETICCTPCT in the last 72 hours. Urine analysis:    Component Value Date/Time   COLORURINE YELLOW 03/26/2021 1723   APPEARANCEUR CLEAR 03/26/2021 1723   LABSPEC 1.020 03/26/2021 1723   PHURINE 5.5 03/26/2021 1723   GLUCOSEU >1,000 (A) 03/26/2021 1723   HGBUR NEGATIVE 03/26/2021 1723   BILIRUBINUR NEGATIVE 03/26/2021 1723   KETONESUR NEGATIVE 03/26/2021 1723   PROTEINUR NEGATIVE 03/26/2021 1723   NITRITE NEGATIVE 03/26/2021 1723   LEUKOCYTESUR NEGATIVE 03/26/2021 1723    Radiological Exams on Admission: No results found.   Assessment/Plan   Melanie Reeves is a 70 y.o. female with medical history significant for chronic diastolic heart failure, paroxysmal atrial fibrillation chronically anticoagulated on Xarelto, type 2 diabetes mellitus, who is admitted to [facility] on 03/26/2021  By way of transfer from [facility] ED after presenting from home to the left facility complaining left foot erythema.  Principal Problem:   Cellulitis of left lower extremity Active Problems:   Paroxysmal atrial fibrillation (HCC)   Hyperlipidemia    Diabetes mellitus (HCC)   Left foot pain   Chronic diastolic CHF (congestive heart failure) ([place])      #) Cellulitis of the left lower extremity: dx on basis of 4 to 5 days of progressive left erythema associated with increased warmth, tenderness, swelling, with extension from the anterior aspect of the common terminating distal to the left knee, as further detailed above.  No evidence of crepitus on exam to suggest underlying necrotizing fasciitis.  Additionally, no evidence of break in skin integrity or preceding trauma to predispose the patient to development of cellulitis, although there is some dark coloration associate with the bilateral lower extremities suggestive of chronic venous stasis changes, which would increase her risk for development of cellulitis, as well as her underlying history of diabetes.  No evidence of diabetic foot ulcers, and overall presentation appears less suggestive of osteomyelitis, although will check plain films of the left foot her presentation involving pain and swelling, without patient's absolute ability to rule out a preceding foot wound given her sensory limitations in the setting of peripheral polyneuropathy.  Relative to this baseline sensory deficit, the left lower extremity appears neurovascularly intact, without evidence of evidence of purulent drainage.  There was reported progression of the distribution of these findings in spite of 3 days of doxycycline.  Consequently, we will expand spectrum antibiotic coverage as further detailed below.   Of note, while in the presentation today to droppage ED was subsequently pates leukocytosis, no additional SIRS criteria were noted at that time.  Therefore patient does not meet criteria to be considered septic at the present time.  And serum lactate was nonelevated at 1.1.  Blood cultures x2 were collected prior to initiation of broad-spectrum antibiotics in the form of IV vancomycin, cefepime, and Flagyl while still in  the ED.  We will continue IV vancomycin and cefepime for now, while holding additional Flagyl, while acknowledging that this eliminates anaerobic coverage from the preceding antibiotic regimen.   Plan: Abx in form of IV vancomycin and cefepime, as above. Follow for results of blood cultures x2 collected today. repeat CBC with diff in AM. I have placed nursing communication orders asking that current distribution of erythema be outlined, and that affected extremity be elevated to help decrease associated swelling.  As needed Norco.  Plain films of the left foot to evaluate for osteomyelitis, check CRP and ESR.     #) Paroxysmal atrial fibrillation: Documented history of such. In the setting of a CHA2DS2-VASc score of 5, there is an indication for the patient to be on chronic anticoagulation for thromboembolic prophylaxis. Consistent with this, the patient is chronically anticoagulated on Xarelto.  Does not appear to be on any AV nodal blocking agents at home.  Most recent echocardiogram occurred in February 2022, with additional details as described above.  Appears to be in sinus rhythm at this time.    Plan: monitor strict I's & O's and daily weights. Repeat BMP and CBC in the morning. Check serum magnesium level.  Continue home Xarelto.       #) Hyperlipidemia: On fenofibrate and pravastatin as an outpatient.  Plan: Continue home statin fenofibrate.       #) Type 2 diabetes mellitus: Most recent hemoglobin A1c noted to be 6.5% on 03/19/2021.  He is complicated by peripheral polyneuropathy, for which the patient is on gabapentin.  Outpatient  insulin regimen consists of Lantus 15 units subcu daily as well as NovoLog 30 to 40 units subcu 3 times daily with meals.  Additionally she is on SGLT2 inhibitor as well as metformin.  Presenting blood sugar at droppage ED noted to be 57 per CMP, at which time the patient reports that she was asymptomatic as it relates to this objective hypoglycemia.   Consequently, we will repeat first Accu-Chek now, with hypoglycemic protocol initiated, and will hold home scheduled insulin for now, will proceed more conservatively with sliding scale insulin as advised to reduce risk for subsequent hypoglycemia.  Plan: Hold home basal and short acting scheduled insulin.  Accu-Cheks before every meal and at bedtime with low-dose sliding scale insulin.  Hold home oral hypoglycemic agents.  Continue home gabapentin.         #) Chronic diastolic heart failure: documented history of such, with most recent echocardiogram performed in February 2022 showing grade 1 diastolic dysfunction, with additional findings as noted above.  No clinical evidence to suggest acutely onset heart failure at this time.  Outpatient diuretic regimen consists of Lasix 40 units p.o.q week as opposed to q weekly.     Plan: monitor strict I's & O's and daily weights. Repeat BMP in the morning. Check serum magnesium level.         #) Allergic rhinitis: Patient reports history of seasonal allergies for which she is on scheduled loratadine in the absence of intranasal corticosteroid.  Plan: will continue loratadine.      DVT prophylaxis: Continue Xarelto Code Status: Full code Family Communication: none Disposition Plan: Per Rounding Team Consults called: none  Admission status: Inpatient; MedSurg     Of note, this patient was added by me to the following Admit List/Treatment Team: [email or web address].      PLEASE NOTE THAT DRAGON DICTATION SOFTWARE WAS USED IN THE CONSTRUCTION OF THIS NOTE.   [place] [facility] Pager [phone] From [place]  Otherwise, please contact night-coverage  [email or web address] Password [facility]   03/26/2021, 10:07 PM

## 2021-03-26 NOTE — ED Notes (Signed)
Pts. CBG obtained with result of (51 mg/dL), RN made aware, Pt. currently eating at bedside.

## 2021-03-26 NOTE — Sepsis Progress Note (Signed)
Notified bedside nurse of need to administer antibiotics.  

## 2021-03-26 NOTE — Progress Notes (Signed)
Pharmacy Antibiotic Note  Melanie Reeves is a 70 y.o. female admitted on 03/26/2021 with sepsis.  Pharmacy has been consulted for vancomycin and cefepime dosing.  Patient presented with foot swelling, also with mild leukocytosis, afebrile, stable BP and HR.  Plan: Vancomycin 2g x1 loading dose Then Vancomycin 1000mg  q12 hr Cefepime 2g IV q8 Follow up renal function, cultures and clinical symptoms.   Height: 5\' 2"  (157.5 cm) Weight: 133.4 kg (294 lb) IBW/kg (Calculated) : 50.1  Temp (24hrs), Avg:98.6 F (37 C), Min:98.6 F (37 C), Max:98.6 F (37 C)  No results for input(s): WBC, CREATININE, LATICACIDVEN, VANCOTROUGH, VANCOPEAK, VANCORANDOM, GENTTROUGH, GENTPEAK, GENTRANDOM, TOBRATROUGH, TOBRAPEAK, TOBRARND, AMIKACINPEAK, AMIKACINTROU, AMIKACIN in the last 168 hours.  Estimated Creatinine Clearance: 64.2 mL/min (by C-G formula based on SCr of 1.12 mg/dL).    Allergies  Allergen Reactions   Simvastatin     Other reaction(s): leg weakness Other reaction(s): leg weakness   Sulfa Antibiotics Other (See Comments)    Cant remember Other reaction(s): as a child    Antimicrobials this admission: Cefepime 6/14 >> Vancomycin 6/14 >> Metronidazole 6/14 >>  Dose adjustments this admission: NA  Microbiology results: 6/14 BCx: pend 6/14 UCx: pend   Thank you for allowing pharmacy to be a part of this patient's care.  7/14, PharmD PGY1 Pharmacy Resident 03/26/2021 6:03 PM

## 2021-03-26 NOTE — ED Notes (Signed)
Given report to [facility] RN to  [facility] rm [phone]

## 2021-03-26 NOTE — ED Notes (Signed)
Pt has been transported to  [facility] via [facility] - pt is stable

## 2021-03-26 NOTE — ED Notes (Signed)
RT Note: Pts. CBG obtained with result of 51

## 2021-03-26 NOTE — ED Provider Notes (Signed)
[facility] EMERGENCY DEPT Provider Note   CSN: [id number] Arrival date & time: 03/26/21  1325     History Chief Complaint  Patient presents with   Leg Swelling    Melanie Reeves is a 70 y.o. female.  HPI     70 year old female with a history of diabetes, diastolic CHF, hypertension, hyperlipidemia, obstructive sleep apnea, CKD, paroxysmal atrial fibrillation on Xarelto, who presents with concern for leg swelling.  Diagnosed as cellulitis by her PCP, reevaluated today and recommended coming to ED.  Reports she began to have redness of her left lower extremity on Saturday.  She was seen that day and started on doxycycline.  Reports she has been taking the antibiotic twice a day as prescribed, but the rash has continued to persist, and may be worsened.  Reports she does have some pain beyond the area of rash.  Reports the pain is worse when she walks on it.  She has had some fevers, chills and nausea.  Reports her last fever was yesterday and it was 100 point something.  Has not had vomiting.  She has been taking her Xarelto as prescribed.  No chest pain or shortness of breath.  Past Medical History:  Diagnosis Date   Abnormal electrocardiogram 08/14/2014   LBBB   Anemia    Anxiety    Arthritis    Atrial fibrillation (HCC)    Bilateral swelling of feet    Chest pain    Constipation    Depression    Diabetes mellitus ([place])    Difficulty walking    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   Qualifier: Diagnosis of  By: Aline Brochure MD, [place]     Dyspnea 08/14/2014   Edema    Gallbladder problem    GERD (gastroesophageal reflux disease)    Hyperlipidemia    Hypertension    LOW BACK PAIN 12/20/2009   Qualifier: Diagnosis of  By: Aline Brochure MD, [place]     Morbid obesity ([place])    OSA (obstructive sleep apnea) 08/16/2014   Osteoarthritis    Other fatigue    Palpitations    Seasonal allergies    Shortness of breath    Shortness of breath on exertion    Sinusitis    SPINAL  STENOSIS 12/20/2009   Qualifier: Diagnosis of  By: Aline Brochure MD, [place]     Stage 3 chronic kidney disease [facility])     Patient Active Problem List   Diagnosis Date Noted   Cellulitis 03/26/2021   Other fatigue 11/29/2020   Screening for depression 11/29/2020   Vitamin D deficiency 11/29/2020   B12 deficiency 11/29/2020   Shortness of breath on exertion 11/24/2020   Acute respiratory failure with hypoxia ([place]) 11/24/2020   Obesity, Class III, BMI 40-49.9 (morbid obesity) ([place]) 10/24/2020   Acute respiratory failure due to COVID-19 ([place]) 10/24/2020   Uncontrolled type 2 diabetes mellitus with hyperglycemia, with long-term current use of insulin ([place]) 10/24/2020   COVID-19 10/23/2020   Paroxysmal atrial fibrillation ([place]) 01/14/2019   Hyperlipidemia 01/14/2019   Diabetes mellitus ([place]) 01/14/2019   OSA (obstructive sleep apnea) 08/16/2014   Abnormal electrocardiogram 08/14/2014   Essential hypertension 08/14/2014   Dyspnea 08/14/2014   Obesity 08/14/2014   [place] DEGENERATION 12/20/2009   SPINAL STENOSIS 12/20/2009   LOW BACK PAIN 12/20/2009    Past Surgical History:  Procedure Laterality Date   CHOLECYSTECTOMY     COLONOSCOPY WITH PROPOFOL N/A 11/29/2014   Procedure: COLONOSCOPY WITH PROPOFOL;  Surgeon: Arta Silence, MD;  Location: [facility] ENDOSCOPY;  Service: Endoscopy;  Laterality: N/A;   SKIN CANCER EXCISION     TONSILLECTOMY       OB History     Gravida  1   Para  1   Term  1   Preterm      AB      Living         SAB      IAB      Ectopic      Multiple      Live Births              Family History  Problem Relation Age of Onset   Heart disease Father        MI   Cancer - Lung Father        Small cell Lung CA   Diabetes Father    Hypertension Father    Hyperlipidemia Father    Cancer Father    Depression Father    Anxiety disorder Father    Hypertension Mother    Hyperlipidemia Mother    Stroke Mother    Kidney disease Mother    Depression  Mother    Anxiety disorder Mother    Bipolar disorder Mother    Eating disorder Mother    Obesity Mother     Social History   Tobacco Use   Smoking status: Former    Packs/day: 2.00    Years: 20.00    Pack years: 40.00    Types: Cigarettes    Quit date: 11/22/1993    Years since quitting: 27.3   Smokeless tobacco: Former  Scientific laboratory technician Use: Never used  Substance Use Topics   Alcohol use: Yes    Alcohol/week: 0.0 standard drinks   Drug use: No    Home Medications Prior to Admission medications   Medication Sig Start Date End Date Taking? Authorizing Provider  atenolol (TENORMIN) 25 MG tablet Take 1 tablet (25 mg total) by mouth daily. 10/26/20  Yes Tat, Shanon Brow, MD  DULoxetine (CYMBALTA) 30 MG capsule TAKE 1 CAPSULE BY MOUTH DAILY. TAKE IN ADDITION TO THE 60 MG 02/01/21  Yes Elayne Snare, MD  DULoxetine (CYMBALTA) 60 MG capsule Take 60 mg by mouth daily. TAKE 1 TABLET BY MOUTH ONCE DAILY.   Yes [provider]  fenofibrate (TRICOR) 145 MG tablet Take 145 mg by mouth daily.   Yes [provider]  gabapentin (NEURONTIN) 600 MG tablet Take 1,200 mg by mouth 2 (two) times daily. Take 2 tablets ($RemoveBe'1200mg'NTKrBpAKN$  total) by mouth twice daily.   Yes [provider]  insulin aspart (NOVOLOG FLEXPEN) 100 UNIT/ML FlexPen Inject 30-40 Units into the skin 3 (three) times daily with meals.   Yes [provider]  INVOKANA 300 MG TABS tablet TAKE 1 TABLET (300 MG TOTAL) BY MOUTH DAILY BEFORE BREAKFAST. 03/14/21  Yes Elayne Snare, MD  loratadine (CLARITIN) 10 MG tablet Take 10 mg by mouth daily.   Yes [provider]  metFORMIN (GLUCOPHAGE-XR) 500 MG 24 hr tablet TAKE 4 TABLETS (2,000 MG TOTAL) BY MOUTH DAILY WITH SUPPER. 02/27/21  Yes Elayne Snare, MD  Omega-3 Fatty Acids (FISH OIL) 1000 MG CAPS Take 2 capsules by mouth daily.   Yes [provider]  potassium chloride (MICRO-K) 10 MEQ CR capsule Take 10 mEq by mouth once a week. Along with the lasix   Yes  [provider]  pravastatin (PRAVACHOL) 40 MG tablet TAKE 1 TABLET BY MOUTH EVERY DAY 02/01/21  Yes Dwyane Dee,  Ajay, MD  VICTOZA 18 MG/3ML SOPN INJECT 0.3 MLS (1.8 MG TOTAL) INTO THE SKIN DAILY. INJECT ONCE DAILY AT THE SAME TIME 11/22/20  Yes Elayne Snare, MD  Vitamin D, Ergocalciferol, (DRISDOL) 1.25 MG (50000 UNIT) CAPS capsule Take 1 capsule (50,000 Units total) by mouth every 7 (seven) days. 01/30/21  Yes Beasley, Caren D, MD  XARELTO 20 MG TABS tablet TAKE 1 TABLET (20 MG TOTAL) BY MOUTH DAILY WITH SUPPER. 03/18/21  Yes Fay Records, MD  Blood Glucose Monitoring Suppl (ONETOUCH VERIO) w/Device KIT UAD to monitor glucose tid; Dx: E11.65 07/09/18   Elayne Snare, MD  cyclobenzaprine (FLEXERIL) 5 MG tablet Take 5 mg by mouth 3 (three) times daily as needed for muscle spasms.    [provider]  furosemide (LASIX) 40 MG tablet Take 40 mg by mouth once a week.    [provider]  insulin glargine, 2 Unit Dial, (TOUJEO MAX SOLOSTAR) 300 UNIT/ML Solostar Pen Inject 15 Units into the skin daily.    [provider]  Insulin Pen Needle (B-D ULTRAFINE III SHORT PEN) 31G X 8 MM MISC Use to inject medication 5 times daily. 09/02/19   Elayne Snare, MD  [facility] ULTRA test strip USE AS INSTRUCTED TO CHECK BLOOD SUGAR THREE TIMES DAILY. 10/09/19   Elayne Snare, MD    Allergies    Simvastatin and Sulfa antibiotics  Review of Systems   Review of Systems  Constitutional:  Positive for chills and fever.  HENT:  Negative for sore throat.   Eyes:  Negative for visual disturbance.  Respiratory:  Negative for cough and shortness of breath.   Cardiovascular:  Negative for chest pain.  Gastrointestinal:  Positive for nausea. Negative for abdominal pain and vomiting.  Genitourinary:  Negative for difficulty urinating and dysuria.  Musculoskeletal:  Positive for arthralgias and myalgias. Negative for back pain and neck pain.  Skin:  Positive for rash.  Neurological:  Negative for syncope  and headaches.   Physical Exam Updated Vital Signs BP 134/75 (BP Location: Left Arm)   Pulse 77   Temp 98.1 F (36.7 C) (Oral)   Resp 18   Ht _0  (1.575 m)   Wt 133.4 kg   SpO2 97%   BMI 53.77 kg/m   Physical Exam Vitals and nursing note reviewed.  Constitutional:      General: She is not in acute distress.    Appearance: Normal appearance. She is not ill-appearing, toxic-appearing or diaphoretic.  HENT:     Head: Normocephalic.  Eyes:     Conjunctiva/sclera: Conjunctivae normal.  Cardiovascular:     Rate and Rhythm: Normal rate and regular rhythm.     Pulses: Normal pulses.  Pulmonary:     Effort: Pulmonary effort is normal. No respiratory distress.  Musculoskeletal:        General: No deformity or signs of injury.     Cervical back: No rigidity.  Skin:    General: Skin is warm and dry.     Coloration: Skin is not jaundiced or pale.     Findings: Erythema (left lower extremity from proximal to knee to foot) and rash present.  Neurological:     General: No focal deficit present.     Mental Status: She is alert and oriented to person, place, and time.    ED Results / Procedures / Treatments   Labs (all labs ordered are listed, but only abnormal results are displayed) Labs Reviewed  COMPREHENSIVE METABOLIC PANEL - Abnormal; Notable for  the following components:      Result Value   Glucose, Bld 57 (*)    BUN 28 (*)    All other components within normal limits  CBC WITH DIFFERENTIAL/PLATELET - Abnormal; Notable for the following components:   WBC 12.3 (*)    Hemoglobin 11.7 (*)    Neutro Abs 7.9 (*)    Abs Immature Granulocytes 0.16 (*)    All other components within normal limits  PROTIME-INR - Abnormal; Notable for the following components:   Prothrombin Time 16.5 (*)    INR 1.3 (*)    All other components within normal limits  APTT - Abnormal; Notable for the following components:   aPTT 46 (*)    All other components within normal limits  URINALYSIS,  ROUTINE W REFLEX MICROSCOPIC - Abnormal; Notable for the following components:   Glucose, UA >1,000 (*)    All other components within normal limits  CBG MONITORING, ED - Abnormal; Notable for the following components:   Glucose-Capillary 51 (*)    All other components within normal limits  CBG MONITORING, ED - Abnormal; Notable for the following components:   Glucose-Capillary 118 (*)    All other components within normal limits  RESP PANEL BY RT-PCR (FLU A&B, COVID) [id number]  CULTURE, BLOOD (ROUTINE X 2)  CULTURE, BLOOD (ROUTINE X 2)  URINE CULTURE  LACTIC ACID, PLASMA  LACTIC ACID, PLASMA  MAGNESIUM  COMPREHENSIVE METABOLIC PANEL  CBC WITH DIFFERENTIAL/PLATELET    EKG None  Radiology No results found.  Procedures Procedures   Medications Ordered in ED Medications  lactated ringers infusion ( Intravenous New Bag/Given 03/26/21 2018)  vancomycin (VANCOCIN) IVPB 1000 mg/200 mL premix (1,000 mg Intravenous New Bag/Given 03/26/21 1952)    Followed by  vancomycin (VANCOCIN) IVPB 1000 mg/200 mL premix (has no administration in time range)  ceFEPIme (MAXIPIME) 2 g in sodium chloride 0.9 % 100 mL IVPB (has no administration in time range)  vancomycin (VANCOCIN) IVPB 1000 mg/200 mL premix (has no administration in time range)  0.9 %  sodium chloride infusion (250 mLs Intravenous New Bag/Given 03/26/21 1815)  0.9 %  sodium chloride infusion (250 mLs Intravenous New Bag/Given 03/26/21 1813)  lactated ringers bolus 250 mL (250 mLs Intravenous New Bag/Given 03/26/21 1954)  ceFEPIme (MAXIPIME) 2 g in sodium chloride 0.9 % 100 mL IVPB (0 g Intravenous Stopped 03/26/21 1955)  metroNIDAZOLE (FLAGYL) IVPB 500 mg (0 mg Intravenous Stopped 03/26/21 1955)    ED Course  I have reviewed the triage vital signs and the nursing notes.  Pertinent labs & imaging results that were available during my care of the patient were reviewed by me and considered in my medical decision making (see chart for  details).    MDM Rules/Calculators/A&P                            70 year old female with a history of diabetes, diastolic CHF, hypertension, hyperlipidemia, obstructive sleep apnea, CKD, paroxysmal atrial fibrillation on Xarelto, who presents with concern for leg swelling.  Diagnosed as cellulitis by her PCP, reevaluated today and recommended coming to ED.  Appearance of clear demarcation of erythema and pt on xarelto and doubt DVT.  History and exam are consistent with cellulitis.  Labs show leukocytosis which is somewhat chronic, but does not show any other signs of sepsis.  However, given she is high risk for worsening infection, has had worsening infection despite oral antibiotics, describes fever at  home, will admit for continued observation and treatment of cellulitis not responsive to outpatient treatment.  Given empiric antibiotics and admitted for further care.    Final Clinical Impression(s) / ED Diagnoses Final diagnoses:  Cellulitis of left lower extremity    Rx / DC Orders ED Discharge Orders     None        Gareth Morgan, MD 03/26/21 2158

## 2021-03-26 NOTE — ED Triage Notes (Signed)
Left foot swelling since Saturday morning and was seen by her PCP this morning and was advised to come here for antibiotic therapy.

## 2021-03-27 ENCOUNTER — Encounter: Payer: PPO | Admitting: Endocrinology

## 2021-03-27 ENCOUNTER — Encounter (HOSPITAL_COMMUNITY): Payer: Self-pay | Admitting: Internal Medicine

## 2021-03-27 ENCOUNTER — Inpatient Hospital Stay (HOSPITAL_COMMUNITY): Payer: PPO

## 2021-03-27 DIAGNOSIS — I5032 Chronic diastolic (congestive) heart failure: Secondary | ICD-10-CM | POA: Diagnosis present

## 2021-03-27 DIAGNOSIS — L03116 Cellulitis of left lower limb: Secondary | ICD-10-CM | POA: Diagnosis present

## 2021-03-27 DIAGNOSIS — M79672 Pain in left foot: Secondary | ICD-10-CM | POA: Diagnosis present

## 2021-03-27 LAB — COMPREHENSIVE METABOLIC PANEL
ALT: 25 U/L (ref 0–44)
AST: 21 U/L (ref 15–41)
Albumin: 3.1 g/dL — ABNORMAL LOW (ref 3.5–5.0)
Alkaline Phosphatase: 41 U/L (ref 38–126)
Anion gap: 6 (ref 5–15)
BUN: 27 mg/dL — ABNORMAL HIGH (ref 8–23)
CO2: 24 mmol/L (ref 22–32)
Calcium: 9.3 mg/dL (ref 8.9–10.3)
Chloride: 108 mmol/L (ref 98–111)
Creatinine, Ser: 0.82 mg/dL (ref 0.44–1.00)
GFR, Estimated: >60 mL/min (ref >60)
Glucose, Bld: 94 mg/dL (ref 70–99)
Potassium: 3.6 mmol/L (ref 3.5–5.1)
Sodium: 138 mmol/L (ref 135–145)
Total Bilirubin: 0.3 mg/dL (ref 0.3–1.2)
Total Protein: 7.1 g/dL (ref 6.5–8.1)

## 2021-03-27 LAB — GLUCOSE, CAPILLARY
Glucose-Capillary: 83 mg/dL (ref 70–99)
Glucose-Capillary: 107 mg/dL — ABNORMAL HIGH (ref 70–99)
Glucose-Capillary: 121 mg/dL — ABNORMAL HIGH (ref 70–99)
Glucose-Capillary: 147 mg/dL — ABNORMAL HIGH (ref 70–99)

## 2021-03-27 LAB — SEDIMENTATION RATE: Sed Rate: 85 mm/hr — ABNORMAL HIGH (ref 0–22)

## 2021-03-27 LAB — CBC WITH DIFFERENTIAL/PLATELET
Abs Immature Granulocytes: 0.11 10*3/uL — ABNORMAL HIGH (ref 0.00–0.07)
Basophils Absolute: 0.1 10*3/uL (ref 0.0–0.1)
Basophils Relative: 0 %
Eosinophils Absolute: 0.2 10*3/uL (ref 0.0–0.5)
Eosinophils Relative: 2 %
HCT: 34.4 % — ABNORMAL LOW (ref 36.0–46.0)
Hemoglobin: 10.5 g/dL — ABNORMAL LOW (ref 12.0–15.0)
Immature Granulocytes: 1 %
Lymphocytes Relative: 26 %
Lymphs Abs: 3.2 10*3/uL (ref 0.7–4.0)
MCH: 27.2 pg (ref 26.0–34.0)
MCHC: 30.5 g/dL (ref 30.0–36.0)
MCV: 89.1 fL (ref 80.0–100.0)
Monocytes Absolute: 0.7 10*3/uL (ref 0.1–1.0)
Monocytes Relative: 6 %
Neutro Abs: 8 10*3/uL — ABNORMAL HIGH (ref 1.7–7.7)
Neutrophils Relative %: 65 %
Platelets: 300 10*3/uL (ref 150–400)
RBC: 3.86 MIL/uL — ABNORMAL LOW (ref 3.87–5.11)
RDW: 15.2 % (ref 11.5–15.5)
WBC: 12.3 10*3/uL — ABNORMAL HIGH (ref 4.0–10.5)
nRBC: 0 % (ref 0.0–0.2)

## 2021-03-27 LAB — C-REACTIVE PROTEIN: CRP: 9.3 mg/dL — ABNORMAL HIGH (ref <1.0)

## 2021-03-27 LAB — MAGNESIUM: Magnesium: 2 mg/dL (ref 1.7–2.4)

## 2021-03-27 MED ORDER — HYDROCODONE-ACETAMINOPHEN 5-325 MG PO TABS
1.0000 | ORAL_TABLET | Freq: Four times a day (QID) | ORAL | Status: DC | PRN
  Administered 2021-03-27 – 2021-04-03 (×3): 1 via ORAL
  Filled 2021-03-27 (×5): qty 1

## 2021-03-27 MED ORDER — GABAPENTIN 400 MG PO CAPS
1200.0000 mg | ORAL_CAPSULE | Freq: Two times a day (BID) | ORAL | Status: DC
  Administered 2021-03-27 – 2021-04-05 (×20): 1200 mg via ORAL
  Filled 2021-03-27 (×20): qty 3

## 2021-03-27 MED ORDER — DULOXETINE HCL 60 MG PO CPEP
60.0000 mg | ORAL_CAPSULE | Freq: Every day | ORAL | Status: DC
  Administered 2021-03-27 – 2021-04-05 (×10): 60 mg via ORAL
  Filled 2021-03-27 (×10): qty 1

## 2021-03-27 MED ORDER — CYCLOBENZAPRINE HCL 5 MG PO TABS: 5.0000 mg | ORAL_TABLET | Freq: Three times a day (TID) | ORAL | Status: DC | PRN

## 2021-03-27 MED ORDER — ONDANSETRON HCL 4 MG/2ML IJ SOLN: 4.0000 mg | Freq: Four times a day (QID) | INTRAMUSCULAR | Status: DC | PRN

## 2021-03-27 MED ORDER — LORATADINE 10 MG PO TABS
10.0000 mg | ORAL_TABLET | Freq: Every day | ORAL | Status: DC
  Administered 2021-03-27 – 2021-04-05 (×10): 10 mg via ORAL
  Filled 2021-03-27 (×10): qty 1

## 2021-03-27 MED ORDER — FENOFIBRATE 54 MG PO TABS
54.0000 mg | ORAL_TABLET | Freq: Every day | ORAL | Status: DC
  Administered 2021-03-27 – 2021-04-05 (×10): 54 mg via ORAL
  Filled 2021-03-27 (×10): qty 1

## 2021-03-27 MED ORDER — PRAVASTATIN SODIUM 20 MG PO TABS
40.0000 mg | ORAL_TABLET | Freq: Every day | ORAL | Status: DC
  Administered 2021-03-27 – 2021-04-03 (×8): 40 mg via ORAL
  Filled 2021-03-27 (×8): qty 2

## 2021-03-27 MED ORDER — DULOXETINE HCL 30 MG PO CPEP
30.0000 mg | ORAL_CAPSULE | Freq: Every day | ORAL | Status: DC
  Administered 2021-03-27 – 2021-04-04 (×9): 30 mg via ORAL
  Filled 2021-03-27 (×11): qty 1

## 2021-03-27 MED ORDER — RIVAROXABAN 10 MG PO TABS
20.0000 mg | ORAL_TABLET | Freq: Every day | ORAL | Status: DC
  Administered 2021-03-27 – 2021-04-04 (×10): 20 mg via ORAL
  Filled 2021-03-27 (×11): qty 2

## 2021-03-27 NOTE — Progress Notes (Signed)
Assessed for new iv site, unable to find one. IV team consult done.

## 2021-03-27 NOTE — Plan of Care (Signed)
?  Problem: Activity: ?Goal: Risk for activity intolerance will decrease ?Outcome: Progressing ?  ?Problem: Safety: ?Goal: Ability to remain free from injury will improve ?Outcome: Progressing ?  ?Problem: Pain Managment: ?Goal: General experience of comfort will improve ?Outcome: Progressing ?  ?

## 2021-03-27 NOTE — Progress Notes (Signed)
PROGRESS NOTE  Melanie Reeves UGQ:[id number] DOB: 1951-04-06 DOA: 03/26/2021 PCP: Ileana Ladd, MD   LOS: 1 day   Brief narrative:  Melanie Reeves is a 70 y.o. female with medical history significant for chronic diastolic heart failure, paroxysmal atrial fibrillation chronically anticoagulated on Xarelto, type 2 diabetes mellitus, was brought into the hospital with left foot redness swelling.  She was diagnosed of having left lower extremity cellulitis on 03/23/2021 and was started on doxycycline but despite that her redness swelling and tenderness had worsened so she decided come to the hospital. In the ED patient was afebrile.  Creatinine was 0.8.  WBC was elevated at 12,300. Patient was started on IV vancomycin and cefepime and Flagyl and was admitted to hospital for further evaluation and treatment.  .  Assessment/Plan:  Principal Problem:   Cellulitis of left lower extremity Active Problems:   Paroxysmal atrial fibrillation (HCC)   Hyperlipidemia   Diabetes mellitus (HCC)   Left foot pain   Chronic diastolic CHF (congestive heart failure) (HCC)   Cellulitis of the left lower extremity:  Failed outpatient treatment.  X-ray of the  foot without any osteomyelitis.  Follow blood cultures.  Continue IV antibiotics. Continue IV vancomycin and cefepime.    Paroxysmal atrial fibrillation:  Chronically on Xarelto.  Not on nodal blockers.  Appears to be in sinus rhythm at this time.    Hyperlipidemia: On fenofibrate and pravastatin as an outpatient.    Type 2 diabetes mellitus:  Recent hemoglobin A1c of 6.5.  Continue sliding scale insulin.  On metformin and SGLT 2 inhibitor as outpatient.  Long-acting insulin on hold at this time.    Chronic diastolic heart failure:  Continue diuretics.  Continue to monitor intake and output charting.     Allergic rhinitis: Continue loratadine and inhaled steroids    DVT prophylaxis: rivaroxaban (XARELTO) tablet 20 mg Start: 03/27/21  0130 rivaroxaban (XARELTO) tablet 20 mg    Code Status: Full code  Family Communication: None  Status is: Inpatient  Remains inpatient appropriate because:IV treatments appropriate due to intensity of illness or inability to take PO and Inpatient level of care appropriate due to severity of illness  Dispo: The patient is from: Home              Anticipated d/c is to: Home              Patient currently is not medically stable to d/c.   Difficult to place patient Yes  Consultants: None  Procedures: None  Anti-infectives:  Vancomycin Cefepime  Anti-infectives (From admission, onward)    Start     Dose/Rate Route Frequency Ordered Stop   03/27/21 0600  vancomycin (VANCOCIN) IVPB 1000 mg/200 mL premix        1,000 mg 200 mL/hr over 60 Minutes Intravenous Every 12 hours 03/26/21 1807     03/27/21 0200  ceFEPIme (MAXIPIME) 2 g in sodium chloride 0.9 % 100 mL IVPB        2 g 200 mL/hr over 30 Minutes Intravenous Every 8 hours 03/26/21 1742     03/26/21 1845  vancomycin (VANCOCIN) IVPB 1000 mg/200 mL premix       See Hyperspace for full Linked Orders Report.   1,000 mg 200 mL/hr over 60 Minutes Intravenous  Once 03/26/21 1739     03/26/21 1745  vancomycin (VANCOCIN) IVPB 1000 mg/200 mL premix       See Hyperspace for full Linked Orders Report.   1,000 mg 200  mL/hr over 60 Minutes Intravenous  Once 03/26/21 1739 03/26/21 2052   03/26/21 1715  ceFEPIme (MAXIPIME) 2 g in sodium chloride 0.9 % 100 mL IVPB        2 g 200 mL/hr over 30 Minutes Intravenous  Once 03/26/21 1705 03/26/21 1955   03/26/21 1715  metroNIDAZOLE (FLAGYL) IVPB 500 mg        500 mg 100 mL/hr over 60 Minutes Intravenous  Once 03/26/21 1705 03/26/21 1955   03/26/21 1715  vancomycin (VANCOCIN) IVPB 1000 mg/200 mL premix  Status:  Discontinued        1,000 mg 200 mL/hr over 60 Minutes Intravenous  Once 03/26/21 1705 03/26/21 1739       Subjective: Today, patient was seen and examined at bedside.  States  that she does have mild pain in the foot.  Denies any fever or chills  Objective: Vitals:   03/27/21 0154 03/27/21 0501  BP: (!) 113/56 104/62  Pulse: 80 72  Resp: 16 18  Temp: 98.1 F (36.7 C) 97.8 F (36.6 C)  SpO2: 99% 97%    Intake/Output Summary (Last 24 hours) at 03/27/2021 1026 Last data filed at 03/27/2021 0946 Gross per 24 hour  Intake 1066.31 ml  Output --  Net 1066.31 ml   Filed Weights   03/26/21 1342 03/27/21 0700  Weight: 133.4 kg (!) 291.4 kg   Body mass index is 116 kg/m.   Physical Exam: GENERAL: Patient is alert awake and oriented. Not in obvious distress.  Obese HENT: No scleral pallor or icterus. Pupils equally reactive to light. Oral mucosa is moist NECK: is supple, no gross swelling noted. CHEST: Clear to auscultation. No crackles or wheezes.  Diminished breath sounds bilaterally. CVS: S1 and S2 heard, no murmur. Regular rate and rhythm.  ABDOMEN: Soft, non-tender, bowel sounds are present. EXTREMITIES: Bilateral lower extremity with chronic venous changes, left lower extremity erythematous with edema and tenderness and gross cellulitis. CNS: Cranial nerves are intact. No focal motor deficits. SKIN: warm and dry, left lower extremity gross cellulitis.  Data Review: I have personally reviewed the following laboratory data and studies,  CBC: Recent Labs  Lab 03/26/21 1723 03/27/21 0310  WBC 12.3* 12.3*  NEUTROABS 7.9* 8.0*  HGB 11.7* 10.5*  HCT 38.5 34.4*  MCV 88.3 89.1  PLT 365 300   Basic Metabolic Panel: Recent Labs  Lab 03/26/21 1723 03/27/21 0310  NA 139 138  K 3.9 3.6  CL 106 108  CO2 23 24  GLUCOSE 57* 94  BUN 28* 27*  CREATININE 0.85 0.82  CALCIUM 10.0 9.3  MG  --  2.0   Liver Function Tests: Recent Labs  Lab 03/26/21 1723 03/27/21 0310  AST 21 21  ALT 27 25  ALKPHOS 47 41  BILITOT 0.3 0.3  PROT 8.0 7.1  ALBUMIN 4.0 3.1*   No results for input(s): LIPASE, AMYLASE in the last 168 hours. No results for input(s):  AMMONIA in the last 168 hours. Cardiac Enzymes: No results for input(s): CKTOTAL, CKMB, CKMBINDEX, TROPONINI in the last 168 hours. BNP (last 3 results) Recent Labs    11/24/20 0110  BNP 79.3    ProBNP (last 3 results) No results for input(s): PROBNP in the last 8760 hours.  CBG: Recent Labs  Lab 03/26/21 1718 03/26/21 1824 03/27/21 0807  GLUCAP 51* 118* 83   Recent Results (from the past 240 hour(s))  Resp Panel by RT-PCR (Flu A&B, Covid) Nasopharyngeal Swab     Status: None  Collection Time: 03/26/21  5:23 PM   Specimen: Nasopharyngeal Swab; Nasopharyngeal(NP) swabs in vial transport medium  Result Value Ref Range Status   SARS Coronavirus 2 by RT PCR NEGATIVE NEGATIVE Final    Comment: (NOTE) SARS-CoV-2 target nucleic acids are NOT DETECTED.  The SARS-CoV-2 RNA is generally detectable in upper respiratory specimens during the acute phase of infection. The lowest concentration of SARS-CoV-2 viral copies this assay can detect is 138 copies/mL. A negative result does not preclude SARS-Cov-2 infection and should not be used as the sole basis for treatment or other patient management decisions. A negative result may occur with  improper specimen collection/handling, submission of specimen other than nasopharyngeal swab, presence of viral mutation(s) within the areas targeted by this assay, and inadequate number of viral copies(<138 copies/mL). A negative result must be combined with clinical observations, patient history, and epidemiological information. The expected result is Negative.  Fact Sheet for Patients:  BloggerCourse.com  Fact Sheet for Healthcare Providers:  SeriousBroker.it  This test is no t yet approved or cleared by the [place] FDA and  has been authorized for detection and/or diagnosis of SARS-CoV-2 by FDA under an Emergency Use Authorization (EUA). This EUA will remain  in effect (meaning this  test can be used) for the duration of the COVID-19 declaration under Section 564(b)(1) of the Act, 21 U.S.C.section 360bbb-3(b)(1), unless the authorization is terminated  or revoked sooner.       Influenza A by PCR NEGATIVE NEGATIVE Final   Influenza B by PCR NEGATIVE NEGATIVE Final    Comment: (NOTE) The Xpert Xpress SARS-CoV-2/FLU/RSV plus assay is intended as an aid in the diagnosis of influenza from Nasopharyngeal swab specimens and should not be used as a sole basis for treatment. Nasal washings and aspirates are unacceptable for Xpert Xpress SARS-CoV-2/FLU/RSV testing.  Fact Sheet for Patients: BloggerCourse.com  Fact Sheet for Healthcare Providers: SeriousBroker.it  This test is not yet approved or cleared by the [place] FDA and has been authorized for detection and/or diagnosis of SARS-CoV-2 by FDA under an Emergency Use Authorization (EUA). This EUA will remain in effect (meaning this test can be used) for the duration of the COVID-19 declaration under Section 564(b)(1) of the Act, 21 U.S.C. section 360bbb-3(b)(1), unless the authorization is terminated or revoked.  Performed at [facility], [address] [place], [place] [address]   Blood Culture (routine x 2)     Status: None (Preliminary result)   Collection Time: 03/26/21  5:30 PM   Specimen: BLOOD  Result Value Ref Range Status   Specimen Description BLOOD RIGHT ANTECUBITAL  Final   Special Requests   Final    BOTTLES DRAWN AEROBIC AND ANAEROBIC Blood Culture adequate volume   Culture   Final    NO GROWTH < 12 HOURS Performed at [facility], [address], [place], [place] [address]    Report Status PENDING  Incomplete  Blood Culture (routine x 2)     Status: None (Preliminary result)   Collection Time: 03/26/21  5:58 PM   Specimen: BLOOD  Result Value Ref Range Status   Specimen Description   Final    BLOOD LEFT  ANTECUBITAL Performed at [facility], [address], [place], [place] [address]    Special Requests   Final    Blood Culture adequate volume BOTTLES DRAWN AEROBIC AND ANAEROBIC Performed at [facility], [address], [place], [place] [address]    Culture   Final  NO GROWTH < 12 HOURS Performed at [facility], [address], [place], [place] [address]    Report Status PENDING  Incomplete     Studies: DG Foot Complete Left  Result Date: 03/27/2021 CLINICAL DATA:  Left foot cellulitis, initial encounter EXAM: LEFT FOOT - COMPLETE 3+ VIEW COMPARISON:  None. FINDINGS: Considerable soft tissue swelling is noted over the dorsum of the foot in the region the metatarsals consistent with the given clinical history. Tarsal degenerative change and calcaneal spurring is noted. No acute fracture or dislocation is seen. No radiopaque foreign body is noted. IMPRESSION: Changes consistent with the given clinical history of dorsal cellulitis. No definitive findings to suggest osteomyelitis are noted. Electronically Signed   By: Alcide Clever M.D.   On: 03/27/2021 08:18      Joycelyn Das, MD  [facility] 03/27/2021  If 7PM-7AM, please contact night-coverage

## 2021-03-27 NOTE — Progress Notes (Signed)
Discharge package printed and instructions given to patient and spouse. Verbalize understanding.

## 2021-03-28 LAB — BASIC METABOLIC PANEL
Anion gap: 9 (ref 5–15)
BUN: 26 mg/dL — ABNORMAL HIGH (ref 8–23)
CO2: 24 mmol/L (ref 22–32)
Calcium: 9.8 mg/dL (ref 8.9–10.3)
Chloride: 107 mmol/L (ref 98–111)
Creatinine, Ser: 0.99 mg/dL (ref 0.44–1.00)
GFR, Estimated: >60 mL/min (ref >60)
Glucose, Bld: 118 mg/dL — ABNORMAL HIGH (ref 70–99)
Potassium: 3.7 mmol/L (ref 3.5–5.1)
Sodium: 140 mmol/L (ref 135–145)

## 2021-03-28 LAB — GLUCOSE, CAPILLARY
Glucose-Capillary: 152 mg/dL — ABNORMAL HIGH (ref 70–99)
Glucose-Capillary: 157 mg/dL — ABNORMAL HIGH (ref 70–99)
Glucose-Capillary: 184 mg/dL — ABNORMAL HIGH (ref 70–99)
Glucose-Capillary: 190 mg/dL — ABNORMAL HIGH (ref 70–99)

## 2021-03-28 LAB — CBC
HCT: 36.4 % (ref 36.0–46.0)
Hemoglobin: 11.1 g/dL — ABNORMAL LOW (ref 12.0–15.0)
MCH: 27.2 pg (ref 26.0–34.0)
MCHC: 30.5 g/dL (ref 30.0–36.0)
MCV: 89.2 fL (ref 80.0–100.0)
Platelets: 307 10*3/uL (ref 150–400)
RBC: 4.08 MIL/uL (ref 3.87–5.11)
RDW: 14.9 % (ref 11.5–15.5)
WBC: 13.1 10*3/uL — ABNORMAL HIGH (ref 4.0–10.5)
nRBC: 0 % (ref 0.0–0.2)

## 2021-03-28 LAB — URINE CULTURE: Culture: NO GROWTH

## 2021-03-28 LAB — MAGNESIUM: Magnesium: 1.9 mg/dL (ref 1.7–2.4)

## 2021-03-28 MED ORDER — CEFAZOLIN SODIUM-DEXTROSE 2-4 GM/100ML-% IV SOLN
2.0000 g | Freq: Three times a day (TID) | INTRAVENOUS | Status: DC
  Administered 2021-03-28 – 2021-04-03 (×19): 2 g via INTRAVENOUS
  Filled 2021-03-28 (×19): qty 100

## 2021-03-28 MED ORDER — INSULIN GLARGINE 100 UNIT/ML ~~LOC~~ SOLN
75.0000 [IU] | Freq: Every day | SUBCUTANEOUS | Status: DC
  Administered 2021-03-28 – 2021-03-31 (×4): 75 [IU] via SUBCUTANEOUS
  Filled 2021-03-28 (×4): qty 0.75

## 2021-03-28 NOTE — Progress Notes (Addendum)
PROGRESS NOTE  Melanie Reeves LDJ:[id number] DOB: August 10, 1951 DOA: 03/26/2021 PCP: Ileana Ladd, MD   LOS: 2 days   Brief narrative:  Melanie Reeves is a 70 y.o. female with medical history significant for chronic diastolic heart failure, paroxysmal atrial fibrillation chronically anticoagulated on Xarelto, type 2 diabetes mellitus, was brought into the hospital with left foot redness swelling.  She was diagnosed of having left lower extremity cellulitis on 03/23/2021 and was started on doxycycline but despite that her redness swelling and tenderness had worsened so she decided come to the hospital. In the ED patient was afebrile.  Creatinine was 0.8.  WBC was elevated at 12,300. Patient was started on IV vancomycin and cefepime and Flagyl and was admitted to hospital for further evaluation and treatment.  .  Assessment/Plan:  Principal Problem:   Cellulitis of left lower extremity Active Problems:   Paroxysmal atrial fibrillation (HCC)   Hyperlipidemia   Diabetes mellitus (HCC)   Left foot pain   Chronic diastolic CHF (congestive heart failure) (HCC)   Cellulitis of the left lower extremity:  Failed outpatient treatment.  X-ray of the foot without any osteomyelitis.  Follow blood cultures.  Continue IV antibiotics.  Discontinue IV vancomycin and cefepime.  Changed to Ancef.  Spoke with the pharmacy about it.  Chronic venous insufficiency.  Patient will benefit from elastic stockings on discharge.  Patient states that she has elastic stockings at home.  Advised elevation of legs whenever she is sitting down.    Paroxysmal atrial fibrillation:  Chronically on Xarelto.  Not on nodal blockers.  Appears to be in sinus rhythm at this time.    Hyperlipidemia: On fenofibrate and pravastatin as an outpatient.    Type 2 diabetes mellitus:  Recent hemoglobin A1c of 6.5.  Continue sliding scale insulin.  On metformin and SGLT 2 inhibitor as outpatient.  Takes 150 units of Lantus at home.  We  will start at half the dose while in the hospital since the blood glucose levels are controlled and is on sliding scale insulin.    Chronic diastolic heart failure:  Continue diuretics.  Continue to monitor intake and output charting.     Allergic rhinitis: Continue loratadine and inhaled steroids    DVT prophylaxis: rivaroxaban (XARELTO) tablet 20 mg Start: 03/27/21 0130 rivaroxaban (XARELTO) tablet 20 mg    Code Status: Full code  Family Communication: None  Status is: Inpatient  Remains inpatient appropriate because:IV treatments appropriate due to intensity of illness or inability to take PO and Inpatient level of care appropriate due to severity of illness  Dispo: The patient is from: Home              Anticipated d/c is to: Home likely in 1 to 2 days.              Patient currently is not medically stable to d/c.   Difficult to place patient Yes  Consultants: None  Procedures: None  Anti-infectives:  Cefazolin started 03/28/2021 Anti-infectives (From admission, onward)    Start     Dose/Rate Route Frequency Ordered Stop   03/28/21 1400  ceFAZolin (ANCEF) IVPB 2g/100 mL premix        2 g 200 mL/hr over 30 Minutes Intravenous Every 8 hours 03/28/21 0947     03/27/21 0600  vancomycin (VANCOCIN) IVPB 1000 mg/200 mL premix  Status:  Discontinued        1,000 mg 200 mL/hr over 60 Minutes Intravenous Every 12 hours 03/26/21 1807  03/28/21 0947   03/27/21 0200  ceFEPIme (MAXIPIME) 2 g in sodium chloride 0.9 % 100 mL IVPB  Status:  Discontinued        2 g 200 mL/hr over 30 Minutes Intravenous Every 8 hours 03/26/21 1742 03/28/21 0947   03/26/21 1845  vancomycin (VANCOCIN) IVPB 1000 mg/200 mL premix       See Hyperspace for full Linked Orders Report.   1,000 mg 200 mL/hr over 60 Minutes Intravenous  Once 03/26/21 1739 03/27/21 1959   03/26/21 1745  vancomycin (VANCOCIN) IVPB 1000 mg/200 mL premix       See Hyperspace for full Linked Orders Report.   1,000 mg 200 mL/hr  over 60 Minutes Intravenous  Once 03/26/21 1739 03/26/21 2052   03/26/21 1715  ceFEPIme (MAXIPIME) 2 g in sodium chloride 0.9 % 100 mL IVPB        2 g 200 mL/hr over 30 Minutes Intravenous  Once 03/26/21 1705 03/26/21 1955   03/26/21 1715  metroNIDAZOLE (FLAGYL) IVPB 500 mg        500 mg 100 mL/hr over 60 Minutes Intravenous  Once 03/26/21 1705 03/26/21 1955   03/26/21 1715  vancomycin (VANCOCIN) IVPB 1000 mg/200 mL premix  Status:  Discontinued        1,000 mg 200 mL/hr over 60 Minutes Intravenous  Once 03/26/21 1705 03/26/21 1739       Subjective: Today, patient was seen and examined at bedside.  States that she does have mild pain in the foot.  Denies any fever or chills  Objective: Vitals:   03/27/21 2146 03/28/21 0500  BP: 135/70 138/85  Pulse: 90 90  Resp: 18 16  Temp: 98.9 F (37.2 C) 98.3 F (36.8 C)  SpO2: 99% 98%    Intake/Output Summary (Last 24 hours) at 03/28/2021 1012 Last data filed at 03/27/2021 1805 Gross per 24 hour  Intake 825.52 ml  Output --  Net 825.52 ml    Filed Weights   03/26/21 1342 03/27/21 0700  Weight: 133.4 kg (!) 291.4 kg   Body mass index is 116 kg/m.   Physical Exam: General:  Average built, not in obvious distress, obese HENT:   No scleral pallor or icterus noted. Oral mucosa is moist.  Chest:  Clear breath sounds.  Diminished breath sounds bilaterally. No crackles or wheezes.  CVS: S1 &S2 heard. No murmur.  Regular rate and rhythm. Abdomen: Soft, nontender, nondistended.  Bowel sounds are heard.   Extremities: Bilateral lower extremity chronic venous changes, left lower extremity erythema edema and tenderness with cellulitis but slightly improved peripheral pulses are palpable. Psych: Alert, awake and oriented, normal mood CNS:  No cranial nerve deficits.  Power equal in all extremities.   Skin: Warm and dry.  Left lower extremity cellulitis.   Data Review: I have personally reviewed the following laboratory data and  studies,  CBC: Recent Labs  Lab 03/26/21 1723 03/27/21 0310 03/28/21 0318  WBC 12.3* 12.3* 13.1*  NEUTROABS 7.9* 8.0*  --   HGB 11.7* 10.5* 11.1*  HCT 38.5 34.4* 36.4  MCV 88.3 89.1 89.2  PLT 365 300 307    Basic Metabolic Panel: Recent Labs  Lab 03/26/21 1723 03/27/21 0310 03/28/21 0318  NA 139 138 140  K 3.9 3.6 3.7  CL 106 108 107  CO2 23 24 24   GLUCOSE 57* 94 118*  BUN 28* 27* 26*  CREATININE 0.85 0.82 0.99  CALCIUM 10.0 9.3 9.8  MG  --  2.0 1.9  Liver Function Tests: Recent Labs  Lab 03/26/21 1723 03/27/21 0310  AST 21 21  ALT 27 25  ALKPHOS 47 41  BILITOT 0.3 0.3  PROT 8.0 7.1  ALBUMIN 4.0 3.1*    No results for input(s): LIPASE, AMYLASE in the last 168 hours. No results for input(s): AMMONIA in the last 168 hours. Cardiac Enzymes: No results for input(s): CKTOTAL, CKMB, CKMBINDEX, TROPONINI in the last 168 hours. BNP (last 3 results) Recent Labs    11/24/20 0110  BNP 79.3     ProBNP (last 3 results) No results for input(s): PROBNP in the last 8760 hours.  CBG: Recent Labs  Lab 03/27/21 0807 03/27/21 1142 03/27/21 1703 03/27/21 2220 03/28/21 0728  GLUCAP 83 107* 121* 147* 152*    Recent Results (from the past 240 hour(s))  Resp Panel by RT-PCR (Flu A&B, Covid) Nasopharyngeal Swab     Status: None   Collection Time: 03/26/21  5:23 PM   Specimen: Nasopharyngeal Swab; Nasopharyngeal(NP) swabs in vial transport medium  Result Value Ref Range Status   SARS Coronavirus 2 by RT PCR NEGATIVE NEGATIVE Final    Comment: (NOTE) SARS-CoV-2 target nucleic acids are NOT DETECTED.  The SARS-CoV-2 RNA is generally detectable in upper respiratory specimens during the acute phase of infection. The lowest concentration of SARS-CoV-2 viral copies this assay can detect is 138 copies/mL. A negative result does not preclude SARS-Cov-2 infection and should not be used as the sole basis for treatment or other patient management decisions. A  negative result may occur with  improper specimen collection/handling, submission of specimen other than nasopharyngeal swab, presence of viral mutation(s) within the areas targeted by this assay, and inadequate number of viral copies(<138 copies/mL). A negative result must be combined with clinical observations, patient history, and epidemiological information. The expected result is Negative.  Fact Sheet for Patients:  BloggerCourse.com  Fact Sheet for Healthcare Providers:  SeriousBroker.it  This test is no t yet approved or cleared by the [place] FDA and  has been authorized for detection and/or diagnosis of SARS-CoV-2 by FDA under an Emergency Use Authorization (EUA). This EUA will remain  in effect (meaning this test can be used) for the duration of the COVID-19 declaration under Section 564(b)(1) of the Act, 21 U.S.C.section 360bbb-3(b)(1), unless the authorization is terminated  or revoked sooner.       Influenza A by PCR NEGATIVE NEGATIVE Final   Influenza B by PCR NEGATIVE NEGATIVE Final    Comment: (NOTE) The Xpert Xpress SARS-CoV-2/FLU/RSV plus assay is intended as an aid in the diagnosis of influenza from Nasopharyngeal swab specimens and should not be used as a sole basis for treatment. Nasal washings and aspirates are unacceptable for Xpert Xpress SARS-CoV-2/FLU/RSV testing.  Fact Sheet for Patients: BloggerCourse.com  Fact Sheet for Healthcare Providers: SeriousBroker.it  This test is not yet approved or cleared by the [place] FDA and has been authorized for detection and/or diagnosis of SARS-CoV-2 by FDA under an Emergency Use Authorization (EUA). This EUA will remain in effect (meaning this test can be used) for the duration of the COVID-19 declaration under Section 564(b)(1) of the Act, 21 U.S.C. section 360bbb-3(b)(1), unless the authorization  is terminated or revoked.  Performed at [facility], [address] [place], [place] [address]   Urine culture     Status: None   Collection Time: 03/26/21  5:23 PM   Specimen: In/Out Cath Urine  Result Value Ref Range Status   Specimen Description  Final    IN/OUT CATH URINE Performed at [facility], [address], [place], [place] [address]    Special Requests   Final    NONE Performed at [facility], [address], [place], [place] [address]    Culture   Final    NO GROWTH Performed at [facility], [address] [place], [place] [address]    Report Status 03/28/2021 FINAL  Final  Blood Culture (routine x 2)     Status: None (Preliminary result)   Collection Time: 03/26/21  5:30 PM   Specimen: BLOOD  Result Value Ref Range Status   Specimen Description BLOOD RIGHT ANTECUBITAL  Final   Special Requests   Final    BOTTLES DRAWN AEROBIC AND ANAEROBIC Blood Culture adequate volume   Culture   Final    NO GROWTH 2 DAYS Performed at [facility], [address] [place], [place] [address]    Report Status PENDING  Incomplete  Blood Culture (routine x 2)     Status: None (Preliminary result)   Collection Time: 03/26/21  5:58 PM   Specimen: BLOOD  Result Value Ref Range Status   Specimen Description   Final    BLOOD LEFT ANTECUBITAL Performed at [facility], [address], [place], [place] [address]    Special Requests   Final    Blood Culture adequate volume BOTTLES DRAWN AEROBIC AND ANAEROBIC Performed at [facility], [address], [place], [place] [address]    Culture   Final    NO GROWTH 2 DAYS Performed at [facility], [address] [place], [place] [address]    Report Status PENDING  Incomplete      Studies: DG Foot Complete Left  Result Date: 03/27/2021 CLINICAL DATA:  Left foot cellulitis, initial encounter EXAM: LEFT FOOT  - COMPLETE 3+ VIEW COMPARISON:  None. FINDINGS: Considerable soft tissue swelling is noted over the dorsum of the foot in the region the metatarsals consistent with the given clinical history. Tarsal degenerative change and calcaneal spurring is noted. No acute fracture or dislocation is seen. No radiopaque foreign body is noted. IMPRESSION: Changes consistent with the given clinical history of dorsal cellulitis. No definitive findings to suggest osteomyelitis are noted. Electronically Signed   By: Alcide CleverMark  Lukens M.D.   On: 03/27/2021 08:18      Joycelyn DasLaxman Peng Thorstenson, MD  [facility] 03/28/2021  If 7PM-7AM, please contact night-coverage

## 2021-03-28 NOTE — Progress Notes (Signed)
Pharmacy Antibiotic Note  Melanie Reeves is a 70 y.o. female admitted on 03/26/2021 with sepsis 2/2 nonpurulent cellulitis.  Pharmacy has been consulted for cefazolin dosing.  Patient presented with foot swelling, failed outpatient doxycycline which does not have adequate strep coverage. Patient previously on vanc and cefepime; deescalating to cefazolin 2g q8hr today. Pharmacy will follow renal function as well as length of therapy plans.  Plan: Stop vanc and cefepime Start cefazolin 2g q8hr Follow up renal function, cultures and clinical symptoms.   Height: 5' 2.4" (158.5 cm) Weight: (!) 291.4 kg (642 lb 6.7 oz) IBW/kg (Calculated) : 51.02  Temp (24hrs), Avg:98.6 F (37 C), Min:98.3 F (36.8 C), Max:98.9 F (37.2 C)  Recent Labs  Lab 03/26/21 1723 03/27/21 0310 03/28/21 0318  WBC 12.3* 12.3* 13.1*  CREATININE 0.85 0.82 0.99  LATICACIDVEN 1.1  0.9  --   --      Estimated Creatinine Clearance: 128.1 mL/min (by C-G formula based on SCr of 0.99 mg/dL).    Allergies  Allergen Reactions   Simvastatin     Other reaction(s): leg weakness Other reaction(s): leg weakness   Sulfa Antibiotics Other (See Comments)    Cant remember Other reaction(s): as a child    Thank you for allowing pharmacy to be a part of this patient's care.  Margarite Gouge, PharmD PGY2 ID Pharmacy Resident [phone]  03/28/2021 9:48 AM

## 2021-03-29 ENCOUNTER — Inpatient Hospital Stay (HOSPITAL_COMMUNITY): Payer: PPO

## 2021-03-29 LAB — GLUCOSE, CAPILLARY
Glucose-Capillary: 139 mg/dL — ABNORMAL HIGH (ref 70–99)
Glucose-Capillary: 169 mg/dL — ABNORMAL HIGH (ref 70–99)
Glucose-Capillary: 218 mg/dL — ABNORMAL HIGH (ref 70–99)

## 2021-03-29 LAB — MRSA PCR SCREENING: MRSA by PCR: NEGATIVE

## 2021-03-29 MED ORDER — FLUTICASONE PROPIONATE 50 MCG/ACT NA SUSP
2.0000 | Freq: Every day | NASAL | Status: DC
  Administered 2021-03-29 – 2021-04-04 (×7): 2 via NASAL
  Filled 2021-03-29: qty 16

## 2021-03-29 NOTE — Plan of Care (Signed)
Plan of care reviewed and discussed with the patient. 

## 2021-03-29 NOTE — Progress Notes (Signed)
PROGRESS NOTE  Melanie Reeves WYO:[id number] DOB: 1951/01/02 DOA: 03/26/2021 PCP: Ileana Ladd, MD   LOS: 3 days   Brief narrative:  Melanie Reeves is a 70 y.o. female with medical history significant for chronic diastolic heart failure, paroxysmal atrial fibrillation chronically anticoagulated on Xarelto, type 2 diabetes mellitus, was brought into the hospital with left foot redness swelling.  She was diagnosed of having left lower extremity cellulitis on 03/23/2021 and was started on doxycycline but despite that her redness swelling and tenderness had worsened so she decided come to the hospital. In the ED patient was afebrile.  Creatinine was 0.8.  WBC was elevated at 12,300. Patient was started on IV vancomycin and cefepime and Flagyl and was admitted to hospital for further evaluation and treatment.  .  Assessment/Plan:  Principal Problem:   Cellulitis of left lower extremity Active Problems:   Paroxysmal atrial fibrillation (HCC)   Hyperlipidemia   Diabetes mellitus (HCC)   Left foot pain   Chronic diastolic CHF (congestive heart failure) (HCC)   Cellulitis of the left lower extremity:  Failed outpatient treatment.  X-ray of the foot without any osteomyelitis.  Follow blood cultures on growth so far.   Was on  IV vancomycin and cefepime, now on Ancef.  Persistent leukocytosis, left leg improved, left foot dorsal aspect remain erythematous/edematous, will get soft tissue Doppler to rule out abscess  Chronic venous insufficiency.  Patient will benefit from elastic stockings on discharge.  Patient states that she has elastic stockings at home.  Advised elevation of legs whenever she is sitting down.        Type 2 diabetes mellitus, insulin-dependent:  Presented with hypoglycemia Recent hemoglobin A1c of 6.5.   On metformin and SGLT 2 inhibitor as outpatient.  Takes 150 units of Lantus at home.,  Currently on 75 units Lantus nightly and sliding scale A.m. fasting blood glucose  118  Hyperlipidemia: On fenofibrate and pravastatin as an outpatient.    Paroxysmal atrial fibrillation:  Chronically on Xarelto.  Not on nodal blockers.  Appears to be in sinus rhythm at this time.   Chronic diastolic heart failure:  Report on diuretics q. Weekly, last dose about 3 weeks ago.   Continue to monitor intake and output charting.     Allergic rhinitis: Continue loratadine and inhaled steroids  Class III obesity Body mass index is 51.64 kg/m.     DVT prophylaxis: rivaroxaban (XARELTO) tablet 20 mg Start: 03/27/21 0130 rivaroxaban (XARELTO) tablet 20 mg    Code Status: Full code  Family Communication: None  Status is: Inpatient  Remains inpatient appropriate because:IV treatments appropriate due to intensity of illness or inability to take PO and Inpatient level of care appropriate due to severity of illness  Dispo: The patient is from: Home              Anticipated d/c is to: Home  in 1 to 2 days, if left foot no abscess and erythema/edema improves              Patient currently is not medically stable to d/c.   Difficult to place patient Yes  Consultants: None  Procedures: None  Anti-infectives:  Cefazolin started 03/28/2021 Anti-infectives (From admission, onward)    Start     Dose/Rate Route Frequency Ordered Stop   03/28/21 1400  ceFAZolin (ANCEF) IVPB 2g/100 mL premix        2 g 200 mL/hr over 30 Minutes Intravenous Every 8 hours 03/28/21 0947  03/27/21 0600  vancomycin (VANCOCIN) IVPB 1000 mg/200 mL premix  Status:  Discontinued        1,000 mg 200 mL/hr over 60 Minutes Intravenous Every 12 hours 03/26/21 1807 03/28/21 0947   03/27/21 0200  ceFEPIme (MAXIPIME) 2 g in sodium chloride 0.9 % 100 mL IVPB  Status:  Discontinued        2 g 200 mL/hr over 30 Minutes Intravenous Every 8 hours 03/26/21 1742 03/28/21 0947   03/26/21 1845  vancomycin (VANCOCIN) IVPB 1000 mg/200 mL premix       See Hyperspace for full Linked Orders Report.   1,000  mg 200 mL/hr over 60 Minutes Intravenous  Once 03/26/21 1739 03/27/21 1959   03/26/21 1745  vancomycin (VANCOCIN) IVPB 1000 mg/200 mL premix       See Hyperspace for full Linked Orders Report.   1,000 mg 200 mL/hr over 60 Minutes Intravenous  Once 03/26/21 1739 03/26/21 2052   03/26/21 1715  ceFEPIme (MAXIPIME) 2 g in sodium chloride 0.9 % 100 mL IVPB        2 g 200 mL/hr over 30 Minutes Intravenous  Once 03/26/21 1705 03/26/21 1955   03/26/21 1715  metroNIDAZOLE (FLAGYL) IVPB 500 mg        500 mg 100 mL/hr over 60 Minutes Intravenous  Once 03/26/21 1705 03/26/21 1955   03/26/21 1715  vancomycin (VANCOCIN) IVPB 1000 mg/200 mL premix  Status:  Discontinued        1,000 mg 200 mL/hr over 60 Minutes Intravenous  Once 03/26/21 1705 03/26/21 1739       Subjective: Today, patient was seen and examined at bedside.   Left foot still very red, and swollen, left leg is better, no fever Denies any fever or chills  Objective: Vitals:   03/28/21 2151 03/29/21 0515  BP: (!) 148/95 114/86  Pulse: 84 83  Resp: 18 18  Temp: 98.2 F (36.8 C) 98.4 F (36.9 C)  SpO2: 97% 97%    Intake/Output Summary (Last 24 hours) at 03/29/2021 1242 Last data filed at 03/29/2021 1100 Gross per 24 hour  Intake 1464.54 ml  Output --  Net 1464.54 ml   Filed Weights   03/26/21 1342 03/27/21 0700 03/29/21 0600  Weight: 133.4 kg (!) 291.4 kg 129.7 kg   Body mass index is 51.64 kg/m.   Physical Exam: General:  Average built, not in obvious distress, obese HENT:   No scleral pallor or icterus noted. Oral mucosa is moist.  Chest:  Clear breath sounds.  Diminished breath sounds bilaterally. No crackles or wheezes.  CVS: S1 &S2 heard. No murmur.  Regular rate and rhythm. Abdomen: Soft, nontender, nondistended.  Bowel sounds are heard.   Extremities: Bilateral lower extremity chronic venous changes, left leg with improved erythema edema and tenderness , left foot , especially dorsal aspect remain significant  erythema /edema , she state has neuropathy , does not have significant pain . Psych: Alert, awake and oriented, normal mood CNS:  No cranial nerve deficits.  Power equal in all extremities.   Skin: Warm and dry.  Left lower extremity cellulitis.   Data Review: I have personally reviewed the following laboratory data and studies,  CBC: Recent Labs  Lab 03/26/21 1723 03/27/21 0310 03/28/21 0318  WBC 12.3* 12.3* 13.1*  NEUTROABS 7.9* 8.0*  --   HGB 11.7* 10.5* 11.1*  HCT 38.5 34.4* 36.4  MCV 88.3 89.1 89.2  PLT 365 300 307   Basic Metabolic Panel: Recent Labs  Lab  03/26/21 1723 03/27/21 0310 03/28/21 0318  NA 139 138 140  K 3.9 3.6 3.7  CL 106 108 107  CO2 23 24 24   GLUCOSE 57* 94 118*  BUN 28* 27* 26*  CREATININE 0.85 0.82 0.99  CALCIUM 10.0 9.3 9.8  MG  --  2.0 1.9   Liver Function Tests: Recent Labs  Lab 03/26/21 1723 03/27/21 0310  AST 21 21  ALT 27 25  ALKPHOS 47 41  BILITOT 0.3 0.3  PROT 8.0 7.1  ALBUMIN 4.0 3.1*   No results for input(s): LIPASE, AMYLASE in the last 168 hours. No results for input(s): AMMONIA in the last 168 hours. Cardiac Enzymes: No results for input(s): CKTOTAL, CKMB, CKMBINDEX, TROPONINI in the last 168 hours. BNP (last 3 results) Recent Labs    11/24/20 0110  BNP 79.3    ProBNP (last 3 results) No results for input(s): PROBNP in the last 8760 hours.  CBG: Recent Labs  Lab 03/28/21 1216 03/28/21 1640 03/28/21 2152 03/29/21 0719 03/29/21 1157  GLUCAP 157* 184* 190* 139* 169*   Recent Results (from the past 240 hour(s))  Resp Panel by RT-PCR (Flu A&B, Covid) Nasopharyngeal Swab     Status: None   Collection Time: 03/26/21  5:23 PM   Specimen: Nasopharyngeal Swab; Nasopharyngeal(NP) swabs in vial transport medium  Result Value Ref Range Status   SARS Coronavirus 2 by RT PCR NEGATIVE NEGATIVE Final    Comment: (NOTE) SARS-CoV-2 target nucleic acids are NOT DETECTED.  The SARS-CoV-2 RNA is generally detectable in  upper respiratory specimens during the acute phase of infection. The lowest concentration of SARS-CoV-2 viral copies this assay can detect is 138 copies/mL. A negative result does not preclude SARS-Cov-2 infection and should not be used as the sole basis for treatment or other patient management decisions. A negative result may occur with  improper specimen collection/handling, submission of specimen other than nasopharyngeal swab, presence of viral mutation(s) within the areas targeted by this assay, and inadequate number of viral copies(<138 copies/mL). A negative result must be combined with clinical observations, patient history, and epidemiological information. The expected result is Negative.  Fact Sheet for Patients:  03/28/21  Fact Sheet for Healthcare Providers:  BloggerCourse.com  This test is no t yet approved or cleared by the [employer] FDA and  has been authorized for detection and/or diagnosis of SARS-CoV-2 by FDA under an Emergency Use Authorization (EUA). This EUA will remain  in effect (meaning this test can be used) for the duration of the COVID-19 declaration under Section 564(b)(1) of the Act, 21 U.S.C.section 360bbb-3(b)(1), unless the authorization is terminated  or revoked sooner.       Influenza A by PCR NEGATIVE NEGATIVE Final   Influenza B by PCR NEGATIVE NEGATIVE Final    Comment: (NOTE) The Xpert Xpress SARS-CoV-2/FLU/RSV plus assay is intended as an aid in the diagnosis of influenza from Nasopharyngeal swab specimens and should not be used as a sole basis for treatment. Nasal washings and aspirates are unacceptable for Xpert Xpress SARS-CoV-2/FLU/RSV testing.  Fact Sheet for Patients: [place]  Fact Sheet for Healthcare Providers: BloggerCourse.com  This test is not yet approved or cleared by the [employer] FDA and has been  authorized for detection and/or diagnosis of SARS-CoV-2 by FDA under an Emergency Use Authorization (EUA). This EUA will remain in effect (meaning this test can be used) for the duration of the COVID-19 declaration under Section 564(b)(1) of the Act, 21 U.S.C. section 360bbb-3(b)(1), unless the authorization is  terminated or revoked.  Performed at [facility], [address], [place], [place] [address]   Urine culture     Status: None   Collection Time: 03/26/21  5:23 PM   Specimen: In/Out Cath Urine  Result Value Ref Range Status   Specimen Description   Final    IN/OUT CATH URINE Performed at [facility], [address], [place], [place] [address]    Special Requests   Final    NONE Performed at [facility], [address], [place], [place] [address]    Culture   Final    NO GROWTH Performed at [facility], [address] [place], [place] [address]    Report Status 03/28/2021 FINAL  Final  Blood Culture (routine x 2)     Status: None (Preliminary result)   Collection Time: 03/26/21  5:30 PM   Specimen: BLOOD  Result Value Ref Range Status   Specimen Description BLOOD RIGHT ANTECUBITAL  Final   Special Requests   Final    BOTTLES DRAWN AEROBIC AND ANAEROBIC Blood Culture adequate volume   Culture   Final    NO GROWTH 3 DAYS Performed at [facility], [address] [place], [place] [address]    Report Status PENDING  Incomplete  Blood Culture (routine x 2)     Status: None (Preliminary result)   Collection Time: 03/26/21  5:58 PM   Specimen: BLOOD  Result Value Ref Range Status   Specimen Description   Final    BLOOD LEFT ANTECUBITAL Performed at [facility], [address], [place], [place] [address]    Special Requests   Final    Blood Culture adequate volume BOTTLES DRAWN AEROBIC AND ANAEROBIC Performed at [facility], [address],  [place], [place] [address]    Culture   Final    NO GROWTH 3 DAYS Performed at [facility], [address] [place], [place] [address]    Report Status PENDING  Incomplete     Studies: No results found.    Albertine GratesFang Braylee Lal, MD PhD FACP [facility] 03/29/2021  If 7PM-7AM, please contact night-coverage

## 2021-03-30 LAB — CBC WITH DIFFERENTIAL/PLATELET
Abs Immature Granulocytes: 0.2 10*3/uL — ABNORMAL HIGH (ref 0.00–0.07)
Basophils Absolute: 0.1 10*3/uL (ref 0.0–0.1)
Basophils Relative: 1 %
Eosinophils Absolute: 0.3 10*3/uL (ref 0.0–0.5)
Eosinophils Relative: 2 %
HCT: 36.2 % (ref 36.0–46.0)
Hemoglobin: 10.9 g/dL — ABNORMAL LOW (ref 12.0–15.0)
Immature Granulocytes: 2 %
Lymphocytes Relative: 32 %
Lymphs Abs: 3.7 10*3/uL (ref 0.7–4.0)
MCH: 27 pg (ref 26.0–34.0)
MCHC: 30.1 g/dL (ref 30.0–36.0)
MCV: 89.6 fL (ref 80.0–100.0)
Monocytes Absolute: 0.7 10*3/uL (ref 0.1–1.0)
Monocytes Relative: 6 %
Neutro Abs: 6.6 10*3/uL (ref 1.7–7.7)
Neutrophils Relative %: 57 %
Platelets: 377 10*3/uL (ref 150–400)
RBC: 4.04 MIL/uL (ref 3.87–5.11)
RDW: 14.9 % (ref 11.5–15.5)
WBC: 11.5 10*3/uL — ABNORMAL HIGH (ref 4.0–10.5)
nRBC: 0 % (ref 0.0–0.2)

## 2021-03-30 LAB — BASIC METABOLIC PANEL
Anion gap: 7 (ref 5–15)
BUN: 22 mg/dL (ref 8–23)
CO2: 28 mmol/L (ref 22–32)
Calcium: 9.3 mg/dL (ref 8.9–10.3)
Chloride: 103 mmol/L (ref 98–111)
Creatinine, Ser: 1.03 mg/dL — ABNORMAL HIGH (ref 0.44–1.00)
GFR, Estimated: 60 mL/min — ABNORMAL LOW (ref >60)
Glucose, Bld: 225 mg/dL — ABNORMAL HIGH (ref 70–99)
Potassium: 3.6 mmol/L (ref 3.5–5.1)
Sodium: 138 mmol/L (ref 135–145)

## 2021-03-30 LAB — GLUCOSE, CAPILLARY
Glucose-Capillary: 165 mg/dL — ABNORMAL HIGH (ref 70–99)
Glucose-Capillary: 172 mg/dL — ABNORMAL HIGH (ref 70–99)
Glucose-Capillary: 334 mg/dL — ABNORMAL HIGH (ref 70–99)
Glucose-Capillary: 233 mg/dL — ABNORMAL HIGH (ref 70–99)

## 2021-03-30 LAB — SEDIMENTATION RATE: Sed Rate: 75 mm/hr — ABNORMAL HIGH (ref 0–22)

## 2021-03-30 LAB — C-REACTIVE PROTEIN: CRP: 6.3 mg/dL — ABNORMAL HIGH (ref <1.0)

## 2021-03-30 MED ORDER — FUROSEMIDE 10 MG/ML IJ SOLN
60.0000 mg | Freq: Once | INTRAMUSCULAR | Status: AC
  Administered 2021-03-30: 60 mg via INTRAVENOUS
  Filled 2021-03-30: qty 6

## 2021-03-30 NOTE — Plan of Care (Signed)
  Problem: Activity: Goal: Risk for activity intolerance will decrease Outcome: Progressing   Problem: Nutrition: Goal: Adequate nutrition will be maintained Outcome: Progressing   

## 2021-03-30 NOTE — Plan of Care (Signed)
  Problem: Clinical Measurements: Goal: Respiratory complications will improve Outcome: Progressing   Problem: Clinical Measurements: Goal: Cardiovascular complication will be avoided Outcome: Progressing   Problem: Nutrition: Goal: Adequate nutrition will be maintained Outcome: Progressing   Problem: Coping: Goal: Level of anxiety will decrease Outcome: Progressing   Problem: Elimination: Goal: Will not experience complications related to bowel motility Outcome: Progressing   

## 2021-03-30 NOTE — Progress Notes (Signed)
Patient ID: Melanie Reeves, female   DOB: 09/18/1951, 70 y.o.   MRN: [id number]  PROGRESS NOTE    Soundra PilonWanda M Mckimmy  JXB:[id number] DOB: 07/10/1951 DOA: 03/26/2021 PCP: Ileana LaddWong, Francis P, MD   Brief Narrative:  70 y.o. female with medical history significant for chronic diastolic heart failure, paroxysmal atrial fibrillation chronically anticoagulated on Xarelto, type 2 diabetes mellitus presented with worsening left lower extremity swelling and redness despite being treated with oral doxycycline as an outpatient.  On presentation, she had leukocytosis.  She was started on broad-spectrum antibiotics for cellulitis.  Assessment & Plan:   Left lower extremity cellulitis -Failed outpatient treatment.  X-ray of the foot did not show any osteomyelitis.  Cultures negative so far -Initially started on vancomycin and cefepime which were switched to IV Ancef. -Erythema improving but still has significant erythema and some warmth on the foot.  Continue Ancef.  No evidence of abscess or fluid collection on soft tissue ultrasound done on 03/29/2021  Chronic venous insufficiency -Resume elastic stockings at home. -Advised elevation of legs whenever she is sitting down.  Diabetes mellitus type 2 with hyperglycemia -Continue Lantus along with CBGs with SSI  Paroxysmal A. fib -Currently rate controlled.  Continue Xarelto  Chronic diastolic heart failure -Strict input and output.  Daily weights.  Fluid restriction.  Reportedly on diuretics q. weekly.  Allergic rhinitis -Continue loratadine  Anxiety/depression --continue duloxetine  Dyslipidemia -Continue fenofibrate and pravastatin  DVT prophylaxis: Xarelto Code Status: Full Family Communication: None at bedside Disposition Plan: Status is: Inpatient  Remains inpatient appropriate because:Inpatient level of care appropriate due to severity of illness  Dispo: The patient is from: Home              Anticipated d/c is to: Home              Patient  currently is not medically stable to d/c.   Difficult to place patient No  Consultants: None  Procedures: None  Antimicrobials:  Anti-infectives (From admission, onward)    Start     Dose/Rate Route Frequency Ordered Stop   03/28/21 1400  ceFAZolin (ANCEF) IVPB 2g/100 mL premix        2 g 200 mL/hr over 30 Minutes Intravenous Every 8 hours 03/28/21 0947     03/27/21 0600  vancomycin (VANCOCIN) IVPB 1000 mg/200 mL premix  Status:  Discontinued        1,000 mg 200 mL/hr over 60 Minutes Intravenous Every 12 hours 03/26/21 1807 03/28/21 0947   03/27/21 0200  ceFEPIme (MAXIPIME) 2 g in sodium chloride 0.9 % 100 mL IVPB  Status:  Discontinued        2 g 200 mL/hr over 30 Minutes Intravenous Every 8 hours 03/26/21 1742 03/28/21 0947   03/26/21 1845  vancomycin (VANCOCIN) IVPB 1000 mg/200 mL premix       See Hyperspace for full Linked Orders Report.   1,000 mg 200 mL/hr over 60 Minutes Intravenous  Once 03/26/21 1739 03/27/21 1959   03/26/21 1745  vancomycin (VANCOCIN) IVPB 1000 mg/200 mL premix       See Hyperspace for full Linked Orders Report.   1,000 mg 200 mL/hr over 60 Minutes Intravenous  Once 03/26/21 1739 03/26/21 2052   03/26/21 1715  ceFEPIme (MAXIPIME) 2 g in sodium chloride 0.9 % 100 mL IVPB        2 g 200 mL/hr over 30 Minutes Intravenous  Once 03/26/21 1705 03/26/21 1955   03/26/21 1715  metroNIDAZOLE (FLAGYL) IVPB 500  mg        500 mg 100 mL/hr over 60 Minutes Intravenous  Once 03/26/21 1705 03/26/21 1955   03/26/21 1715  vancomycin (VANCOCIN) IVPB 1000 mg/200 mL premix  Status:  Discontinued        1,000 mg 200 mL/hr over 60 Minutes Intravenous  Once 03/26/21 1705 03/26/21 1739        Subjective: Patient seen and examined at bedside.  Feels slightly better but still complains of left foot redness and swelling.  Pain is improving.  No overnight fever or vomiting reported.  Objective: Vitals:   03/29/21 0600 03/29/21 1423 03/29/21 2137 03/30/21 0520  BP:  137/73  140/86 (!) 155/69  Pulse:  93 91 84  Resp:  16 16 16   Temp:  98.2 F (36.8 C) 98.2 F (36.8 C) 97.8 F (36.6 C)  TempSrc:   Oral Oral  SpO2:  95% 96% 99%  Weight: 129.7 kg   128.9 kg  Height:        Intake/Output Summary (Last 24 hours) at 03/30/2021 1149 Last data filed at 03/30/2021 1020 Gross per 24 hour  Intake 1500 ml  Output --  Net 1500 ml   Filed Weights   03/27/21 0700 03/29/21 0600 03/30/21 0520  Weight: (!) 291.4 kg 129.7 kg 128.9 kg    Examination:  General exam: Appears calm and comfortable.  Currently on room air. Respiratory system: Bilateral decreased breath sounds at bases with some scattered crackles Cardiovascular system: S1 & S2 heard, Rate controlled Gastrointestinal system: Abdomen is morbidly obese, nondistended, soft and nontender. Normal bowel sounds heard. Extremities: No cyanosis, clubbing; bilateral lower extremity edema present Central nervous system: Alert and oriented. No focal neurological deficits. Moving extremities Skin: Left foot erythema, tenderness and warmth present.  No obvious wound/discharge  psychiatry: Judgement and insight appear normal. Mood & affect appropriate.     Data Reviewed: I have personally reviewed following labs and imaging studies  CBC: Recent Labs  Lab 03/26/21 1723 03/27/21 0310 03/28/21 0318 03/30/21 0331  WBC 12.3* 12.3* 13.1* 11.5*  NEUTROABS 7.9* 8.0*  --  6.6  HGB 11.7* 10.5* 11.1* 10.9*  HCT 38.5 34.4* 36.4 36.2  MCV 88.3 89.1 89.2 89.6  PLT 365 300 307 377   Basic Metabolic Panel: Recent Labs  Lab 03/26/21 1723 03/27/21 0310 03/28/21 0318 03/30/21 0331  NA 139 138 140 138  K 3.9 3.6 3.7 3.6  CL 106 108 107 103  CO2 23 24 24 28   GLUCOSE 57* 94 118* 225*  BUN 28* 27* 26* 22  CREATININE 0.85 0.82 0.99 1.03*  CALCIUM 10.0 9.3 9.8 9.3  MG  --  2.0 1.9  --    GFR: Estimated Creatinine Clearance: 68.8 mL/min (A) (by C-G formula based on SCr of 1.03 mg/dL (H)). Liver Function  Tests: Recent Labs  Lab 03/26/21 1723 03/27/21 0310  AST 21 21  ALT 27 25  ALKPHOS 47 41  BILITOT 0.3 0.3  PROT 8.0 7.1  ALBUMIN 4.0 3.1*   No results for input(s): LIPASE, AMYLASE in the last 168 hours. No results for input(s): AMMONIA in the last 168 hours. Coagulation Profile: Recent Labs  Lab 03/26/21 1723  INR 1.3*   Cardiac Enzymes: No results for input(s): CKTOTAL, CKMB, CKMBINDEX, TROPONINI in the last 168 hours. BNP (last 3 results) No results for input(s): PROBNP in the last 8760 hours. HbA1C: No results for input(s): HGBA1C in the last 72 hours. CBG: Recent Labs  Lab 03/29/21 0719 03/29/21 1157  03/29/21 1630 03/30/21 0733 03/30/21 1125  GLUCAP 139* 169* 218* 165* 172*   Lipid Profile: No results for input(s): CHOL, HDL, LDLCALC, TRIG, CHOLHDL, LDLDIRECT in the last 72 hours. Thyroid Function Tests: No results for input(s): TSH, T4TOTAL, FREET4, T3FREE, THYROIDAB in the last 72 hours. Anemia Panel: No results for input(s): VITAMINB12, FOLATE, FERRITIN, TIBC, IRON, RETICCTPCT in the last 72 hours. Sepsis Labs: Recent Labs  Lab 03/26/21 1723  LATICACIDVEN 1.1  0.9    Recent Results (from the past 240 hour(s))  Resp Panel by RT-PCR (Flu A&B, Covid) Nasopharyngeal Swab     Status: None   Collection Time: 03/26/21  5:23 PM   Specimen: Nasopharyngeal Swab; Nasopharyngeal(NP) swabs in vial transport medium  Result Value Ref Range Status   SARS Coronavirus 2 by RT PCR NEGATIVE NEGATIVE Final    Comment: (NOTE) SARS-CoV-2 target nucleic acids are NOT DETECTED.  The SARS-CoV-2 RNA is generally detectable in upper respiratory specimens during the acute phase of infection. The lowest concentration of SARS-CoV-2 viral copies this assay can detect is 138 copies/mL. A negative result does not preclude SARS-Cov-2 infection and should not be used as the sole basis for treatment or other patient management decisions. A negative result may occur with  improper  specimen collection/handling, submission of specimen other than nasopharyngeal swab, presence of viral mutation(s) within the areas targeted by this assay, and inadequate number of viral copies(<138 copies/mL). A negative result must be combined with clinical observations, patient history, and epidemiological information. The expected result is Negative.  Fact Sheet for Patients:  BloggerCourse.com  Fact Sheet for Healthcare Providers:  SeriousBroker.it  This test is no t yet approved or cleared by the [place] FDA and  has been authorized for detection and/or diagnosis of SARS-CoV-2 by FDA under an Emergency Use Authorization (EUA). This EUA will remain  in effect (meaning this test can be used) for the duration of the COVID-19 declaration under Section 564(b)(1) of the Act, 21 U.S.C.section 360bbb-3(b)(1), unless the authorization is terminated  or revoked sooner.       Influenza A by PCR NEGATIVE NEGATIVE Final   Influenza B by PCR NEGATIVE NEGATIVE Final    Comment: (NOTE) The Xpert Xpress SARS-CoV-2/FLU/RSV plus assay is intended as an aid in the diagnosis of influenza from Nasopharyngeal swab specimens and should not be used as a sole basis for treatment. Nasal washings and aspirates are unacceptable for Xpert Xpress SARS-CoV-2/FLU/RSV testing.  Fact Sheet for Patients: BloggerCourse.com  Fact Sheet for Healthcare Providers: SeriousBroker.it  This test is not yet approved or cleared by the [place] FDA and has been authorized for detection and/or diagnosis of SARS-CoV-2 by FDA under an Emergency Use Authorization (EUA). This EUA will remain in effect (meaning this test can be used) for the duration of the COVID-19 declaration under Section 564(b)(1) of the Act, 21 U.S.C. section 360bbb-3(b)(1), unless the authorization is terminated or revoked.  Performed at  [facility], [address], [place], [place] [address]   Urine culture     Status: None   Collection Time: 03/26/21  5:23 PM   Specimen: In/Out Cath Urine  Result Value Ref Range Status   Specimen Description   Final    IN/OUT CATH URINE Performed at [facility], [address] [place], [place] [address]    Special Requests   Final    NONE Performed at [facility], [address] [place], [place] [address]    Culture   Final  NO GROWTH Performed at [facility], [address], [place], [place] [address]    Report Status 03/28/2021 FINAL  Final  Blood Culture (routine x 2)     Status: None (Preliminary result)   Collection Time: 03/26/21  5:30 PM   Specimen: BLOOD  Result Value Ref Range Status   Specimen Description BLOOD RIGHT ANTECUBITAL  Final   Special Requests   Final    BOTTLES DRAWN AEROBIC AND ANAEROBIC Blood Culture adequate volume   Culture   Final    NO GROWTH 3 DAYS Performed at [facility], [address], [place], [place] [address]    Report Status PENDING  Incomplete  Blood Culture (routine x 2)     Status: None (Preliminary result)   Collection Time: 03/26/21  5:58 PM   Specimen: BLOOD  Result Value Ref Range Status   Specimen Description   Final    BLOOD LEFT ANTECUBITAL Performed at [facility], [address], [place], [place] [address]    Special Requests   Final    Blood Culture adequate volume BOTTLES DRAWN AEROBIC AND ANAEROBIC Performed at [facility], [address], [place], [place] [address]    Culture   Final    NO GROWTH 3 DAYS Performed at [facility], [address], [place], [place] [address]    Report Status PENDING  Incomplete  MRSA PCR Screening     Status: None   Collection Time: 03/29/21 10:00 PM  Result Value Ref Range Status   MRSA by PCR NEGATIVE NEGATIVE Final    Comment:        The GeneXpert  MRSA Assay (FDA approved for NASAL specimens only), is one component of a comprehensive MRSA colonization surveillance program. It is not intended to diagnose MRSA infection nor to guide or monitor treatment for MRSA infections. Performed at [facility], [address] [place], [place] [address]          Radiology Studies: [place] LT LOWER EXTREM LTD SOFT TISSUE NON VASCULAR  Result Date: 03/29/2021 CLINICAL DATA:  Left foot cellulitis.  Rule out abscess. EXAM: ULTRASOUND LEFT LOWER EXTREMITY LIMITED TECHNIQUE: Ultrasound examination of the lower extremity soft tissues was performed in the area of clinical concern. COMPARISON:  Foot radiograph 03/27/2021 FINDINGS: Targeted sonographic evaluation in the area of clinical concern labeled left foot dorsal aspect. There is diffuse and prominent subcutaneous edema but no focal fluid collection. Mild scattered soft tissue vascularity without any peripherally enhancing foci. No sonographic evidence of radiopaque foreign body. IMPRESSION: Prominent subcutaneous edema without sonographic evidence of abscess or focal fluid collection. Electronically Signed   By: Narda Rutherford M.D.   On: 03/29/2021 18:37        Scheduled Meds:  DULoxetine  30 mg Oral Daily   DULoxetine  60 mg Oral Daily   fenofibrate  54 mg Oral Daily   fluticasone  2 spray Each Nare Daily   gabapentin  1,200 mg Oral BID   insulin aspart  0-9 Units Subcutaneous TID WC   insulin glargine  75 Units Subcutaneous QHS   loratadine  10 mg Oral Daily   pravastatin  40 mg Oral Daily   rivaroxaban  20 mg Oral Q supper   Continuous Infusions:  sodium chloride 10 mL/hr at 03/27/21 1059    ceFAZolin (ANCEF) IV 2 g (03/30/21 0510)          Glade Lloyd, MD [facility] 03/30/2021, 11:49 AM

## 2021-03-31 LAB — CULTURE, BLOOD (ROUTINE X 2)
Culture: NO GROWTH
Culture: NO GROWTH
Special Requests: ADEQUATE
Special Requests: ADEQUATE

## 2021-03-31 LAB — GLUCOSE, CAPILLARY
Glucose-Capillary: 158 mg/dL — ABNORMAL HIGH (ref 70–99)
Glucose-Capillary: 222 mg/dL — ABNORMAL HIGH (ref 70–99)
Glucose-Capillary: 310 mg/dL — ABNORMAL HIGH (ref 70–99)
Glucose-Capillary: 328 mg/dL — ABNORMAL HIGH (ref 70–99)

## 2021-03-31 MED ORDER — FUROSEMIDE 10 MG/ML IJ SOLN
60.0000 mg | Freq: Once | INTRAMUSCULAR | Status: AC
  Administered 2021-03-31: 60 mg via INTRAVENOUS
  Filled 2021-03-31: qty 6

## 2021-03-31 NOTE — Progress Notes (Signed)
Patient ID: Melanie Reeves, female   DOB: 01/15/1951, 70 y.o.   MRN: [id number]  PROGRESS NOTE    Soundra PilonWanda M Kynard  WUJ:[id number] DOB: 07/17/1951 DOA: 03/26/2021 PCP: Ileana LaddWong, Francis P, MD   Brief Narrative:  70 y.o. female with medical history significant for chronic diastolic heart failure, paroxysmal atrial fibrillation chronically anticoagulated on Xarelto, type 2 diabetes mellitus presented with worsening left lower extremity swelling and redness despite being treated with oral doxycycline as an outpatient.  On presentation, she had leukocytosis.  She was started on broad-spectrum antibiotics for cellulitis.  Assessment & Plan:   Left lower extremity cellulitis -Failed outpatient treatment.  X-ray of the foot did not show any osteomyelitis.  Cultures negative so far -Initially started on vancomycin and cefepime which were switched to IV Ancef. -Erythema improving but still has significant erythema and some warmth on the foot.  Continue Ancef for today as well.  No evidence of abscess or fluid collection on soft tissue ultrasound done on 03/29/2021  Chronic venous insufficiency -Resume elastic stockings at home. -Advised elevation of legs whenever she is sitting down.  Diabetes mellitus type 2 with hyperglycemia -Continue Lantus along with CBGs with SSI  Paroxysmal A. fib -Currently rate controlled.  Continue Xarelto  Chronic diastolic heart failure -Strict input and output.  Daily weights.  Fluid restriction.  Reportedly on diuretics q. weekly. -Received a dose of IV Lasix on 03/30/2021.  Will give 1 more dose of IV Lasix today.  Allergic rhinitis -Continue loratadine  Anxiety/depression --continue duloxetine  Dyslipidemia -Continue fenofibrate and pravastatin  DVT prophylaxis: Xarelto Code Status: Full Family Communication: None at bedside Disposition Plan: Status is: Inpatient  Remains inpatient appropriate because:Inpatient level of care appropriate due to severity of  illness  Dispo: The patient is from: Home              Anticipated d/c is to: Home              Patient currently is not medically stable to d/c.   Difficult to place patient No  Consultants: None  Procedures: None  Antimicrobials:  Anti-infectives (From admission, onward)    Start     Dose/Rate Route Frequency Ordered Stop   03/28/21 1400  ceFAZolin (ANCEF) IVPB 2g/100 mL premix        2 g 200 mL/hr over 30 Minutes Intravenous Every 8 hours 03/28/21 0947     03/27/21 0600  vancomycin (VANCOCIN) IVPB 1000 mg/200 mL premix  Status:  Discontinued        1,000 mg 200 mL/hr over 60 Minutes Intravenous Every 12 hours 03/26/21 1807 03/28/21 0947   03/27/21 0200  ceFEPIme (MAXIPIME) 2 g in sodium chloride 0.9 % 100 mL IVPB  Status:  Discontinued        2 g 200 mL/hr over 30 Minutes Intravenous Every 8 hours 03/26/21 1742 03/28/21 0947   03/26/21 1845  vancomycin (VANCOCIN) IVPB 1000 mg/200 mL premix       See Hyperspace for full Linked Orders Report.   1,000 mg 200 mL/hr over 60 Minutes Intravenous  Once 03/26/21 1739 03/27/21 1959   03/26/21 1745  vancomycin (VANCOCIN) IVPB 1000 mg/200 mL premix       See Hyperspace for full Linked Orders Report.   1,000 mg 200 mL/hr over 60 Minutes Intravenous  Once 03/26/21 1739 03/26/21 2052   03/26/21 1715  ceFEPIme (MAXIPIME) 2 g in sodium chloride 0.9 % 100 mL IVPB        2  g 200 mL/hr over 30 Minutes Intravenous  Once 03/26/21 1705 03/26/21 1955   03/26/21 1715  metroNIDAZOLE (FLAGYL) IVPB 500 mg        500 mg 100 mL/hr over 60 Minutes Intravenous  Once 03/26/21 1705 03/26/21 1955   03/26/21 1715  vancomycin (VANCOCIN) IVPB 1000 mg/200 mL premix  Status:  Discontinued        1,000 mg 200 mL/hr over 60 Minutes Intravenous  Once 03/26/21 1705 03/26/21 1739        Subjective: Patient seen and examined at bedside.  Still complains of swollen left foot with redness and some pain.  No overnight fever, vomiting or worsening shortness of  breath reported.  Objective: Vitals:   03/30/21 2028 03/31/21 0600 03/31/21 0620 03/31/21 0620  BP: (!) 142/64   124/60  Pulse: 83   78  Resp: 18   16  Temp: 98.5 F (36.9 C)  97.8 F (36.6 C)   TempSrc: Oral  Oral   SpO2: 98%   97%  Weight:  127 kg    Height:        Intake/Output Summary (Last 24 hours) at 03/31/2021 1042 Last data filed at 03/31/2021 0851 Gross per 24 hour  Intake 1620 ml  Output 3700 ml  Net -2080 ml    Filed Weights   03/29/21 0600 03/30/21 0520 03/31/21 0600  Weight: 129.7 kg 128.9 kg 127 kg    Examination:  General exam: On room air currently.  No distress. Respiratory system: Decreased breath sounds at bases bilaterally with some crackles. Cardiovascular system: Rate controlled, S1-S2 heard gastrointestinal system: Abdomen is morbidly obese, slightly distended, soft and nontender.  Bowel sounds are heard. Extremities: Lower extremity edema present bilaterally; no cyanosis  Central nervous system: Awake and alert.  No focal neurological deficits.  Moves extremities  skin: Left foot erythema, tenderness and warmth still present with not much improvement as compared to yesterday.  No obvious wound/discharge  psychiatry: Mood, affect and judgment appear to be normal   Data Reviewed: I have personally reviewed following labs and imaging studies  CBC: Recent Labs  Lab 03/26/21 1723 03/27/21 0310 03/28/21 0318 03/30/21 0331  WBC 12.3* 12.3* 13.1* 11.5*  NEUTROABS 7.9* 8.0*  --  6.6  HGB 11.7* 10.5* 11.1* 10.9*  HCT 38.5 34.4* 36.4 36.2  MCV 88.3 89.1 89.2 89.6  PLT 365 300 307 377    Basic Metabolic Panel: Recent Labs  Lab 03/26/21 1723 03/27/21 0310 03/28/21 0318 03/30/21 0331  NA 139 138 140 138  K 3.9 3.6 3.7 3.6  CL 106 108 107 103  CO2 23 24 24 28   GLUCOSE 57* 94 118* 225*  BUN 28* 27* 26* 22  CREATININE 0.85 0.82 0.99 1.03*  CALCIUM 10.0 9.3 9.8 9.3  MG  --  2.0 1.9  --     GFR: Estimated Creatinine Clearance: 68.1  mL/min (A) (by C-G formula based on SCr of 1.03 mg/dL (H)). Liver Function Tests: Recent Labs  Lab 03/26/21 1723 03/27/21 0310  AST 21 21  ALT 27 25  ALKPHOS 47 41  BILITOT 0.3 0.3  PROT 8.0 7.1  ALBUMIN 4.0 3.1*    No results for input(s): LIPASE, AMYLASE in the last 168 hours. No results for input(s): AMMONIA in the last 168 hours. Coagulation Profile: Recent Labs  Lab 03/26/21 1723  INR 1.3*    Cardiac Enzymes: No results for input(s): CKTOTAL, CKMB, CKMBINDEX, TROPONINI in the last 168 hours. BNP (last 3 results) No results  for input(s): PROBNP in the last 8760 hours. HbA1C: No results for input(s): HGBA1C in the last 72 hours. CBG: Recent Labs  Lab 03/30/21 0733 03/30/21 1125 03/30/21 1608 03/30/21 2124 03/31/21 0732  GLUCAP 165* 172* 334* 233* 158*    Lipid Profile: No results for input(s): CHOL, HDL, LDLCALC, TRIG, CHOLHDL, LDLDIRECT in the last 72 hours. Thyroid Function Tests: No results for input(s): TSH, T4TOTAL, FREET4, T3FREE, THYROIDAB in the last 72 hours. Anemia Panel: No results for input(s): VITAMINB12, FOLATE, FERRITIN, TIBC, IRON, RETICCTPCT in the last 72 hours. Sepsis Labs: Recent Labs  Lab 03/26/21 1723  LATICACIDVEN 1.1  0.9     Recent Results (from the past 240 hour(s))  Resp Panel by RT-PCR (Flu A&B, Covid) Nasopharyngeal Swab     Status: None   Collection Time: 03/26/21  5:23 PM   Specimen: Nasopharyngeal Swab; Nasopharyngeal(NP) swabs in vial transport medium  Result Value Ref Range Status   SARS Coronavirus 2 by RT PCR NEGATIVE NEGATIVE Final    Comment: (NOTE) SARS-CoV-2 target nucleic acids are NOT DETECTED.  The SARS-CoV-2 RNA is generally detectable in upper respiratory specimens during the acute phase of infection. The lowest concentration of SARS-CoV-2 viral copies this assay can detect is 138 copies/mL. A negative result does not preclude SARS-Cov-2 infection and should not be used as the sole basis for treatment  or other patient management decisions. A negative result may occur with  improper specimen collection/handling, submission of specimen other than nasopharyngeal swab, presence of viral mutation(s) within the areas targeted by this assay, and inadequate number of viral copies(<138 copies/mL). A negative result must be combined with clinical observations, patient history, and epidemiological information. The expected result is Negative.  Fact Sheet for Patients:  BloggerCourse.com  Fact Sheet for Healthcare Providers:  SeriousBroker.it  This test is no t yet approved or cleared by the [place] FDA and  has been authorized for detection and/or diagnosis of SARS-CoV-2 by FDA under an Emergency Use Authorization (EUA). This EUA will remain  in effect (meaning this test can be used) for the duration of the COVID-19 declaration under Section 564(b)(1) of the Act, 21 U.S.C.section 360bbb-3(b)(1), unless the authorization is terminated  or revoked sooner.       Influenza A by PCR NEGATIVE NEGATIVE Final   Influenza B by PCR NEGATIVE NEGATIVE Final    Comment: (NOTE) The Xpert Xpress SARS-CoV-2/FLU/RSV plus assay is intended as an aid in the diagnosis of influenza from Nasopharyngeal swab specimens and should not be used as a sole basis for treatment. Nasal washings and aspirates are unacceptable for Xpert Xpress SARS-CoV-2/FLU/RSV testing.  Fact Sheet for Patients: BloggerCourse.com  Fact Sheet for Healthcare Providers: SeriousBroker.it  This test is not yet approved or cleared by the [place] FDA and has been authorized for detection and/or diagnosis of SARS-CoV-2 by FDA under an Emergency Use Authorization (EUA). This EUA will remain in effect (meaning this test can be used) for the duration of the COVID-19 declaration under Section 564(b)(1) of the Act, 21 U.S.C. section  360bbb-3(b)(1), unless the authorization is terminated or revoked.  Performed at [facility], [address] [place], [place] [address]   Urine culture     Status: None   Collection Time: 03/26/21  5:23 PM   Specimen: In/Out Cath Urine  Result Value Ref Range Status   Specimen Description   Final    IN/OUT CATH URINE Performed at [facility], [address] [place], [place] [address]  Special Requests   Final    NONE Performed at [facility], [address], [place], [place] [address]    Culture   Final    NO GROWTH Performed at [facility], [address], [place], [place] [address]    Report Status 03/28/2021 FINAL  Final  Blood Culture (routine x 2)     Status: None   Collection Time: 03/26/21  5:30 PM   Specimen: BLOOD  Result Value Ref Range Status   Specimen Description BLOOD RIGHT ANTECUBITAL  Final   Special Requests   Final    BOTTLES DRAWN AEROBIC AND ANAEROBIC Blood Culture adequate volume   Culture   Final    NO GROWTH 5 DAYS Performed at [facility], [address], [place], [place] [address]    Report Status 03/31/2021 FINAL  Final  Blood Culture (routine x 2)     Status: None   Collection Time: 03/26/21  5:58 PM   Specimen: BLOOD  Result Value Ref Range Status   Specimen Description   Final    BLOOD LEFT ANTECUBITAL Performed at [facility], [address] [place], [place] [address]    Special Requests   Final    Blood Culture adequate volume BOTTLES DRAWN AEROBIC AND ANAEROBIC Performed at [facility], [address] [place], [place] [address]    Culture   Final    NO GROWTH 5 DAYS Performed at [facility], [address], [place], [place] [address]    Report Status 03/31/2021 FINAL  Final  MRSA PCR Screening     Status: None   Collection Time: 03/29/21 10:00 PM  Result Value Ref Range Status   MRSA by PCR NEGATIVE  NEGATIVE Final    Comment:        The GeneXpert MRSA Assay (FDA approved for NASAL specimens only), is one component of a comprehensive MRSA colonization surveillance program. It is not intended to diagnose MRSA infection nor to guide or monitor treatment for MRSA infections. Performed at [facility], [address], [place], [place] [address]           Radiology Studies: [place] LT LOWER EXTREM LTD SOFT TISSUE NON VASCULAR  Result Date: 03/29/2021 CLINICAL DATA:  Left foot cellulitis.  Rule out abscess. EXAM: ULTRASOUND LEFT LOWER EXTREMITY LIMITED TECHNIQUE: Ultrasound examination of the lower extremity soft tissues was performed in the area of clinical concern. COMPARISON:  Foot radiograph 03/27/2021 FINDINGS: Targeted sonographic evaluation in the area of clinical concern labeled left foot dorsal aspect. There is diffuse and prominent subcutaneous edema but no focal fluid collection. Mild scattered soft tissue vascularity without any peripherally enhancing foci. No sonographic evidence of radiopaque foreign body. IMPRESSION: Prominent subcutaneous edema without sonographic evidence of abscess or focal fluid collection. Electronically Signed   By: Narda Rutherford M.D.   On: 03/29/2021 18:37        Scheduled Meds:  DULoxetine  30 mg Oral Daily   DULoxetine  60 mg Oral Daily   fenofibrate  54 mg Oral Daily   fluticasone  2 spray Each Nare Daily   gabapentin  1,200 mg Oral BID   insulin aspart  0-9 Units Subcutaneous TID WC   insulin glargine  75 Units Subcutaneous QHS   loratadine  10 mg Oral Daily   pravastatin  40 mg Oral Daily   rivaroxaban  20 mg Oral Q supper   Continuous Infusions:  sodium chloride 10 mL/hr  at 03/27/21 1059    ceFAZolin (ANCEF) IV 2 g (03/31/21 0615)          Glade Lloyd, MD [facility] 03/31/2021, 10:42 AM

## 2021-03-31 NOTE — Plan of Care (Signed)
  Problem: Clinical Measurements: Goal: Respiratory complications will improve Outcome: Progressing   Problem: Clinical Measurements: Goal: Cardiovascular complication will be avoided Outcome: Progressing   Problem: Elimination: Goal: Will not experience complications related to bowel motility Outcome: Progressing   Problem: Pain Managment: Goal: General experience of comfort will improve Outcome: Progressing   Problem: Safety: Goal: Ability to remain free from injury will improve Outcome: Progressing   

## 2021-03-31 NOTE — Progress Notes (Signed)
Pharmacy Antibiotic Note  Melanie Reeves is a 70 y.o. female admitted on 03/26/2021 with sepsis 2/2 nonpurulent cellulitis.  Pharmacy has been consulted for cefazolin dosing.  Patient presented with foot swelling, failed outpatient doxycycline which does not have adequate strep coverage. Patient previously on vanc and cefepime; deescalating to cefazolin 2g q8hr 6/16. Pharmacy will follow renal function as well as length of therapy plans.  Day 6 total abx Afebrile WBC improving SCr stable  Plan: Continue cefazolin 2g q8hr Recommend to d/c abx's after tomorrow doses 6/20 to complete 7 days total Follow up renal function, cultures and clinical symptoms.   Height: 5' 2.4" (158.5 cm) Weight: 127 kg (280 lb) IBW/kg (Calculated) : 51.02  Temp (24hrs), Avg:98.1 F (36.7 C), Min:97.8 F (36.6 C), Max:98.5 F (36.9 C)  Recent Labs  Lab 03/26/21 1723 03/27/21 0310 03/28/21 0318 03/30/21 0331  WBC 12.3* 12.3* 13.1* 11.5*  CREATININE 0.85 0.82 0.99 1.03*  LATICACIDVEN 1.1  0.9  --   --   --      Estimated Creatinine Clearance: 68.1 mL/min (A) (by C-G formula based on SCr of 1.03 mg/dL (H)).    Allergies  Allergen Reactions   Simvastatin     Other reaction(s): leg weakness Other reaction(s): leg weakness   Sulfa Antibiotics Other (See Comments)    Cant remember Other reaction(s): as a child    Thank you for allowing pharmacy to be a part of this patient's care.  Hessie Knows, PharmD, BCPS Secure Chat if ?s 03/31/2021 8:57 AM

## 2021-04-01 ENCOUNTER — Encounter (INDEPENDENT_AMBULATORY_CARE_PROVIDER_SITE_OTHER): Payer: Self-pay | Admitting: Family Medicine

## 2021-04-01 ENCOUNTER — Ambulatory Visit (INDEPENDENT_AMBULATORY_CARE_PROVIDER_SITE_OTHER): Payer: PPO | Admitting: Family Medicine

## 2021-04-01 DIAGNOSIS — I5033 Acute on chronic diastolic (congestive) heart failure: Secondary | ICD-10-CM

## 2021-04-01 LAB — GLUCOSE, CAPILLARY
Glucose-Capillary: 204 mg/dL — ABNORMAL HIGH (ref 70–99)
Glucose-Capillary: 196 mg/dL — ABNORMAL HIGH (ref 70–99)
Glucose-Capillary: 256 mg/dL — ABNORMAL HIGH (ref 70–99)
Glucose-Capillary: 230 mg/dL — ABNORMAL HIGH (ref 70–99)
Glucose-Capillary: 370 mg/dL — ABNORMAL HIGH (ref 70–99)

## 2021-04-01 MED ORDER — FUROSEMIDE 10 MG/ML IJ SOLN
60.0000 mg | Freq: Every day | INTRAMUSCULAR | Status: DC
  Administered 2021-04-01 – 2021-04-05 (×5): 60 mg via INTRAVENOUS
  Filled 2021-04-01 (×5): qty 6

## 2021-04-01 MED ORDER — INSULIN GLARGINE 100 UNIT/ML ~~LOC~~ SOLN
90.0000 [IU] | Freq: Every day | SUBCUTANEOUS | Status: DC
  Administered 2021-04-02 – 2021-04-04 (×3): 90 [IU] via SUBCUTANEOUS
  Filled 2021-04-01 (×5): qty 0.9

## 2021-04-01 NOTE — Plan of Care (Signed)
  Problem: Nutrition: Goal: Adequate nutrition will be maintained Outcome: Progressing   

## 2021-04-01 NOTE — Progress Notes (Signed)
Inpatient Diabetes Program Recommendations  AACE/ADA: New Consensus Statement on Inpatient Glycemic Control (2015)  Target Ranges:  Prepandial:   less than 140 mg/dL      Peak postprandial:   less than 180 mg/dL (1-2 hours)      Critically ill patients:  140 - 180 mg/dL   Results for TRISHIA, CUTHRELL (MRN [id number]) as of 04/01/2021 09:27  Ref. Range 03/31/2021 07:32 03/31/2021 11:18 03/31/2021 16:12 03/31/2021 21:41  Glucose-Capillary Latest Ref Range: 70 - 99 mg/dL 786 (H)  2 units NOVOLOG  222 (H)  3 units NOVOLOG  310 (H)  7 units NOVOLOG  328 (H)    75 units LANTUS     Home DM Meds: Novolog 20-40 units TID        Toujeo 150 units Daily        Invokana 300 mg Daily        Metformin 2000 mg QPM        Victoza 1.8 mg Daily   Current Orders: Lantus 75 units QHS      Novolog 0-9 units TID     MD- Please consider adding Novolog Meal Coverage.  Pt eating 100% of meals per documentation.  Novolog 6 units TID with meals  Hold if pt eats <50% of meal, Hold if pt NPO      --Will follow patient during hospitalization--  Ambrose Finland RN, MSN, CDE Diabetes Coordinator Inpatient Glycemic Control Team Team Pager: [phone] (8a-5p)

## 2021-04-01 NOTE — Care Management Important Message (Signed)
Important Message  Patient Details IM Letter given to the Patient. Name: Melanie Reeves MRN: [id number] Date of Birth: Jul 18, 1951   Medicare Important Message Given:  Yes     Caren Macadam 04/01/2021, 2:00 PM

## 2021-04-01 NOTE — Progress Notes (Signed)
Patient ID: Melanie Reeves, female   DOB: Dec 14, 1950, 70 y.o.   MRN: [id number]  PROGRESS NOTE    Melanie Reeves  DZH:[id number] DOB: [birth year] DOA: 03/26/2021 PCP: Ileana Ladd, MD   Brief Narrative:  70 y.o. female with medical history significant for chronic diastolic heart failure, paroxysmal atrial fibrillation chronically anticoagulated on Xarelto, type 2 diabetes mellitus presented with worsening left lower extremity swelling and redness despite being treated with oral doxycycline as an outpatient.  On presentation, she had leukocytosis.  She was started on broad-spectrum antibiotics for cellulitis.  Assessment & Plan:   Left lower extremity cellulitis -Failed outpatient treatment.  X-ray of the foot did not show any osteomyelitis.  Cultures negative so far -Initially started on vancomycin and cefepime which were switched to IV Ancef. -Erythema improving but still has significant erythema and some warmth on the foot.  Continue Ancef for today as well.  No evidence of abscess or fluid collection on soft tissue ultrasound done on 03/29/2021  Chronic venous insufficiency -Resume elastic stockings at home. -Advised elevation of legs whenever she is sitting down.  Diabetes mellitus type 2 with hyperglycemia -Continue Lantus along with CBGs with SSI  Paroxysmal A. fib -Currently rate controlled.  Continue Xarelto  Acute on chronic diastolic heart failure -Strict input and output.  Daily weights.  Fluid restriction.  Reportedly on diuretics q. weekly. -Continue Lasix 60 mg IV daily.  Allergic rhinitis -Continue loratadine  Anxiety/depression --continue duloxetine  Dyslipidemia -Continue fenofibrate and pravastatin  DVT prophylaxis: Xarelto Code Status: Full Family Communication: None at bedside Disposition Plan: Status is: Inpatient  Remains inpatient appropriate because:Inpatient level of care appropriate due to severity of illness  Dispo: The patient is from: Home               Anticipated d/c is to: Home              Patient currently is not medically stable to d/c.   Difficult to place patient No  Consultants: None  Procedures: None  Antimicrobials:  Anti-infectives (From admission, onward)    Start     Dose/Rate Route Frequency Ordered Stop   03/28/21 1400  ceFAZolin (ANCEF) IVPB 2g/100 mL premix        2 g 200 mL/hr over 30 Minutes Intravenous Every 8 hours 03/28/21 0947     03/27/21 0600  vancomycin (VANCOCIN) IVPB 1000 mg/200 mL premix  Status:  Discontinued        1,000 mg 200 mL/hr over 60 Minutes Intravenous Every 12 hours 03/26/21 1807 03/28/21 0947   03/27/21 0200  ceFEPIme (MAXIPIME) 2 g in sodium chloride 0.9 % 100 mL IVPB  Status:  Discontinued        2 g 200 mL/hr over 30 Minutes Intravenous Every 8 hours 03/26/21 1742 03/28/21 0947   03/26/21 1845  vancomycin (VANCOCIN) IVPB 1000 mg/200 mL premix       See Hyperspace for full Linked Orders Report.   1,000 mg 200 mL/hr over 60 Minutes Intravenous  Once 03/26/21 1739 03/27/21 1959   03/26/21 1745  vancomycin (VANCOCIN) IVPB 1000 mg/200 mL premix       See Hyperspace for full Linked Orders Report.   1,000 mg 200 mL/hr over 60 Minutes Intravenous  Once 03/26/21 1739 03/26/21 2052   03/26/21 1715  ceFEPIme (MAXIPIME) 2 g in sodium chloride 0.9 % 100 mL IVPB        2 g 200 mL/hr over 30 Minutes Intravenous  Once 03/26/21  1705 03/26/21 1955   03/26/21 1715  metroNIDAZOLE (FLAGYL) IVPB 500 mg        500 mg 100 mL/hr over 60 Minutes Intravenous  Once 03/26/21 1705 03/26/21 1955   03/26/21 1715  vancomycin (VANCOCIN) IVPB 1000 mg/200 mL premix  Status:  Discontinued        1,000 mg 200 mL/hr over 60 Minutes Intravenous  Once 03/26/21 1705 03/26/21 1739        Subjective: Patient seen and examined at bedside.  No overnight fever, worsening shortness of breath or vomiting reported.  Still complains of significant left foot redness, swelling and warmth. Objective: Vitals:    03/31/21 1312 03/31/21 2135 04/01/21 0531 04/01/21 0653  BP: 128/87 (!) 145/115 (!) 151/81   Pulse: 93 91 90   Resp: 18 19 19    Temp: 98 F (36.7 C) 98 F (36.7 C) 98.1 F (36.7 C)   TempSrc: Oral Oral Oral   SpO2: 98% 97% 96%   Weight:    132.5 kg  Height:        Intake/Output Summary (Last 24 hours) at 04/01/2021 0931 Last data filed at 04/01/2021 0535 Gross per 24 hour  Intake 2340 ml  Output 5400 ml  Net -3060 ml    Filed Weights   03/30/21 0520 03/31/21 0600 04/01/21 0653  Weight: 128.9 kg 127 kg 132.5 kg    Examination:  General exam: No acute distress.  Currently on room air.   Respiratory system: Bilateral decreased breath sounds at bases with scattered crackles  cardiovascular system: S1-S2 heard, rate controlled  gastrointestinal system: Abdomen is morbidly obese, distended mildly, soft and nontender.  Normal bowel sounds heard. Extremities: No clubbing; bilateral lower extremity edema present  Central nervous system: Alert and awake.  No focal neurological deficits.  Moving extremities  skin: Still has significant left foot erythema, tenderness and warmth.  No obvious wound/discharge  psychiatry: Patient is pleasant and calm and cooperative  Data Reviewed: I have personally reviewed following labs and imaging studies  CBC: Recent Labs  Lab 03/26/21 1723 03/27/21 0310 03/28/21 0318 03/30/21 0331  WBC 12.3* 12.3* 13.1* 11.5*  NEUTROABS 7.9* 8.0*  --  6.6  HGB 11.7* 10.5* 11.1* 10.9*  HCT 38.5 34.4* 36.4 36.2  MCV 88.3 89.1 89.2 89.6  PLT 365 300 307 377    Basic Metabolic Panel: Recent Labs  Lab 03/26/21 1723 03/27/21 0310 03/28/21 0318 03/30/21 0331  NA 139 138 140 138  K 3.9 3.6 3.7 3.6  CL 106 108 107 103  CO2 23 24 24 28   GLUCOSE 57* 94 118* 225*  BUN 28* 27* 26* 22  CREATININE 0.85 0.82 0.99 1.03*  CALCIUM 10.0 9.3 9.8 9.3  MG  --  2.0 1.9  --     GFR: Estimated Creatinine Clearance: 69.9 mL/min (A) (by C-G formula based on SCr of  1.03 mg/dL (H)). Liver Function Tests: Recent Labs  Lab 03/26/21 1723 03/27/21 0310  AST 21 21  ALT 27 25  ALKPHOS 47 41  BILITOT 0.3 0.3  PROT 8.0 7.1  ALBUMIN 4.0 3.1*    No results for input(s): LIPASE, AMYLASE in the last 168 hours. No results for input(s): AMMONIA in the last 168 hours. Coagulation Profile: Recent Labs  Lab 03/26/21 1723  INR 1.3*    Cardiac Enzymes: No results for input(s): CKTOTAL, CKMB, CKMBINDEX, TROPONINI in the last 168 hours. BNP (last 3 results) No results for input(s): PROBNP in the last 8760 hours. HbA1C: No results for  input(s): HGBA1C in the last 72 hours. CBG: Recent Labs  Lab 03/31/21 0732 03/31/21 1118 03/31/21 1612 03/31/21 2141 04/01/21 0736  GLUCAP 158* 222* 310* 328* 196*    Lipid Profile: No results for input(s): CHOL, HDL, LDLCALC, TRIG, CHOLHDL, LDLDIRECT in the last 72 hours. Thyroid Function Tests: No results for input(s): TSH, T4TOTAL, FREET4, T3FREE, THYROIDAB in the last 72 hours. Anemia Panel: No results for input(s): VITAMINB12, FOLATE, FERRITIN, TIBC, IRON, RETICCTPCT in the last 72 hours. Sepsis Labs: Recent Labs  Lab 03/26/21 1723  LATICACIDVEN 1.1  0.9     Recent Results (from the past 240 hour(s))  Resp Panel by RT-PCR (Flu A&B, Covid) Nasopharyngeal Swab     Status: None   Collection Time: 03/26/21  5:23 PM   Specimen: Nasopharyngeal Swab; Nasopharyngeal(NP) swabs in vial transport medium  Result Value Ref Range Status   SARS Coronavirus 2 by RT PCR NEGATIVE NEGATIVE Final    Comment: (NOTE) SARS-CoV-2 target nucleic acids are NOT DETECTED.  The SARS-CoV-2 RNA is generally detectable in upper respiratory specimens during the acute phase of infection. The lowest concentration of SARS-CoV-2 viral copies this assay can detect is 138 copies/mL. A negative result does not preclude SARS-Cov-2 infection and should not be used as the sole basis for treatment or other patient management decisions. A  negative result may occur with  improper specimen collection/handling, submission of specimen other than nasopharyngeal swab, presence of viral mutation(s) within the areas targeted by this assay, and inadequate number of viral copies(<138 copies/mL). A negative result must be combined with clinical observations, patient history, and epidemiological information. The expected result is Negative.  Fact Sheet for Patients:  BloggerCourse.com  Fact Sheet for Healthcare Providers:  SeriousBroker.it  This test is no t yet approved or cleared by the [place] FDA and  has been authorized for detection and/or diagnosis of SARS-CoV-2 by FDA under an Emergency Use Authorization (EUA). This EUA will remain  in effect (meaning this test can be used) for the duration of the COVID-19 declaration under Section 564(b)(1) of the Act, 21 U.S.C.section 360bbb-3(b)(1), unless the authorization is terminated  or revoked sooner.       Influenza A by PCR NEGATIVE NEGATIVE Final   Influenza B by PCR NEGATIVE NEGATIVE Final    Comment: (NOTE) The Xpert Xpress SARS-CoV-2/FLU/RSV plus assay is intended as an aid in the diagnosis of influenza from Nasopharyngeal swab specimens and should not be used as a sole basis for treatment. Nasal washings and aspirates are unacceptable for Xpert Xpress SARS-CoV-2/FLU/RSV testing.  Fact Sheet for Patients: BloggerCourse.com  Fact Sheet for Healthcare Providers: SeriousBroker.it  This test is not yet approved or cleared by the [place] FDA and has been authorized for detection and/or diagnosis of SARS-CoV-2 by FDA under an Emergency Use Authorization (EUA). This EUA will remain in effect (meaning this test can be used) for the duration of the COVID-19 declaration under Section 564(b)(1) of the Act, 21 U.S.C. section 360bbb-3(b)(1), unless the authorization  is terminated or revoked.  Performed at [facility], [address] [place], [place] [address]   Urine culture     Status: None   Collection Time: 03/26/21  5:23 PM   Specimen: In/Out Cath Urine  Result Value Ref Range Status   Specimen Description   Final    IN/OUT CATH URINE Performed at [facility], [address] [place], [place] [address]    Special Requests   Final    NONE Performed  at [facility], [address] [place], [place] [address]    Culture   Final    NO GROWTH Performed at [facility], [address], [place], [place] [address]    Report Status 03/28/2021 FINAL  Final  Blood Culture (routine x 2)     Status: None   Collection Time: 03/26/21  5:30 PM   Specimen: BLOOD  Result Value Ref Range Status   Specimen Description BLOOD RIGHT ANTECUBITAL  Final   Special Requests   Final    BOTTLES DRAWN AEROBIC AND ANAEROBIC Blood Culture adequate volume   Culture   Final    NO GROWTH 5 DAYS Performed at [facility], [address] [place], [place] [address]    Report Status 03/31/2021 FINAL  Final  Blood Culture (routine x 2)     Status: None   Collection Time: 03/26/21  5:58 PM   Specimen: BLOOD  Result Value Ref Range Status   Specimen Description   Final    BLOOD LEFT ANTECUBITAL Performed at [facility], [address] [place], [place] [address]    Special Requests   Final    Blood Culture adequate volume BOTTLES DRAWN AEROBIC AND ANAEROBIC Performed at [facility], [address] [place], [place] [address]    Culture   Final    NO GROWTH 5 DAYS Performed at [facility], [address], [place], [place] [address]    Report Status 03/31/2021 FINAL  Final  MRSA PCR Screening     Status: None   Collection Time: 03/29/21 10:00 PM  Result Value Ref Range Status   MRSA by PCR NEGATIVE NEGATIVE Final    Comment:        The  GeneXpert MRSA Assay (FDA approved for NASAL specimens only), is one component of a comprehensive MRSA colonization surveillance program. It is not intended to diagnose MRSA infection nor to guide or monitor treatment for MRSA infections. Performed at [facility], [address] [place], [place] [address]           Radiology Studies: No results found.      Scheduled Meds:  DULoxetine  30 mg Oral Daily   DULoxetine  60 mg Oral Daily   fenofibrate  54 mg Oral Daily   fluticasone  2 spray Each Nare Daily   gabapentin  1,200 mg Oral BID   insulin aspart  0-9 Units Subcutaneous TID WC   insulin glargine  75 Units Subcutaneous QHS   loratadine  10 mg Oral Daily   pravastatin  40 mg Oral Daily   rivaroxaban  20 mg Oral Q supper   Continuous Infusions:  sodium chloride 10 mL/hr at 03/27/21 1059    ceFAZolin (ANCEF) IV 2 g (04/01/21 0535)          Glade Lloyd, MD [facility] 04/01/2021, 9:31 AM

## 2021-04-01 NOTE — Plan of Care (Signed)
  Problem: Education: Goal: Knowledge of General Education information will improve Description Including pain rating scale, medication(s)/side effects and non-pharmacologic comfort measures Outcome: Progressing   Problem: Health Behavior/Discharge Planning: Goal: Ability to manage health-related needs will improve Outcome: Progressing   

## 2021-04-02 LAB — CBC WITH DIFFERENTIAL/PLATELET
Abs Immature Granulocytes: 0.11 10*3/uL — ABNORMAL HIGH (ref 0.00–0.07)
Basophils Absolute: 0.1 10*3/uL (ref 0.0–0.1)
Basophils Relative: 1 %
Eosinophils Absolute: 0.2 10*3/uL (ref 0.0–0.5)
Eosinophils Relative: 2 %
HCT: 35.1 % — ABNORMAL LOW (ref 36.0–46.0)
Hemoglobin: 10.8 g/dL — ABNORMAL LOW (ref 12.0–15.0)
Immature Granulocytes: 1 %
Lymphocytes Relative: 26 %
Lymphs Abs: 3.5 10*3/uL (ref 0.7–4.0)
MCH: 27.4 pg (ref 26.0–34.0)
MCHC: 30.8 g/dL (ref 30.0–36.0)
MCV: 89.1 fL (ref 80.0–100.0)
Monocytes Absolute: 0.8 10*3/uL (ref 0.1–1.0)
Monocytes Relative: 6 %
Neutro Abs: 8.5 10*3/uL — ABNORMAL HIGH (ref 1.7–7.7)
Neutrophils Relative %: 64 %
Platelets: 358 10*3/uL (ref 150–400)
RBC: 3.94 MIL/uL (ref 3.87–5.11)
RDW: 14.5 % (ref 11.5–15.5)
WBC: 13.1 10*3/uL — ABNORMAL HIGH (ref 4.0–10.5)
nRBC: 0 % (ref 0.0–0.2)

## 2021-04-02 LAB — MAGNESIUM: Magnesium: 1.9 mg/dL (ref 1.7–2.4)

## 2021-04-02 LAB — GLUCOSE, CAPILLARY
Glucose-Capillary: 204 mg/dL — ABNORMAL HIGH (ref 70–99)
Glucose-Capillary: 280 mg/dL — ABNORMAL HIGH (ref 70–99)
Glucose-Capillary: 270 mg/dL — ABNORMAL HIGH (ref 70–99)
Glucose-Capillary: 323 mg/dL — ABNORMAL HIGH (ref 70–99)

## 2021-04-02 LAB — BASIC METABOLIC PANEL
Anion gap: 9 (ref 5–15)
BUN: 29 mg/dL — ABNORMAL HIGH (ref 8–23)
CO2: 29 mmol/L (ref 22–32)
Calcium: 9.5 mg/dL (ref 8.9–10.3)
Chloride: 98 mmol/L (ref 98–111)
Creatinine, Ser: 0.91 mg/dL (ref 0.44–1.00)
GFR, Estimated: >60 mL/min (ref >60)
Glucose, Bld: 320 mg/dL — ABNORMAL HIGH (ref 70–99)
Potassium: 3.7 mmol/L (ref 3.5–5.1)
Sodium: 136 mmol/L (ref 135–145)

## 2021-04-02 MED ORDER — INSULIN ASPART 100 UNIT/ML IJ SOLN
6.0000 [IU] | Freq: Three times a day (TID) | INTRAMUSCULAR | Status: DC
  Administered 2021-04-02 – 2021-04-04 (×7): 6 [IU] via SUBCUTANEOUS

## 2021-04-02 NOTE — Progress Notes (Signed)
I reviewed and concur with student's documentation.  

## 2021-04-02 NOTE — Progress Notes (Signed)
Patient ID: Melanie Reeves, female   DOB: 06/25/1951, 70 y.o.   MRN: [id number]  PROGRESS NOTE    Melanie Reeves  Melanie Reeves DOB: 10/06/1951 DOA: 03/26/2021 PCP: Ileana LaddWong, Francis P, MD   Brief Narrative:  70 y.o. female with medical history significant for chronic diastolic heart failure, paroxysmal atrial fibrillation chronically anticoagulated on Xarelto, type 2 diabetes mellitus presented with worsening left lower extremity swelling and redness despite being treated with oral doxycycline as an outpatient.  On presentation, she had leukocytosis.  She was started on broad-spectrum antibiotics for cellulitis.  Assessment & Plan:   Left lower extremity cellulitis -Failed outpatient treatment.  X-ray of the foot did not show any osteomyelitis.  Cultures negative so far -Initially started on vancomycin and cefepime which were switched to IV Ancef. -Erythema improving but still has significant erythema and some warmth on the foot.  We will continue Ancef IV for at least 1 more day.  No evidence of abscess or fluid collection on soft tissue ultrasound done on 03/29/2021  Chronic venous insufficiency -Resume elastic stockings at home. -Advised elevation of legs whenever she is sitting down.  Diabetes mellitus type 2 with hyperglycemia -Continue Lantus along with CBGs with SSI.  Add NovoLog with meals.  Paroxysmal A. fib -Currently rate controlled.  Continue Xarelto  Acute on chronic diastolic heart failure -Strict input and output.  Daily weights.  Fluid restriction.  Reportedly on diuretics q. weekly. -Continue Lasix 60 mg IV daily.  Allergic rhinitis -Continue loratadine  Anxiety/depression --continue duloxetine  Dyslipidemia -Continue fenofibrate and pravastatin  DVT prophylaxis: Xarelto Code Status: Full Family Communication: None at bedside Disposition Plan: Status is: Inpatient  Remains inpatient appropriate because:Inpatient level of care appropriate due to severity of  illness  Dispo: The patient is from: Home              Anticipated d/c is to: Home              Patient currently is not medically stable to d/c.   Difficult to place patient No  Consultants: None  Procedures: None  Antimicrobials:  Anti-infectives (From admission, onward)    Start     Dose/Rate Route Frequency Ordered Stop   03/28/21 1400  ceFAZolin (ANCEF) IVPB 2g/100 mL premix        2 g 200 mL/hr over 30 Minutes Intravenous Every 8 hours 03/28/21 0947     03/27/21 0600  vancomycin (VANCOCIN) IVPB 1000 mg/200 mL premix  Status:  Discontinued        1,000 mg 200 mL/hr over 60 Minutes Intravenous Every 12 hours 03/26/21 1807 03/28/21 0947   03/27/21 0200  ceFEPIme (MAXIPIME) 2 g in sodium chloride 0.9 % 100 mL IVPB  Status:  Discontinued        2 g 200 mL/hr over 30 Minutes Intravenous Every 8 hours 03/26/21 1742 03/28/21 0947   03/26/21 1845  vancomycin (VANCOCIN) IVPB 1000 mg/200 mL premix       See Hyperspace for full Linked Orders Report.   1,000 mg 200 mL/hr over 60 Minutes Intravenous  Once 03/26/21 1739 03/27/21 1959   03/26/21 1745  vancomycin (VANCOCIN) IVPB 1000 mg/200 mL premix       See Hyperspace for full Linked Orders Report.   1,000 mg 200 mL/hr over 60 Minutes Intravenous  Once 03/26/21 1739 03/26/21 2052   03/26/21 1715  ceFEPIme (MAXIPIME) 2 g in sodium chloride 0.9 % 100 mL IVPB        2  g 200 mL/hr over 30 Minutes Intravenous  Once 03/26/21 1705 03/26/21 1955   03/26/21 1715  metroNIDAZOLE (FLAGYL) IVPB 500 mg        500 mg 100 mL/hr over 60 Minutes Intravenous  Once 03/26/21 1705 03/26/21 1955   03/26/21 1715  vancomycin (VANCOCIN) IVPB 1000 mg/200 mL premix  Status:  Discontinued        1,000 mg 200 mL/hr over 60 Minutes Intravenous  Once 03/26/21 1705 03/26/21 1739        Subjective: Patient seen and examined at bedside.  Denies worsening shortness of breath, fever or vomiting.  Still feels that her foot is significantly red with some pain.    Objective: Vitals:   04/01/21 1345 04/01/21 2205 04/02/21 0548 04/02/21 0830  BP: 129/72 (!) 143/73 (!) 120/59 131/84  Pulse: 89 90 74 82  Resp: 18 15 16 18   Temp: 98.1 F (36.7 C) 97.7 F (36.5 C) 97.6 F (36.4 C) 97.9 F (36.6 C)  TempSrc: Oral Oral Oral Oral  SpO2: 95% 97% 95% 97%  Weight:   130.8 kg   Height:        Intake/Output Summary (Last 24 hours) at 04/02/2021 1025 Last data filed at 04/02/2021 0548 Gross per 24 hour  Intake 1130.66 ml  Output 2400 ml  Net -1269.34 ml    Filed Weights   03/31/21 0600 04/01/21 0653 04/02/21 0548  Weight: 127 kg 132.5 kg 130.8 kg    Examination:  General exam: On room air currently.  No distress. Respiratory system: Decreased breath sounds at bases bilaterally, no wheezing  cardiovascular system: Rate controlled, S1-S2 heard gastrointestinal system: Abdomen is morbidly obese, slightly distended, soft and nontender.  Bowel sounds are heard  extremities: Bilateral lower extremity edema present; no cyanosis Central nervous system: Awake and oriented.  No focal neurological deficits.  Moves extremities skin: Left foot still erythematous with some swelling and warmth.  No obvious wound/discharge  psychiatry: Patient is calm, cooperative and pleasant  Data Reviewed: I have personally reviewed following labs and imaging studies  CBC: Recent Labs  Lab 03/26/21 1723 03/27/21 0310 03/28/21 0318 03/30/21 0331 04/02/21 0333  WBC 12.3* 12.3* 13.1* 11.5* 13.1*  NEUTROABS 7.9* 8.0*  --  6.6 8.5*  HGB 11.7* 10.5* 11.1* 10.9* 10.8*  HCT 38.5 34.4* 36.4 36.2 35.1*  MCV 88.3 89.1 89.2 89.6 89.1  PLT 365 300 307 377 358    Basic Metabolic Panel: Recent Labs  Lab 03/26/21 1723 03/27/21 0310 03/28/21 0318 03/30/21 0331 04/02/21 0333  NA 139 138 140 138 136  K 3.9 3.6 3.7 3.6 3.7  CL 106 108 107 103 98  CO2 23 24 24 28 29   GLUCOSE 57* 94 118* 225* 320*  BUN 28* 27* 26* 22 29*  CREATININE 0.85 0.82 0.99 1.03* 0.91  CALCIUM  10.0 9.3 9.8 9.3 9.5  MG  --  2.0 1.9  --  1.9    GFR: Estimated Creatinine Clearance: 78.5 mL/min (by C-G formula based on SCr of 0.91 mg/dL). Liver Function Tests: Recent Labs  Lab 03/26/21 1723 03/27/21 0310  AST 21 21  ALT 27 25  ALKPHOS 47 41  BILITOT 0.3 0.3  PROT 8.0 7.1  ALBUMIN 4.0 3.1*    No results for input(s): LIPASE, AMYLASE in the last 168 hours. No results for input(s): AMMONIA in the last 168 hours. Coagulation Profile: Recent Labs  Lab 03/26/21 1723  INR 1.3*    Cardiac Enzymes: No results for input(s): CKTOTAL, CKMB, CKMBINDEX,  TROPONINI in the last 168 hours. BNP (last 3 results) No results for input(s): PROBNP in the last 8760 hours. HbA1C: No results for input(s): HGBA1C in the last 72 hours. CBG: Recent Labs  Lab 04/01/21 0736 04/01/21 1126 04/01/21 1616 04/01/21 2207 04/02/21 0717  GLUCAP 196* 256* 230* 370* 204*    Lipid Profile: No results for input(s): CHOL, HDL, LDLCALC, TRIG, CHOLHDL, LDLDIRECT in the last 72 hours. Thyroid Function Tests: No results for input(s): TSH, T4TOTAL, FREET4, T3FREE, THYROIDAB in the last 72 hours. Anemia Panel: No results for input(s): VITAMINB12, FOLATE, FERRITIN, TIBC, IRON, RETICCTPCT in the last 72 hours. Sepsis Labs: Recent Labs  Lab 03/26/21 1723  LATICACIDVEN 1.1  0.9     Recent Results (from the past 240 hour(s))  Resp Panel by RT-PCR (Flu A&B, Covid) Nasopharyngeal Swab     Status: None   Collection Time: 03/26/21  5:23 PM   Specimen: Nasopharyngeal Swab; Nasopharyngeal(NP) swabs in vial transport medium  Result Value Ref Range Status   SARS Coronavirus 2 by RT PCR NEGATIVE NEGATIVE Final    Comment: (NOTE) SARS-CoV-2 target nucleic acids are NOT DETECTED.  The SARS-CoV-2 RNA is generally detectable in upper respiratory specimens during the acute phase of infection. The lowest concentration of SARS-CoV-2 viral copies this assay can detect is 138 copies/mL. A negative result does  not preclude SARS-Cov-2 infection and should not be used as the sole basis for treatment or other patient management decisions. A negative result may occur with  improper specimen collection/handling, submission of specimen other than nasopharyngeal swab, presence of viral mutation(s) within the areas targeted by this assay, and inadequate number of viral copies(<138 copies/mL). A negative result must be combined with clinical observations, patient history, and epidemiological information. The expected result is Negative.  Fact Sheet for Patients:  BloggerCourse.com  Fact Sheet for Healthcare Providers:  SeriousBroker.it  This test is no t yet approved or cleared by the [place] FDA and  has been authorized for detection and/or diagnosis of SARS-CoV-2 by FDA under an Emergency Use Authorization (EUA). This EUA will remain  in effect (meaning this test can be used) for the duration of the COVID-19 declaration under Section 564(b)(1) of the Act, 21 U.S.C.section 360bbb-3(b)(1), unless the authorization is terminated  or revoked sooner.       Influenza A by PCR NEGATIVE NEGATIVE Final   Influenza B by PCR NEGATIVE NEGATIVE Final    Comment: (NOTE) The Xpert Xpress SARS-CoV-2/FLU/RSV plus assay is intended as an aid in the diagnosis of influenza from Nasopharyngeal swab specimens and should not be used as a sole basis for treatment. Nasal washings and aspirates are unacceptable for Xpert Xpress SARS-CoV-2/FLU/RSV testing.  Fact Sheet for Patients: BloggerCourse.com  Fact Sheet for Healthcare Providers: SeriousBroker.it  This test is not yet approved or cleared by the [place] FDA and has been authorized for detection and/or diagnosis of SARS-CoV-2 by FDA under an Emergency Use Authorization (EUA). This EUA will remain in effect (meaning this test can be used) for the  duration of the COVID-19 declaration under Section 564(b)(1) of the Act, 21 U.S.C. section 360bbb-3(b)(1), unless the authorization is terminated or revoked.  Performed at [facility], [address] [place], [place] [address]   Urine culture     Status: None   Collection Time: 03/26/21  5:23 PM   Specimen: In/Out Cath Urine  Result Value Ref Range Status   Specimen Description   Final    IN/OUT CATH URINE Performed  at [facility], [address] [place], [place] [address]    Special Requests   Final    NONE Performed at [facility], [address], [place], [place] [address]    Culture   Final    NO GROWTH Performed at [facility], [address], [place], [place] [address]    Report Status 03/28/2021 FINAL  Final  Blood Culture (routine x 2)     Status: None   Collection Time: 03/26/21  5:30 PM   Specimen: BLOOD  Result Value Ref Range Status   Specimen Description BLOOD RIGHT ANTECUBITAL  Final   Special Requests   Final    BOTTLES DRAWN AEROBIC AND ANAEROBIC Blood Culture adequate volume   Culture   Final    NO GROWTH 5 DAYS Performed at [facility], [address] [place], [place] [address]    Report Status 03/31/2021 FINAL  Final  Blood Culture (routine x 2)     Status: None   Collection Time: 03/26/21  5:58 PM   Specimen: BLOOD  Result Value Ref Range Status   Specimen Description   Final    BLOOD LEFT ANTECUBITAL Performed at [facility], [address], [place], [place] [address]    Special Requests   Final    Blood Culture adequate volume BOTTLES DRAWN AEROBIC AND ANAEROBIC Performed at [facility], [address] [place], [place] [address]    Culture   Final    NO GROWTH 5 DAYS Performed at [facility], [address] [place], [place] [address]    Report Status 03/31/2021 FINAL  Final  MRSA PCR Screening     Status: None    Collection Time: 03/29/21 10:00 PM  Result Value Ref Range Status   MRSA by PCR NEGATIVE NEGATIVE Final    Comment:        The GeneXpert MRSA Assay (FDA approved for NASAL specimens only), is one component of a comprehensive MRSA colonization surveillance program. It is not intended to diagnose MRSA infection nor to guide or monitor treatment for MRSA infections. Performed at [facility], [address], [place], [place] [address]           Radiology Studies: No results found.      Scheduled Meds:  DULoxetine  30 mg Oral Daily   DULoxetine  60 mg Oral Daily   fenofibrate  54 mg Oral Daily   fluticasone  2 spray Each Nare Daily   furosemide  60 mg Intravenous Daily   gabapentin  1,200 mg Oral BID   insulin aspart  0-9 Units Subcutaneous TID WC   insulin aspart  6 Units Subcutaneous TID WC   insulin glargine  90 Units Subcutaneous QHS   loratadine  10 mg Oral Daily   pravastatin  40 mg Oral Daily   rivaroxaban  20 mg Oral Q supper   Continuous Infusions:  sodium chloride 10 mL/hr at 03/27/21 1059    ceFAZolin (ANCEF) IV 2 g (04/02/21 0546)          Glade Lloyd, MD [facility] 04/02/2021, 10:25 AM

## 2021-04-02 NOTE — Plan of Care (Signed)
?  Problem: Elimination: ?Goal: Will not experience complications related to bowel motility ?Outcome: Progressing ?  ?Problem: Pain Managment: ?Goal: General experience of comfort will improve ?Outcome: Progressing ?  ?Problem: Safety: ?Goal: Ability to remain free from injury will improve ?Outcome: Progressing ?  ?

## 2021-04-03 ENCOUNTER — Ambulatory Visit: Payer: PPO | Admitting: Endocrinology

## 2021-04-03 ENCOUNTER — Inpatient Hospital Stay (HOSPITAL_COMMUNITY): Payer: PPO

## 2021-04-03 LAB — BASIC METABOLIC PANEL
Anion gap: 10 (ref 5–15)
BUN: 31 mg/dL — ABNORMAL HIGH (ref 8–23)
CO2: 28 mmol/L (ref 22–32)
Calcium: 9.8 mg/dL (ref 8.9–10.3)
Chloride: 98 mmol/L (ref 98–111)
Creatinine, Ser: 0.89 mg/dL (ref 0.44–1.00)
GFR, Estimated: >60 mL/min (ref >60)
Glucose, Bld: 318 mg/dL — ABNORMAL HIGH (ref 70–99)
Potassium: 3.4 mmol/L — ABNORMAL LOW (ref 3.5–5.1)
Sodium: 136 mmol/L (ref 135–145)

## 2021-04-03 LAB — CBC WITH DIFFERENTIAL/PLATELET
Abs Immature Granulocytes: 0.07 10*3/uL (ref 0.00–0.07)
Basophils Absolute: 0.1 10*3/uL (ref 0.0–0.1)
Basophils Relative: 1 %
Eosinophils Absolute: 0.2 10*3/uL (ref 0.0–0.5)
Eosinophils Relative: 2 %
HCT: 35 % — ABNORMAL LOW (ref 36.0–46.0)
Hemoglobin: 10.7 g/dL — ABNORMAL LOW (ref 12.0–15.0)
Immature Granulocytes: 1 %
Lymphocytes Relative: 25 %
Lymphs Abs: 3.4 10*3/uL (ref 0.7–4.0)
MCH: 27.2 pg (ref 26.0–34.0)
MCHC: 30.6 g/dL (ref 30.0–36.0)
MCV: 89.1 fL (ref 80.0–100.0)
Monocytes Absolute: 0.6 10*3/uL (ref 0.1–1.0)
Monocytes Relative: 5 %
Neutro Abs: 9 10*3/uL — ABNORMAL HIGH (ref 1.7–7.7)
Neutrophils Relative %: 66 %
Platelets: 342 10*3/uL (ref 150–400)
RBC: 3.93 MIL/uL (ref 3.87–5.11)
RDW: 14.5 % (ref 11.5–15.5)
WBC: 13.4 10*3/uL — ABNORMAL HIGH (ref 4.0–10.5)
nRBC: 0 % (ref 0.0–0.2)

## 2021-04-03 LAB — GLUCOSE, CAPILLARY
Glucose-Capillary: 189 mg/dL — ABNORMAL HIGH (ref 70–99)
Glucose-Capillary: 186 mg/dL — ABNORMAL HIGH (ref 70–99)
Glucose-Capillary: 329 mg/dL — ABNORMAL HIGH (ref 70–99)
Glucose-Capillary: 309 mg/dL — ABNORMAL HIGH (ref 70–99)

## 2021-04-03 LAB — MAGNESIUM: Magnesium: 1.8 mg/dL (ref 1.7–2.4)

## 2021-04-03 LAB — C-REACTIVE PROTEIN: CRP: 5.3 mg/dL — ABNORMAL HIGH (ref <1.0)

## 2021-04-03 MED ORDER — SODIUM CHLORIDE 0.9 % IV SOLN
4.0000 mg/kg | Freq: Every day | INTRAVENOUS | Status: DC
  Administered 2021-04-03 – 2021-04-04 (×2): 350 mg via INTRAVENOUS
  Filled 2021-04-03 (×3): qty 7

## 2021-04-03 MED ORDER — GADOBUTROL 1 MMOL/ML IV SOLN
10.0000 mL | Freq: Once | INTRAVENOUS | Status: AC | PRN
  Administered 2021-04-03: 10 mL via INTRAVENOUS

## 2021-04-03 NOTE — Plan of Care (Signed)
  Problem: Coping: Goal: Level of anxiety will decrease Outcome: Progressing   Problem: Pain Managment: Goal: General experience of comfort will improve Outcome: Progressing   Problem: Safety: Goal: Ability to remain free from injury will improve Outcome: Progressing   Problem: Skin Integrity: Goal: Risk for impaired skin integrity will decrease Outcome: Progressing   

## 2021-04-03 NOTE — Consult Note (Addendum)
Cellulitis                                                                  [facility]                                                                                        Patient Identification: Patient Name: Melanie Reeves MRN: [id number] Admit Date: 03/26/2021  4:00 PM Today's Date: 04/03/2021 Reason for consult: Cellulitis Requesting provider: Wound on foundFang Xu   Principal Problem:   Cellulitis of left lower extremity Active Problems:   Paroxysmal atrial fibrillation (HCC)   Hyperlipidemia   Diabetes mellitus (HCC)   Left foot pain   Chronic diastolic CHF (congestive heart failure) (HCC)   Antibiotics: Vancomycin 6/14-6/15, cefazolin 6/16-current                    Cefepime 6/14-6/15                    Metronidazole 6/14  Lines/Tubes: PIV's  Assessment Left Foot cellulitis: Blood cultures no growth   Chronic venous insufficiency Morbid Obesity/OSA  Acute on chronic diastolic heart failure  Comments- she has similar redness in her bilateral leg which she says has been the same. She says the redness/swelling in the  left foot is new than her normal redness. Left dorsal foot is somewhat tender on exam   Recommendations  Will change cefazolin to daptomycin, pharmacy to dose.  MRI left foot ordered Monitor CPK Continue compression stockings Elevate legs Following   Rest of the management as per the primary team. Please call with questions or concerns.  Thank you for the consult  Odette Fraction, MD Infectious Disease Physician [facility] [address] [place], [place] [address] Phone: [phone]  Fax: [phone]  __________________________________________________________________________________________________________ HPI and Hospital Course: 70 year old female with past medical history significant for chronic diastolic heart failure, paroxysmal A. fib on Xarelto, type II  DM, chronic venous insufficiency who presented to the ED on 6/14 with complaints of left lower extremity swelling and redness despite being treated with doxycycline as an outpatient.  She was evaluated by her PCP for this on 6/11 at which time she was diagnosed with left lower extremity cellulitis and started on doxycycline which she was taking as prescribed however the redness continued to persist.  She was reevaluated by her PCP on the day of admission and she was advised to go to the ED for worsening erythema and swelling of the left lower extremity.   At ED, she was afebrile but had leukocytosis of 12.3.  She was started on broad-spectrum antibiotics with Vancomycin, cefepime and metronidazole.  Antibiotics have been de-escalated to cefazolin.  Blood cultures x2 no growth to date.   Work-up remarkable for left x-ray with cellulitis and no findings of osteomyelitis. Ultrasound left lower extremity prominent  subcutaneous edema  ROS: General- Denies fever, chills, loss of appetite and loss of weight HEENT - Denies headache, blurry vision, neck pain, sinus pain Chest - Denies any chest pain, SOB or cough CVS- Denies any dizziness/lightheadedness, syncopal attacks, palpitations Abdomen- Denies any nausea, vomiting, abdominal pain, hematochezia and diarrhea Neuro - Denies any weakness, numbness, tingling sensation Psych - Denies any changes in mood irritability or depressive symptoms GU- Denies any burning, dysuria, hematuria or increased frequency of urination Skin - denies any rashes/lesions MSK - denies any joint pain/swelling or restricted ROM   Past Medical History:  Diagnosis Date   Abnormal electrocardiogram 08/14/2014   LBBB   Anemia    Anxiety    Arthritis    Atrial fibrillation (HCC)    Bilateral swelling of feet    Chest pain    Constipation    Depression    Diabetes mellitus (HCC)    Difficulty walking    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   Qualifier: Diagnosis of  By: Romeo Apple  MD, Stanley     Dyspnea 08/14/2014   Edema    Gallbladder problem    GERD (gastroesophageal reflux disease)    Hyperlipidemia    Hypertension    LOW BACK PAIN 12/20/2009   Qualifier: Diagnosis of  By: Romeo Apple MD, Stanley     Morbid obesity (HCC)    OSA (obstructive sleep apnea) 08/16/2014   Osteoarthritis    Other fatigue    Palpitations    Seasonal allergies    Shortness of breath    Shortness of breath on exertion    Sinusitis    SPINAL STENOSIS 12/20/2009   Qualifier: Diagnosis of  By: Romeo Apple MD, Stanley     Stage 3 chronic kidney disease [facility])    Past Surgical History:  Procedure Laterality Date   CHOLECYSTECTOMY     COLONOSCOPY WITH PROPOFOL N/A 11/29/2014   Procedure: COLONOSCOPY WITH PROPOFOL;  Surgeon: Willis Modena, MD;  Location: [facility];  Service: Endoscopy;  Laterality: N/A;   SKIN CANCER EXCISION     TONSILLECTOMY       Scheduled Meds:  DULoxetine  30 mg Oral Daily   DULoxetine  60 mg Oral Daily   fenofibrate  54 mg Oral Daily   fluticasone  2 spray Each Nare Daily   furosemide  60 mg Intravenous Daily   gabapentin  1,200 mg Oral BID   insulin aspart  0-9 Units Subcutaneous TID WC   insulin aspart  6 Units Subcutaneous TID WC   insulin glargine  90 Units Subcutaneous QHS   loratadine  10 mg Oral Daily   pravastatin  40 mg Oral Daily   rivaroxaban  20 mg Oral Q supper   Continuous Infusions:  sodium chloride 250 mL (04/03/21 0506)    ceFAZolin (ANCEF) IV 2 g (04/03/21 0608)   PRN Meds:.sodium chloride, acetaminophen **OR** acetaminophen, cyclobenzaprine, HYDROcodone-acetaminophen, ondansetron (ZOFRAN) IV  Allergies  Allergen Reactions   Simvastatin     Other reaction(s): leg weakness Other reaction(s): leg weakness   Sulfa Antibiotics Other (See Comments)    Cant remember Other reaction(s): as a child   Social History   Socioeconomic History   Marital status: Married    Spouse name: Greggory Stallion   Number of children: 1   Years of education:  Not on file   Highest education level: Not on file  Occupational History   Occupation: Retired    Comment: Therapist, sports  Tobacco Use   Smoking status: Former    Packs/day: 2.00  Years: 20.00    Pack years: 40.00    Types: Cigarettes    Quit date: 11/22/1993    Years since quitting: 27.3   Smokeless tobacco: Former  Building services engineer Use: Never used  Substance and Sexual Activity   Alcohol use: Yes    Alcohol/week: 0.0 standard drinks   Drug use: No   Sexual activity: Not Currently    Partners: Male  Other Topics Concern   Not on file  Social History Narrative   Not on file   Social Determinants of Health   Financial Resource Strain: Not on file  Food Insecurity: Not on file  Transportation Needs: Not on file  Physical Activity: Not on file  Stress: Not on file  Social Connections: Not on file  Intimate Partner Violence: Not on file    Vitals BP (!) 134/93 (BP Location: Right Arm)   Pulse 86   Temp 97.6 F (36.4 C) (Oral)   Resp 18   Ht 5' 2.4" (1.585 m)   Wt 130.8 kg   SpO2 95%   BMI 52.06 kg/m    Physical Exam Constitutional: Not in acute distress, morbidly obese    Comments:   Cardiovascular:     Rate and Rhythm: Normal rate and regular rhythm.     Heart sounds: Distant heart sounds  Pulmonary:     Effort: Pulmonary effort is normal.     Comments:  Abdominal:     Palpations: Abdomen is soft.     Tenderness: Obese abdomen  Musculoskeletal:        General: No swelling or tenderness.  Chronic bilateral lower extremity swelling, left foot has erythema/swelling and tenderness  Skin:    Comments: Chronic venous stasis in the lower extremities.  Left feet erythema  Neurological:     General: No focal deficit present.   Psychiatric:        Mood and Affect: Mood normal.    Pertinent Microbiology Results for orders placed or performed during the hospital encounter of 03/26/21  Resp Panel by RT-PCR (Flu A&B, Covid) Nasopharyngeal Swab      Status: None   Collection Time: 03/26/21  5:23 PM   Specimen: Nasopharyngeal Swab; Nasopharyngeal(NP) swabs in vial transport medium  Result Value Ref Range Status   SARS Coronavirus 2 by RT PCR NEGATIVE NEGATIVE Final    Comment: (NOTE) SARS-CoV-2 target nucleic acids are NOT DETECTED.  The SARS-CoV-2 RNA is generally detectable in upper respiratory specimens during the acute phase of infection. The lowest concentration of SARS-CoV-2 viral copies this assay can detect is 138 copies/mL. A negative result does not preclude SARS-Cov-2 infection and should not be used as the sole basis for treatment or other patient management decisions. A negative result may occur with  improper specimen collection/handling, submission of specimen other than nasopharyngeal swab, presence of viral mutation(s) within the areas targeted by this assay, and inadequate number of viral copies(<138 copies/mL). A negative result must be combined with clinical observations, patient history, and epidemiological information. The expected result is Negative.  Fact Sheet for Patients:  BloggerCourse.com  Fact Sheet for Healthcare Providers:  SeriousBroker.it  This test is no t yet approved or cleared by the [place] FDA and  has been authorized for detection and/or diagnosis of SARS-CoV-2 by FDA under an Emergency Use Authorization (EUA). This EUA will remain  in effect (meaning this test can be used) for the duration of the COVID-19 declaration under Section 564(b)(1) of the Act, 21 U.S.C.section 360bbb-3(b)(1),  unless the authorization is terminated  or revoked sooner.       Influenza A by PCR NEGATIVE NEGATIVE Final   Influenza B by PCR NEGATIVE NEGATIVE Final    Comment: (NOTE) The Xpert Xpress SARS-CoV-2/FLU/RSV plus assay is intended as an aid in the diagnosis of influenza from Nasopharyngeal swab specimens and should not be used as a sole basis  for treatment. Nasal washings and aspirates are unacceptable for Xpert Xpress SARS-CoV-2/FLU/RSV testing.  Fact Sheet for Patients: https://www.fda.gov/media/152166/download  Fact Sheet for Healthcare Providers: SeriousBroker.ithttps://www.fda.gov/media/152162/download  This test is not yet approved or cleared by the [place] FDA and has been authorized for detection and/or diagnosis of SARS-CoV-2 by FDA under an Emergency Use Authorization (EUA). This EUA will remain in effect (meaning this test can be used) for the duration of the COVID-19 declaration under Section 564(b)(1) of the ABloggerCourse.comct, 21 U.S.C. section 360bbb-3(b)(1), unless the authorization is terminated or revoked.  Performed at [facility], [address], [place], [place] [address]   Urine culture     Status: None   Collection Time: 03/26/21  5:23 PM   Specimen: In/Out Cath Urine  Result Value Ref Range Status   Specimen Description   Final    IN/OUT CATH URINE Performed at [facility], [address], [place], [place] [address]    Special Requests   Final    NONE Performed at [facility], [address], [place], [place] [address]    Culture   Final    NO GROWTH Performed at [facility], [address] [place], [place] [address]    Report Status 03/28/2021 FINAL  Final  Blood Culture (routine x 2)     Status: None   Collection Time: 03/26/21  5:30 PM   Specimen: BLOOD  Result Value Ref Range Status   Specimen Description BLOOD RIGHT ANTECUBITAL  Final   Special Requests   Final    BOTTLES DRAWN AEROBIC AND ANAEROBIC Blood Culture adequate volume   Culture   Final    NO GROWTH 5 DAYS Performed at [facility], [address] [place], [place] [address]    Report Status 03/31/2021 FINAL  Final  Blood Culture (routine x 2)     Status: None   Collection Time: 03/26/21  5:58 PM   Specimen: BLOOD  Result Value Ref Range Status   Specimen  Description   Final    BLOOD LEFT ANTECUBITAL Performed at [facility], [address], [place], [place] [address]    Special Requests   Final    Blood Culture adequate volume BOTTLES DRAWN AEROBIC AND ANAEROBIC Performed at [facility], [address], [place], [place] [address]    Culture   Final    NO GROWTH 5 DAYS Performed at [facility], [address] [place], [place] [address]    Report Status 03/31/2021 FINAL  Final  MRSA PCR Screening     Status: None   Collection Time: 03/29/21 10:00 PM  Result Value Ref Range Status   MRSA by PCR NEGATIVE NEGATIVE Final    Comment:        The GeneXpert MRSA Assay (FDA approved for NASAL specimens only), is one component of a comprehensive MRSA colonization surveillance program. It is not intended to diagnose MRSA infection nor to guide or monitor treatment for MRSA infections. Performed at [facility], [address] [place], [place] [address]  Pertinent Lab seen by me: CBC Latest Ref Rng & Units 04/03/2021 04/02/2021 03/30/2021  WBC 4.0 - 10.5 K/uL 13.4(H) 13.1(H) 11.5(H)  Hemoglobin 12.0 - 15.0 g/dL 10.7(L) 10.8(L) 10.9(L)  Hematocrit 36.0 - 46.0 % 35.0(L) 35.1(L) 36.2  Platelets 150 - 400 K/uL 342 358 377   CMP Latest Ref Rng & Units 04/03/2021 04/02/2021 03/30/2021  Glucose 70 - 99 mg/dL 149(F) 026(V) 785(Y)  BUN 8 - 23 mg/dL 85(O) 27(X) 22  Creatinine 0.44 - 1.00 mg/dL 4.12 8.78 6.76(H)  Sodium 135 - 145 mmol/L 136 136 138  Potassium 3.5 - 5.1 mmol/L 3.4(L) 3.7 3.6  Chloride 98 - 111 mmol/L 98 98 103  CO2 22 - 32 mmol/L 28 29 28   Calcium 8.9 - 10.3 mg/dL 9.8 9.5 9.3  Total Protein 6.5 - 8.1 g/dL - - -  Total Bilirubin 0.3 - 1.2 mg/dL - - -  Alkaline Phos 38 - 126 U/L - - -  AST 15 - 41 U/L - - -  ALT 0 - 44 U/L - - -     Pertinent Imagings/Other Imagings Plain films and CT images have been personally visualized and interpreted; radiology  reports have been reviewed. Decision making incorporated into the Impression / Recommendations.  LLE 03/29/21 FINDINGS: Targeted sonographic evaluation in the area of clinical concern labeled left foot dorsal aspect. There is diffuse and prominent subcutaneous edema but no focal fluid collection. Mild scattered soft tissue vascularity without any peripherally enhancing foci. No sonographic evidence of radiopaque foreign body.   IMPRESSION: Prominent subcutaneous edema without sonographic evidence of abscess or focal fluid collection.  Left Foot Xray 03/27/21 FINDINGS: Considerable soft tissue swelling is noted over the dorsum of the foot in the region the metatarsals consistent with the given clinical history. Tarsal degenerative change and calcaneal spurring is noted. No acute fracture or dislocation is seen. No radiopaque foreign body is noted.   IMPRESSION: Changes consistent with the given clinical history of dorsal cellulitis. No definitive findings to suggest osteomyelitis are noted.  I have spent more than 70  minutes for this patient encounter including review of prior medical records with greater than 50% of time being face to face and coordination of their care.  Electronically signed by:   03/29/21, MD Infectious Disease Physician [facility] Pager: [phone]

## 2021-04-03 NOTE — Progress Notes (Signed)
Report given to [facility] room 1308 nurse. And pt transferred to [facility].

## 2021-04-03 NOTE — Plan of Care (Signed)
  Problem: Activity: Goal: Risk for activity intolerance will decrease Outcome: Progressing   Problem: Safety: Goal: Ability to remain free from injury will improve Outcome: Progressing   Problem: Skin Integrity: Goal: Risk for impaired skin integrity will decrease Outcome: Progressing   Problem: Pain Managment: Goal: General experience of comfort will improve Outcome: Progressing   

## 2021-04-03 NOTE — Progress Notes (Signed)
Pharmacy Antibiotic Note  Melanie Reeves is a 70 y.o. female admitted on 03/26/2021 with sepsis 2/2 nonpurulent cellulitis.  Pharmacy has been consulted for cefazolin dosing.  Patient presented with foot swelling, failed outpatient doxycycline which does not have adequate strep coverage.   Today, 04/03/21 WBC slightly elevated  SCr WNL & stable. CrCl ~80 mL/min CRP: 9.3 > 6.3 > 5.3  Today is day #9 of IV antibiotics.   Plan: Continue cefazolin 2 g IV q8h  Renal function has been stable; cultures negative. Pharmacy to sign off. Please re-consult if needed.   Height: 5' 2.4" (158.5 cm) Weight: 130.8 kg (288 lb 4.8 oz) IBW/kg (Calculated) : 51.02  Temp (24hrs), Avg:98 F (36.7 C), Min:98 F (36.7 C), Max:98 F (36.7 C)  Recent Labs  Lab 03/28/21 0318 03/30/21 0331 04/02/21 0333 04/03/21 0310  WBC 13.1* 11.5* 13.1* 13.4*  CREATININE 0.99 1.03* 0.91 0.89     Estimated Creatinine Clearance: 80.3 mL/min (by C-G formula based on SCr of 0.89 mg/dL).    Allergies  Allergen Reactions   Simvastatin     Other reaction(s): leg weakness Other reaction(s): leg weakness   Sulfa Antibiotics Other (See Comments)    Cant remember Other reaction(s): as a child    Vancomycin 6/14 > 6/16 Cefepime 6/14 > 6/16 Metronidazole x1 on 6/14 Cefazolin 6/16 >>  6/14 BCx: ngF 6/14 UCx: ngF  Cindi Carbon, PharmD 04/03/21 8:50 AM

## 2021-04-03 NOTE — Progress Notes (Signed)
Patient ID: Melanie Reeves, female   DOB: 10-27-1950, 70 y.o.   MRN: [id number]  PROGRESS NOTE    Yakelin Grenier Dilks  TWS:[id number] DOB: Aug 08, 1951 DOA: 03/26/2021 PCP: Vernie Shanks, MD   Brief Narrative:  70 y.o. female with medical history significant for chronic diastolic heart failure, paroxysmal atrial fibrillation chronically anticoagulated on Xarelto, type 2 diabetes mellitus presented with worsening left lower extremity swelling and redness despite being treated with oral doxycycline as an outpatient.  On presentation, she had leukocytosis.  She was started on broad-spectrum antibiotics for cellulitis.  Assessment & Plan:   Left lower extremity cellulitis -Failed outpatient treatment.  X-ray of the foot did not show any osteomyelitis.  Cultures negative so far -Initially started on vancomycin and cefepime which were switched to IV Ancef. -Erythema improving but still has significant erythema and some warmth on the foot.  Has been on Ancef IV   No evidence of abscess or fluid collection on soft tissue ultrasound done on 03/29/2021, persistent leukocytosis, elevated ESR CRP, will consult ID -Encourage elevate legs  Chronic venous insufficiency -Resume elastic stockings at home. -Advised elevation of legs whenever she is sitting down.  Diabetes mellitus type 2 with hyperglycemia -Continue Lantus along with CBGs with SSI.  Add NovoLog with meals.  Paroxysmal A. fib -Currently rate controlled.  Continue Xarelto  Acute on chronic diastolic heart failure -Strict input and output.  Daily weights.  Fluid restriction.  Reportedly on diuretics q. weekly. -Continue Lasix 60 mg IV daily.  Allergic rhinitis -Continue loratadine  Anxiety/depression --continue duloxetine  Dyslipidemia -Continue fenofibrate and pravastatin  Class III obesity  Body mass index is 52.06 kg/m.   DVT prophylaxis: Xarelto Code Status: Full Family Communication: None at bedside Disposition Plan: Status  is: Inpatient  Remains inpatient appropriate because:Inpatient level of care appropriate due to severity of illness  Dispo: The patient is from: Home              Anticipated d/c is to: Home              Patient currently is not medically stable to d/c.   Difficult to place patient No  Consultants: ID  Procedures: None  Antimicrobials:  Anti-infectives (From admission, onward)    Start     Dose/Rate Route Frequency Ordered Stop   03/28/21 1400  ceFAZolin (ANCEF) IVPB 2g/100 mL premix        2 g 200 mL/hr over 30 Minutes Intravenous Every 8 hours 03/28/21 0947     03/27/21 0600  vancomycin (VANCOCIN) IVPB 1000 mg/200 mL premix  Status:  Discontinued        1,000 mg 200 mL/hr over 60 Minutes Intravenous Every 12 hours 03/26/21 1807 03/28/21 0947   03/27/21 0200  ceFEPIme (MAXIPIME) 2 g in sodium chloride 0.9 % 100 mL IVPB  Status:  Discontinued        2 g 200 mL/hr over 30 Minutes Intravenous Every 8 hours 03/26/21 1742 03/28/21 0947   03/26/21 1845  vancomycin (VANCOCIN) IVPB 1000 mg/200 mL premix       See Hyperspace for full Linked Orders Report.   1,000 mg 200 mL/hr over 60 Minutes Intravenous  Once 03/26/21 1739 03/27/21 1959   03/26/21 1745  vancomycin (VANCOCIN) IVPB 1000 mg/200 mL premix       See Hyperspace for full Linked Orders Report.   1,000 mg 200 mL/hr over 60 Minutes Intravenous  Once 03/26/21 1739 03/26/21 2052   03/26/21 1715  ceFEPIme (MAXIPIME)  2 g in sodium chloride 0.9 % 100 mL IVPB        2 g 200 mL/hr over 30 Minutes Intravenous  Once 03/26/21 1705 03/26/21 1955   03/26/21 1715  metroNIDAZOLE (FLAGYL) IVPB 500 mg        500 mg 100 mL/hr over 60 Minutes Intravenous  Once 03/26/21 1705 03/26/21 1955   03/26/21 1715  vancomycin (VANCOCIN) IVPB 1000 mg/200 mL premix  Status:  Discontinued        1,000 mg 200 mL/hr over 60 Minutes Intravenous  Once 03/26/21 1705 03/26/21 1739        Subjective: Patient seen and examined at bedside.  Denies worsening  shortness of breath, fever or vomiting.  Still feels that her foot is significantly red with some pain.   Objective: Vitals:   04/02/21 1340 04/02/21 2147 04/03/21 0508 04/03/21 1053  BP: (!) 147/85 125/72 (!) 148/88 (!) 134/93  Pulse: 93 94 86 86  Resp: $Remo'16 17 18 18  'krUsy$ Temp:  98 F (36.7 C) 98 F (36.7 C) 97.6 F (36.4 C)  TempSrc:  Oral Oral Oral  SpO2: 96% 92% 96% 95%  Weight:      Height:        Intake/Output Summary (Last 24 hours) at 04/03/2021 1105 Last data filed at 04/03/2021 0600 Gross per 24 hour  Intake 860 ml  Output --  Net 860 ml   Filed Weights   03/31/21 0600 04/01/21 0653 04/02/21 0548  Weight: 127 kg 132.5 kg 130.8 kg    Examination:  General exam: On room air currently.  No distress. Respiratory system: Decreased breath sounds at bases bilaterally, no wheezing  cardiovascular system: Rate controlled, S1-S2 heard gastrointestinal system: Abdomen is morbidly obese, slightly distended, soft and nontender.  Bowel sounds are heard  extremities: Bilateral lower extremity edema present; no cyanosis  Central nervous system: Awake and oriented.  No focal neurological deficits.  Moves extremities skin: Left foot still erythematous with some swelling and warmth.  No obvious wound/discharge  psychiatry: Patient is calm, cooperative and pleasant  Data Reviewed: I have personally reviewed following labs and imaging studies  CBC: Recent Labs  Lab 03/28/21 0318 03/30/21 0331 04/02/21 0333 04/03/21 0310  WBC 13.1* 11.5* 13.1* 13.4*  NEUTROABS  --  6.6 8.5* 9.0*  HGB 11.1* 10.9* 10.8* 10.7*  HCT 36.4 36.2 35.1* 35.0*  MCV 89.2 89.6 89.1 89.1  PLT 307 377 358 628   Basic Metabolic Panel: Recent Labs  Lab 03/28/21 0318 03/30/21 0331 04/02/21 0333 04/03/21 0310  NA 140 138 136 136  K 3.7 3.6 3.7 3.4*  CL 107 103 98 98  CO2 $Re'24 28 29 28  'Tkr$ GLUCOSE 118* 225* 320* 318*  BUN 26* 22 29* 31*  CREATININE 0.99 1.03* 0.91 0.89  CALCIUM 9.8 9.3 9.5 9.8  MG 1.9  --   1.9 1.8   GFR: Estimated Creatinine Clearance: 80.3 mL/min (by C-G formula based on SCr of 0.89 mg/dL). Liver Function Tests: No results for input(s): AST, ALT, ALKPHOS, BILITOT, PROT, ALBUMIN in the last 168 hours. No results for input(s): LIPASE, AMYLASE in the last 168 hours. No results for input(s): AMMONIA in the last 168 hours. Coagulation Profile: No results for input(s): INR, PROTIME in the last 168 hours. Cardiac Enzymes: No results for input(s): CKTOTAL, CKMB, CKMBINDEX, TROPONINI in the last 168 hours. BNP (last 3 results) No results for input(s): PROBNP in the last 8760 hours. HbA1C: No results for input(s): HGBA1C in the last 72  hours. CBG: Recent Labs  Lab 04/02/21 0717 04/02/21 1105 04/02/21 1834 04/02/21 2145 04/03/21 0756  GLUCAP 204* 280* 270* 323* 189*   Lipid Profile: No results for input(s): CHOL, HDL, LDLCALC, TRIG, CHOLHDL, LDLDIRECT in the last 72 hours. Thyroid Function Tests: No results for input(s): TSH, T4TOTAL, FREET4, T3FREE, THYROIDAB in the last 72 hours. Anemia Panel: No results for input(s): VITAMINB12, FOLATE, FERRITIN, TIBC, IRON, RETICCTPCT in the last 72 hours. Sepsis Labs: No results for input(s): PROCALCITON, LATICACIDVEN in the last 168 hours.  Recent Results (from the past 240 hour(s))  Resp Panel by RT-PCR (Flu A&B, Covid) Nasopharyngeal Swab     Status: None   Collection Time: 03/26/21  5:23 PM   Specimen: Nasopharyngeal Swab; Nasopharyngeal(NP) swabs in vial transport medium  Result Value Ref Range Status   SARS Coronavirus 2 by RT PCR NEGATIVE NEGATIVE Final    Comment: (NOTE) SARS-CoV-2 target nucleic acids are NOT DETECTED.  The SARS-CoV-2 RNA is generally detectable in upper respiratory specimens during the acute phase of infection. The lowest concentration of SARS-CoV-2 viral copies this assay can detect is 138 copies/mL. A negative result does not preclude SARS-Cov-2 infection and should not be used as the sole basis  for treatment or other patient management decisions. A negative result may occur with  improper specimen collection/handling, submission of specimen other than nasopharyngeal swab, presence of viral mutation(s) within the areas targeted by this assay, and inadequate number of viral copies(<138 copies/mL). A negative result must be combined with clinical observations, patient history, and epidemiological information. The expected result is Negative.  Fact Sheet for Patients:  EntrepreneurPulse.com.au  Fact Sheet for Healthcare Providers:  IncredibleEmployment.be  This test is no t yet approved or cleared by the [place] FDA and  has been authorized for detection and/or diagnosis of SARS-CoV-2 by FDA under an Emergency Use Authorization (EUA). This EUA will remain  in effect (meaning this test can be used) for the duration of the COVID-19 declaration under Section 564(b)(1) of the Act, 21 U.S.C.section 360bbb-3(b)(1), unless the authorization is terminated  or revoked sooner.       Influenza A by PCR NEGATIVE NEGATIVE Final   Influenza B by PCR NEGATIVE NEGATIVE Final    Comment: (NOTE) The Xpert Xpress SARS-CoV-2/FLU/RSV plus assay is intended as an aid in the diagnosis of influenza from Nasopharyngeal swab specimens and should not be used as a sole basis for treatment. Nasal washings and aspirates are unacceptable for Xpert Xpress SARS-CoV-2/FLU/RSV testing.  Fact Sheet for Patients: EntrepreneurPulse.com.au  Fact Sheet for Healthcare Providers: IncredibleEmployment.be  This test is not yet approved or cleared by the [place] FDA and has been authorized for detection and/or diagnosis of SARS-CoV-2 by FDA under an Emergency Use Authorization (EUA). This EUA will remain in effect (meaning this test can be used) for the duration of the COVID-19 declaration under Section 564(b)(1) of the Act, 21  U.S.C. section 360bbb-3(b)(1), unless the authorization is terminated or revoked.  Performed at [facility], [address] [place], [place] [address]   Urine culture     Status: None   Collection Time: 03/26/21  5:23 PM   Specimen: In/Out Cath Urine  Result Value Ref Range Status   Specimen Description   Final    IN/OUT CATH URINE Performed at [facility], [address] [place], [place] [address]    Special Requests   Final    NONE Performed at [facility], [address], [place], [place]  [address]    Culture   Final    NO GROWTH Performed at [facility], [place] [address] [place], [place] [address]    Report Status 03/28/2021 FINAL  Final  Blood Culture (routine x 2)     Status: None   Collection Time: 03/26/21  5:30 PM   Specimen: BLOOD  Result Value Ref Range Status   Specimen Description BLOOD RIGHT ANTECUBITAL  Final   Special Requests   Final    BOTTLES DRAWN AEROBIC AND ANAEROBIC Blood Culture adequate volume   Culture   Final    NO GROWTH 5 DAYS Performed at [facility], [place] [address] [place],  [place] [address]    Report Status 03/31/2021 FINAL  Final  Blood Culture (routine x 2)     Status: None   Collection Time: 03/26/21  5:58 PM   Specimen: BLOOD  Result Value Ref Range Status   Specimen Description   Final    BLOOD LEFT ANTECUBITAL Performed at [facility], [address], [place], [place] [address]    Special Requests   Final    Blood Culture adequate volume BOTTLES DRAWN AEROBIC AND ANAEROBIC Performed at [facility], [address], [place], [place] [address]    Culture   Final    NO GROWTH 5 DAYS Performed at [facility], [place] [address], [place], [place] [address]    Report Status 03/31/2021 FINAL  Final  MRSA PCR Screening     Status: None   Collection Time: 03/29/21 10:00 PM  Result Value Ref Range Status   MRSA  by PCR NEGATIVE NEGATIVE Final    Comment:        The GeneXpert MRSA Assay (FDA approved for NASAL specimens only), is one component of a comprehensive MRSA colonization surveillance program. It is not intended to diagnose MRSA infection nor to guide or monitor treatment for MRSA infections. Performed at [facility], [place] [address] [place], [place] [address]           Radiology Studies: No results found.      Scheduled Meds:  DULoxetine  30 mg Oral Daily   DULoxetine  60 mg Oral Daily   fenofibrate  54 mg Oral Daily   fluticasone  2 spray Each Nare Daily   furosemide  60 mg Intravenous Daily   gabapentin  1,200 mg Oral BID   insulin aspart  0-9 Units Subcutaneous TID WC   insulin aspart  6 Units Subcutaneous TID WC   insulin glargine  90 Units Subcutaneous QHS   loratadine  10 mg Oral Daily   pravastatin  40 mg Oral Daily   rivaroxaban  20 mg Oral Q supper   Continuous Infusions:  sodium chloride 250 mL (04/03/21 0506)    ceFAZolin (ANCEF) IV 2 g (04/03/21 9758)          Florencia Reasons, MD PhD FACP [facility] 04/03/2021, 11:05 AM

## 2021-04-04 ENCOUNTER — Inpatient Hospital Stay (HOSPITAL_COMMUNITY): Payer: PPO

## 2021-04-04 DIAGNOSIS — L538 Other specified erythematous conditions: Secondary | ICD-10-CM

## 2021-04-04 DIAGNOSIS — M7989 Other specified soft tissue disorders: Secondary | ICD-10-CM

## 2021-04-04 DIAGNOSIS — M79605 Pain in left leg: Secondary | ICD-10-CM

## 2021-04-04 LAB — GLUCOSE, CAPILLARY
Glucose-Capillary: 205 mg/dL — ABNORMAL HIGH (ref 70–99)
Glucose-Capillary: 223 mg/dL — ABNORMAL HIGH (ref 70–99)
Glucose-Capillary: 255 mg/dL — ABNORMAL HIGH (ref 70–99)
Glucose-Capillary: 268 mg/dL — ABNORMAL HIGH (ref 70–99)

## 2021-04-04 LAB — CBC WITH DIFFERENTIAL/PLATELET
Abs Immature Granulocytes: 0.05 10*3/uL (ref 0.00–0.07)
Basophils Absolute: 0.1 10*3/uL (ref 0.0–0.1)
Basophils Relative: 1 %
Eosinophils Absolute: 0.2 10*3/uL (ref 0.0–0.5)
Eosinophils Relative: 2 %
HCT: 35.8 % — ABNORMAL LOW (ref 36.0–46.0)
Hemoglobin: 10.6 g/dL — ABNORMAL LOW (ref 12.0–15.0)
Immature Granulocytes: 0 %
Lymphocytes Relative: 28 %
Lymphs Abs: 3.6 10*3/uL (ref 0.7–4.0)
MCH: 26.8 pg (ref 26.0–34.0)
MCHC: 29.6 g/dL — ABNORMAL LOW (ref 30.0–36.0)
MCV: 90.6 fL (ref 80.0–100.0)
Monocytes Absolute: 0.8 10*3/uL (ref 0.1–1.0)
Monocytes Relative: 6 %
Neutro Abs: 8.1 10*3/uL — ABNORMAL HIGH (ref 1.7–7.7)
Neutrophils Relative %: 63 %
Platelets: 319 10*3/uL (ref 150–400)
RBC: 3.95 MIL/uL (ref 3.87–5.11)
RDW: 14.6 % (ref 11.5–15.5)
WBC: 12.8 10*3/uL — ABNORMAL HIGH (ref 4.0–10.5)
nRBC: 0 % (ref 0.0–0.2)

## 2021-04-04 LAB — BASIC METABOLIC PANEL
Anion gap: 10 (ref 5–15)
BUN: 28 mg/dL — ABNORMAL HIGH (ref 8–23)
CO2: 29 mmol/L (ref 22–32)
Calcium: 10 mg/dL (ref 8.9–10.3)
Chloride: 98 mmol/L (ref 98–111)
Creatinine, Ser: 0.79 mg/dL (ref 0.44–1.00)
GFR, Estimated: >60 mL/min (ref >60)
Glucose, Bld: 293 mg/dL — ABNORMAL HIGH (ref 70–99)
Potassium: 3.7 mmol/L (ref 3.5–5.1)
Sodium: 137 mmol/L (ref 135–145)

## 2021-04-04 LAB — CK: Total CK: 43 U/L (ref 38–234)

## 2021-04-04 MED ORDER — INSULIN ASPART 100 UNIT/ML IJ SOLN
10.0000 [IU] | Freq: Three times a day (TID) | INTRAMUSCULAR | Status: DC
  Administered 2021-04-04 – 2021-04-05 (×4): 10 [IU] via SUBCUTANEOUS

## 2021-04-04 MED ORDER — ATENOLOL 25 MG PO TABS
12.5000 mg | ORAL_TABLET | Freq: Every day | ORAL | Status: DC
  Administered 2021-04-05: 12.5 mg via ORAL
  Filled 2021-04-04: qty 1

## 2021-04-04 NOTE — Progress Notes (Signed)
Inpatient Diabetes Program Recommendations  AACE/ADA: New Consensus Statement on Inpatient Glycemic Control (2015)  Target Ranges:  Prepandial:   less than 140 mg/dL      Peak postprandial:   less than 180 mg/dL (1-2 hours)      Critically ill patients:  140 - 180 mg/dL   Lab Results  Component Value Date   GLUCAP 205 (H) 04/04/2021   HGBA1C 6.5 03/19/2021    Review of Glycemic Control Results for Melanie Reeves, Melanie Reeves (MRN [id number]) as of 04/04/2021 10:07  Ref. Range 04/03/2021 07:56 04/03/2021 12:15 04/03/2021 16:46 04/03/2021 22:05 04/04/2021 07:40  Glucose-Capillary Latest Ref Range: 70 - 99 mg/dL 324 (H) 401 (H) 027 (H) 309 (H) 205 (H)  Home DM Meds: Novolog 20-40 units TID                              Toujeo 150 units Daily                              Invokana 300 mg Daily                              Metformin 2000 mg QPM                              Victoza 1.8 mg Daily     Current Orders: Lantus 90 units QHS               Novolog 0-9 units TID               Novolog 6 units tid with meals   Inpatient Diabetes Program Recommendations:    Please increase Novolog to 10 units tid with meals.   Thanks,  Beryl Meager, RN, BC-ADM Inpatient Diabetes Coordinator Pager [phone]  (8a-5p)

## 2021-04-04 NOTE — Progress Notes (Signed)
LLE venous duplex has been completed.  Results can be found under chart review under CV PROC. 04/04/2021 2:19 PM Cyndi Montejano RVT, RDMS

## 2021-04-04 NOTE — Progress Notes (Signed)
[facility] Infectious Diseases Follow Up Note  Patient Identification: Patient Name: Melanie Reeves MRN: [id number] Admit Date: 03/26/2021  4:00 PM Age: 70 y.o.Today's Date: 04/04/2021   Reason for Visit: Left foot cellulitis  Principal Problem:   Cellulitis of left lower extremity Active Problems:   Paroxysmal atrial fibrillation (HCC)   Hyperlipidemia   Diabetes mellitus (HCC)   Left foot pain   Chronic diastolic CHF (congestive heart failure) (HCC)  Antibiotics: Vancomycin 6/14-6/15, cefazolin 6/16-6-22, Daptomycin 6/22-current                    Cefepime 6/14-6/15                    Metronidazole 6/14   Lines/Tubes: PIV's  Interval Events: Afebrile, MRI with no evidence of left foot osteomyelitis or abscess  Assessment Left foot cellulitis Bilateral chronic leg swelling in the setting of chronic venous insufficiency Obesity/OSA Congestive diastolic heart failure  Recommendations Continue daptomycin, monitor CPK Given her morbid obesity, chronic venous insufficiency, it will probably take a little longer for her redness/tenderness to improve Keep legs elevated Ultrasound left leg to rule out DVT She will need evaluation with vascular surgery to address her venous insufficiency  Rest of the management as per the primary team. Thank you for the consult. Please page with pertinent questions or concerns.  ______________________________________________________________________ Subjective patient seen and examined at the bedside.  Does not have any complaints today   Vitals BP (!) 112/55 (BP Location: Left Arm)   Pulse 89   Temp 97.8 F (36.6 C) (Oral)   Resp 18   Ht 5' 2.4" (1.585 m)   Wt 130.5 kg   SpO2 98%   BMI 51.95 kg/m   Physical Exam Constitutional: Morbidly obese female, not in acute distress    Comments:   Cardiovascular:     Rate and Rhythm: Normal rate and regular rhythm.     Heart sounds:    Pulmonary:     Effort: Pulmonary effort is normal.     Comments:   Abdominal:     Palpations: Abdomen is soft.     Tenderness: Obese, nontender  Musculoskeletal:        General: Chronic bilateral leg swelling, left dorsal foot redness-seems to be somewhat improving than yesterday.   Skin:    Comments: No lesions or rashes  Neurological:     General: No focal deficit present.   Psychiatric:        Mood and Affect: Mood normal.   Pertinent Microbiology Results for orders placed or performed during the hospital encounter of 03/26/21  Resp Panel by RT-PCR (Flu A&B, Covid) Nasopharyngeal Swab     Status: None   Collection Time: 03/26/21  5:23 PM   Specimen: Nasopharyngeal Swab; Nasopharyngeal(NP) swabs in vial transport medium  Result Value Ref Range Status   SARS Coronavirus 2 by RT PCR NEGATIVE NEGATIVE Final    Comment: (NOTE) SARS-CoV-2 target nucleic acids are NOT DETECTED.  The SARS-CoV-2 RNA is generally detectable in upper respiratory specimens during the acute phase of infection. The lowest concentration of SARS-CoV-2 viral copies this assay can detect is 138 copies/mL. A negative result does not preclude SARS-Cov-2 infection and should not be used as the sole basis for treatment or other patient management decisions. A negative result may occur with  improper specimen collection/handling, submission of specimen other than nasopharyngeal swab, presence of viral mutation(s) within the areas targeted by this assay, and inadequate number of viral  copies(<138 copies/mL). A negative result must be combined with clinical observations, patient history, and epidemiological information. The expected result is Negative.  Fact Sheet for Patients:  BloggerCourse.com  Fact Sheet for Healthcare Providers:  SeriousBroker.it  This test is no t yet approved or cleared by the [place] FDA and  has been authorized for  detection and/or diagnosis of SARS-CoV-2 by FDA under an Emergency Use Authorization (EUA). This EUA will remain  in effect (meaning this test can be used) for the duration of the COVID-19 declaration under Section 564(b)(1) of the Act, 21 U.S.C.section 360bbb-3(b)(1), unless the authorization is terminated  or revoked sooner.       Influenza A by PCR NEGATIVE NEGATIVE Final   Influenza B by PCR NEGATIVE NEGATIVE Final    Comment: (NOTE) The Xpert Xpress SARS-CoV-2/FLU/RSV plus assay is intended as an aid in the diagnosis of influenza from Nasopharyngeal swab specimens and should not be used as a sole basis for treatment. Nasal washings and aspirates are unacceptable for Xpert Xpress SARS-CoV-2/FLU/RSV testing.  Fact Sheet for Patients: BloggerCourse.com  Fact Sheet for Healthcare Providers: SeriousBroker.it  This test is not yet approved or cleared by the [place] FDA and has been authorized for detection and/or diagnosis of SARS-CoV-2 by FDA under an Emergency Use Authorization (EUA). This EUA will remain in effect (meaning this test can be used) for the duration of the COVID-19 declaration under Section 564(b)(1) of the Act, 21 U.S.C. section 360bbb-3(b)(1), unless the authorization is terminated or revoked.  Performed at [facility], [address], [place], [place] [address]   Urine culture     Status: None   Collection Time: 03/26/21  5:23 PM   Specimen: In/Out Cath Urine  Result Value Ref Range Status   Specimen Description   Final    IN/OUT CATH URINE Performed at [facility], [address] [place], [place] [address]    Special Requests   Final    NONE Performed at [facility], [address], [place], [place] [address]    Culture   Final    NO GROWTH Performed at [facility], [address], [place], [place] [address]    Report  Status 03/28/2021 FINAL  Final  Blood Culture (routine x 2)     Status: None   Collection Time: 03/26/21  5:30 PM   Specimen: BLOOD  Result Value Ref Range Status   Specimen Description BLOOD RIGHT ANTECUBITAL  Final   Special Requests   Final    BOTTLES DRAWN AEROBIC AND ANAEROBIC Blood Culture adequate volume   Culture   Final    NO GROWTH 5 DAYS Performed at [facility], [address] [place], [place] [address]    Report Status 03/31/2021 FINAL  Final  Blood Culture (routine x 2)     Status: None   Collection Time: 03/26/21  5:58 PM   Specimen: BLOOD  Result Value Ref Range Status   Specimen Description   Final    BLOOD LEFT ANTECUBITAL Performed at [facility], [address] [place], [place] [address]    Special Requests   Final    Blood Culture adequate volume BOTTLES DRAWN AEROBIC AND ANAEROBIC Performed at [facility], [address] [place], [place] [address]    Culture   Final    NO GROWTH 5 DAYS Performed at [facility], [address] [place], [place] [address]    Report Status 03/31/2021 FINAL  Final  MRSA PCR Screening     Status: None   Collection Time: 03/29/21 10:00 PM  Result Value Ref Range Status   MRSA by PCR NEGATIVE NEGATIVE Final    Comment:        The GeneXpert MRSA Assay (FDA approved for NASAL specimens only), is one component of a comprehensive MRSA colonization surveillance program. It is not intended to diagnose MRSA infection nor to guide or monitor treatment for MRSA infections. Performed at [facility], [address], [place], [place] [address]     Pertinent Lab. CBC Latest Ref Rng & Units 04/03/2021 04/02/2021 03/30/2021  WBC 4.0 - 10.5 K/uL 13.4(H) 13.1(H) 11.5(H)  Hemoglobin 12.0 - 15.0 g/dL 10.7(L) 10.8(L) 10.9(L)  Hematocrit 36.0 - 46.0 % 35.0(L) 35.1(L) 36.2  Platelets 150 - 400 K/uL 342 358 377   CMP Latest Ref Rng & Units 04/04/2021 04/03/2021  04/02/2021  Glucose 70 - 99 mg/dL 476(L) 465(K) 354(S)  BUN 8 - 23 mg/dL 56(C) 12(X) 51(Z)  Creatinine 0.44 - 1.00 mg/dL 0.01 7.49 4.49  Sodium 135 - 145 mmol/L 137 136 136  Potassium 3.5 - 5.1 mmol/L 3.7 3.4(L) 3.7  Chloride 98 - 111 mmol/L 98 98 98  CO2 22 - 32 mmol/L 29 28 29   Calcium 8.9 - 10.3 mg/dL 9.8 9.5  Total Protein 6.5 - 8.1 g/dL - - -  Total Bilirubin 0.3 - 1.2 mg/dL - - -  Alkaline Phos 38 - 126 U/L - - -  AST 15 - 41 U/L - - -  ALT 0 - 44 U/L - - -     Pertinent Imaging today Plain films and CT images have been personally visualized and interpreted; radiology reports have been reviewed. Decision making incorporated into the Impression / Recommendations.  I have spent more than 35 minutes for this patient encounter including review of prior medical records, coordination of care  with greater than 50% of time being face to face/counseling and discussing diagnostics/treatment plan with the patient/family.  Electronically signed by:   [email or web address], MD Infectious Disease Physician [facility] Pager: [phone]

## 2021-04-04 NOTE — Progress Notes (Signed)
Patient ID: Melanie Reeves, female   DOB: [birth year], 70 y.o.   MRN: [id number]  PROGRESS NOTE    Melanie Reeves  TDV:[id number] DOB: 11-Oct-1951 DOA: 03/26/2021 PCP: Melanie Shanks, MD   Brief Narrative:  70 y.o. female with medical history significant for chronic diastolic heart failure, paroxysmal atrial fibrillation chronically anticoagulated on Xarelto, type 2 diabetes mellitus presented with worsening left lower extremity swelling and redness despite being treated with oral doxycycline as an outpatient.  On presentation, she had leukocytosis.  She was started on broad-spectrum antibiotics for cellulitis.  Assessment & Plan:   Left lower extremity cellulitis -Failed outpatient treatment.  -   -Initially started on vancomycin/ cefepime/flagyl in the ED which were switched to IV Ancef. -Erythema improving but still has significant erythema despite many days of  IV Ancef  -   No evidence of abscess or fluid collection on soft tissue ultrasound done on 03/29/2021, MRI left foot no osteomyelitis.Cultures negative so far -persistent leukocytosis, elevated ESR CRP, consulted ID on 6/22 who recommended daptomycin  -Encourage elevate legs -Venous doppler to rule out DVT   Chronic venous insufficiency -Resume elastic stockings at home. -Advised elevation of legs whenever she is sitting down. -vein specialist follow up after discharge  Insulin-dependent diabetes mellitus type 2, uncontrolled, with peripheral neuropathy -Presented with hypoglycemia, blood glucose 57 in the ED initially, hypoglycemia has resolved, now hyperglycemia -A1c 6.5% on 03/19/2021 --Continue Lantus along with CBGs with SSI.  Increase NovoLog with meals.  Paroxysmal A. Fib -sinus rhythm with chronic left bundle branch block -Home medication atenolol was held since admission, resume at lower dose and titrate - Continue Xarelto  Acute on chronic diastolic heart failure -Strict input and output.  Daily weights.  Fluid  restriction.  Reportedly on diuretics q. weekly. -Continue Lasix 60 mg IV daily.  Hypokalemia Replace K, check mag  Allergic rhinitis -Continue loratadine  Anxiety/depression --continue duloxetine  Dyslipidemia -Continue fenofibrate and pravastatin  Class III obesity  Body mass index is 51.95 kg/m.   DVT prophylaxis: Xarelto Code Status: Full Family Communication: None at bedside Disposition Plan: Status is: Inpatient  Remains inpatient appropriate because:Inpatient level of care appropriate due to severity of illness  Dispo: The patient is from: Home              Anticipated d/c is to: Home              Patient currently is not medically stable to d/c.,  Need ID clearance   Difficult to place patient No  Consultants: ID  Procedures: None  Antimicrobials:  Anti-infectives (From admission, onward)    Start     Dose/Rate Route Frequency Ordered Stop   04/03/21 2000  DAPTOmycin (CUBICIN) 350 mg in sodium chloride 0.9 % IVPB        4 mg/kg  82.9 kg (Adjusted) 114 mL/hr over 30 Minutes Intravenous Daily 04/03/21 1512     03/28/21 1400  ceFAZolin (ANCEF) IVPB 2g/100 mL premix  Status:  Discontinued        2 g 200 mL/hr over 30 Minutes Intravenous Every 8 hours 03/28/21 0947 04/03/21 1512   03/27/21 0600  vancomycin (VANCOCIN) IVPB 1000 mg/200 mL premix  Status:  Discontinued        1,000 mg 200 mL/hr over 60 Minutes Intravenous Every 12 hours 03/26/21 1807 03/28/21 0947   03/27/21 0200  ceFEPIme (MAXIPIME) 2 g in sodium chloride 0.9 % 100 mL IVPB  Status:  Discontinued  2 g 200 mL/hr over 30 Minutes Intravenous Every 8 hours 03/26/21 1742 03/28/21 0947   03/26/21 1845  vancomycin (VANCOCIN) IVPB 1000 mg/200 mL premix       See Hyperspace for full Linked Orders Report.   1,000 mg 200 mL/hr over 60 Minutes Intravenous  Once 03/26/21 1739 03/27/21 1959   03/26/21 1745  vancomycin (VANCOCIN) IVPB 1000 mg/200 mL premix       See Hyperspace for full Linked Orders  Report.   1,000 mg 200 mL/hr over 60 Minutes Intravenous  Once 03/26/21 1739 03/26/21 2052   03/26/21 1715  ceFEPIme (MAXIPIME) 2 g in sodium chloride 0.9 % 100 mL IVPB        2 g 200 mL/hr over 30 Minutes Intravenous  Once 03/26/21 1705 03/26/21 1955   03/26/21 1715  metroNIDAZOLE (FLAGYL) IVPB 500 mg        500 mg 100 mL/hr over 60 Minutes Intravenous  Once 03/26/21 1705 03/26/21 1955   03/26/21 1715  vancomycin (VANCOCIN) IVPB 1000 mg/200 mL premix  Status:  Discontinued        1,000 mg 200 mL/hr over 60 Minutes Intravenous  Once 03/26/21 1705 03/26/21 1739        Subjective: Patient seen and examined at bedside.   Having venous doppler done She Denies worsening shortness of breath, fever or vomiting.   Still feels that her foot is significantly red with some pain.    Objective: Vitals:   04/03/21 1744 04/03/21 2020 04/04/21 0500 04/04/21 0542  BP: (!) 147/74 (!) 124/58  (!) 112/55  Pulse: 89 91  89  Resp: '18 18  18  ' Temp: 98.2 F (36.8 C) 97.9 F (36.6 C)  97.8 F (36.6 C)  TempSrc: Oral Oral  Oral  SpO2: 97% 100%  98%  Weight:   130.5 kg   Height:        Intake/Output Summary (Last 24 hours) at 04/04/2021 1330 Last data filed at 04/04/2021 1010 Gross per 24 hour  Intake 2023.18 ml  Output 1500 ml  Net 523.18 ml   Filed Weights   04/01/21 0653 04/02/21 0548 04/04/21 0500  Weight: 132.5 kg 130.8 kg 130.5 kg    Examination:  General exam: On room air currently.  No distress. Respiratory system: Decreased breath sounds at bases bilaterally, no wheezing  cardiovascular system: Rate controlled, S1-S2 heard gastrointestinal system: Abdomen is morbidly obese, slightly distended, soft and nontender.  Bowel sounds are heard  extremities: Bilateral lower extremity edema present; no cyanosis  Central nervous system: Awake and oriented.  No focal neurological deficits.  Moves extremities skin: Left foot still erythematous with some swelling and warmth.  Appears  slightly better, No obvious wound/discharge  psychiatry: Patient is calm, cooperative and pleasant  Data Reviewed: I have personally reviewed following labs and imaging studies  CBC: Recent Labs  Lab 03/30/21 0331 04/02/21 0333 04/03/21 0310 04/04/21 0453  WBC 11.5* 13.1* 13.4* 12.8*  NEUTROABS 6.6 8.5* 9.0* 8.1*  HGB 10.9* 10.8* 10.7* 10.6*  HCT 36.2 35.1* 35.0* 35.8*  MCV 89.6 89.1 89.1 90.6  PLT 377 358 342 881   Basic Metabolic Panel: Recent Labs  Lab 03/30/21 0331 04/02/21 0333 04/03/21 0310 04/04/21 0453  NA 138 136 136 137  K 3.6 3.7 3.4* 3.7  CL 103 98 98 98  CO2 '28 29 28 29  ' GLUCOSE 225* 320* 318* 293*  BUN 22 29* 31* 28*  CREATININE 1.03* 0.91 0.89 0.79  CALCIUM 9.3 9.5 9.8 10.0  MG  --  1.9 1.8  --    GFR: Estimated Creatinine Clearance: 89.2 mL/min (by C-G formula based on SCr of 0.79 mg/dL). Liver Function Tests: No results for input(s): AST, ALT, ALKPHOS, BILITOT, PROT, ALBUMIN in the last 168 hours. No results for input(s): LIPASE, AMYLASE in the last 168 hours. No results for input(s): AMMONIA in the last 168 hours. Coagulation Profile: No results for input(s): INR, PROTIME in the last 168 hours. Cardiac Enzymes: Recent Labs  Lab 04/04/21 0453  CKTOTAL 43   BNP (last 3 results) No results for input(s): PROBNP in the last 8760 hours. HbA1C: No results for input(s): HGBA1C in the last 72 hours. CBG: Recent Labs  Lab 04/03/21 0756 04/03/21 1215 04/03/21 1646 04/03/21 2205 04/04/21 0740  GLUCAP 189* 186* 329* 309* 205*   Lipid Profile: No results for input(s): CHOL, HDL, LDLCALC, TRIG, CHOLHDL, LDLDIRECT in the last 72 hours. Thyroid Function Tests: No results for input(s): TSH, T4TOTAL, FREET4, T3FREE, THYROIDAB in the last 72 hours. Anemia Panel: No results for input(s): VITAMINB12, FOLATE, FERRITIN, TIBC, IRON, RETICCTPCT in the last 72 hours. Sepsis Labs: No results for input(s): PROCALCITON, LATICACIDVEN in the last 168  hours.  Recent Results (from the past 240 hour(s))  Resp Panel by RT-PCR (Flu A&B, Covid) Nasopharyngeal Swab     Status: None   Collection Time: 03/26/21  5:23 PM   Specimen: Nasopharyngeal Swab; Nasopharyngeal(NP) swabs in vial transport medium  Result Value Ref Range Status   SARS Coronavirus 2 by RT PCR NEGATIVE NEGATIVE Final    Comment: (NOTE) SARS-CoV-2 target nucleic acids are NOT DETECTED.  The SARS-CoV-2 RNA is generally detectable in upper respiratory specimens during the acute phase of infection. The lowest concentration of SARS-CoV-2 viral copies this assay can detect is 138 copies/mL. A negative result does not preclude SARS-Cov-2 infection and should not be used as the sole basis for treatment or other patient management decisions. A negative result may occur with  improper specimen collection/handling, submission of specimen other than nasopharyngeal swab, presence of viral mutation(s) within the areas targeted by this assay, and inadequate number of viral copies(<138 copies/mL). A negative result must be combined with clinical observations, patient history, and epidemiological information. The expected result is Negative.  Fact Sheet for Patients:  EntrepreneurPulse.com.au  Fact Sheet for Healthcare Providers:  IncredibleEmployment.be  This test is no t yet approved or cleared by the [place] FDA and  has been authorized for detection and/or diagnosis of SARS-CoV-2 by FDA under an Emergency Use Authorization (EUA). This EUA will remain  in effect (meaning this test can be used) for the duration of the COVID-19 declaration under Section 564(b)(1) of the Act, 21 U.S.C.section 360bbb-3(b)(1), unless the authorization is terminated  or revoked sooner.       Influenza A by PCR NEGATIVE NEGATIVE Final   Influenza B by PCR NEGATIVE NEGATIVE Final    Comment: (NOTE) The Xpert Xpress SARS-CoV-2/FLU/RSV plus assay is intended  as an aid in the diagnosis of influenza from Nasopharyngeal swab specimens and should not be used as a sole basis for treatment. Nasal washings and aspirates are unacceptable for Xpert Xpress SARS-CoV-2/FLU/RSV testing.  Fact Sheet for Patients: EntrepreneurPulse.com.au  Fact Sheet for Healthcare Providers: IncredibleEmployment.be  This test is not yet approved or cleared by the [place] FDA and has been authorized for detection and/or diagnosis of SARS-CoV-2 by FDA under an Emergency Use Authorization (EUA). This EUA will remain in effect (meaning this test can be used)  for the duration of the COVID-19 declaration under Section 564(b)(1) of the Act, 21 U.S.C. section 360bbb-3(b)(1), unless the authorization is terminated or revoked.  Performed at [facility], [address], [place], [place] [address]   Urine culture     Status: None   Collection Time: 03/26/21  5:23 PM   Specimen: In/Out Cath Urine  Result Value Ref Range Status   Specimen Description   Final    IN/OUT CATH URINE Performed at [facility], [address], [place], [place] [address]    Special Requests   Final    NONE Performed at [facility], [address], [place], [place] [address]    Culture   Final    NO GROWTH Performed at [facility], [place] [address] [place], [place] [address]    Report Status 03/28/2021 FINAL  Final  Blood Culture (routine x 2)     Status: None   Collection Time: 03/26/21  5:30 PM   Specimen: BLOOD  Result Value Ref Range Status   Specimen Description BLOOD RIGHT ANTECUBITAL  Final   Special Requests   Final    BOTTLES DRAWN AEROBIC AND ANAEROBIC Blood Culture adequate volume   Culture   Final    NO GROWTH 5 DAYS Performed at [facility], [place] [address] [place], [place] [address]    Report Status 03/31/2021 FINAL  Final  Blood Culture (routine x 2)      Status: None   Collection Time: 03/26/21  5:58 PM   Specimen: BLOOD  Result Value Ref Range Status   Specimen Description   Final    BLOOD LEFT ANTECUBITAL Performed at [facility], [address] [place], [place] [address]    Special Requests   Final    Blood Culture adequate volume BOTTLES DRAWN AEROBIC AND ANAEROBIC Performed at [facility], [address] [place], [place] [address]    Culture   Final    NO GROWTH 5 DAYS Performed at [facility], [place] [address] [place], [place] [address]    Report Status 03/31/2021 FINAL  Final  MRSA PCR Screening     Status: None   Collection Time: 03/29/21 10:00 PM  Result Value Ref Range Status   MRSA by PCR NEGATIVE NEGATIVE Final    Comment:        The GeneXpert MRSA Assay (FDA approved for NASAL specimens only), is one component of a comprehensive MRSA colonization surveillance program. It is not intended to diagnose MRSA infection nor to guide or monitor treatment for MRSA infections. Performed at [facility], [place] [address], [place], [place] [address]           Radiology Studies: MR FOOT LEFT W WO CONTRAST  Result Date: 04/03/2021 CLINICAL DATA:  Left foot cellulitis. EXAM: MRI OF THE LEFT FOREFOOT WITHOUT AND WITH CONTRAST TECHNIQUE: Multiplanar, multisequence MR imaging of the left forefoot was performed both before and after administration of intravenous contrast. CONTRAST:  69m GADAVIST GADOBUTROL 1 MMOL/ML IV SOLN COMPARISON:  None. FINDINGS: Bones/Joint/Cartilage No marrow signal abnormality. No fracture or dislocation. Normal alignment. No joint effusion. Ligaments Collateral ligaments are intact.  Lisfranc ligament is intact. Muscles and Tendons Flexor, peroneal and extensor compartment tendons are intact. Muscles are normal. Soft tissue No fluid collection or hematoma. No soft tissue mass. Generalized soft tissue edema along the dorsal aspect of the  foot and ankle with mild enhancement most concerning for cellulitis. No  drainable fluid collection to suggest an abscess. IMPRESSION: 1. Cellulitis of the left foot and ankle. No drainable fluid collection to suggest an abscess. 2. No osteomyelitis. Electronically Signed   By: Kathreen Devoid   On: 04/03/2021 21:40        Scheduled Meds:  DULoxetine  30 mg Oral Daily   DULoxetine  60 mg Oral Daily   fenofibrate  54 mg Oral Daily   fluticasone  2 spray Each Nare Daily   furosemide  60 mg Intravenous Daily   gabapentin  1,200 mg Oral BID   insulin aspart  0-9 Units Subcutaneous TID WC   insulin aspart  6 Units Subcutaneous TID WC   insulin glargine  90 Units Subcutaneous QHS   loratadine  10 mg Oral Daily   rivaroxaban  20 mg Oral Q supper   Continuous Infusions:  sodium chloride 10 mL/hr at 04/04/21 1000   DAPTOmycin (CUBICIN)  IV 350 mg (04/03/21 2022)          Florencia Reasons, MD PhD FACP [facility] 04/04/2021, 1:30 PM

## 2021-04-05 ENCOUNTER — Other Ambulatory Visit (HOSPITAL_COMMUNITY): Payer: Self-pay

## 2021-04-05 DIAGNOSIS — N1831 Chronic kidney disease, stage 3a: Secondary | ICD-10-CM | POA: Diagnosis not present

## 2021-04-05 DIAGNOSIS — E119 Type 2 diabetes mellitus without complications: Secondary | ICD-10-CM | POA: Diagnosis not present

## 2021-04-05 DIAGNOSIS — F321 Major depressive disorder, single episode, moderate: Secondary | ICD-10-CM | POA: Diagnosis not present

## 2021-04-05 DIAGNOSIS — M8580 Other specified disorders of bone density and structure, unspecified site: Secondary | ICD-10-CM | POA: Diagnosis not present

## 2021-04-05 DIAGNOSIS — E1122 Type 2 diabetes mellitus with diabetic chronic kidney disease: Secondary | ICD-10-CM | POA: Diagnosis not present

## 2021-04-05 DIAGNOSIS — I10 Essential (primary) hypertension: Secondary | ICD-10-CM | POA: Diagnosis not present

## 2021-04-05 DIAGNOSIS — N179 Acute kidney failure, unspecified: Secondary | ICD-10-CM | POA: Diagnosis not present

## 2021-04-05 DIAGNOSIS — E1142 Type 2 diabetes mellitus with diabetic polyneuropathy: Secondary | ICD-10-CM | POA: Diagnosis not present

## 2021-04-05 DIAGNOSIS — E785 Hyperlipidemia, unspecified: Secondary | ICD-10-CM | POA: Diagnosis not present

## 2021-04-05 DIAGNOSIS — I4891 Unspecified atrial fibrillation: Secondary | ICD-10-CM | POA: Diagnosis not present

## 2021-04-05 DIAGNOSIS — E1136 Type 2 diabetes mellitus with diabetic cataract: Secondary | ICD-10-CM | POA: Diagnosis not present

## 2021-04-05 LAB — BASIC METABOLIC PANEL
Anion gap: 10 (ref 5–15)
BUN: 28 mg/dL — ABNORMAL HIGH (ref 8–23)
CO2: 29 mmol/L (ref 22–32)
Calcium: 9.7 mg/dL (ref 8.9–10.3)
Chloride: 98 mmol/L (ref 98–111)
Creatinine, Ser: 0.78 mg/dL (ref 0.44–1.00)
GFR, Estimated: >60 mL/min (ref >60)
Glucose, Bld: 331 mg/dL — ABNORMAL HIGH (ref 70–99)
Potassium: 3.7 mmol/L (ref 3.5–5.1)
Sodium: 137 mmol/L (ref 135–145)

## 2021-04-05 LAB — GLUCOSE, CAPILLARY
Glucose-Capillary: 229 mg/dL — ABNORMAL HIGH (ref 70–99)
Glucose-Capillary: 234 mg/dL — ABNORMAL HIGH (ref 70–99)
Glucose-Capillary: 296 mg/dL — ABNORMAL HIGH (ref 70–99)

## 2021-04-05 LAB — MAGNESIUM: Magnesium: 1.8 mg/dL (ref 1.7–2.4)

## 2021-04-05 MED ORDER — ORITAVANCIN DIPHOSPHATE 400 MG IV SOLR
1200.0000 mg | Freq: Once | INTRAVENOUS | Status: DC
  Filled 2021-04-05: qty 120

## 2021-04-05 MED ORDER — POTASSIUM CHLORIDE ER 10 MEQ PO CPCR: 10.0000 meq | ORAL_CAPSULE | ORAL | Status: DC

## 2021-04-05 MED ORDER — FUROSEMIDE 40 MG PO TABS: 40.0000 mg | ORAL_TABLET | ORAL | 0 refills | Status: DC

## 2021-04-05 MED ORDER — DULOXETINE HCL 30 MG PO CPEP: 30.0000 mg | ORAL_CAPSULE | Freq: Every day | ORAL | Status: DC

## 2021-04-05 MED ORDER — ORITAVANCIN DIPHOSPHATE 400 MG IV SOLR
1200.0000 mg | Freq: Once | INTRAVENOUS | Status: AC
  Administered 2021-04-05: 1200 mg via INTRAVENOUS
  Filled 2021-04-05: qty 120

## 2021-04-05 MED ORDER — FUROSEMIDE 40 MG PO TABS: 40.0000 mg | ORAL_TABLET | Freq: Every day | ORAL | 0 refills | Status: DC

## 2021-04-05 MED ORDER — PRAVASTATIN SODIUM 40 MG PO TABS
40.0000 mg | ORAL_TABLET | Freq: Every day | ORAL | Status: DC
Start: 2021-04-05 — End: 2021-08-22

## 2021-04-05 MED ORDER — VITAMIN D (ERGOCALCIFEROL) 1.25 MG (50000 UNIT) PO CAPS: 50000.0000 [IU] | ORAL_CAPSULE | ORAL | Status: DC

## 2021-04-05 NOTE — Discharge Summary (Signed)
Discharge Summary  Melanie Reeves WUJ:[id number] DOB: [birth year]  PCP: Vernie Shanks, MD  Admit date: 03/26/2021 Discharge date: 04/05/2021  Time spent: 17mns, more than 50% time spent on coordination of care.  Recommendations for Outpatient Follow-up:  F/u with PCP within a week  for hospital discharge follow up, repeat cbc/bmp at follow up F/u endocrinologist Dr. KDwyane Deefor diabetes control Follow-up with vein specialist for venous insufficiency Follow-up with cardiology Dr. RHarrington Challenger   Discharge Diagnoses:  Active Hospital Problems   Diagnosis Date Noted   Cellulitis of left lower extremity 03/27/2021   Left foot pain 03/27/2021   Chronic diastolic CHF (congestive heart failure) ([facility] 03/27/2021   Paroxysmal atrial fibrillation ([place] 01/14/2019   Hyperlipidemia 01/14/2019   Diabetes mellitus ([place] 01/14/2019    Resolved Hospital Problems  No resolved problems to display.    Discharge Condition: stable  Diet recommendation: heart healthy/carb modified  Filed Weights   04/01/21 0653 04/02/21 0548 04/04/21 0500  Weight: 132.5 kg 130.8 kg 130.5 kg    History of present illness: Melanie COLONNAis a 70y.o. female with medical history significant for chronic diastolic heart failure, paroxysmal atrial fibrillation chronically anticoagulated on Xarelto, insulin-dependent type 2 diabetes mellitus, [facility] to left lower extremity cellulitis failed outpatient antibiotic treatment.  Hospital Course:  Principal Problem:   Cellulitis of left lower extremity Active Problems:   Paroxysmal atrial fibrillation (HCC)   Hyperlipidemia   Diabetes mellitus (HCC)   Left foot pain   Chronic diastolic CHF (congestive heart failure) (HCC)  Left lower extremity cellulitis -Failed outpatient treatment.  -   -Initially started on vancomycin/ cefepime/flagyl in the ED which were switched to IV Ancef. -Persistent edema/erythema despite many  days of  IV Ancef, persistent leukocytosis, elevated ESR CRP, -  No evidence of abscess or fluid collection on soft tissue ultrasound done on 03/29/2021, MRI left foot no osteomyelitis.Cultures negative so far -Venous doppler to rule out DVT - consulted ID on 6/22 who recommended daptomycin , cellulitis has improved on daptomycin, ID recommended 1 dose of oritavancin on 6/24 and can discharge after the infusion, patient agree with the plan.    Chronic venous insufficiency -Resume elastic stockings at home. -Advised elevation of legs whenever she is sitting down. -vein specialist follow up after discharge, patient is aware   Insulin-dependent diabetes mellitus type 2, uncontrolled, with peripheral neuropathy -Presented with hypoglycemia, blood glucose 57 in the ED initially, hypoglycemia has resolved, now hyperglycemia -A1c 6.5% on 03/19/2021 --Continue home diabetes meds including Toujeo, NovoLog , Invokana, Victoza, metformin -She is to follow-up with endocrinologist Dr. KDwyane Deenext week  Paroxysmal A. Fib -sinus rhythm with chronic left bundle branch block -Home medication atenolol was held since admission, resumed at discharge - Continue Xarelto  Acute on chronic diastolic heart failure -Strict input and output.  Daily weights.  Fluid restriction.  Reportedly on diuretics q. weekly. -she received Lasix 60 mg IV daily in the hospital, she is discharged on oral lasix 457mmwf, she is to follow up with cardiology   Hypokalemia Replace K,  mag 1.8   Allergic rhinitis -Continue loratadine  Anxiety/depression --continue duloxetine  Dyslipidemia -Continue fenofibrate and pravastatin   Class III obesity  Body mass index is 51.95 kg/m.  she is followed at weight management center   DVT prophylaxis: Xarelto Code Status: Full  Consultants: ID   Procedures: None   Antimicrobials:   Anti-infectives (From admission, onward)  Start     Dose/Rate Route Frequency Ordered Stop    04/05/21 1800  Oritavancin Diphosphate (ORBACTIV) 1,200 mg in dextrose 5 % IVPB  Status:  Discontinued        1,200 mg 333.3 mL/hr over 180 Minutes Intravenous Once 04/05/21 1224 04/05/21 1350   04/05/21 1430  Oritavancin Diphosphate (ORBACTIV) 1,200 mg in dextrose 5 % IVPB        1,200 mg 333.3 mL/hr over 180 Minutes Intravenous Once 04/05/21 1350     04/03/21 2000  DAPTOmycin (CUBICIN) 350 mg in sodium chloride 0.9 % IVPB  Status:  Discontinued        4 mg/kg  82.9 kg (Adjusted) 114 mL/hr over 30 Minutes Intravenous Daily 04/03/21 1512 04/05/21 1224   03/28/21 1400  ceFAZolin (ANCEF) IVPB 2g/100 mL premix  Status:  Discontinued        2 g 200 mL/hr over 30 Minutes Intravenous Every 8 hours 03/28/21 0947 04/03/21 1512   03/27/21 0600  vancomycin (VANCOCIN) IVPB 1000 mg/200 mL premix  Status:  Discontinued        1,000 mg 200 mL/hr over 60 Minutes Intravenous Every 12 hours 03/26/21 1807 03/28/21 0947   03/27/21 0200  ceFEPIme (MAXIPIME) 2 g in sodium chloride 0.9 % 100 mL IVPB  Status:  Discontinued        2 g 200 mL/hr over 30 Minutes Intravenous Every 8 hours 03/26/21 1742 03/28/21 0947   03/26/21 1845  vancomycin (VANCOCIN) IVPB 1000 mg/200 mL premix       See Hyperspace for full Linked Orders Report.   1,000 mg 200 mL/hr over 60 Minutes Intravenous  Once 03/26/21 1739 03/27/21 1959   03/26/21 1745  vancomycin (VANCOCIN) IVPB 1000 mg/200 mL premix       See Hyperspace for full Linked Orders Report.   1,000 mg 200 mL/hr over 60 Minutes Intravenous  Once 03/26/21 1739 03/26/21 2052   03/26/21 1715  ceFEPIme (MAXIPIME) 2 g in sodium chloride 0.9 % 100 mL IVPB        2 g 200 mL/hr over 30 Minutes Intravenous  Once 03/26/21 1705 03/26/21 1955   03/26/21 1715  metroNIDAZOLE (FLAGYL) IVPB 500 mg        500 mg 100 mL/hr over 60 Minutes Intravenous  Once 03/26/21 1705 03/26/21 1955   03/26/21 1715  vancomycin (VANCOCIN) IVPB 1000 mg/200 mL premix  Status:  Discontinued        1,000  mg 200 mL/hr over 60 Minutes Intravenous  Once 03/26/21 1705 03/26/21 1739       Discharge Exam: BP 132/70 (BP Location: Left Arm)   Pulse 78   Temp 97.9 F (36.6 C) (Oral)   Resp 17   Ht 5' 2.4" (1.585 m)   Wt 130.5 kg   SpO2 97%   BMI 51.95 kg/m   General: NAD, pleasant, left lower leg erythema/edema has resolved, left foot erythema edema much improved Cardiovascular: RRR Respiratory: Normal respiratory effort  Discharge Instructions You were cared for by a hospitalist during your hospital stay. If you have any questions about your discharge medications or the care you received while you were in the hospital after you are discharged, you can call the unit and asked to speak with the hospitalist on call if the hospitalist that took care of you is not available. Once you are discharged, your primary care physician will handle any further medical issues. Please note that NO REFILLS for any discharge medications will be authorized once you  are discharged, as it is imperative that you return to your primary care physician (or establish a relationship with a primary care physician if you do not have one) for your aftercare needs so that they can reassess your need for medications and monitor your lab values.  Discharge Instructions     Diet Carb Modified   Complete by: As directed    Increase activity slowly   Complete by: As directed       Allergies as of 04/05/2021       Reactions   Simvastatin    Other reaction(s): leg weakness Other reaction(s): leg weakness   Sulfa Antibiotics Other (See Comments)   Cant remember Other reaction(s): as a child        Medication List     STOP taking these medications    doxycycline 100 MG tablet Commonly known as: VIBRA-TABS       TAKE these medications    atenolol 25 MG tablet Commonly known as: TENORMIN Take 1 tablet (25 mg total) by mouth daily.   B-D ULTRAFINE III SHORT PEN 31G X 8 MM Misc Generic drug: Insulin Pen  Needle Use to inject medication 5 times daily.   cyclobenzaprine 5 MG tablet Commonly known as: FLEXERIL Take 5 mg by mouth 3 (three) times daily as needed for muscle spasms.   diphenhydrAMINE 25 mg capsule Commonly known as: BENADRYL Take 50 mg by mouth at bedtime as needed for sleep.   DULoxetine 60 MG capsule Commonly known as: CYMBALTA Take 60 mg by mouth daily. Taking in the AM What changed: Another medication with the same name was changed. Make sure you understand how and when to take each.   DULoxetine 30 MG capsule Commonly known as: CYMBALTA Take 1 capsule (30 mg total) by mouth at bedtime. What changed: See the new instructions.   fenofibrate 145 MG tablet Commonly known as: TRICOR Take 145 mg by mouth daily.   Fish Oil 1000 MG Caps Take 2,000 mg by mouth daily.   furosemide 40 MG tablet Commonly known as: LASIX Take 1 tablet (40 mg total) by mouth every Monday, Wednesday, and Friday. What changed: when to take this   gabapentin 600 MG tablet Commonly known as: NEURONTIN Take 1,200 mg by mouth 2 (two) times daily. Take 2 tablets (1253m total) by mouth twice daily.   HYDROcodone-acetaminophen 5-325 MG tablet Commonly known as: NORCO/VICODIN Take 1 tablet by mouth every 6 (six) hours as needed for moderate pain.   ibuprofen 200 MG tablet Commonly known as: ADVIL Take 400 mg by mouth every 6 (six) hours as needed for fever.   Invokana 300 MG Tabs tablet Generic drug: canagliflozin TAKE 1 TABLET (300 MG TOTAL) BY MOUTH DAILY BEFORE BREAKFAST. What changed: how much to take   loratadine 10 MG tablet Commonly known as: CLARITIN Take 10 mg by mouth daily.   metFORMIN 500 MG 24 hr tablet Commonly known as: GLUCOPHAGE-XR TAKE 4 TABLETS (2,000 MG TOTAL) BY MOUTH DAILY WITH SUPPER.   NovoLOG FlexPen 100 UNIT/ML FlexPen Generic drug: insulin aspart Inject 20-40 Units into the skin 3 (three) times daily with meals.   OneTouch Ultra test strip Generic  drug: glucose blood USE AS INSTRUCTED TO CHECK BLOOD SUGAR THREE TIMES DAILY.   OneTouch Verio w/Device Kit UAD to monitor glucose tid; Dx: E11.65   potassium chloride 10 MEQ CR capsule Commonly known as: MICRO-K Take 1 capsule (10 mEq total) by mouth every Monday, Wednesday, and Friday. Along with the lasix  What changed: when to take this   pravastatin 40 MG tablet Commonly known as: PRAVACHOL Take 1 tablet (40 mg total) by mouth daily.   Toujeo Max SoloStar 300 UNIT/ML Solostar Pen Generic drug: insulin glargine (2 Unit Dial) Inject 150 Units into the skin daily.   Victoza 18 MG/3ML Sopn Generic drug: liraglutide INJECT 0.3 MLS (1.8 MG TOTAL) INTO THE SKIN DAILY. INJECT ONCE DAILY AT THE SAME TIME What changed: See the new instructions.   Vitamin D (Ergocalciferol) 1.25 MG (50000 UNIT) Caps capsule Commonly known as: DRISDOL Take 1 capsule (50,000 Units total) by mouth every 7 (seven) days. Saturday   Xarelto 20 MG Tabs tablet Generic drug: rivaroxaban TAKE 1 TABLET (20 MG TOTAL) BY MOUTH DAILY WITH SUPPER. What changed: how much to take       Allergies  Allergen Reactions   Simvastatin     Other reaction(s): leg weakness Other reaction(s): leg weakness   Sulfa Antibiotics Other (See Comments)    Cant remember Other reaction(s): as a child    Follow-up Information     Vernie Shanks, MD Follow up in 1 week(s).   Specialty: Family Medicine Why: hospital discharge follow up, repeatr cbc/bmp at follow up Contact information: [place] [address] [phone]         Fay Records, MD .   Specialty: Cardiology Contact information: [address] [place] [address] [phone]         [facility]. Schedule an appointment as soon as possible for a visit in 1 month(s).   Why: venous insufficiency Contact information: [place] [phone]         Elayne Snare, MD Follow up.   Specialty: Endocrinology Contact information: [place] [address] [phone]                  The results of significant diagnostics from this hospitalization (including imaging, microbiology, ancillary and laboratory) are listed below for reference.    Significant Diagnostic Studies: MR FOOT LEFT W WO CONTRAST  Result Date: 04/03/2021 CLINICAL DATA:  Left foot cellulitis. EXAM: MRI OF THE LEFT FOREFOOT WITHOUT AND WITH CONTRAST TECHNIQUE: Multiplanar, multisequence MR imaging of the left forefoot was performed both before and after administration of intravenous contrast. CONTRAST:  18m GADAVIST GADOBUTROL 1 MMOL/ML IV SOLN COMPARISON:  None. FINDINGS: Bones/Joint/Cartilage No marrow signal abnormality. No fracture or dislocation. Normal alignment. No joint effusion. Ligaments Collateral ligaments are intact.  Lisfranc ligament is intact. Muscles and Tendons Flexor, peroneal and extensor compartment tendons are intact. Muscles are normal. Soft tissue No fluid collection or hematoma. No soft tissue mass. Generalized soft tissue edema along the dorsal aspect of the foot and ankle with mild enhancement most concerning for cellulitis. No drainable fluid collection to suggest an abscess. IMPRESSION: 1. Cellulitis of the left foot and ankle. No drainable fluid collection to suggest an abscess. 2. No osteomyelitis. Electronically Signed   By: HKathreen Devoid  On: 04/03/2021 21:40   DG Foot Complete Left  Result Date: 03/27/2021 CLINICAL DATA:  Left foot cellulitis, initial encounter EXAM: LEFT FOOT - COMPLETE 3+ VIEW COMPARISON:  None. FINDINGS: Considerable soft tissue swelling is noted over the dorsum of the foot in the region the metatarsals consistent with the given clinical history. Tarsal degenerative change and calcaneal spurring is noted. No acute fracture or dislocation is seen. No radiopaque foreign body is  noted. IMPRESSION:  Changes consistent with the given clinical history of dorsal cellulitis. No definitive findings to suggest osteomyelitis are noted. Electronically Signed   By: Inez Catalina M.D.   On: 03/27/2021 08:18   [place] LT LOWER EXTREM LTD SOFT TISSUE NON VASCULAR  Result Date: 03/29/2021 CLINICAL DATA:  Left foot cellulitis.  Rule out abscess. EXAM: ULTRASOUND LEFT LOWER EXTREMITY LIMITED TECHNIQUE: Ultrasound examination of the lower extremity soft tissues was performed in the area of clinical concern. COMPARISON:  Foot radiograph 03/27/2021 FINDINGS: Targeted sonographic evaluation in the area of clinical concern labeled left foot dorsal aspect. There is diffuse and prominent subcutaneous edema but no focal fluid collection. Mild scattered soft tissue vascularity without any peripherally enhancing foci. No sonographic evidence of radiopaque foreign body. IMPRESSION: Prominent subcutaneous edema without sonographic evidence of abscess or focal fluid collection. Electronically Signed   By: Keith Rake M.D.   On: 03/29/2021 18:37   VAS [place] LOWER EXTREMITY VENOUS (DVT)  Result Date: 04/04/2021  Lower Venous DVT Study Patient Name:  KELLIANNE EK  Date of Exam:   04/04/2021 Medical Rec #: [id number]       Accession #:    [id number] Date of Birth: [birth year]        Patient Gender: F Patient Age:   69Y Exam Location:  [facility] Procedure:      VAS [place] LOWER EXTREMITY VENOUS (DVT) Referring Phys: [id number] [facility] --------------------------------------------------------------------------------  Indications: Pain, Swelling, Erythema, and Cellulitis of LLE ankle and foot.  Limitations: Poor ultrasound/tissue interface. Comparison Study: No previous exams Performing Technologist: Jody Hill RVT, RDMS  Examination Guidelines: A complete evaluation includes B-mode imaging, spectral Doppler, color Doppler, and power Doppler as needed of all accessible portions of each vessel. Bilateral testing is considered an  integral part of a complete examination. Limited examinations for reoccurring indications may be performed as noted. The reflux portion of the exam is performed with the patient in reverse Trendelenburg.  +-----+---------------+---------+-----------+----------+--------------+ RIGHTCompressibilityPhasicitySpontaneityPropertiesThrombus Aging +-----+---------------+---------+-----------+----------+--------------+ CFV  Full           Yes      Yes                                 +-----+---------------+---------+-----------+----------+--------------+   +---------+---------------+---------+-----------+----------+-------------------+ LEFT     CompressibilityPhasicitySpontaneityPropertiesThrombus Aging      +---------+---------------+---------+-----------+----------+-------------------+ CFV      Full           Yes      Yes                                      +---------+---------------+---------+-----------+----------+-------------------+ SFJ      Full                                                             +---------+---------------+---------+-----------+----------+-------------------+ FV Prox  Full           Yes      Yes                                      +---------+---------------+---------+-----------+----------+-------------------+  FV Mid   Full           Yes      Yes                                      +---------+---------------+---------+-----------+----------+-------------------+ FV DistalFull           Yes      Yes                                      +---------+---------------+---------+-----------+----------+-------------------+ PFV      Full                                                             +---------+---------------+---------+-----------+----------+-------------------+ POP      Full           Yes      Yes                                      +---------+---------------+---------+-----------+----------+-------------------+ PTV       Full                                         Not well visualized +---------+---------------+---------+-----------+----------+-------------------+ PERO     Full                                         Not well visualized +---------+---------------+---------+-----------+----------+-------------------+    Summary: RIGHT: - No evidence of common femoral vein obstruction.  LEFT: - There is no evidence of deep vein thrombosis in the lower extremity. - There is no evidence of superficial venous thrombosis.  - No cystic structure found in the popliteal fossa.  *See table(s) above for measurements and observations. Electronically signed by Monica Martinez MD on 04/04/2021 at 6:12:03 PM.    Final     Microbiology: Recent Results (from the past 240 hour(s))  Resp Panel by RT-PCR (Flu A&B, Covid) Nasopharyngeal Swab     Status: None   Collection Time: 03/26/21  5:23 PM   Specimen: Nasopharyngeal Swab; Nasopharyngeal(NP) swabs in vial transport medium  Result Value Ref Range Status   SARS Coronavirus 2 by RT PCR NEGATIVE NEGATIVE Final    Comment: (NOTE) SARS-CoV-2 target nucleic acids are NOT DETECTED.  The SARS-CoV-2 RNA is generally detectable in upper respiratory specimens during the acute phase of infection. The lowest concentration of SARS-CoV-2 viral copies this assay can detect is 138 copies/mL. A negative result does not preclude SARS-Cov-2 infection and should not be used as the sole basis for treatment or other patient management decisions. A negative result may occur with  improper specimen collection/handling, submission of specimen other than nasopharyngeal swab, presence of viral mutation(s) within the areas targeted by this assay, and inadequate number of viral copies(<138 copies/mL). A negative result must be combined with clinical observations, patient history, and epidemiological information. The  expected result is Negative.  Fact Sheet for Patients:   EntrepreneurPulse.com.au  Fact Sheet for Healthcare Providers:  IncredibleEmployment.be  This test is no t yet approved or cleared by the [place] FDA and  has been authorized for detection and/or diagnosis of SARS-CoV-2 by FDA under an Emergency Use Authorization (EUA). This EUA will remain  in effect (meaning this test can be used) for the duration of the COVID-19 declaration under Section 564(b)(1) of the Act, 21 U.S.C.section 360bbb-3(b)(1), unless the authorization is terminated  or revoked sooner.       Influenza A by PCR NEGATIVE NEGATIVE Final   Influenza B by PCR NEGATIVE NEGATIVE Final    Comment: (NOTE) The Xpert Xpress SARS-CoV-2/FLU/RSV plus assay is intended as an aid in the diagnosis of influenza from Nasopharyngeal swab specimens and should not be used as a sole basis for treatment. Nasal washings and aspirates are unacceptable for Xpert Xpress SARS-CoV-2/FLU/RSV testing.  Fact Sheet for Patients: EntrepreneurPulse.com.au  Fact Sheet for Healthcare Providers: IncredibleEmployment.be  This test is not yet approved or cleared by the [place] FDA and has been authorized for detection and/or diagnosis of SARS-CoV-2 by FDA under an Emergency Use Authorization (EUA). This EUA will remain in effect (meaning this test can be used) for the duration of the COVID-19 declaration under Section 564(b)(1) of the Act, 21 U.S.C. section 360bbb-3(b)(1), unless the authorization is terminated or revoked.  Performed at [facility], [address] [place], [place] [address]   Urine culture     Status: None   Collection Time: 03/26/21  5:23 PM   Specimen: In/Out Cath Urine  Result Value Ref Range Status   Specimen Description   Final    IN/OUT CATH URINE Performed at [facility], [address] [place], [place] [address]    Special Requests   Final     NONE Performed at [facility], [address], [place], [place] [address]    Culture   Final    NO GROWTH Performed at [facility], [place] [address], [place], [place] [address]    Report Status 03/28/2021 FINAL  Final  Blood Culture (routine x 2)     Status: None   Collection Time: 03/26/21  5:30 PM   Specimen: BLOOD  Result Value Ref Range Status   Specimen Description BLOOD RIGHT ANTECUBITAL  Final   Special Requests   Final    BOTTLES DRAWN AEROBIC AND ANAEROBIC Blood Culture adequate volume   Culture   Final    NO GROWTH 5 DAYS Performed at [facility], [place] [address], [place], [place] [address]    Report Status 03/31/2021 FINAL  Final  Blood Culture (routine x 2)     Status: None   Collection Time: 03/26/21  5:58 PM   Specimen: BLOOD  Result Value Ref Range Status   Specimen Description   Final    BLOOD LEFT ANTECUBITAL Performed at [facility], [address], [place], [place] [address]    Special Requests   Final    Blood Culture adequate volume BOTTLES DRAWN AEROBIC AND ANAEROBIC Performed at [facility], [address] [place], [place] [address]    Culture   Final    NO GROWTH 5 DAYS Performed at [facility], [place] [address], [place], [place] [address]    Report Status 03/31/2021 FINAL  Final  MRSA PCR Screening     Status: None   Collection Time: 03/29/21  10:00 PM  Result Value Ref Range Status   MRSA by PCR NEGATIVE NEGATIVE Final    Comment:        The GeneXpert MRSA Assay (FDA approved for NASAL specimens only), is one component of a comprehensive MRSA colonization surveillance program. It is not intended to diagnose MRSA infection nor to guide or monitor treatment for MRSA infections. Performed at [facility], [place] [address], [place], [place] [address]      Labs: Basic Metabolic Panel: Recent Labs  Lab 03/30/21 0331 04/02/21 0333  04/03/21 0310 04/04/21 0453 04/05/21 0359  NA 138 136 136 137 137  K 3.6 3.7 3.4* 3.7 3.7  CL 103 98 98 98 98  CO2 _0 GLUCOSE 225* 320* 318* 293* 331*  BUN 22 29* 31* 28* 28*  CREATININE 1.03* 0.91 0.89 0.79 0.78  CALCIUM 9.3 9.5 9.8 10.0 9.7  MG  --  1.9 1.8  --  1.8   Liver Function Tests: No results for input(s): AST, ALT, ALKPHOS, BILITOT, PROT, ALBUMIN in the last 168 hours. No results for input(s): LIPASE, AMYLASE in the last 168 hours. No results for input(s): AMMONIA in the last 168 hours. CBC: Recent Labs  Lab 03/30/21 0331 04/02/21 0333 04/03/21 0310 04/04/21 0453  WBC 11.5* 13.1* 13.4* 12.8*  NEUTROABS 6.6 8.5* 9.0* 8.1*  HGB 10.9* 10.8* 10.7* 10.6*  HCT 36.2 35.1* 35.0* 35.8*  MCV 89.6 89.1 89.1 90.6  PLT 377 358 342 319   Cardiac Enzymes: Recent Labs  Lab 04/04/21 0453  CKTOTAL 43   BNP: BNP (last 3 results) Recent Labs    11/24/20 0110  BNP 79.3    ProBNP (last 3 results) No results for input(s): PROBNP in the last 8760 hours.  CBG: Recent Labs  Lab 04/04/21 1628 04/04/21 2131 04/05/21 0707 04/05/21 1131 04/05/21 1614  GLUCAP 255* 268* 229* 234* 296*       Signed:  Florencia Reasons MD, PhD, FACP  [facility] 04/05/2021, 4:36 PM

## 2021-04-05 NOTE — Progress Notes (Signed)
[facility] Infectious Diseases Follow Up Note  Patient Identification: Patient Name: Melanie Reeves MRN: [id number] Admit Date: 03/26/2021  4:00 PM Age: 70 y.o.Today's Date: 04/05/2021   Reason for Visit: Left foot cellulitis  Principal Problem:   Cellulitis of left lower extremity Active Problems:   Paroxysmal atrial fibrillation (HCC)   Hyperlipidemia   Diabetes mellitus (HCC)   Left foot pain   Chronic diastolic CHF (congestive heart failure) (HCC)  Antibiotics: Vancomycin 6/14-6/15, cefazolin 6/16-6-22, Daptomycin 6/22-current                    Cefepime 6/14-6/15                    Metronidazole 6/14   Lines/Tubes: PIV's  Interval Events: Afebrile, leukocytosis downtrending.  Ultrasound lower extremities with no DVT  Assessment Left foot cellulitis -redness seems to be improving, tenderness improved Bilateral chronic leg swelling in the setting of chronic venous insufficiency Obesity/OSA Congestive diastolic heart failure  Comments-she seems to have chronic redness in the bilateral lower legs not related to cellulitis.  Left dorsal foot cellulitis is improving   Recommendations Will give 1 dose of oritavancin today, no need to stay inpatient from ID standpoint Keep leg elevated She will need evaluation with vascular surgery to address her venous insufficiency ID will sign off  Rest of the management as per the primary team. Thank you for the consult. Please page with pertinent questions or concerns.  ______________________________________________________________________ Subjective patient seen and examined at the bedside.  She is eating her lunch.  She tells me the redness in the left foot and tenderness has improved compared to yesterday  Vitals BP 132/70 (BP Location: Left Arm)   Pulse 78   Temp 97.9 F (36.6 C) (Oral)   Resp 17   Ht 5' 2.4" (1.585 m)   Wt 130.5 kg   SpO2 97%   BMI 51.95 kg/m      Physical Exam Constitutional: Morbidly obese female, not in acute distress    Comments:   Cardiovascular:     Rate and Rhythm: Normal rate and regular rhythm.     Heart sounds: Distant heart sounds  Pulmonary:     Effort: Pulmonary effort is normal.     Comments: Clear lung fields  Abdominal:     Palpations: Abdomen is soft.     Tenderness: Obese abdomen, nontender  Musculoskeletal:        General: No swelling or tenderness.  Chronic bilateral leg swelling, left dorsal foot redness and tenderness improving  Skin:    Comments: As above  Neurological:     General: No focal deficit present.   Psychiatric:        Mood and Affect: Mood normal.   Pertinent Microbiology Results for orders placed or performed during the hospital encounter of 03/26/21  Resp Panel by RT-PCR (Flu A&B, Covid) Nasopharyngeal Swab     Status: None   Collection Time: 03/26/21  5:23 PM   Specimen: Nasopharyngeal Swab; Nasopharyngeal(NP) swabs in vial transport medium  Result Value Ref Range Status   SARS Coronavirus 2 by RT PCR NEGATIVE NEGATIVE Final    Comment: (NOTE) SARS-CoV-2 target nucleic acids are NOT DETECTED.  The SARS-CoV-2 RNA is generally detectable in upper respiratory specimens during the acute phase of infection. The lowest concentration of SARS-CoV-2 viral copies this assay can detect is 138 copies/mL. A negative result does not preclude SARS-Cov-2 infection and should not be used as the sole basis for  treatment or other patient management decisions. A negative result may occur with  improper specimen collection/handling, submission of specimen other than nasopharyngeal swab, presence of viral mutation(s) within the areas targeted by this assay, and inadequate number of viral copies(<138 copies/mL). A negative result must be combined with clinical observations, patient history, and epidemiological information. The expected result is Negative.  Fact Sheet for Patients:   BloggerCourse.com  Fact Sheet for Healthcare Providers:  SeriousBroker.it  This test is no t yet approved or cleared by the [place] FDA and  has been authorized for detection and/or diagnosis of SARS-CoV-2 by FDA under an Emergency Use Authorization (EUA). This EUA will remain  in effect (meaning this test can be used) for the duration of the COVID-19 declaration under Section 564(b)(1) of the Act, 21 U.S.C.section 360bbb-3(b)(1), unless the authorization is terminated  or revoked sooner.       Influenza A by PCR NEGATIVE NEGATIVE Final   Influenza B by PCR NEGATIVE NEGATIVE Final    Comment: (NOTE) The Xpert Xpress SARS-CoV-2/FLU/RSV plus assay is intended as an aid in the diagnosis of influenza from Nasopharyngeal swab specimens and should not be used as a sole basis for treatment. Nasal washings and aspirates are unacceptable for Xpert Xpress SARS-CoV-2/FLU/RSV testing.  Fact Sheet for Patients: BloggerCourse.com  Fact Sheet for Healthcare Providers: SeriousBroker.it  This test is not yet approved or cleared by the [place] FDA and has been authorized for detection and/or diagnosis of SARS-CoV-2 by FDA under an Emergency Use Authorization (EUA). This EUA will remain in effect (meaning this test can be used) for the duration of the COVID-19 declaration under Section 564(b)(1) of the Act, 21 U.S.C. section 360bbb-3(b)(1), unless the authorization is terminated or revoked.  Performed at [facility], [address] [place], [place] [address]   Urine culture     Status: None   Collection Time: 03/26/21  5:23 PM   Specimen: In/Out Cath Urine  Result Value Ref Range Status   Specimen Description   Final    IN/OUT CATH URINE Performed at [facility], [address], [place], [place] [address]    Special Requests   Final     NONE Performed at [facility], [address], [place], [place] [address]    Culture   Final    NO GROWTH Performed at [facility], [address] [place], [place] [address]    Report Status 03/28/2021 FINAL  Final  Blood Culture (routine x 2)     Status: None   Collection Time: 03/26/21  5:30 PM   Specimen: BLOOD  Result Value Ref Range Status   Specimen Description BLOOD RIGHT ANTECUBITAL  Final   Special Requests   Final    BOTTLES DRAWN AEROBIC AND ANAEROBIC Blood Culture adequate volume   Culture   Final    NO GROWTH 5 DAYS Performed at [facility], [address] [place], [place] [address]    Report Status 03/31/2021 FINAL  Final  Blood Culture (routine x 2)     Status: None   Collection Time: 03/26/21  5:58 PM   Specimen: BLOOD  Result Value Ref Range Status   Specimen Description   Final    BLOOD LEFT ANTECUBITAL Performed at [facility], [address] [place], [place] [address]    Special Requests   Final    Blood Culture adequate volume BOTTLES DRAWN AEROBIC AND ANAEROBIC Performed at [facility], [phone]  [address], [place], [place] [address]    Culture   Final    NO GROWTH 5 DAYS Performed at [facility], [address], [place], [place] [address]    Report Status 03/31/2021 FINAL  Final  MRSA PCR Screening     Status: None   Collection Time: 03/29/21 10:00 PM  Result Value Ref Range Status   MRSA by PCR NEGATIVE NEGATIVE Final    Comment:        The GeneXpert MRSA Assay (FDA approved for NASAL specimens only), is one component of a comprehensive MRSA colonization surveillance program. It is not intended to diagnose MRSA infection nor to guide or monitor treatment for MRSA infections. Performed at [facility], [address], [place], [place] [address]     Pertinent Lab. CBC Latest Ref Rng & Units 04/04/2021 04/03/2021 04/02/2021  WBC 4.0 - 10.5  K/uL 12.8(H) 13.4(H) 13.1(H)  Hemoglobin 12.0 - 15.0 g/dL 10.6(L) 10.7(L) 10.8(L)  Hematocrit 36.0 - 46.0 % 35.8(L) 35.0(L) 35.1(L)  Platelets 150 - 400 K/uL 319 342 358   CMP Latest Ref Rng & Units 04/05/2021 04/04/2021 04/03/2021  Glucose 70 - 99 mg/dL 094(B) 096(G) 836(O)  BUN 8 - 23 mg/dL 29(U) 76(L) 46(T)  Creatinine 0.44 - 1.00 mg/dL 0.35 4.65 6.81  Sodium 135 - 145 mmol/L 137 137 136  Potassium 3.5 - 5.1 mmol/L 3.7 3.7 3.4(L)  Chloride 98 - 111 mmol/L 98 98 98  CO2 22 - 32 mmol/L 29 29 28   Calcium 8.9 - 10.3 mg/dL 9.7 9.8  Total Protein 6.5 - 8.1 g/dL - - -  Total Bilirubin 0.3 - 1.2 mg/dL - - -  Alkaline Phos 38 - 126 U/L - - -  AST 15 - 41 U/L - - -  ALT 0 - 44 U/L - - -     Pertinent Imaging today Plain films and CT images have been personally visualized and interpreted; radiology reports have been reviewed. Decision making incorporated into the Impression / Recommendations.  I have spent more than 35 minutes for this patient encounter including review of prior medical records, coordination of care  with greater than 50% of time being face to face/counseling and discussing diagnostics/treatment plan with the patient/family.  Electronically signed by:   27.5, MD Infectious Disease Physician [facility] Infect[facility] Pager: [phone]

## 2021-04-05 NOTE — Plan of Care (Signed)
  Problem: Education: Goal: Knowledge of General Education information will improve Description: Including pain rating scale, medication(s)/side effects and non-pharmacologic comfort measures Outcome: Adequate for Discharge   Problem: Health Behavior/Discharge Planning: Goal: Ability to manage health-related needs will improve Outcome: Adequate for Discharge   Problem: Clinical Measurements: Goal: Ability to maintain clinical measurements within normal limits will improve Outcome: Adequate for Discharge Goal: Will remain free from infection Outcome: Adequate for Discharge Goal: Diagnostic test results will improve Outcome: Adequate for Discharge Goal: Respiratory complications will improve Outcome: Adequate for Discharge Goal: Cardiovascular complication will be avoided Outcome: Adequate for Discharge   Problem: Activity: Goal: Risk for activity intolerance will decrease Outcome: Adequate for Discharge   Problem: Coping: Goal: Level of anxiety will decrease Outcome: Adequate for Discharge   Problem: Elimination: Goal: Will not experience complications related to bowel motility Outcome: Adequate for Discharge   Problem: Pain Managment: Goal: General experience of comfort will improve Outcome: Adequate for Discharge   Problem: Safety: Goal: Ability to remain free from injury will improve Outcome: Adequate for Discharge   Problem: Skin Integrity: Goal: Risk for impaired skin integrity will decrease Outcome: Adequate for Discharge   

## 2021-04-16 DIAGNOSIS — I4891 Unspecified atrial fibrillation: Secondary | ICD-10-CM | POA: Diagnosis not present

## 2021-04-16 DIAGNOSIS — E1122 Type 2 diabetes mellitus with diabetic chronic kidney disease: Secondary | ICD-10-CM | POA: Diagnosis not present

## 2021-04-16 DIAGNOSIS — I872 Venous insufficiency (chronic) (peripheral): Secondary | ICD-10-CM | POA: Diagnosis not present

## 2021-04-16 DIAGNOSIS — M1990 Unspecified osteoarthritis, unspecified site: Secondary | ICD-10-CM | POA: Diagnosis not present

## 2021-04-16 DIAGNOSIS — E119 Type 2 diabetes mellitus without complications: Secondary | ICD-10-CM | POA: Diagnosis not present

## 2021-04-16 DIAGNOSIS — J309 Allergic rhinitis, unspecified: Secondary | ICD-10-CM | POA: Diagnosis not present

## 2021-04-16 DIAGNOSIS — R600 Localized edema: Secondary | ICD-10-CM | POA: Diagnosis not present

## 2021-04-16 DIAGNOSIS — I10 Essential (primary) hypertension: Secondary | ICD-10-CM | POA: Diagnosis not present

## 2021-04-16 DIAGNOSIS — E1142 Type 2 diabetes mellitus with diabetic polyneuropathy: Secondary | ICD-10-CM | POA: Diagnosis not present

## 2021-04-16 DIAGNOSIS — R2681 Unsteadiness on feet: Secondary | ICD-10-CM | POA: Diagnosis not present

## 2021-04-16 DIAGNOSIS — E785 Hyperlipidemia, unspecified: Secondary | ICD-10-CM | POA: Diagnosis not present

## 2021-04-17 ENCOUNTER — Other Ambulatory Visit: Payer: Self-pay

## 2021-04-17 ENCOUNTER — Encounter: Payer: Self-pay | Admitting: Endocrinology

## 2021-04-17 ENCOUNTER — Ambulatory Visit (INDEPENDENT_AMBULATORY_CARE_PROVIDER_SITE_OTHER): Payer: PPO | Admitting: Endocrinology

## 2021-04-17 VITALS — BP 140/70 | HR 83 | Ht 61.0 in | Wt 289.4 lb

## 2021-04-17 DIAGNOSIS — E1165 Type 2 diabetes mellitus with hyperglycemia: Secondary | ICD-10-CM | POA: Diagnosis not present

## 2021-04-17 DIAGNOSIS — Z794 Long term (current) use of insulin: Secondary | ICD-10-CM

## 2021-04-17 NOTE — Patient Instructions (Addendum)
Toujeo 154   Check blood sugars on waking up  5 days a week  Also check blood sugars about 2 hours after meals and do this after different meals by rotation  Recommended blood sugar levels on waking up are 90-130 and about 2 hours after meal is 130-160  Please bring your blood sugar monitor to each visit, thank you

## 2021-04-17 NOTE — Progress Notes (Signed)
Patient ID: Melanie Reeves, female   DOB: 1951/02/21, 70 y.o.   MRN: [id number]           Reason for Appointment: Follow-up  for Type 2 Diabetes  Referring physician: Yaakov Guthrie   History of Present Illness:          Date of diagnosis of type 2 diabetes mellitus:  2011       Background history:     She was tested for diabetes when she presented with numbness and tingling in her feet to the health Department. She does not know what her blood sugars were initially  She was however started on both metformin and glipizide at that time. She thinks that in early 2015 she was started on Lantus insulin also developed poor control  Her record indicates A1c levels ranging from 8.2-9.3 and higher at 11.4 in 9/16 She had been started on Victoza in 12/16  Recent history:   INSULIN regimen is: Toujeo 150 U in am, NovoLog 20- 40 AC  Non-insulin hypoglycemic drugs the patient is taking are: Invokana 300 mg,  Metformin ER 2000 mg daily, Victoza 1.72m daily  A1c is 6.5 last month, was 6.8   Current blood sugar patterns and problems identified: She has had overall about the same degree of control as on the last visit She was not able to get the Dexcom because of the high co-pay, previously freestyle libre was not accurate Again despite watching her diet her insulin requirement has not come down Recently she has been in the hospital and blood sugars were relatively higher However more recently FASTING blood sugars appear to be mildly increased Overall blood sugar monitoring has been decreased She has not had many blood sugars after meals and only a couple of high readings at night Hypoglycemia not present She is adjusting her mealtime insulin between 20 and 40 units, larger amounts for meals with more carbohydrate are if the sugars are higher However she thinks some of her high readings are higher in the morning because of some bedtime snacks like cheese puffs Her weight is about the  same  Dinner variable, usually 10 pm   Side effects from medications have been: none   PRE-MEAL Fasting Lunch Dinner Bedtime Overall  Glucose range: 114-182  103-190    Mean/median: 148  148  146   POST-MEAL PC Breakfast PC Lunch PC Dinner  Glucose range:   98-285  Mean/median:      Previously:  PRE-MEAL Fasting Lunch Dinner Bedtime Overall  Glucose range:  111-215    108-178  95-249  Mean/median:  146  142  134  141  138/149   POST-MEAL PC Breakfast PC Lunch PC Dinner  Glucose range:     Mean/median:    156     Self-care: The diet that the patient has been following is: None, not restricting carbohydrates and fats;   eating more starchy foods and bread at times    Meal times: Breakfast:10-11 AM Lunch:4 PM Dinner: 7 pm    Typical meal intake: Breakfast is eggs/ low fat meat..  Lunch.  Evening meal is meat, vegetables,, sometimes [employer]               Dietician visit, most recent: Years ago                Weight history: Lowest 178 lb several years ago, more recently 300-330  Wt Readings from Last 3 Encounters:  04/17/21 289 lb 6.4 oz (131.3  kg)  04/04/21 287 lb 11.2 oz (130.5 kg)  03/15/21 290 lb (131.5 kg)   Glycemic control:   Lab Results  Component Value Date   HGBA1C 6.5 03/19/2021   HGBA1C 6.8 (H) 12/18/2020   HGBA1C 8.2 (H) 11/24/2020   Lab Results  Component Value Date   MICROALBUR 2.4 (H) 06/14/2020   LDLCALC 65 09/17/2020   CREATININE 0.78 04/05/2021      No visits with results within 1 Week(s) from this visit.  Latest known visit with results is:  No results displayed because visit has over 200 results.        Allergies as of 04/17/2021       Reactions   Simvastatin    Other reaction(s): leg weakness Other reaction(s): leg weakness   Sulfa Antibiotics Other (See Comments)   Cant remember Other reaction(s): as a child        Medication List        Accurate as of April 17, 2021  4:04 PM. If you have any questions, ask your  nurse or doctor.          atenolol 25 MG tablet Commonly known as: TENORMIN Take 1 tablet (25 mg total) by mouth daily.   B-D ULTRAFINE III SHORT PEN 31G X 8 MM Misc Generic drug: Insulin Pen Needle Use to inject medication 5 times daily.   cyclobenzaprine 5 MG tablet Commonly known as: FLEXERIL Take 5 mg by mouth 3 (three) times daily as needed for muscle spasms.   diphenhydrAMINE 25 mg capsule Commonly known as: BENADRYL Take 50 mg by mouth at bedtime as needed for sleep.   DULoxetine 60 MG capsule Commonly known as: CYMBALTA Take 60 mg by mouth daily. Taking in the AM   DULoxetine 30 MG capsule Commonly known as: CYMBALTA Take 1 capsule (30 mg total) by mouth at bedtime.   fenofibrate 145 MG tablet Commonly known as: TRICOR Take 145 mg by mouth daily.   Fish Oil 1000 MG Caps Take 2,000 mg by mouth daily.   furosemide 40 MG tablet Commonly known as: LASIX Take 1 tablet (40 mg total) by mouth every Monday, Wednesday, and Friday.   gabapentin 600 MG tablet Commonly known as: NEURONTIN Take 1,200 mg by mouth 2 (two) times daily. Take 2 tablets (1239m total) by mouth twice daily.   HYDROcodone-acetaminophen 5-325 MG tablet Commonly known as: NORCO/VICODIN Take 1 tablet by mouth every 6 (six) hours as needed for moderate pain.   ibuprofen 200 MG tablet Commonly known as: ADVIL Take 400 mg by mouth every 6 (six) hours as needed for fever.   Invokana 300 MG Tabs tablet Generic drug: canagliflozin TAKE 1 TABLET (300 MG TOTAL) BY MOUTH DAILY BEFORE BREAKFAST.   loratadine 10 MG tablet Commonly known as: CLARITIN Take 10 mg by mouth daily.   metFORMIN 500 MG 24 hr tablet Commonly known as: GLUCOPHAGE-XR TAKE 4 TABLETS (2,000 MG TOTAL) BY MOUTH DAILY WITH SUPPER.   NovoLOG FlexPen 100 UNIT/ML FlexPen Generic drug: insulin aspart Inject 20-40 Units into the skin 3 (three) times daily with meals.   OneTouch Ultra test strip Generic drug: glucose blood USE  AS INSTRUCTED TO CHECK BLOOD SUGAR THREE TIMES DAILY.   OneTouch Verio w/Device Kit UAD to monitor glucose tid; Dx: E11.65   potassium chloride 10 MEQ CR capsule Commonly known as: MICRO-K Take 1 capsule (10 mEq total) by mouth every Monday, Wednesday, and Friday. Along with the lasix   pravastatin 40 MG tablet Commonly known  as: PRAVACHOL Take 1 tablet (40 mg total) by mouth daily.   Toujeo Max SoloStar 300 UNIT/ML Solostar Pen Generic drug: insulin glargine (2 Unit Dial) Inject 150 Units into the skin daily.   Victoza 18 MG/3ML Sopn Generic drug: liraglutide INJECT 0.3 MLS (1.8 MG TOTAL) INTO THE SKIN DAILY. INJECT ONCE DAILY AT THE SAME TIME   Vitamin D (Ergocalciferol) 1.25 MG (50000 UNIT) Caps capsule Commonly known as: DRISDOL Take 1 capsule (50,000 Units total) by mouth every 7 (seven) days. Saturday   Xarelto 20 MG Tabs tablet Generic drug: rivaroxaban TAKE 1 TABLET (20 MG TOTAL) BY MOUTH DAILY WITH SUPPER.        Allergies:  Allergies  Allergen Reactions   Simvastatin     Other reaction(s): leg weakness Other reaction(s): leg weakness   Sulfa Antibiotics Other (See Comments)    Cant remember Other reaction(s): as a child    Past Medical History:  Diagnosis Date   Abnormal electrocardiogram 08/14/2014   LBBB   Anemia    Anxiety    Arthritis    Atrial fibrillation (HCC)    Bilateral swelling of feet    Chest pain    Constipation    Depression    Diabetes mellitus ([place])    Difficulty walking    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   Qualifier: Diagnosis of  By: Aline Brochure MD, [place]     Dyspnea 08/14/2014   Edema    Gallbladder problem    GERD (gastroesophageal reflux disease)    Hyperlipidemia    Hypertension    LOW BACK PAIN 12/20/2009   Qualifier: Diagnosis of  By: Aline Brochure MD, Stanley     Morbid obesity ([place])    OSA (obstructive sleep apnea) 08/16/2014   Osteoarthritis    Other fatigue    Palpitations    Seasonal allergies    Shortness of breath     Shortness of breath on exertion    Sinusitis    SPINAL STENOSIS 12/20/2009   Qualifier: Diagnosis of  By: Aline Brochure MD, Stanley     Stage 3 chronic kidney disease [facility])     Past Surgical History:  Procedure Laterality Date   CHOLECYSTECTOMY     COLONOSCOPY WITH PROPOFOL N/A 11/29/2014   Procedure: COLONOSCOPY WITH PROPOFOL;  Surgeon: Arta Silence, MD;  Location: [facility];  Service: Endoscopy;  Laterality: N/A;   SKIN CANCER EXCISION     TONSILLECTOMY      Family History  Problem Relation Age of Onset   Heart disease Father        MI   Cancer - Lung Father        Small cell Lung CA   Diabetes Father    Hypertension Father    Hyperlipidemia Father    Cancer Father    Depression Father    Anxiety disorder Father    Hypertension Mother    Hyperlipidemia Mother    Stroke Mother    Kidney disease Mother    Depression Mother    Anxiety disorder Mother    Bipolar disorder Mother    Eating disorder Mother    Obesity Mother     Social History:  reports that she quit smoking about 27 years ago. Her smoking use included cigarettes. She has a 40.00 pack-year smoking history. She has quit using smokeless tobacco. She reports current alcohol use. She reports that she does not use drugs.    Review of Systems    Lipid history:    Triglycerides have been over 400  at baseline and she is taking fenofibrate in addition to her pravastatin  She is taking pravastatin; she had intolerance to simvastatin before LDL is below 100 Triglycerides are below 200    Lab Results  Component Value Date   CHOL 141 09/17/2020   CHOL 134 02/28/2020   CHOL 111 07/22/2019   Lab Results  Component Value Date   HDL 42.40 09/17/2020   HDL 32.40 (L) 02/28/2020   HDL 33.30 (L) 07/22/2019   Lab Results  Component Value Date   LDLCALC 65 09/17/2020   LDLCALC 66 03/08/2019   LDLCALC 104 (H) 09/17/2015   Lab Results  Component Value Date   TRIG 127 10/23/2020   TRIG 165.0 (H) 09/17/2020    TRIG 227.0 (H) 02/28/2020   Lab Results  Component Value Date   CHOLHDL 3 09/17/2020   CHOLHDL 4 02/28/2020   CHOLHDL 3 07/22/2019   Lab Results  Component Value Date   LDLDIRECT 72.0 02/28/2020   LDLDIRECT 62.0 07/22/2019   LDLDIRECT 83.0 01/11/2019            .  Most recent eye exam was In 7/21, report not available   Hypertension:  On treatment from PCP; taking atenolol 50 mg and Hytrin 2 mg Also on Invokana   BP Readings from Last 3 Encounters:  04/17/21 140/70  04/05/21 132/70  03/15/21 132/74   Electrolytes: She was previously on Lasix and potassium but she still takes potassium but not Lasix  Lab Results  Component Value Date   K 3.7 04/05/2021     NEUROPATHY: Has persistent numbness for in her feet the last several years along with some pain and weakness   She is taking Cymbalta a total of 90 mg a day and 600 mg of gabapentin  Last foot exam in 7/21 done by her PCP, previous findings:   Monofilament sensation is decreased distally on all the toes and distal plantar surfaces   Physical Examination:  BP 140/70   Pulse 83   Ht _0  (1.549 m)   Wt 289 lb 6.4 oz (131.3 kg)   SpO2 99%   BMI 54.68 kg/m       ASSESSMENT:  Diabetes type 2,  with morbid obesity on insulin  See history of present illness for detailed discussion of  current management, blood sugar patterns and problems identified   She is on basal bolus insulin, Invokana and Victoza  Her A1c is consistently below 7 although her last reading was a month ago  Still taking relatively large amount of 150 units of basal insulin and blood sugars are relatively higher in the morning lately Overall however she has maintained a good diet although has not lost any weight recently Also on Invokana with no issues with renal function or candidiasis  She can do better with postprandial monitoring   PLAN:   She will go up at least 4 units on her Toujeo to get morning sugars below 130 More  readings after meals to help adjust mealtime doses Continue to moderate on portions and carbohydrates including with nighttime snacks   Patient Instructions  Toujeo 154   Check blood sugars on waking up  5 days a week  Also check blood sugars about 2 hours after meals and do this after different meals by rotation  Recommended blood sugar levels on waking up are 90-130 and about 2 hours after meal is 130-160  Please bring your blood sugar monitor to each visit, thank you  Elayne Snare 04/17/2021, 4:04 PM   Note: This office note was prepared with Dragon voice recognition system technology. Any transcriptional errors that result from this process are unintentional.

## 2021-04-24 ENCOUNTER — Other Ambulatory Visit: Payer: Self-pay

## 2021-04-24 ENCOUNTER — Ambulatory Visit (INDEPENDENT_AMBULATORY_CARE_PROVIDER_SITE_OTHER): Payer: PPO | Admitting: Family Medicine

## 2021-04-24 ENCOUNTER — Encounter (INDEPENDENT_AMBULATORY_CARE_PROVIDER_SITE_OTHER): Payer: Self-pay | Admitting: Family Medicine

## 2021-04-24 VITALS — BP 120/77 | HR 88 | Temp 97.7°F | Ht 61.0 in | Wt 282.0 lb

## 2021-04-24 DIAGNOSIS — Z6841 Body Mass Index (BMI) 40.0 and over, adult: Secondary | ICD-10-CM | POA: Diagnosis not present

## 2021-04-24 DIAGNOSIS — R600 Localized edema: Secondary | ICD-10-CM | POA: Diagnosis not present

## 2021-04-24 DIAGNOSIS — E559 Vitamin D deficiency, unspecified: Secondary | ICD-10-CM | POA: Diagnosis not present

## 2021-04-24 MED ORDER — VITAMIN D (ERGOCALCIFEROL) 1.25 MG (50000 UNIT) PO CAPS: 50000.0000 [IU] | ORAL_CAPSULE | ORAL | 0 refills | Status: DC

## 2021-04-30 DIAGNOSIS — Z23 Encounter for immunization: Secondary | ICD-10-CM | POA: Diagnosis not present

## 2021-04-30 DIAGNOSIS — Z1389 Encounter for screening for other disorder: Secondary | ICD-10-CM | POA: Diagnosis not present

## 2021-04-30 DIAGNOSIS — Z0000 Encounter for general adult medical examination without abnormal findings: Secondary | ICD-10-CM | POA: Diagnosis not present

## 2021-05-01 NOTE — Progress Notes (Signed)
Chief Complaint:   OBESITY Melanie Reeves is here to discuss her progress with her obesity treatment plan along with follow-up of her obesity related diagnoses. Melanie Reeves is on keeping a food journal and adhering to recommended goals of 1300-1600 calories and 85+ grams of protein daily and states she is following her eating plan approximately 40-60% of the time. Melanie Reeves states she is doing 0 minutes 0 times per week.  Today's visit was #: 7 Starting weight: 294 lbs Starting date: 11/29/2020 Today's weight: 282 lbs Today's date: 04/24/2021 Total lbs lost to date: 12 Total lbs lost since last in-office visit: 7  Interim History: Melanie Reeves was hospitalized recently for cellulitis. She isn't journaling but she is trying to portion control and make smarter choices. She sometimes struggles to eat all of her protein.  Subjective:   1. Vitamin D deficiency Melanie Reeves is stable on Vit D, and she is due for labs soon.  2. Bilateral leg edema Melanie Reeves is status post hospitalization for bilateral cellulitis. She is on Lasix and K+ 3 times per week.  Assessment/Plan:   1. Vitamin D deficiency Low Vitamin D level contributes to fatigue and are associated with obesity, breast, and colon cancer. We will refill prescription Vitamin D for 1 month, and we will recheck labs at her next visit. Melanie Reeves will follow-up for routine testing of Vitamin D, at least 2-3 times per year to avoid over-replacement.  - Vitamin D, Ergocalciferol, (DRISDOL) 1.25 MG (50000 UNIT) CAPS capsule; Take 1 capsule (50,000 Units total) by mouth every 7 (seven) days. Saturday  Dispense: 4 capsule; Refill: 0  2. Bilateral leg edema Melanie Reeves will continue to work on diet and exercise to help decrease edema.  3. Obesity with current BMI 53.3 Melanie Reeves is currently in the action stage of change. As such, her goal is to continue with weight loss efforts. She has agreed to keeping a food journal and adhering to recommended goals of 1300-1600 calories and 85+  grams of protein daily.   Behavioral modification strategies: increasing lean protein intake.  Melanie Reeves has agreed to follow-up with our clinic in 3 weeks. She was informed of the importance of frequent follow-up visits to maximize her success with intensive lifestyle modifications for her multiple health conditions.   Objective:   Blood pressure 120/77, pulse 88, temperature 97.7 F (36.5 C), height 5\' 1"  (1.549 m), weight 282 lb (127.9 kg), SpO2 96 %. Body mass index is 53.28 kg/m.  General: Cooperative, alert, well developed, in no acute distress. HEENT: Conjunctivae and lids unremarkable. Cardiovascular: Regular rhythm.  Lungs: Normal work of breathing. Neurologic: No focal deficits.   Lab Results  Component Value Date   CREATININE 0.78 04/05/2021   BUN 28 (H) 04/05/2021   NA 137 04/05/2021   K 3.7 04/05/2021   CL 98 04/05/2021   CO2 29 04/05/2021   Lab Results  Component Value Date   ALT 25 03/27/2021   AST 21 03/27/2021   ALKPHOS 41 03/27/2021   BILITOT 0.3 03/27/2021   Lab Results  Component Value Date   HGBA1C 6.5 03/19/2021   HGBA1C 6.8 (H) 12/18/2020   HGBA1C 8.2 (H) 11/24/2020   HGBA1C 7.7 (H) 09/17/2020   HGBA1C 7.3 (H) 06/08/2020   No results found for: INSULIN Lab Results  Component Value Date   TSH 1.217 11/24/2020   Lab Results  Component Value Date   CHOL 141 09/17/2020   HDL 42.40 09/17/2020   LDLCALC 65 09/17/2020   LDLDIRECT 72.0 02/28/2020  TRIG 127 10/23/2020   CHOLHDL 3 09/17/2020   Lab Results  Component Value Date   VD25OH 19.7 (L) 11/29/2020   Lab Results  Component Value Date   WBC 12.8 (H) 04/04/2021   HGB 10.6 (L) 04/04/2021   HCT 35.8 (L) 04/04/2021   MCV 90.6 04/04/2021   PLT 319 04/04/2021   Lab Results  Component Value Date   FERRITIN 264 10/25/2020    Obesity Behavioral Intervention:   Approximately 15 minutes were spent on the discussion below.  ASK: We discussed the diagnosis of obesity with Melanie Reeves today  and Melanie Reeves agreed to give [place] permission to discuss obesity behavioral modification therapy today.  ASSESS: Melanie Reeves has the diagnosis of obesity and her BMI today is 53.31. Melanie Reeves is in the action stage of change.   ADVISE: Melanie Reeves was educated on the multiple health risks of obesity as well as the benefit of weight loss to improve her health. She was advised of the need for long term treatment and the importance of lifestyle modifications to improve her current health and to decrease her risk of future health problems.  AGREE: Multiple dietary modification options and treatment options were discussed and Melanie Reeves agreed to follow the recommendations documented in the above note.  ARRANGE: Melanie Reeves was educated on the importance of frequent visits to treat obesity as outlined per CMS and USPSTF guidelines and agreed to schedule her next follow up appointment today.  Attestation Statements:   Reviewed by clinician on day of visit: allergies, medications, problem list, medical history, surgical history, family history, social history, and previous encounter notes.   I, Burt Knack, am acting as transcriptionist for Quillian Quince, MD.  I have reviewed the above documentation for accuracy and completeness, and I agree with the above. -  Quillian Quince, MD

## 2021-05-06 ENCOUNTER — Other Ambulatory Visit: Payer: Self-pay | Admitting: Endocrinology

## 2021-05-06 ENCOUNTER — Telehealth: Payer: Self-pay | Admitting: Endocrinology

## 2021-05-06 DIAGNOSIS — E1165 Type 2 diabetes mellitus with hyperglycemia: Secondary | ICD-10-CM

## 2021-05-06 DIAGNOSIS — Z794 Long term (current) use of insulin: Secondary | ICD-10-CM

## 2021-05-06 NOTE — Telephone Encounter (Signed)
MEDICATION: OneTouch Ultra test strip  PHARMACY:  [facility]  HAS THE PATIENT CONTACTED THEIR PHARMACY?  yes  Called pharmacy a week ago, but we have not gotten anything from them  LAST APPOINTMENT DATE: @7 /25/2022  NEXT APPOINTMENT DATE:@10 /12/2020  DO WE HAVE YOUR PERMISSION TO LEAVE A DETAILED MESSAGE?: yes

## 2021-05-07 MED ORDER — ONETOUCH ULTRA VI STRP: ORAL_STRIP | 3 refills | Status: AC

## 2021-05-07 NOTE — Telephone Encounter (Signed)
Ok to approve 

## 2021-05-07 NOTE — Addendum Note (Signed)
Addended by: Eliseo Squires on: 05/07/2021 08:37 AM   Modules accepted: Orders

## 2021-05-08 DIAGNOSIS — E119 Type 2 diabetes mellitus without complications: Secondary | ICD-10-CM | POA: Diagnosis not present

## 2021-05-08 DIAGNOSIS — N179 Acute kidney failure, unspecified: Secondary | ICD-10-CM | POA: Diagnosis not present

## 2021-05-08 DIAGNOSIS — E1165 Type 2 diabetes mellitus with hyperglycemia: Secondary | ICD-10-CM | POA: Diagnosis not present

## 2021-05-08 DIAGNOSIS — E785 Hyperlipidemia, unspecified: Secondary | ICD-10-CM | POA: Diagnosis not present

## 2021-05-08 DIAGNOSIS — M8580 Other specified disorders of bone density and structure, unspecified site: Secondary | ICD-10-CM | POA: Diagnosis not present

## 2021-05-08 DIAGNOSIS — I4891 Unspecified atrial fibrillation: Secondary | ICD-10-CM | POA: Diagnosis not present

## 2021-05-08 DIAGNOSIS — I10 Essential (primary) hypertension: Secondary | ICD-10-CM | POA: Diagnosis not present

## 2021-05-08 DIAGNOSIS — E1142 Type 2 diabetes mellitus with diabetic polyneuropathy: Secondary | ICD-10-CM | POA: Diagnosis not present

## 2021-05-08 DIAGNOSIS — F321 Major depressive disorder, single episode, moderate: Secondary | ICD-10-CM | POA: Diagnosis not present

## 2021-05-08 DIAGNOSIS — E1122 Type 2 diabetes mellitus with diabetic chronic kidney disease: Secondary | ICD-10-CM | POA: Diagnosis not present

## 2021-05-08 DIAGNOSIS — N183 Chronic kidney disease, stage 3 unspecified: Secondary | ICD-10-CM | POA: Diagnosis not present

## 2021-05-10 ENCOUNTER — Other Ambulatory Visit: Payer: Self-pay

## 2021-05-10 DIAGNOSIS — L03818 Cellulitis of other sites: Secondary | ICD-10-CM

## 2021-05-20 ENCOUNTER — Other Ambulatory Visit (INDEPENDENT_AMBULATORY_CARE_PROVIDER_SITE_OTHER): Payer: Self-pay | Admitting: Family Medicine

## 2021-05-20 DIAGNOSIS — E559 Vitamin D deficiency, unspecified: Secondary | ICD-10-CM

## 2021-05-20 NOTE — Telephone Encounter (Signed)
Dr.Beasley 

## 2021-05-20 NOTE — Telephone Encounter (Signed)
LAST APPOINTMENT DATE: 04/24/21 NEXT APPOINTMENT DATE: 06/04/21   [id number] [place], [place] - [address] [place] [address] Phone: [phone] Fax: [phone]  [facility] [place] (New Address) - [place], [place] - [address] AT Previously: [address] [address], [place] [address] [place] [address] Phone: [phone] Fax: [phone]  Patient is requesting a refill of the following medications: Pending Prescriptions:                       Disp   Refills   Vitamin D, Ergocalciferol, (DRISDOL) 1.25 *4 caps*0       Sig: Take 1 capsule (50,000 Units total) by mouth every 7          (seven) days. Saturday   Date last filled: 04/23/21 Previously prescribed by [facility]  Lab Results      Component                Value               Date                      HGBA1C                   6.5                 03/19/2021                HGBA1C                   6.8 (H)             12/18/2020                HGBA1C                   8.2 (H)             11/24/2020           Lab Results      Component                Value               Date                      MICROALBUR               2.4 (H)             06/14/2020                LDLCALC                  65                  09/17/2020                CREATININE               0.78                04/05/2021           Lab Results      Component                Value  Date                      VD25OH                   19.7 (L)            11/29/2020            BP Readings from Last 3 Encounters: 04/24/21 : 120/77 04/17/21 : 140/70 04/05/21 : 132/70

## 2021-05-20 NOTE — Progress Notes (Signed)
[facility] [place]  ASSESSMENT / PLAN: 70 y.o. female with chronic venous insufficiency of the lower extremities. She is not a candidate for endovenous ablation of the saphenous veins. She needs to be maintained in compression garments at all times. Keep legs elevated above the level of the heart at rest. She understands the importance of weight loss.  She will continue to work on living a healthier lifestyle.  Follow-up with me as needed.  CHIEF COMPLAINT: Swelling of the bilateral lower extremities  HISTORY OF PRESENT ILLNESS: Melanie Reeves is a 70 y.o. female referred to clinic for evaluation of venous insufficiency.  The patient was recently hospitalized for cellulitis which had failed outpatient management.  She has thankfully recovered from this quite well.  She had covid earlier this year followed by a different viral pneumonia.  She has tried to live a much healthier lifestyle since her [place].  She is working hard at losing weight and exercising more.  She has improved her exercise tolerance tremendously.  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   Qualifier: Diagnosis of  By: Aline Brochure MD, Stanley     Dyspnea 08/14/2014   Edema    Gallbladder problem    GERD (gastroesophageal reflux disease)    Hyperlipidemia    Hypertension    LOW BACK PAIN 12/20/2009   Qualifier: Diagnosis of  By: Aline Brochure MD, Stanley     Morbid obesity ([place])    OSA (obstructive sleep apnea) 08/16/2014   Osteoarthritis    Other fatigue    Palpitations    Seasonal allergies    Shortness of breath    Shortness of breath on exertion    Sinusitis    SPINAL STENOSIS 12/20/2009   Qualifier: Diagnosis of  By: Aline Brochure  MD, Stanley     Stage 3 chronic kidney disease [facility])     Past Surgical History:  Procedure Laterality Date   CHOLECYSTECTOMY     COLONOSCOPY WITH PROPOFOL N/A 11/29/2014   Procedure: COLONOSCOPY WITH PROPOFOL;  Surgeon: Arta Silence, MD;  Location: [facility];  Service: Endoscopy;  Laterality: N/A;   SKIN CANCER EXCISION     TONSILLECTOMY      Family History  Problem Relation Age of Onset   Heart disease Father        MI   Cancer - Lung Father        Small cell Lung CA   Diabetes Father    Hypertension Father    Hyperlipidemia Father    Cancer Father    Depression Father    Anxiety disorder Father    Hypertension Mother    Hyperlipidemia Mother    Stroke Mother    Kidney disease Mother    Depression Mother    Anxiety disorder Mother    Bipolar disorder Mother    Eating disorder Mother    Obesity Mother     Social History   Socioeconomic History   Marital status: Married    Spouse name: Iona Beard   Number of children: 1   Years of education: Not on file   Highest education level: Not on file  Occupational History  Occupation: Retired    Comment: Engineer, materials  Tobacco Use   Smoking status: Former    Packs/day: 2.00    Years: 20.00    Pack years: 40.00    Types: Cigarettes    Quit date: 11/22/1993    Years since quitting: 27.5   Smokeless tobacco: Former  Scientific laboratory technician Use: Never used  Substance and Sexual Activity   Alcohol use: Yes    Alcohol/week: 0.0 standard drinks   Drug use: No   Sexual activity: Not Currently    Partners: Male  Other Topics Concern   Not on file  Social History Narrative   Not on file   Social Determinants of Health   Financial Resource Strain: Not on file  Food Insecurity: Not on file  Transportation Needs: Not on file  Physical Activity: Not on file  Stress: Not on file  Social Connections: Not on file  Intimate Partner Violence: Not on file    Allergies  Allergen Reactions   Simvastatin     Other  reaction(s): leg weakness Other reaction(s): leg weakness   Sulfa Antibiotics Other (See Comments)    Cant remember Other reaction(s): as a child    Current Outpatient Medications  Medication Sig Dispense Refill   atenolol (TENORMIN) 25 MG tablet Take 1 tablet (25 mg total) by mouth daily. 30 tablet 1   Blood Glucose Monitoring Suppl (ONETOUCH VERIO) w/Device KIT UAD to monitor glucose tid; Dx: E11.65 1 kit 0   cyclobenzaprine (FLEXERIL) 5 MG tablet Take 5 mg by mouth 3 (three) times daily as needed for muscle spasms.     diphenhydrAMINE (BENADRYL) 25 mg capsule Take 50 mg by mouth at bedtime as needed for sleep.     DULoxetine (CYMBALTA) 30 MG capsule Take 1 capsule (30 mg total) by mouth at bedtime.     DULoxetine (CYMBALTA) 60 MG capsule Take 60 mg by mouth daily. Taking in the AM     fenofibrate (TRICOR) 145 MG tablet TAKE 1 TABLET BY MOUTH EVERY DAY 90 tablet 1   furosemide (LASIX) 40 MG tablet Take 1 tablet (40 mg total) by mouth every Monday, Wednesday, and Friday. 30 tablet 0   gabapentin (NEURONTIN) 600 MG tablet Take 1,200 mg by mouth 2 (two) times daily. Take 2 tablets (1217m total) by mouth twice daily.     glucose blood (ONETOUCH ULTRA) test strip USE AS INSTRUCTED TO CHECK BLOOD SUGAR THREE TIMES DAILY. 100 strip 3   HYDROcodone-acetaminophen (NORCO/VICODIN) 5-325 MG tablet Take 1 tablet by mouth every 6 (six) hours as needed for moderate pain.     ibuprofen (ADVIL) 200 MG tablet Take 400 mg by mouth every 6 (six) hours as needed for fever.     insulin aspart (NOVOLOG FLEXPEN) 100 UNIT/ML FlexPen Inject 20-40 Units into the skin 3 (three) times daily with meals.     insulin glargine, 2 Unit Dial, (TOUJEO MAX SOLOSTAR) 300 UNIT/ML Solostar Pen Inject 150 Units into the skin daily.     Insulin Pen Needle (B-D ULTRAFINE III SHORT PEN) 31G X 8 MM MISC Use to inject medication 5 times daily. 150 each 3   INVOKANA 300 MG TABS tablet TAKE 1 TABLET (300 MG TOTAL) BY MOUTH DAILY BEFORE  BREAKFAST. 30 tablet 2   liraglutide (VICTOZA) 18 MG/3ML SOPN INJECT 0.3 MLS (1.8 MG TOTAL) INTO THE SKIN DAILY. INJECT ONCE A DAY AT THE SAME TIME 3 mL 1   loratadine (CLARITIN) 10 MG tablet Take 10 mg  by mouth daily.     metFORMIN (GLUCOPHAGE-XR) 500 MG 24 hr tablet TAKE 4 TABLETS (2,000 MG TOTAL) BY MOUTH DAILY WITH SUPPER. 360 tablet 1   Omega-3 Fatty Acids (FISH OIL) 1000 MG CAPS Take 2,000 mg by mouth daily.     potassium chloride (MICRO-K) 10 MEQ CR capsule Take 1 capsule (10 mEq total) by mouth every Monday, Wednesday, and Friday. Along with the lasix     pravastatin (PRAVACHOL) 40 MG tablet Take 1 tablet (40 mg total) by mouth daily.     Vitamin D, Ergocalciferol, (DRISDOL) 1.25 MG (50000 UNIT) CAPS capsule TAKE 1 CAPSULE (50,000 UNITS TOTAL) BY MOUTH EVERY 7 (SEVEN) DAYS. SATURDAY 4 capsule 0   XARELTO 20 MG TABS tablet TAKE 1 TABLET (20 MG TOTAL) BY MOUTH DAILY WITH SUPPER. 90 tablet 1   No current facility-administered medications for this visit.    REVIEW OF SYSTEMS:  _0  denotes positive finding, _1  denotes negative finding Cardiac  Comments:  Chest pain or chest pressure:    Shortness of breath upon exertion:    Short of breath when lying flat:    Irregular heart rhythm:        Vascular    Pain in calf, thigh, or hip brought on by ambulation:    Pain in feet at night that wakes you up from your sleep:     Blood clot in your veins:    Leg swelling:         Pulmonary    Oxygen at home:    Productive cough:     Wheezing:         Neurologic    Sudden weakness in arms or legs:     Sudden numbness in arms or legs:     Sudden onset of difficulty speaking or slurred speech:    Temporary loss of vision in one eye:     Problems with dizziness:         Gastrointestinal    Blood in stool:     Vomited blood:         Genitourinary    Burning when urinating:     Blood in urine:        Psychiatric    Major depression:         Hematologic    Bleeding problems:     Problems with blood clotting too easily:        Skin    Rashes or ulcers:        Constitutional    Fever or chills:      PHYSICAL EXAM Vitals:   05/21/21 1256  BP: (!) 146/84  Pulse: 80  Resp: 20  Temp: 97.9 F (36.6 C)  SpO2: 96%  Weight: 287 lb (130.2 kg)  Height: _2  (1.549 m)    Constitutional: Well appearing.  No distress. Appears well nourished.  Neurologic: CN intact.  No focal findings.  No sensory loss. Psychiatric: Mood and affect symmetric and appropriate. Eyes: no icterus. No conjunctival pallor. Ears, nose, throat: Mucous membranes moist. Midline trachea.  Cardiac: Rate and rhythm.  Respiratory: Unlabored. Abdominal: Obese, soft, non-tender, non-distended.  Peripheral vascular: Venous stasis dermatitis of the bilateral lower extremities over the pretibial skin.  1+ edema from the ankles to the knees bilaterally. 2+ Dps. Extremity: no cyanosis. no pallor.  Skin: no gangrene. no ulceration.  Lymphatic: no Stemmer's sign. no palpable lymphadenopathy.  PERTINENT LABORATORY AND RADIOLOGIC DATA  Most recent CBC CBC Latest Ref Rng &  Units 04/04/2021 04/03/2021 04/02/2021  WBC 4.0 - 10.5 K/uL 12.8(H) 13.4(H) 13.1(H)  Hemoglobin 12.0 - 15.0 g/dL 10.6(L) 10.7(L) 10.8(L)  Hematocrit 36.0 - 46.0 % 35.8(L) 35.0(L) 35.1(L)  Platelets 150 - 400 K/uL 319 342 358     Most recent CMP CMP Latest Ref Rng & Units 04/05/2021 04/04/2021 04/03/2021  Glucose 70 - 99 mg/dL 331(H) 293(H) 318(H)  BUN 8 - 23 mg/dL 28(H) 28(H) 31(H)  Creatinine 0.44 - 1.00 mg/dL 0.78 0.79 0.89  Sodium 135 - 145 mmol/L 137 137 136  Potassium 3.5 - 5.1 mmol/L 3.7 3.7 3.4(L)  Chloride 98 - 111 mmol/L 98 98 98  CO2 22 - 32 mmol/L _0 Calcium 8.9 - 10.3 mg/dL 9.7 10.0 9.8  Total Protein 6.5 - 8.1 g/dL - - -  Total Bilirubin 0.3 - 1.2 mg/dL - - -  Alkaline Phos 38 - 126 U/L - - -  AST 15 - 41 U/L - - -  ALT 0 - 44 U/L - - -    Renal function CrCl cannot be calculated (Patient's most recent  lab result is older than the maximum 21 days allowed.).  Hgb A1c MFr Bld (%)  Date Value  03/19/2021 6.5    LDL Cholesterol  Date Value Ref Range Status  09/17/2020 65 0 - 99 mg/dL Final   Direct LDL  Date Value Ref Range Status  02/28/2020 72.0 mg/dL Final    Comment:    Optimal:  <100 mg/dLNear or Above Optimal:  100-129 mg/dLBorderline High:  130-159 mg/dLHigh:  160-189 mg/dLVery High:  >190 mg/dL    Lower Venous Reflux Study   Patient Name:  LADEAN STEINMEYER  Date of Exam:   05/21/2021  Medical Rec #: [id number]       Accession #:    [id number]  Date of Birth: 30-Apr-1951        Patient Gender: F  Patient Age:   16 years  Exam Location:  [facility]  Procedure:      VAS [place] LOWER EXTREMITY VENOUS REFLUX  Referring Phys: Monica Martinez    ---------------------------------------------------------------------------  -----     Indications: Edema .  Other Indications: Cellulitis left lower extremity.   Risk Factors: Obesity.  Comparison Study: No prior exam   Performing Technologist: Alvia Grove RVT      Examination Guidelines: A complete evaluation includes B-mode imaging,  spectral  Doppler, color Doppler, and power Doppler as needed of all accessible  portions  of each vessel. Bilateral testing is considered an integral part of a  complete  examination. Limited examinations for reoccurring indications may be  performed  as noted. The reflux portion of the exam is performed with the patient in  reverse Trendelenburg.  Significant venous reflux is defined as >500 ms in the superficial venous  system, and >1 second in the deep venous system.      +--------------+--------+------+----------+-----------+--------------------  ---+  LEFT          Reflux  Reflux  Reflux   Diameter  Comments                                 No       Yes     Time       cms                                +--------------+--------+------+----------+-----------+--------------------  ---+  CFV                    yes  >1 second            Enlarged lymph node                                                        groin                     +--------------+--------+------+----------+-----------+--------------------  ---+  FV mid        no                                                            +--------------+--------+------+----------+-----------+--------------------  ---+  Popliteal     no                                                            +--------------+--------+------+----------+-----------+--------------------  ---+  GSV at [facility]             yes               0.92                               +--------------+--------+------+----------+-----------+--------------------  ---+  GSV prox thighno                         0.55                               +--------------+--------+------+----------+-----------+--------------------  ---+  GSV mid thigh no                         0.58                               +--------------+--------+------+----------+-----------+--------------------  ---+  GSV dist thighno                         0.49                               +--------------+--------+------+----------+-----------+--------------------  ---+  GSV at knee   no                         0.62                               +--------------+--------+------+----------+-----------+--------------------  ---+  GSV prox calf no  0.23                               +--------------+--------+------+----------+-----------+--------------------  ---+  GSV mid calf                                     NV                         +--------------+--------+------+----------+-----------+--------------------  ---+  SSV Pop Fossa no                         0.42                                +--------------+--------+------+----------+-----------+--------------------  ---+  SSV prox calf no                         0.24                               +--------------+--------+------+----------+-----------+--------------------  ---+  SSV mid calf  no                         0.36                               +--------------+--------+------+----------+-----------+--------------------  ---+  AASV prox     no                         0.25                               +--------------+--------+------+----------+-----------+--------------------  ---+           Summary:  Left:  - No evidence of deep vein thrombosis seen in the left lower extremity,  from the common femoral through the popliteal veins.  - No evidence of superficial venous reflux seen in the left greater  saphenous vein.  - No evidence of superficial venous reflux seen in the left short  saphenous vein.  - Venous reflux is noted in the left common femoral vein.  - Venous reflux is noted in the left sapheno-femoral junction.     *See table(s) above for measurements and observations.   Electronically signed by Jamelle Haring on 05/21/2021 at 1:05:23 PM.       Yevonne Aline. Stanford Breed, MD [facility] [place] Phone Number: [phone] 05/21/2021 4:05 PM

## 2021-05-21 ENCOUNTER — Encounter: Payer: Self-pay | Admitting: Vascular Surgery

## 2021-05-21 ENCOUNTER — Other Ambulatory Visit: Payer: Self-pay

## 2021-05-21 ENCOUNTER — Ambulatory Visit: Payer: PPO | Admitting: Vascular Surgery

## 2021-05-21 ENCOUNTER — Ambulatory Visit (HOSPITAL_COMMUNITY)
Admission: RE | Admit: 2021-05-21 | Discharge: 2021-05-21 | Disposition: A | Discharge status: Home / Self Care | Payer: PPO | Source: Ambulatory Visit | Attending: Vascular Surgery | Admitting: Vascular Surgery

## 2021-05-21 VITALS — BP 146/84 | HR 80 | Temp 97.9°F | Resp 20 | Ht 61.0 in | Wt 287.0 lb

## 2021-05-21 DIAGNOSIS — L03818 Cellulitis of other sites: Secondary | ICD-10-CM | POA: Insufficient documentation

## 2021-05-21 DIAGNOSIS — I872 Venous insufficiency (chronic) (peripheral): Secondary | ICD-10-CM | POA: Diagnosis not present

## 2021-05-21 DIAGNOSIS — M7989 Other specified soft tissue disorders: Secondary | ICD-10-CM

## 2021-06-04 ENCOUNTER — Ambulatory Visit (INDEPENDENT_AMBULATORY_CARE_PROVIDER_SITE_OTHER): Payer: PPO | Admitting: Family Medicine

## 2021-06-05 ENCOUNTER — Encounter (INDEPENDENT_AMBULATORY_CARE_PROVIDER_SITE_OTHER): Payer: Self-pay | Admitting: Family Medicine

## 2021-06-05 ENCOUNTER — Other Ambulatory Visit: Payer: Self-pay

## 2021-06-05 ENCOUNTER — Ambulatory Visit (INDEPENDENT_AMBULATORY_CARE_PROVIDER_SITE_OTHER): Payer: PPO | Admitting: Family Medicine

## 2021-06-05 VITALS — BP 135/78 | HR 86 | Temp 97.8°F | Ht 61.0 in | Wt 287.0 lb

## 2021-06-05 DIAGNOSIS — Z794 Long term (current) use of insulin: Secondary | ICD-10-CM

## 2021-06-05 DIAGNOSIS — I509 Heart failure, unspecified: Secondary | ICD-10-CM | POA: Diagnosis not present

## 2021-06-05 DIAGNOSIS — E1169 Type 2 diabetes mellitus with other specified complication: Secondary | ICD-10-CM | POA: Diagnosis not present

## 2021-06-05 DIAGNOSIS — Z6841 Body Mass Index (BMI) 40.0 and over, adult: Secondary | ICD-10-CM | POA: Diagnosis not present

## 2021-06-05 DIAGNOSIS — E559 Vitamin D deficiency, unspecified: Secondary | ICD-10-CM | POA: Diagnosis not present

## 2021-06-05 MED ORDER — VITAMIN D (ERGOCALCIFEROL) 1.25 MG (50000 UNIT) PO CAPS: 50000.0000 [IU] | ORAL_CAPSULE | ORAL | 0 refills | Status: DC

## 2021-06-06 DIAGNOSIS — E785 Hyperlipidemia, unspecified: Secondary | ICD-10-CM | POA: Diagnosis not present

## 2021-06-06 DIAGNOSIS — M8580 Other specified disorders of bone density and structure, unspecified site: Secondary | ICD-10-CM | POA: Diagnosis not present

## 2021-06-06 DIAGNOSIS — I10 Essential (primary) hypertension: Secondary | ICD-10-CM | POA: Diagnosis not present

## 2021-06-06 DIAGNOSIS — N179 Acute kidney failure, unspecified: Secondary | ICD-10-CM | POA: Diagnosis not present

## 2021-06-06 DIAGNOSIS — I4891 Unspecified atrial fibrillation: Secondary | ICD-10-CM | POA: Diagnosis not present

## 2021-06-06 DIAGNOSIS — E1142 Type 2 diabetes mellitus with diabetic polyneuropathy: Secondary | ICD-10-CM | POA: Diagnosis not present

## 2021-06-06 DIAGNOSIS — N183 Chronic kidney disease, stage 3 unspecified: Secondary | ICD-10-CM | POA: Diagnosis not present

## 2021-06-06 DIAGNOSIS — E119 Type 2 diabetes mellitus without complications: Secondary | ICD-10-CM | POA: Diagnosis not present

## 2021-06-06 DIAGNOSIS — E1122 Type 2 diabetes mellitus with diabetic chronic kidney disease: Secondary | ICD-10-CM | POA: Diagnosis not present

## 2021-06-06 LAB — CMP14+EGFR
ALT: 21 IU/L (ref 0–32)
AST: 21 IU/L (ref 0–40)
Albumin/Globulin Ratio: 1.5 (ref 1.2–2.2)
Albumin: 4.4 g/dL (ref 3.8–4.8)
Alkaline Phosphatase: 56 IU/L (ref 44–121)
BUN/Creatinine Ratio: 36 — ABNORMAL HIGH (ref 12–28)
BUN: 31 mg/dL — ABNORMAL HIGH (ref 8–27)
Bilirubin Total: <0.2 mg/dL (ref 0.0–1.2)
CO2: 19 mmol/L — ABNORMAL LOW (ref 20–29)
Calcium: 9.9 mg/dL (ref 8.7–10.3)
Chloride: 102 mmol/L (ref 96–106)
Creatinine, Ser: 0.85 mg/dL (ref 0.57–1.00)
Globulin, Total: 2.9 g/dL (ref 1.5–4.5)
Glucose: 139 mg/dL — ABNORMAL HIGH (ref 65–99)
Potassium: 4.9 mmol/L (ref 3.5–5.2)
Sodium: 139 mmol/L (ref 134–144)
Total Protein: 7.3 g/dL (ref 6.0–8.5)
eGFR: 75 mL/min/{1.73_m2} (ref >59)

## 2021-06-06 LAB — VITAMIN D 25 HYDROXY (VIT D DEFICIENCY, FRACTURES): Vit D, 25-Hydroxy: 36.2 ng/mL (ref 30.0–100.0)

## 2021-06-06 NOTE — Progress Notes (Signed)
Chief Complaint:   OBESITY Melanie Reeves is here to discuss her progress with her obesity treatment plan along with follow-up of her obesity related diagnoses. Melanie Reeves is on keeping a food journal and adhering to recommended goals of 1300-1600 calories and 85+ grams of protein daily and states she is following her eating plan approximately 30-70% of the time. Melanie Reeves states she is doing 0 minutes 0 times per week.  Today's visit was #: 8 Starting weight: 294 lbs Starting date: 11/29/2020 Today's weight: 287 lbs Today's date: 06/05/2021 Total lbs lost to date: 7 Total lbs lost since last in-office visit: 0  Interim History: Melanie Reeves was on vacation over her birthday and she did some celebration eating. She still was able to portion control and make smarter choices while on vacation, and she did well avoiding weight gain.  Subjective:   1. Other congestive heart failure (Melanie Reeves) Melanie Reeves forgot her Lasix today, and she notes feeling more bloated, worse with recent travel. She is stable on her other medications for diabetes mellitus which also are cardio protective.  2. Vitamin D deficiency Melanie Reeves is stable on Vit D.  3. Type 2 diabetes mellitus with other specified complication, with long-term current use of insulin (HCC) Melanie Reeves is working on diet and exercise. She is followed by her Endocrinologist Dr. Dwyane Reeves.  Assessment/Plan:   1. Other congestive heart failure (Melanie Reeves) Melanie Reeves will continue Invokana, Victoza, and Lasix, and she will continue with diet and exercise.  2. Vitamin D deficiency Low Vitamin D level contributes to fatigue and are associated with obesity, breast, and colon cancer. We will check labs today, and we will refill prescription Vitamin D for 1 month. Melanie Reeves will follow-up for routine testing of Vitamin D, at least 2-3 times per year to avoid over-replacement.  - Vitamin D, Ergocalciferol, (DRISDOL) 1.25 MG (50000 UNIT) CAPS capsule; Take 1 capsule (50,000 Units total) by mouth every 7  (seven) days. Saturday  Dispense: 4 capsule; Refill: 0 - VITAMIN D 25 Hydroxy (Vit-D Deficiency, Fractures)  3. Type 2 diabetes mellitus with other specified complication, with long-term current use of insulin (Melanie Reeves) We will check labs today, and Melanie Reeves will continue diet and exercise, and will continue to follow up as directed. Good blood sugar control is important to decrease the likelihood of diabetic complications such as nephropathy, neuropathy, limb loss, blindness, coronary artery disease, and death. Intensive lifestyle modification including diet, exercise and weight loss are the first line of treatment for diabetes.   - CMP14+EGFR  4. Obesity with current BMI 54.2 Melanie Reeves is currently in the action stage of change. As such, her goal is to continue with weight loss efforts. She has agreed to keeping a food journal and adhering to recommended goals of 1300-1600 calories and 85+ grams of protein daily.   Behavioral modification strategies: no skipping meals.  Melanie Reeves has agreed to follow-up with our clinic in 3 weeks. She was informed of the importance of frequent follow-up visits to maximize her success with intensive lifestyle modifications for her multiple health conditions.   Objective:   Blood pressure 135/78, pulse 86, temperature 97.8 F (36.6 C), height _0  (1.549 m), weight 287 lb (130.2 kg), SpO2 96 %. Body mass index is 54.23 kg/m.  General: Cooperative, alert, well developed, in no acute distress. HEENT: Conjunctivae and lids unremarkable. Cardiovascular: Regular rhythm.  Lungs: Normal work of breathing. Neurologic: No focal deficits.   Lab Results  Component Value Date   CREATININE 0.85 06/05/2021   BUN 31 (  H) 06/05/2021   NA 139 06/05/2021   K 4.9 06/05/2021   CL 102 06/05/2021   CO2 19 (L) 06/05/2021   Lab Results  Component Value Date   ALT 21 06/05/2021   AST 21 06/05/2021   ALKPHOS 56 06/05/2021   BILITOT <0.2 06/05/2021   Lab Results  Component Value  Date   HGBA1C 6.5 03/19/2021   HGBA1C 6.8 (H) 12/18/2020   HGBA1C 8.2 (H) 11/24/2020   HGBA1C 7.7 (H) 09/17/2020   HGBA1C 7.3 (H) 06/08/2020   No results found for: INSULIN Lab Results  Component Value Date   TSH 1.217 11/24/2020   Lab Results  Component Value Date   CHOL 141 09/17/2020   HDL 42.40 09/17/2020   LDLCALC 65 09/17/2020   LDLDIRECT 72.0 02/28/2020   TRIG 127 10/23/2020   CHOLHDL 3 09/17/2020   Lab Results  Component Value Date   VD25OH 36.2 06/05/2021   VD25OH 19.7 (L) 11/29/2020   Lab Results  Component Value Date   WBC 12.8 (H) 04/04/2021   HGB 10.6 (L) 04/04/2021   HCT 35.8 (L) 04/04/2021   MCV 90.6 04/04/2021   PLT 319 04/04/2021   Lab Results  Component Value Date   FERRITIN 264 10/25/2020   Attestation Statements:   Reviewed by clinician on day of visit: allergies, medications, problem list, medical history, surgical history, family history, social history, and previous encounter notes.  Time spent on visit including pre-visit chart review and post-visit care and charting was 40 minutes.    I, Trixie Dredge, am acting as transcriptionist for Dennard Nip, MD.  I have reviewed the above documentation for accuracy and completeness, and I agree with the above. -  Dennard Nip, MD

## 2021-06-12 ENCOUNTER — Other Ambulatory Visit: Payer: Self-pay | Admitting: Endocrinology

## 2021-06-15 ENCOUNTER — Other Ambulatory Visit: Payer: Self-pay | Admitting: Endocrinology

## 2021-06-16 ENCOUNTER — Other Ambulatory Visit (INDEPENDENT_AMBULATORY_CARE_PROVIDER_SITE_OTHER): Payer: Self-pay | Admitting: Adult Health

## 2021-06-16 DIAGNOSIS — E559 Vitamin D deficiency, unspecified: Secondary | ICD-10-CM

## 2021-06-18 NOTE — Telephone Encounter (Signed)
Dr.Beasley 

## 2021-06-20 ENCOUNTER — Other Ambulatory Visit: Payer: Self-pay | Admitting: Endocrinology

## 2021-06-24 DIAGNOSIS — E1142 Type 2 diabetes mellitus with diabetic polyneuropathy: Secondary | ICD-10-CM | POA: Diagnosis not present

## 2021-06-24 DIAGNOSIS — M8580 Other specified disorders of bone density and structure, unspecified site: Secondary | ICD-10-CM | POA: Diagnosis not present

## 2021-06-24 DIAGNOSIS — D72829 Elevated white blood cell count, unspecified: Secondary | ICD-10-CM | POA: Diagnosis not present

## 2021-06-24 DIAGNOSIS — M1990 Unspecified osteoarthritis, unspecified site: Secondary | ICD-10-CM | POA: Diagnosis not present

## 2021-06-24 DIAGNOSIS — M542 Cervicalgia: Secondary | ICD-10-CM | POA: Diagnosis not present

## 2021-06-24 DIAGNOSIS — E1165 Type 2 diabetes mellitus with hyperglycemia: Secondary | ICD-10-CM | POA: Diagnosis not present

## 2021-06-24 DIAGNOSIS — E1122 Type 2 diabetes mellitus with diabetic chronic kidney disease: Secondary | ICD-10-CM | POA: Diagnosis not present

## 2021-06-24 DIAGNOSIS — E1136 Type 2 diabetes mellitus with diabetic cataract: Secondary | ICD-10-CM | POA: Diagnosis not present

## 2021-06-24 DIAGNOSIS — I4891 Unspecified atrial fibrillation: Secondary | ICD-10-CM | POA: Diagnosis not present

## 2021-06-24 DIAGNOSIS — N1831 Chronic kidney disease, stage 3a: Secondary | ICD-10-CM | POA: Diagnosis not present

## 2021-06-24 DIAGNOSIS — I10 Essential (primary) hypertension: Secondary | ICD-10-CM | POA: Diagnosis not present

## 2021-06-25 DIAGNOSIS — H31013 Macula scars of posterior pole (postinflammatory) (post-traumatic), bilateral: Secondary | ICD-10-CM | POA: Diagnosis not present

## 2021-06-25 DIAGNOSIS — H2513 Age-related nuclear cataract, bilateral: Secondary | ICD-10-CM | POA: Diagnosis not present

## 2021-06-25 DIAGNOSIS — E119 Type 2 diabetes mellitus without complications: Secondary | ICD-10-CM | POA: Diagnosis not present

## 2021-07-01 ENCOUNTER — Other Ambulatory Visit: Payer: Self-pay

## 2021-07-01 ENCOUNTER — Encounter (INDEPENDENT_AMBULATORY_CARE_PROVIDER_SITE_OTHER): Payer: Self-pay | Admitting: Family Medicine

## 2021-07-01 ENCOUNTER — Ambulatory Visit (INDEPENDENT_AMBULATORY_CARE_PROVIDER_SITE_OTHER): Payer: PPO | Admitting: Family Medicine

## 2021-07-01 VITALS — BP 113/72 | HR 78 | Temp 98.0°F | Ht 61.0 in | Wt 278.0 lb

## 2021-07-01 DIAGNOSIS — Z794 Long term (current) use of insulin: Secondary | ICD-10-CM

## 2021-07-01 DIAGNOSIS — Z6841 Body Mass Index (BMI) 40.0 and over, adult: Secondary | ICD-10-CM | POA: Diagnosis not present

## 2021-07-01 DIAGNOSIS — E1169 Type 2 diabetes mellitus with other specified complication: Secondary | ICD-10-CM

## 2021-07-01 DIAGNOSIS — E559 Vitamin D deficiency, unspecified: Secondary | ICD-10-CM | POA: Diagnosis not present

## 2021-07-01 MED ORDER — VITAMIN D (ERGOCALCIFEROL) 1.25 MG (50000 UNIT) PO CAPS: 50000.0000 [IU] | ORAL_CAPSULE | ORAL | 0 refills | Status: DC

## 2021-07-01 NOTE — Progress Notes (Signed)
Chief Complaint:   OBESITY Melanie Reeves is here to discuss her progress with her obesity treatment plan along with follow-up of her obesity related diagnoses. Melanie Reeves is on keeping a food journal and adhering to recommended goals of 1300-1600 calories and 85+ grams of protein daily and states she is following her eating plan approximately 50% of the time. Melanie Reeves states she is doing 0 minutes 0 times per week.  Today's visit was #: 9 Starting weight: 294 lbs Starting date: 11/29/2020 Today's weight: 278 lbs Today's date: 07/01/2021 Total lbs lost to date: 16 Total lbs lost since last in-office visit: 9  Interim History: Melanie Reeves continues to work on diet and weight loss. She is trying to be more active but she is not doing formal exercise. She is mindful of her food choices but she doesn't journal much.  Subjective:   1. Vitamin D deficiency Melanie Reeves is stable on Vit D, and she is due for a refill.  2. Type 2 diabetes mellitus with other specified complication, with long-term current use of insulin (HCC) Melanie Reeves is on [employer], and Victoza. She has a low glucose of 77, and ranging to 216 with and average of below 150.   Assessment/Plan:   1. Vitamin D deficiency Low Vitamin D level contributes to fatigue and are associated with obesity, breast, and colon cancer. We will refill prescription Vitamin D for 1 month. Melanie Reeves will follow-up for routine testing of Vitamin D, at least 2-3 times per year to avoid over-replacement.  - Vitamin D, Ergocalciferol, (Melanie Reeves) 1.25 MG (50000 UNIT) CAPS capsule; Take 1 capsule (50,000 Units total) by mouth every 7 (seven) days. Saturday  Dispense: 4 capsule; Refill: 0  2. Type 2 diabetes mellitus with other specified complication, with long-term current use of insulin (HCC) Melanie Reeves will continue her medications as is and make sure to follow up with Dr. Lucianne Muss as scheduled. Good blood sugar control is important to decrease the likelihood of diabetic complications  such as nephropathy, neuropathy, limb loss, blindness, coronary artery disease, and death. Intensive lifestyle modification including diet, exercise and weight loss are the first line of treatment for diabetes.   3. Obesity with current BMI 52.6 Melanie Reeves is currently in the action stage of change. As such, her goal is to continue with weight loss efforts. She has agreed to keeping a food journal and adhering to recommended goals of 1300-1600 calories and 85+ grams of protein daily.   Behavioral modification strategies: increasing lean protein intake and meal planning and cooking strategies.  Melanie Reeves has agreed to follow-up with our clinic in 3 weeks. She was informed of the importance of frequent follow-up visits to maximize her success with intensive lifestyle modifications for her multiple health conditions.   Objective:   Blood pressure 113/72, pulse 78, temperature 98 F (36.7 C), height 5\' 1"  (1.549 m), weight 278 lb (126.1 kg), SpO2 96 %. Body mass index is 52.53 kg/m.  General: Cooperative, alert, well developed, in no acute distress. HEENT: Conjunctivae and lids unremarkable. Cardiovascular: Regular rhythm.  Lungs: Normal work of breathing. Neurologic: No focal deficits.   Lab Results  Component Value Date   CREATININE 0.85 06/05/2021   BUN 31 (H) 06/05/2021   NA 139 06/05/2021   K 4.9 06/05/2021   CL 102 06/05/2021   CO2 19 (L) 06/05/2021   Lab Results  Component Value Date   ALT 21 06/05/2021   AST 21 06/05/2021   ALKPHOS 56 06/05/2021   BILITOT <0.2 06/05/2021  Lab Results  Component Value Date   HGBA1C 6.5 03/19/2021   HGBA1C 6.8 (H) 12/18/2020   HGBA1C 8.2 (H) 11/24/2020   HGBA1C 7.7 (H) 09/17/2020   HGBA1C 7.3 (H) 06/08/2020   No results found for: INSULIN Lab Results  Component Value Date   TSH 1.217 11/24/2020   Lab Results  Component Value Date   CHOL 141 09/17/2020   HDL 42.40 09/17/2020   LDLCALC 65 09/17/2020   LDLDIRECT 72.0 02/28/2020   TRIG  127 10/23/2020   CHOLHDL 3 09/17/2020   Lab Results  Component Value Date   VD25OH 36.2 06/05/2021   VD25OH 19.7 (L) 11/29/2020   Lab Results  Component Value Date   WBC 12.8 (H) 04/04/2021   HGB 10.6 (L) 04/04/2021   HCT 35.8 (L) 04/04/2021   MCV 90.6 04/04/2021   PLT 319 04/04/2021   Lab Results  Component Value Date   FERRITIN 264 10/25/2020    Obesity Behavioral Intervention:   Approximately 15 minutes were spent on the discussion below.  ASK: We discussed the diagnosis of obesity with Melanie Reeves today and Melanie Reeves agreed to give [place] permission to discuss obesity behavioral modification therapy today.  ASSESS: Melanie Reeves has the diagnosis of obesity and her BMI today is 52.6. [name] is in the action stage of change.   ADVISE: Melanie Reeves was educated on the multiple health risks of obesity as well as the benefit of weight loss to improve her health. She was advised of the need for long term treatment and the importance of lifestyle modifications to improve her current health and to decrease her risk of future health problems.  AGREE: Multiple dietary modification options and treatment options were discussed and Melanie Reeves agreed to follow the recommendations documented in the above note.  ARRANGE: Melanie Reeves was educated on the importance of frequent visits to treat obesity as outlined per CMS and USPSTF guidelines and agreed to schedule her next follow up appointment today.  Attestation Statements:   Reviewed by clinician on day of visit: allergies, medications, problem list, medical history, surgical history, family history, social history, and previous encounter notes.   I, Burt Knack, am acting as transcriptionist for Quillian Quince, MD.  I have reviewed the above documentation for accuracy and completeness, and I agree with the above. -  Quillian Quince, MD

## 2021-07-15 ENCOUNTER — Other Ambulatory Visit (INDEPENDENT_AMBULATORY_CARE_PROVIDER_SITE_OTHER): Payer: PPO

## 2021-07-15 ENCOUNTER — Other Ambulatory Visit: Payer: Self-pay

## 2021-07-15 DIAGNOSIS — Z794 Long term (current) use of insulin: Secondary | ICD-10-CM | POA: Diagnosis not present

## 2021-07-15 DIAGNOSIS — E1165 Type 2 diabetes mellitus with hyperglycemia: Secondary | ICD-10-CM

## 2021-07-15 LAB — LIPID PANEL
Cholesterol: 140 mg/dL (ref 0–200)
HDL: 49.5 mg/dL (ref >39.00)
LDL Cholesterol: 60 mg/dL (ref 0–99)
NonHDL: 90.48
Total CHOL/HDL Ratio: 3
Triglycerides: 153 mg/dL — ABNORMAL HIGH (ref 0.0–149.0)
VLDL: 30.6 mg/dL (ref 0.0–40.0)

## 2021-07-15 LAB — MICROALBUMIN / CREATININE URINE RATIO
Creatinine,U: 47.3 mg/dL
Microalb Creat Ratio: 1.5 mg/g (ref 0.0–30.0)
Microalb, Ur: <0.7 mg/dL (ref 0.0–1.9)

## 2021-07-15 LAB — COMPREHENSIVE METABOLIC PANEL
ALT: 20 U/L (ref 0–35)
AST: 20 U/L (ref 0–37)
Albumin: 4.3 g/dL (ref 3.5–5.2)
Alkaline Phosphatase: 44 U/L (ref 39–117)
BUN: 33 mg/dL — ABNORMAL HIGH (ref 6–23)
CO2: 27 mEq/L (ref 19–32)
Calcium: 10.3 mg/dL (ref 8.4–10.5)
Chloride: 101 mEq/L (ref 96–112)
Creatinine, Ser: 1.02 mg/dL (ref 0.40–1.20)
GFR: 56.66 mL/min — ABNORMAL LOW (ref >60.00)
Glucose, Bld: 128 mg/dL — ABNORMAL HIGH (ref 70–99)
Potassium: 4.9 mEq/L (ref 3.5–5.1)
Sodium: 138 mEq/L (ref 135–145)
Total Bilirubin: 0.3 mg/dL (ref 0.2–1.2)
Total Protein: 7.9 g/dL (ref 6.0–8.3)

## 2021-07-15 LAB — HEMOGLOBIN A1C: Hgb A1c MFr Bld: 6.5 % (ref 4.6–6.5)

## 2021-07-18 ENCOUNTER — Ambulatory Visit (INDEPENDENT_AMBULATORY_CARE_PROVIDER_SITE_OTHER): Payer: PPO | Admitting: Endocrinology

## 2021-07-18 ENCOUNTER — Other Ambulatory Visit: Payer: Self-pay

## 2021-07-18 ENCOUNTER — Other Ambulatory Visit: Payer: Self-pay | Admitting: Endocrinology

## 2021-07-18 VITALS — BP 128/76 | HR 83 | Ht 61.0 in | Wt 286.0 lb

## 2021-07-18 DIAGNOSIS — Z794 Long term (current) use of insulin: Secondary | ICD-10-CM

## 2021-07-18 DIAGNOSIS — E1165 Type 2 diabetes mellitus with hyperglycemia: Secondary | ICD-10-CM | POA: Diagnosis not present

## 2021-07-18 DIAGNOSIS — E1142 Type 2 diabetes mellitus with diabetic polyneuropathy: Secondary | ICD-10-CM | POA: Diagnosis not present

## 2021-07-18 NOTE — Patient Instructions (Signed)
Check blood sugars on waking up 4 days a week  Also check blood sugars about 2 hours after meals and do this after different meals by rotation  Recommended blood sugar levels on waking up are 90-130 and about 2 hours after meal is 130-180  Please bring your blood sugar monitor to each visit, thank you  

## 2021-07-18 NOTE — Progress Notes (Signed)
Patient ID: Melanie Reeves, female   DOB: [birth year], 70 y.o.   MRN: [id number]           Reason for Appointment: Follow-up  for Type 2 Diabetes  Referring physician: Yaakov Guthrie   History of Present Illness:          Date of diagnosis of type 2 diabetes mellitus:  2011       Background history:     She was tested for diabetes when she presented with numbness and tingling in her feet to the health Department. She does not know what her blood sugars were initially  She was however started on both metformin and glipizide at that time. She thinks that in early 2015 she was started on Lantus insulin also developed poor control  Her record indicates A1c levels ranging from 8.2-9.3 and higher at 11.4 in 9/16 She had been started on Victoza in 12/16  Recent history:   INSULIN regimen is: Toujeo 154 U in am, NovoLog 20- 40 AC  Non-insulin hypoglycemic drugs the patient is taking are: Invokana 300 mg,  Metformin ER 2000 mg daily, Victoza 1.58m daily  A1c is 6.5 and unchanged   Current blood sugar patterns and problems identified: She has had overall better fasting blood sugars since her last visit  Her basal insulin was increased by only 4 units on the last visit  Overall she thinks she is motivated to watch her diet and cut back on a lot of carbohydrates and high fat foods and snacks  She is however not able to lose weight  This is despite recently being able to be a little more mobile  Again because of cost issues she has not pursued getting the Dexcom  Previously freestyle lElenor [place] not accurate She is forgetting to do her blood sugars after meals despite reminders However is very consistent with taking her NovoLog at mealtimes and she thinks she is adjusting the dose based on how much she is eating and what kind of carbohydrate she is getting No hypoglycemia   Dinner variable, usually 10 pm   Side effects from medications have been: none  Blood sugar readings and averages  from download of the One Touch meter as follows    PRE-MEAL Fasting Lunch Dinner Bedtime Overall  Glucose range: 103-151  92-184 112-154 77-184  Mean/median: 129  135  126   POST-MEAL PC Breakfast PC Lunch PC Dinner  Glucose range:   ?  Mean/median:      Previously:  PRE-MEAL Fasting Lunch Dinner Bedtime Overall  Glucose range: 114-182  103-190    Mean/median: 148  148  146   POST-MEAL PC Breakfast PC Lunch PC Dinner  Glucose range:   98-285  Mean/median:         Self-care: The diet that the patient has been following is: None, not restricting carbohydrates and fats;   eating more starchy foods and bread at times    Meal times: Breakfast:10-11 AM Lunch:4 PM Dinner: 7 pm    Typical meal intake: Breakfast is eggs/ low fat meat..  Lunch.  Evening meal is meat, vegetables,, sometimes [employer]               Dietician visit, most recent: Years ago                Weight history: Lowest 178 lb several years ago, more recently 300-330  Wt Readings from Last 3 Encounters:  07/18/21 286 lb (129.7 kg)  07/01/21 278  lb (126.1 kg)  06/05/21 287 lb (130.2 kg)   Glycemic control:   Lab Results  Component Value Date   HGBA1C 6.5 07/15/2021   HGBA1C 6.5 03/19/2021   HGBA1C 6.8 (H) 12/18/2020   Lab Results  Component Value Date   MICROALBUR <0.7 07/15/2021   LDLCALC 60 07/15/2021   CREATININE 1.02 07/15/2021      Lab on 07/15/2021  Component Date Value Ref Range Status   Cholesterol 07/15/2021 140  0 - 200 mg/dL Final   ATP III Classification       Desirable:  < 200 mg/dL               Borderline High:  200 - 239 mg/dL          High:  > = 240 mg/dL   Triglycerides 07/15/2021 153.0 (A) 0.0 - 149.0 mg/dL Final   Normal:  <150 mg/dLBorderline High:  150 - 199 mg/dL   HDL 07/15/2021 49.50  >39.00 mg/dL Final   VLDL 07/15/2021 30.6  0.0 - 40.0 mg/dL Final   LDL Cholesterol 07/15/2021 60  0 - 99 mg/dL Final   Total CHOL/HDL Ratio 07/15/2021 3   Final                  Men           Women1/2 Average Risk     3.4          3.3Average Risk          5.0          4.42X Average Risk          9.6          7.13X Average Risk          15.0          11.0                       NonHDL 07/15/2021 90.48   Final   NOTE:  Non-HDL goal should be 30 mg/dL higher than patient's LDL goal (i.e. LDL goal of < 70 mg/dL, would have non-HDL goal of < 100 mg/dL)   Sodium 07/15/2021 138  135 - 145 mEq/L Final   Potassium 07/15/2021 4.9  3.5 - 5.1 mEq/L Final   Chloride 07/15/2021 101  96 - 112 mEq/L Final   CO2 07/15/2021 27  19 - 32 mEq/L Final   Glucose, Bld 07/15/2021 128 (A) 70 - 99 mg/dL Final   BUN 07/15/2021 33 (A) 6 - 23 mg/dL Final   Creatinine, Ser 07/15/2021 1.02  0.40 - 1.20 mg/dL Final   Total Bilirubin 07/15/2021 0.3  0.2 - 1.2 mg/dL Final   Alkaline Phosphatase 07/15/2021 44  39 - 117 U/L Final   AST 07/15/2021 20  0 - 37 U/L Final   ALT 07/15/2021 20  0 - 35 U/L Final   Total Protein 07/15/2021 7.9  6.0 - 8.3 g/dL Final   Albumin 07/15/2021 4.3  3.5 - 5.2 g/dL Final   GFR 07/15/2021 56.66 (A) >60.00 mL/min Final   Calculated using the CKD-EPI Creatinine Equation (2021)   Calcium 07/15/2021 10.3  8.4 - 10.5 mg/dL Final   Hgb A1c MFr Bld 07/15/2021 6.5  4.6 - 6.5 % Final   Glycemic Control Guidelines for People with Diabetes:Non Diabetic:  <6%Goal of Therapy: <7%Additional Action Suggested:  >8%    Microalb, Ur 07/15/2021 <0.7  0.0 - 1.9 mg/dL Final   Creatinine,U 07/15/2021 47.3  mg/dL Final   Microalb Creat Ratio 07/15/2021 1.5  0.0 - 30.0 mg/g Final      Allergies as of 07/18/2021       Reactions   Simvastatin    Other reaction(s): leg weakness Other reaction(s): leg weakness   Sulfa Antibiotics Other (See Comments)   Cant remember Other reaction(s): as a child        Medication List        Accurate as of July 18, 2021  2:18 PM. If you have any questions, ask your nurse or doctor.          atenolol 25 MG tablet Commonly known as:  TENORMIN Take 1 tablet (25 mg total) by mouth daily.   B-D ULTRAFINE III SHORT PEN 31G X 8 MM Misc Generic drug: Insulin Pen Needle USE TO INJECT MEDICATION 5 TIMES DAILY.   cyclobenzaprine 5 MG tablet Commonly known as: FLEXERIL Take 5 mg by mouth 3 (three) times daily as needed for muscle spasms.   diphenhydrAMINE 25 mg capsule Commonly known as: BENADRYL Take 50 mg by mouth at bedtime as needed for sleep.   DULoxetine 60 MG capsule Commonly known as: CYMBALTA Take 60 mg by mouth daily. Taking in the AM   DULoxetine 30 MG capsule Commonly known as: CYMBALTA Take 1 capsule (30 mg total) by mouth at bedtime.   fenofibrate 145 MG tablet Commonly known as: TRICOR TAKE 1 TABLET BY MOUTH EVERY DAY   Fish Oil 1000 MG Caps Take 2,000 mg by mouth daily.   furosemide 40 MG tablet Commonly known as: LASIX Take 1 tablet (40 mg total) by mouth every Monday, Wednesday, and Friday.   gabapentin 600 MG tablet Commonly known as: NEURONTIN TAKE 1 TABLET BY MOUTH 3 TIMES A DAY AND 1 TABLET AT BEDTIME   HYDROcodone-acetaminophen 5-325 MG tablet Commonly known as: NORCO/VICODIN Take 1 tablet by mouth every 6 (six) hours as needed for moderate pain.   ibuprofen 200 MG tablet Commonly known as: ADVIL Take 400 mg by mouth every 6 (six) hours as needed for fever.   Invokana 300 MG Tabs tablet Generic drug: canagliflozin TAKE 1 TABLET (300 MG TOTAL) BY MOUTH DAILY BEFORE BREAKFAST.   loratadine 10 MG tablet Commonly known as: CLARITIN Take 10 mg by mouth daily.   metFORMIN 500 MG 24 hr tablet Commonly known as: GLUCOPHAGE-XR TAKE 4 TABLETS (2,000 MG TOTAL) BY MOUTH DAILY WITH SUPPER.   NovoLOG FlexPen 100 UNIT/ML FlexPen Generic drug: insulin aspart Inject 20-40 Units into the skin 3 (three) times daily with meals.   OneTouch Ultra test strip Generic drug: glucose blood USE AS INSTRUCTED TO CHECK BLOOD SUGAR THREE TIMES DAILY.   OneTouch Verio w/Device Kit UAD to monitor  glucose tid; Dx: E11.65   potassium chloride 10 MEQ CR capsule Commonly known as: MICRO-K Take 1 capsule (10 mEq total) by mouth every Monday, Wednesday, and Friday. Along with the lasix   pravastatin 40 MG tablet Commonly known as: PRAVACHOL Take 1 tablet (40 mg total) by mouth daily.   Toujeo Max SoloStar 300 UNIT/ML Solostar Pen Generic drug: insulin glargine (2 Unit Dial) Inject 150 Units into the skin daily.   Victoza 18 MG/3ML Sopn Generic drug: liraglutide INJECT 0.3 MLS (1.8 MG TOTAL) INTO THE SKIN DAILY. INJECT ONCE A DAY AT THE SAME TIME   Vitamin D (Ergocalciferol) 1.25 MG (50000 UNIT) Caps capsule Commonly known as: DRISDOL Take 1 capsule (50,000 Units total) by mouth every 7 (seven) days. Saturday  Xarelto 20 MG Tabs tablet Generic drug: rivaroxaban TAKE 1 TABLET (20 MG TOTAL) BY MOUTH DAILY WITH SUPPER.        Allergies:  Allergies  Allergen Reactions   Simvastatin     Other reaction(s): leg weakness Other reaction(s): leg weakness   Sulfa Antibiotics Other (See Comments)    Cant remember Other reaction(s): as a child    Past Medical History:  Diagnosis Date   Abnormal electrocardiogram 08/14/2014   LBBB   Anemia    Anxiety    Arthritis    Atrial fibrillation (HCC)    Bilateral swelling of feet    Chest pain    Constipation    Depression    Diabetes mellitus ([place])    Difficulty walking    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   Qualifier: Diagnosis of  By: Aline Brochure MD, [place]     Dyspnea 08/14/2014   Edema    Gallbladder problem    GERD (gastroesophageal reflux disease)    Hyperlipidemia    Hypertension    LOW BACK PAIN 12/20/2009   Qualifier: Diagnosis of  By: Aline Brochure MD, Stanley     Morbid obesity ([place])    OSA (obstructive sleep apnea) 08/16/2014   Osteoarthritis    Other fatigue    Palpitations    Seasonal allergies    Shortness of breath    Shortness of breath on exertion    Sinusitis    SPINAL STENOSIS 12/20/2009   Qualifier:  Diagnosis of  By: Aline Brochure MD, Stanley     Stage 3 chronic kidney disease [facility])     Past Surgical History:  Procedure Laterality Date   CHOLECYSTECTOMY     COLONOSCOPY WITH PROPOFOL N/A 11/29/2014   Procedure: COLONOSCOPY WITH PROPOFOL;  Surgeon: Arta Silence, MD;  Location: [facility];  Service: Endoscopy;  Laterality: N/A;   SKIN CANCER EXCISION     TONSILLECTOMY      Family History  Problem Relation Age of Onset   Heart disease Father        MI   Cancer - Lung Father        Small cell Lung CA   Diabetes Father    Hypertension Father    Hyperlipidemia Father    Cancer Father    Depression Father    Anxiety disorder Father    Hypertension Mother    Hyperlipidemia Mother    Stroke Mother    Kidney disease Mother    Depression Mother    Anxiety disorder Mother    Bipolar disorder Mother    Eating disorder Mother    Obesity Mother     Social History:  reports that she quit smoking about 27 years ago. Her smoking use included cigarettes. She has a 40.00 pack-year smoking history. She has quit using smokeless tobacco. She reports current alcohol use. She reports that she does not use drugs.    Review of Systems    Lipid history:    Triglycerides have been over 400 at baseline and she is taking fenofibrate in addition to her pravastatin  She is taking pravastatin; she had intolerance to simvastatin before LDL is below 100 Triglycerides are below 200    Lab Results  Component Value Date   CHOL 140 07/15/2021   CHOL 141 09/17/2020   CHOL 134 02/28/2020   Lab Results  Component Value Date   HDL 49.50 07/15/2021   HDL 42.40 09/17/2020   HDL 32.40 (L) 02/28/2020   Lab Results  Component Value Date   LDLCALC  60 07/15/2021   LDLCALC 65 09/17/2020   LDLCALC 66 03/08/2019   Lab Results  Component Value Date   TRIG 153.0 (H) 07/15/2021   TRIG 127 10/23/2020   TRIG 165.0 (H) 09/17/2020   Lab Results  Component Value Date   CHOLHDL 3 07/15/2021   CHOLHDL  3 09/17/2020   CHOLHDL 4 02/28/2020   Lab Results  Component Value Date   LDLDIRECT 72.0 02/28/2020   LDLDIRECT 62.0 07/22/2019   LDLDIRECT 83.0 01/11/2019            .  Most recent eye exam was In 7/21, report not available   Hypertension:  On treatment from PCP; taking atenolol 50 mg and Hytrin 2 mg Also on Invokana   BP Readings from Last 3 Encounters:  07/18/21 128/76  07/01/21 113/72  06/05/21 135/78   Electrolytes: She is  on Lasix  Lab Results  Component Value Date   K 4.9 07/15/2021     NEUROPATHY: Has persistent numbness for in her feet the last several years along with some pain and weakness   She is taking Cymbalta a total of 90 mg a day and 600 mg of gabapentin  Last foot exam in 7/21 done by her PCP, previous findings:   Monofilament sensation is decreased distally on all the toes and distal plantar surfaces   Physical Examination:  BP 128/76   Pulse 83   Ht '5\' 1"'  (1.549 m)   Wt 286 lb (129.7 kg)   SpO2 98%   BMI 54.04 kg/m      Diabetic Foot Exam - Simple   Simple Foot Form Diabetic Foot exam was performed with the following findings: Yes   Visual Inspection No deformities, no ulcerations, no other skin breakdown bilaterally: Yes Sensation Testing See comments: Yes Pulse Check Posterior Tibialis and Dorsalis pulse intact bilaterally: Yes See comments: Yes Comments Absent monofilament sensation in the distal feet. Posterior tibialis pulses not well felt     ASSESSMENT:  Diabetes type 2,  with morbid obesity on insulin  See history of present illness for detailed discussion of  current management, blood sugar patterns and problems identified   She is on basal bolus insulin, Invokana and Victoza  Her A1c is stable at 6.5  She needs relatively large amount of 154 units of basal insulin  Mealtime doses are up to 40 units NovoLog However not clear what her postprandial readings are  She is generally watching her portions and  carbohydrates and likely has good control at various times Blood sugars in the mornings are better Also on Invokana with her side effects  LIPIDS: Well-controlled except mild increase in triglycerides of 153  Neuropathy: Discussed general principles of continue care with her anesthesia and distal feet  No evidence of microalbuminuria  PLAN:   No change in insulin Try to check more readings after meals Explained to her that she will adjust her NovoLog to keep her postprandial readings averaging about 150 or so  If she has better co-pay for Dexcom next year she will try this again Increase activity as tolerated for weight loss No change in gabapentin  Patient Instructions  Check blood sugars on waking up 4 days a week  Also check blood sugars about 2 hours after meals and do this after different meals by rotation  Recommended blood sugar levels on waking up are 90-130 and about 2 hours after meal is 130-180  Please bring your blood sugar monitor to each visit, thank you  Elayne Snare 07/18/2021, 2:18 PM   Note: This office note was prepared with Dragon voice recognition system technology. Any transcriptional errors that result from this process are unintentional.

## 2021-07-22 ENCOUNTER — Ambulatory Visit (INDEPENDENT_AMBULATORY_CARE_PROVIDER_SITE_OTHER): Payer: PPO | Admitting: Family Medicine

## 2021-07-22 ENCOUNTER — Other Ambulatory Visit: Payer: Self-pay

## 2021-07-22 ENCOUNTER — Encounter (INDEPENDENT_AMBULATORY_CARE_PROVIDER_SITE_OTHER): Payer: Self-pay | Admitting: Family Medicine

## 2021-07-22 VITALS — BP 140/78 | HR 85 | Temp 97.9°F | Ht 61.0 in | Wt 280.0 lb

## 2021-07-22 DIAGNOSIS — I10 Essential (primary) hypertension: Secondary | ICD-10-CM

## 2021-07-22 DIAGNOSIS — Z6841 Body Mass Index (BMI) 40.0 and over, adult: Secondary | ICD-10-CM | POA: Diagnosis not present

## 2021-07-22 DIAGNOSIS — E559 Vitamin D deficiency, unspecified: Secondary | ICD-10-CM | POA: Diagnosis not present

## 2021-07-22 MED ORDER — VITAMIN D (ERGOCALCIFEROL) 1.25 MG (50000 UNIT) PO CAPS: 50000.0000 [IU] | ORAL_CAPSULE | ORAL | 0 refills | Status: DC

## 2021-07-23 ENCOUNTER — Other Ambulatory Visit (INDEPENDENT_AMBULATORY_CARE_PROVIDER_SITE_OTHER): Payer: Self-pay | Admitting: Family Medicine

## 2021-07-23 DIAGNOSIS — E559 Vitamin D deficiency, unspecified: Secondary | ICD-10-CM

## 2021-07-23 NOTE — Progress Notes (Signed)
Chief Complaint:   OBESITY Melanie Reeves is here to discuss her progress with her obesity treatment plan along with follow-up of her obesity related diagnoses. Melanie Reeves is on keeping a food journal and adhering to recommended goals of 1300-1600 calories and 85+ grams of protein daily and states she is following her eating plan approximately 50% of the time. Melanie Reeves states she is doing 0 minutes 0 times per week.  Today's visit was #: 10 Starting weight: 294 lbs Starting date: 11/29/2020 Today's weight: 280 lbs Today's date: 07/22/2021 Total lbs lost to date: 14 Total lbs lost since last in-office visit: 0  Interim History: Melanie Reeves has had more challenges with following her plan. She has had increased temptations, but she is working on not buying cookies, etc. She is working on eating slower to help her feel full when she finishes eating.  Subjective:   1. Vitamin D deficiency Melanie Reeves is stable on Vit D, but her level is not yet at goal.  2. Essential hypertension Melanie Reeves's blood pressure is elevated today. She is working on weight loss. She denies chest pain or headache.  Assessment/Plan:   1. Vitamin D deficiency Low Vitamin D level contributes to fatigue and are associated with obesity, breast, and colon cancer. We will refill prescription Vitamin D for 1 month. Melanie Reeves will follow-up for routine testing of Vitamin D, at least 2-3 times per year to avoid over-replacement.  - Vitamin D, Ergocalciferol, (DRISDOL) 1.25 MG (50000 UNIT) CAPS capsule; Take 1 capsule (50,000 Units total) by mouth every 7 (seven) days. Saturday  Dispense: 4 capsule; Refill: 0  2. Essential hypertension [place] continue her diet, weight loss, and medications to improve blood pressure control. We will recheck her blood pressure in 3-4 weeks.  3. Obesity with current BMI 52.9 Melanie Reeves is currently in the action stage of change. As such, her goal is to continue with weight loss efforts. She has agreed to keeping a food  journal and adhering to recommended goals of 1300-1600 calories and 85+ grams of protein daily.   Behavioral modification strategies: increasing lean protein intake, increasing water intake, and decreasing sodium intake.  Melanie Reeves has agreed to follow-up with our clinic in 3 to 4 weeks. She was informed of the importance of frequent follow-up visits to maximize her success with intensive lifestyle modifications for her multiple health conditions.   Objective:   Blood pressure 140/78, pulse 85, temperature 97.9 F (36.6 C), height 5\' 1"  (1.549 m), weight 280 lb (127 kg), SpO2 96 %. Body mass index is 52.91 kg/m.  General: Cooperative, alert, well developed, in no acute distress. HEENT: Conjunctivae and lids unremarkable. Cardiovascular: Regular rhythm.  Lungs: Normal work of breathing. Neurologic: No focal deficits.   Lab Results  Component Value Date   CREATININE 1.02 07/15/2021   BUN 33 (H) 07/15/2021   NA 138 07/15/2021   K 4.9 07/15/2021   CL 101 07/15/2021   CO2 27 07/15/2021   Lab Results  Component Value Date   ALT 20 07/15/2021   AST 20 07/15/2021   ALKPHOS 44 07/15/2021   BILITOT 0.3 07/15/2021   Lab Results  Component Value Date   HGBA1C 6.5 07/15/2021   HGBA1C 6.5 03/19/2021   HGBA1C 6.8 (H) 12/18/2020   HGBA1C 8.2 (H) 11/24/2020   HGBA1C 7.7 (H) 09/17/2020   No results found for: INSULIN Lab Results  Component Value Date   TSH 1.217 11/24/2020   Lab Results  Component Value Date   CHOL 140 07/15/2021  HDL 49.50 07/15/2021   LDLCALC 60 07/15/2021   LDLDIRECT 72.0 02/28/2020   TRIG 153.0 (H) 07/15/2021   CHOLHDL 3 07/15/2021   Lab Results  Component Value Date   VD25OH 36.2 06/05/2021   VD25OH 19.7 (L) 11/29/2020   Lab Results  Component Value Date   WBC 12.8 (H) 04/04/2021   HGB 10.6 (L) 04/04/2021   HCT 35.8 (L) 04/04/2021   MCV 90.6 04/04/2021   PLT 319 04/04/2021   Lab Results  Component Value Date   FERRITIN 264 10/25/2020     Obesity Behavioral Intervention:   Approximately 15 minutes were spent on the discussion below.  ASK: We discussed the diagnosis of obesity with Melanie Reeves today and Melanie Reeves agreed to give [place] permission to discuss obesity behavioral modification therapy today.  ASSESS: Melanie Reeves has the diagnosis of obesity and her BMI today is 52.9. Melanie Reeves is in the action stage of change.   ADVISE: Melanie Reeves was educated on the multiple health risks of obesity as well as the benefit of weight loss to improve her health. She was advised of the need for long term treatment and the importance of lifestyle modifications to improve her current health and to decrease her risk of future health problems.  AGREE: Multiple dietary modification options and treatment options were discussed and Melanie Reeves agreed to follow the recommendations documented in the above note.  ARRANGE: Melanie Reeves was educated on the importance of frequent visits to treat obesity as outlined per CMS and USPSTF guidelines and agreed to schedule her next follow up appointment today.  Attestation Statements:   Reviewed by clinician on day of visit: allergies, medications, problem list, medical history, surgical history, family history, social history, and previous encounter notes.   I, Burt Knack, am acting as transcriptionist for Quillian Quince, MD.  I have reviewed the above documentation for accuracy and completeness, and I agree with the above. -  Quillian Quince, MD

## 2021-07-24 DIAGNOSIS — R059 Cough, unspecified: Secondary | ICD-10-CM | POA: Diagnosis not present

## 2021-07-24 DIAGNOSIS — Z03818 Encounter for observation for suspected exposure to other biological agents ruled out: Secondary | ICD-10-CM | POA: Diagnosis not present

## 2021-07-24 DIAGNOSIS — H1031 Unspecified acute conjunctivitis, right eye: Secondary | ICD-10-CM | POA: Diagnosis not present

## 2021-07-24 DIAGNOSIS — J029 Acute pharyngitis, unspecified: Secondary | ICD-10-CM | POA: Diagnosis not present

## 2021-07-24 DIAGNOSIS — R0981 Nasal congestion: Secondary | ICD-10-CM | POA: Diagnosis not present

## 2021-07-29 DIAGNOSIS — I4891 Unspecified atrial fibrillation: Secondary | ICD-10-CM | POA: Diagnosis not present

## 2021-07-29 DIAGNOSIS — E1136 Type 2 diabetes mellitus with diabetic cataract: Secondary | ICD-10-CM | POA: Diagnosis not present

## 2021-07-29 DIAGNOSIS — N1831 Chronic kidney disease, stage 3a: Secondary | ICD-10-CM | POA: Diagnosis not present

## 2021-07-29 DIAGNOSIS — E1122 Type 2 diabetes mellitus with diabetic chronic kidney disease: Secondary | ICD-10-CM | POA: Diagnosis not present

## 2021-07-29 DIAGNOSIS — I10 Essential (primary) hypertension: Secondary | ICD-10-CM | POA: Diagnosis not present

## 2021-07-29 DIAGNOSIS — E1142 Type 2 diabetes mellitus with diabetic polyneuropathy: Secondary | ICD-10-CM | POA: Diagnosis not present

## 2021-07-29 DIAGNOSIS — E785 Hyperlipidemia, unspecified: Secondary | ICD-10-CM | POA: Diagnosis not present

## 2021-07-29 DIAGNOSIS — N179 Acute kidney failure, unspecified: Secondary | ICD-10-CM | POA: Diagnosis not present

## 2021-07-29 DIAGNOSIS — F321 Major depressive disorder, single episode, moderate: Secondary | ICD-10-CM | POA: Diagnosis not present

## 2021-07-29 DIAGNOSIS — M8580 Other specified disorders of bone density and structure, unspecified site: Secondary | ICD-10-CM | POA: Diagnosis not present

## 2021-07-31 ENCOUNTER — Other Ambulatory Visit: Payer: Self-pay | Admitting: Endocrinology

## 2021-08-05 DIAGNOSIS — E1165 Type 2 diabetes mellitus with hyperglycemia: Secondary | ICD-10-CM | POA: Diagnosis not present

## 2021-08-05 DIAGNOSIS — H2513 Age-related nuclear cataract, bilateral: Secondary | ICD-10-CM | POA: Diagnosis not present

## 2021-08-05 DIAGNOSIS — H40023 Open angle with borderline findings, high risk, bilateral: Secondary | ICD-10-CM | POA: Diagnosis not present

## 2021-08-05 DIAGNOSIS — H31013 Macula scars of posterior pole (postinflammatory) (post-traumatic), bilateral: Secondary | ICD-10-CM | POA: Diagnosis not present

## 2021-08-05 DIAGNOSIS — Z83511 Family history of glaucoma: Secondary | ICD-10-CM | POA: Diagnosis not present

## 2021-08-14 ENCOUNTER — Encounter (INDEPENDENT_AMBULATORY_CARE_PROVIDER_SITE_OTHER): Payer: Self-pay | Admitting: Family Medicine

## 2021-08-14 ENCOUNTER — Other Ambulatory Visit: Payer: Self-pay

## 2021-08-14 ENCOUNTER — Other Ambulatory Visit (INDEPENDENT_AMBULATORY_CARE_PROVIDER_SITE_OTHER): Payer: Self-pay | Admitting: Family Medicine

## 2021-08-14 ENCOUNTER — Ambulatory Visit (INDEPENDENT_AMBULATORY_CARE_PROVIDER_SITE_OTHER): Payer: PPO | Admitting: Family Medicine

## 2021-08-14 VITALS — BP 126/66 | HR 86 | Temp 97.9°F | Ht 61.0 in | Wt 275.0 lb

## 2021-08-14 DIAGNOSIS — Z6841 Body Mass Index (BMI) 40.0 and over, adult: Secondary | ICD-10-CM

## 2021-08-14 DIAGNOSIS — Z794 Long term (current) use of insulin: Secondary | ICD-10-CM

## 2021-08-14 DIAGNOSIS — E1169 Type 2 diabetes mellitus with other specified complication: Secondary | ICD-10-CM

## 2021-08-14 DIAGNOSIS — E559 Vitamin D deficiency, unspecified: Secondary | ICD-10-CM

## 2021-08-14 MED ORDER — TIRZEPATIDE 10 MG/0.5ML ~~LOC~~ SOAJ: 10.0000 mg | SUBCUTANEOUS | 0 refills | Status: DC

## 2021-08-15 NOTE — Progress Notes (Signed)
Chief Complaint:   OBESITY Melanie Reeves is here to discuss her progress with her obesity treatment plan along with follow-up of her obesity related diagnoses. Melanie Reeves is on keeping a food journal and adhering to recommended goals of 1300-1600 calories and 85+ grams of protein daily and states she is following her eating plan approximately 45% of the time. Melanie Reeves states she is doing 0 minutes 0 times per week.  Today's visit was #: 11 Starting weight: 294 lbs Starting date: 11/29/2020 Today's weight: 275 lbs Today's date: 08/14/2021 Total lbs lost to date: 19 Total lbs lost since last in-office visit: 5  Interim History: Melanie Reeves continues to do well with weight loss. She has increased her lean protein and vegetables. She is working on portion controlling her snacks, and she is open to discussing holiday eating strategies.  Subjective:   1. Type 2 diabetes mellitus with other specified complication, with long-term current use of insulin (HCC) [name] is stable on Victoza, and she denies signs of hypoglycemia but she is still struggling with polyphagia.  Assessment/Plan:   1. Type 2 diabetes mellitus with other specified complication, with long-term current use of insulin (HCC) [name] agreed to start Mounjaro 10 mg q weekly with no refills. She will continue Victoza for now while we try to get coverage for [facility]. Good blood sugar control is important to decrease the likelihood of diabetic complications such as nephropathy, neuropathy, limb loss, blindness, coronary artery disease, and death. Intensive lifestyle modification including diet, exercise and weight loss are the first line of treatment for diabetes.   - tirzepatide (MOUNJARO) 10 MG/0.5ML Pen; Inject 10 mg into the skin once a week.  Dispense: 6 mL; Refill: 0  2. Obesity with current BMI of 57 Melanie Reeves is currently in the action stage of change. As such, her goal is to continue with weight loss efforts. She has agreed to keeping a food  journal and adhering to recommended goals of 1300-1600 calories and 85+ grams of protein daily.   Behavioral modification strategies: holiday eating strategies .  Melanie Reeves has agreed to follow-up with our clinic in 4 weeks. She was informed of the importance of frequent follow-up visits to maximize her success with intensive lifestyle modifications for her multiple health conditions.   Objective:   Blood pressure 126/66, pulse 86, temperature 97.9 F (36.6 C), height 5\' 1"  (1.549 m), weight 275 lb (124.7 kg), SpO2 96 %. Body mass index is 51.96 kg/m.  General: Cooperative, alert, well developed, in no acute distress. HEENT: Conjunctivae and lids unremarkable. Cardiovascular: Regular rhythm.  Lungs: Normal work of breathing. Neurologic: No focal deficits.   Lab Results  Component Value Date   CREATININE 1.02 07/15/2021   BUN 33 (H) 07/15/2021   NA 138 07/15/2021   K 4.9 07/15/2021   CL 101 07/15/2021   CO2 27 07/15/2021   Lab Results  Component Value Date   ALT 20 07/15/2021   AST 20 07/15/2021   ALKPHOS 44 07/15/2021   BILITOT 0.3 07/15/2021   Lab Results  Component Value Date   HGBA1C 6.5 07/15/2021   HGBA1C 6.5 03/19/2021   HGBA1C 6.8 (H) 12/18/2020   HGBA1C 8.2 (H) 11/24/2020   HGBA1C 7.7 (H) 09/17/2020   No results found for: INSULIN Lab Results  Component Value Date   TSH 1.217 11/24/2020   Lab Results  Component Value Date   CHOL 140 07/15/2021   HDL 49.50 07/15/2021   LDLCALC 60 07/15/2021   LDLDIRECT 72.0 02/28/2020  TRIG 153.0 (H) 07/15/2021   CHOLHDL 3 07/15/2021   Lab Results  Component Value Date   VD25OH 36.2 06/05/2021   VD25OH 19.7 (L) 11/29/2020   Lab Results  Component Value Date   WBC 12.8 (H) 04/04/2021   HGB 10.6 (L) 04/04/2021   HCT 35.8 (L) 04/04/2021   MCV 90.6 04/04/2021   PLT 319 04/04/2021   Lab Results  Component Value Date   FERRITIN 264 10/25/2020   Attestation Statements:   Reviewed by clinician on day of visit:  allergies, medications, problem list, medical history, surgical history, family history, social history, and previous encounter notes.  Time spent on visit including pre-visit chart review and post-visit care and charting was 40 minutes.    I, Trixie Dredge, am acting as transcriptionist for Dennard Nip, MD.  I have reviewed the above documentation for accuracy and completeness, and I agree with the above. -  Dennard Nip, MD

## 2021-08-21 DIAGNOSIS — I4891 Unspecified atrial fibrillation: Secondary | ICD-10-CM | POA: Diagnosis not present

## 2021-08-21 DIAGNOSIS — N1831 Chronic kidney disease, stage 3a: Secondary | ICD-10-CM | POA: Diagnosis not present

## 2021-08-21 DIAGNOSIS — E785 Hyperlipidemia, unspecified: Secondary | ICD-10-CM | POA: Diagnosis not present

## 2021-08-21 DIAGNOSIS — F321 Major depressive disorder, single episode, moderate: Secondary | ICD-10-CM | POA: Diagnosis not present

## 2021-08-21 DIAGNOSIS — I10 Essential (primary) hypertension: Secondary | ICD-10-CM | POA: Diagnosis not present

## 2021-08-21 DIAGNOSIS — E1142 Type 2 diabetes mellitus with diabetic polyneuropathy: Secondary | ICD-10-CM | POA: Diagnosis not present

## 2021-08-21 DIAGNOSIS — M8580 Other specified disorders of bone density and structure, unspecified site: Secondary | ICD-10-CM | POA: Diagnosis not present

## 2021-08-21 DIAGNOSIS — E1136 Type 2 diabetes mellitus with diabetic cataract: Secondary | ICD-10-CM | POA: Diagnosis not present

## 2021-08-21 DIAGNOSIS — E1122 Type 2 diabetes mellitus with diabetic chronic kidney disease: Secondary | ICD-10-CM | POA: Diagnosis not present

## 2021-08-22 ENCOUNTER — Other Ambulatory Visit: Payer: Self-pay | Admitting: Endocrinology

## 2021-08-22 NOTE — Progress Notes (Signed)
Cardiology Office Note   Date:  08/25/2021   ID:  Melanie Reeves, DOB 22-Jun-1951, MRN [id number]  PCP:  Vernie Shanks, MD  Cardiologist:   Dorris Carnes, MD    F/U for atrial fbrillation   History of Present Illness: Melanie Reeves is a 70 y.o. female with a history of HTN, diastolic CHF,  PAF, DMII, HL, OSA, LBBB, morbid obesity   Myovue in 2018 was normal  Echo in Feb 2022 done showing normal LVEF and mild diastolic dysfunction I saw the pt in June 2022  Since seen she is doing OK   Breathing is stable   She is walking now    Does get SOB with walking but no CP   No dizziness  Current Meds  Medication Sig   atenolol (TENORMIN) 25 MG tablet Take 1 tablet (25 mg total) by mouth daily.   B-D ULTRAFINE III SHORT PEN 31G X 8 MM MISC USE TO INJECT MEDICATION 5 TIMES DAILY.   benzonatate (TESSALON) 100 MG capsule 1-2 capsule   Blood Glucose Monitoring Suppl (ONETOUCH VERIO) w/Device KIT UAD to monitor glucose tid; Dx: E11.65   cyclobenzaprine (FLEXERIL) 5 MG tablet Take 5 mg by mouth 3 (three) times daily as needed for muscle spasms.   diphenhydrAMINE (BENADRYL) 25 mg capsule Take 50 mg by mouth at bedtime as needed for sleep.   DULoxetine (CYMBALTA) 30 MG capsule TAKE 1 CAPSULE BY MOUTH DAILY. TAKE IN ADDITION TO THE 60 MG   DULoxetine (CYMBALTA) 60 MG capsule Take 60 mg by mouth daily. Taking in the AM   fenofibrate (TRICOR) 145 MG tablet TAKE 1 TABLET BY MOUTH EVERY DAY   furosemide (LASIX) 40 MG tablet Take 1 tablet (40 mg total) by mouth every Monday, Wednesday, and Friday.   gabapentin (NEURONTIN) 600 MG tablet TAKE 1 TABLET BY MOUTH 3 TIMES A DAY AND 1 TABLET AT BEDTIME   glucose blood (ONETOUCH ULTRA) test strip USE AS INSTRUCTED TO CHECK BLOOD SUGAR THREE TIMES DAILY.   HYDROcodone-acetaminophen (NORCO/VICODIN) 5-325 MG tablet Take 1 tablet by mouth every 6 (six) hours as needed for moderate pain.   ibuprofen (ADVIL) 200 MG tablet Take 400 mg by mouth every 6 (six) hours as  needed for fever.   insulin aspart (NOVOLOG FLEXPEN) 100 UNIT/ML FlexPen Inject 20-40 Units into the skin 3 (three) times daily with meals.   insulin glargine, 2 Unit Dial, (TOUJEO MAX SOLOSTAR) 300 UNIT/ML Solostar Pen Inject 150 Units into the skin daily.   INVOKANA 300 MG TABS tablet TAKE 1 TABLET (300 MG TOTAL) BY MOUTH DAILY BEFORE BREAKFAST.   loratadine (CLARITIN) 10 MG tablet Take 10 mg by mouth daily.   metFORMIN (GLUCOPHAGE-XR) 500 MG 24 hr tablet TAKE 4 TABLETS (2,000 MG TOTAL) BY MOUTH DAILY WITH SUPPER.   Omega-3 Fatty Acids (FISH OIL) 1000 MG CAPS Take 2,000 mg by mouth daily.   potassium chloride (MICRO-K) 10 MEQ CR capsule Take 1 capsule (10 mEq total) by mouth every Monday, Wednesday, and Friday. Along with the lasix   pravastatin (PRAVACHOL) 40 MG tablet TAKE 1 TABLET BY MOUTH EVERY DAY   tirzepatide (MOUNJARO) 10 MG/0.5ML Pen Inject 10 mg into the skin once a week.   VICTOZA 18 MG/3ML SOPN INJECT 0.3 MLS (1.8 MG TOTAL) INTO THE SKIN DAILY. INJECT ONCE A DAY AT THE SAME TIME   Vitamin D, Ergocalciferol, (DRISDOL) 1.25 MG (50000 UNIT) CAPS capsule Take 1 capsule (50,000 Units total) by mouth every 7 (seven)  days. Saturday   XARELTO 20 MG TABS tablet TAKE 1 TABLET (20 MG TOTAL) BY MOUTH DAILY WITH SUPPER.     Allergies:   Simvastatin and Sulfa antibiotics   Past Medical History:  Diagnosis Date   Abnormal electrocardiogram 08/14/2014   LBBB   Anemia    Anxiety    Arthritis    Atrial fibrillation (HCC)    Bilateral swelling of feet    Chest pain    Constipation    Depression    Diabetes mellitus ([place])    Difficulty walking    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   Qualifier: Diagnosis of  By: Aline Brochure MD, Stanley     Dyspnea 08/14/2014   Edema    Gallbladder problem    GERD (gastroesophageal reflux disease)    Hyperlipidemia    Hypertension    LOW BACK PAIN 12/20/2009   Qualifier: Diagnosis of  By: Aline Brochure MD, Stanley     Morbid obesity ([place])    OSA (obstructive sleep  apnea) 08/16/2014   Osteoarthritis    Other fatigue    Palpitations    Seasonal allergies    Shortness of breath    Shortness of breath on exertion    Sinusitis    SPINAL STENOSIS 12/20/2009   Qualifier: Diagnosis of  By: Aline Brochure MD, Stanley     Stage 3 chronic kidney disease [facility])     Past Surgical History:  Procedure Laterality Date   CHOLECYSTECTOMY     COLONOSCOPY WITH PROPOFOL N/A 11/29/2014   Procedure: COLONOSCOPY WITH PROPOFOL;  Surgeon: Arta Silence, MD;  Location: [facility];  Service: Endoscopy;  Laterality: N/A;   SKIN CANCER EXCISION     TONSILLECTOMY       Social History:  The patient  reports that she quit smoking about 27 years ago. Her smoking use included cigarettes. She has a 40.00 pack-year smoking history. She has quit using smokeless tobacco. She reports current alcohol use. She reports that she does not use drugs.   Family History:  The patient's family history includes Anxiety disorder in her father and mother; Bipolar disorder in her mother; Cancer in her father; Cancer - Lung in her father; Depression in her father and mother; Diabetes in her father; Eating disorder in her mother; Heart disease in her father; Hyperlipidemia in her father and mother; Hypertension in her father and mother; Kidney disease in her mother; Obesity in her mother; Stroke in her mother.    ROS:  Please see the history of present illness. All other systems are reviewed and  Negative to the above problem except as noted.    PHYSICAL EXAM: VS:  BP 130/70   Pulse 87   Ht $R'5\' 1"'ic$  (1.549 m)   Wt 288 lb 6.4 oz (130.8 kg)   SpO2 96%   BMI 54.49 kg/m   GEN:  Morbidly obese 70 yo , in no acute distress  HEENT: normal  Neck: Neck is full Cardiac:RRR S1, S2   No murmurs  Tr LE   edema Legs soft   Respiratory:  clear to auscultation bilaterally, GI: soft, nontender, nondistended, + BS  No hepatomegaly  MS: no deformity Moving all extremities   Skin: warm and dry, no rash Neuro:   Strength and sensation are intact    EKG:  EKG is not ordered today.   11/2020 Echo  1. Left ventricular ejection fraction, by estimation, is 55 to 60%. The left ventricle has normal function. The left ventricle has no regional wall motion abnormalities. There  is moderate concentric left ventricular hypertrophy. Left ventricular diastolic parameters are consistent with Grade I diastolic dysfunction (impaired relaxation). 2. Right ventricular systolic function is normal. The right ventricular size is normal. There is normal pulmonary artery systolic pressure. 3. The mitral valve is normal in structure. Mild mitral valve regurgitation. No evidence of mitral stenosis. 4. The aortic valve is tricuspid. Aortic valve regurgitation is not visualized. No aortic stenosis is present. Aortic valve mean gradient measures 9.0 mmHg. 5. The inferior vena cava is normal in size with greater than 50% respiratory variability, suggesting right atrial pressure of 3 mmHg. Comparison(s): A prior study was performed on 09/20/2017. No significant change from prior study. Prior images reviewed side by side.  Lipid Panel    Component Value Date/Time   CHOL 140 07/15/2021 1054   TRIG 153.0 (H) 07/15/2021 1054   HDL 49.50 07/15/2021 1054   CHOLHDL 3 07/15/2021 1054   VLDL 30.6 07/15/2021 1054   LDLCALC 60 07/15/2021 1054   LDLDIRECT 72.0 02/28/2020 1124      Wt Readings from Last 3 Encounters:  08/23/21 288 lb 6.4 oz (130.8 kg)  08/14/21 275 lb (124.7 kg)  07/22/21 280 lb (127 kg)      ASSESSMENT AND PLAN: 1   Atrial fib PAF   Found on event monitor Had some when hosp with COVID  Denies symptoms  Pt's CHADS2VASc is 3 / 4     Keep on anticoagulation.       Regular rhythm today    2   Chronic diastolic CHF   Will get BMET and BNP today    Keep on same meds    3  LBBB  No ischemia on myovue in 2018  4   DM   Last A1C 6.5      5.   LIpids in Oct LDL 60  HDL 50 Trig 153     Plan for f/u in 6  months  Current medicines are reviewed at length with the patient today.  The patient does not have concerns regarding medicines.  Signed, Dorris Carnes, MD  08/25/2021 10:49 PM    [address], [place], [place]  [address] Phone: [phone]; Fax: [phone]

## 2021-08-23 ENCOUNTER — Other Ambulatory Visit: Payer: Self-pay

## 2021-08-23 ENCOUNTER — Encounter: Payer: Self-pay | Admitting: Internal Medicine

## 2021-08-23 ENCOUNTER — Ambulatory Visit (INDEPENDENT_AMBULATORY_CARE_PROVIDER_SITE_OTHER): Payer: PPO | Admitting: Internal Medicine

## 2021-08-23 VITALS — BP 130/70 | HR 87 | Ht 61.0 in | Wt 288.4 lb

## 2021-08-23 DIAGNOSIS — I5032 Chronic diastolic (congestive) heart failure: Secondary | ICD-10-CM | POA: Diagnosis not present

## 2021-08-23 NOTE — Patient Instructions (Addendum)
  Medication Instructions:   Your physician recommends that you continue on your current medications as directed. Please refer to the Current Medication list given to you today.   *If you need a refill on your cardiac medications before your next appointment, please call your pharmacy*   Lab Work:  BMET  CBC AND BNP  TODAY    If you have labs (blood work) drawn today and your tests are completely normal, you will receive your results only by: MyChart Message (if you have MyChart) OR A paper copy in the mail If you have any lab test that is abnormal or we need to change your treatment, we will call you to review the results.   Testing/Procedures: NONE ORDERED  TODAY    Follow-Up: At [facility], you and your health needs are our priority.  As part of our continuing mission to provide you with exceptional heart care, we have created designated Provider Care Teams.  These Care Teams include your primary Cardiologist (physician) and Advanced Practice Providers (APPs -  Physician Assistants and Nurse Practitioners) who all work together to provide you with the care you need, when you need it.  We recommend signing up for the patient portal called "MyChart".  Sign up information is provided on this After Visit Summary.  MyChart is used to connect with patients for Virtual Visits (Telemedicine).  Patients are able to view lab/test results, encounter notes, upcoming appointments, etc.  Non-urgent messages can be sent to your provider as well.   To learn more about what you can do with MyChart, go to [email or web address].    Your next appointment:   6 month(s)  The format for your next appointment:   In Person  Provider:   Dietrich Pates, MD     Other Instructions

## 2021-08-24 LAB — CBC
Hematocrit: 41.4 % (ref 34.0–46.6)
Hemoglobin: 13.1 g/dL (ref 11.1–15.9)
MCH: 27.2 pg (ref 26.6–33.0)
MCHC: 31.6 g/dL (ref 31.5–35.7)
MCV: 86 fL (ref 79–97)
Platelets: 295 10*3/uL (ref 150–450)
RBC: 4.82 x10E6/uL (ref 3.77–5.28)
RDW: 14.7 % (ref 11.7–15.4)
WBC: 13.9 10*3/uL — ABNORMAL HIGH (ref 3.4–10.8)

## 2021-08-24 LAB — BASIC METABOLIC PANEL
BUN/Creatinine Ratio: 36 — ABNORMAL HIGH (ref 12–28)
BUN: 32 mg/dL — ABNORMAL HIGH (ref 8–27)
CO2: 22 mmol/L (ref 20–29)
Calcium: 9.9 mg/dL (ref 8.7–10.3)
Chloride: 104 mmol/L (ref 96–106)
Creatinine, Ser: 0.9 mg/dL (ref 0.57–1.00)
Glucose: 76 mg/dL (ref 70–99)
Potassium: 4.8 mmol/L (ref 3.5–5.2)
Sodium: 144 mmol/L (ref 134–144)
eGFR: 70 mL/min/{1.73_m2} (ref >59)

## 2021-08-24 LAB — PRO B NATRIURETIC PEPTIDE: NT-Pro BNP: 51 pg/mL (ref 0–301)

## 2021-08-28 ENCOUNTER — Other Ambulatory Visit (INDEPENDENT_AMBULATORY_CARE_PROVIDER_SITE_OTHER): Payer: Self-pay | Admitting: Family Medicine

## 2021-08-28 DIAGNOSIS — E559 Vitamin D deficiency, unspecified: Secondary | ICD-10-CM

## 2021-08-28 NOTE — Telephone Encounter (Signed)
LAST APPOINTMENT DATE: 08/14/21 NEXT APPOINTMENT DATE: 09/09/21   [id number] - [place], [place] - [address] [place] [address] Phone: [phone] Fax: [phone]  [facility] [place] (New Address) - [place], [place] - [address] Previously: [address], [place] [address] [place] [address] Phone: [phone] Fax: [phone]  Patient is requesting a refill of the following medications: Requested Prescriptions   Pending Prescriptions Disp Refills   Vitamin D, Ergocalciferol, (DRISDOL) 1.25 MG (50000 UNIT) CAPS capsule [Pharmacy Med Name: VITAMIN D2 1.25MG (50,000 UNIT)] 4 capsule 0    Sig: TAKE 1 CAPSULE (50,000 UNITS TOTAL) BY MOUTH EVERY 7 (SEVEN) DAYS. SATURDAY    Date last filled: 07/22/21 Previously prescribed by Dr. Dalbert Garnet  Lab Results  Component Value Date   HGBA1C 6.5 07/15/2021   HGBA1C 6.5 03/19/2021   HGBA1C 6.8 (H) 12/18/2020   Lab Results  Component Value Date   MICROALBUR <0.7 07/15/2021   LDLCALC 60 07/15/2021   CREATININE 0.90 08/23/2021   Lab Results  Component Value Date   VD25OH 36.2 06/05/2021   VD25OH 19.7 (L) 11/29/2020    BP Readings from Last 3 Encounters:  08/23/21 130/70  08/14/21 126/66  07/22/21 140/78

## 2021-09-09 ENCOUNTER — Ambulatory Visit (INDEPENDENT_AMBULATORY_CARE_PROVIDER_SITE_OTHER): Payer: PPO | Admitting: Family Medicine

## 2021-09-09 ENCOUNTER — Other Ambulatory Visit: Payer: Self-pay

## 2021-09-09 ENCOUNTER — Encounter (INDEPENDENT_AMBULATORY_CARE_PROVIDER_SITE_OTHER): Payer: Self-pay | Admitting: Family Medicine

## 2021-09-09 VITALS — BP 124/77 | HR 85 | Temp 98.2°F | Ht 61.0 in | Wt 284.0 lb

## 2021-09-09 DIAGNOSIS — E559 Vitamin D deficiency, unspecified: Secondary | ICD-10-CM | POA: Diagnosis not present

## 2021-09-09 DIAGNOSIS — E1169 Type 2 diabetes mellitus with other specified complication: Secondary | ICD-10-CM | POA: Diagnosis not present

## 2021-09-09 DIAGNOSIS — Z6841 Body Mass Index (BMI) 40.0 and over, adult: Secondary | ICD-10-CM

## 2021-09-09 DIAGNOSIS — I509 Heart failure, unspecified: Secondary | ICD-10-CM | POA: Diagnosis not present

## 2021-09-09 DIAGNOSIS — Z794 Long term (current) use of insulin: Secondary | ICD-10-CM | POA: Diagnosis not present

## 2021-09-09 MED ORDER — TIRZEPATIDE 10 MG/0.5ML ~~LOC~~ SOAJ: 10.0000 mg | SUBCUTANEOUS | 0 refills | Status: DC

## 2021-09-09 MED ORDER — VITAMIN D (ERGOCALCIFEROL) 1.25 MG (50000 UNIT) PO CAPS: 50000.0000 [IU] | ORAL_CAPSULE | ORAL | 0 refills | Status: DC

## 2021-09-09 NOTE — Progress Notes (Signed)
Chief Complaint:   OBESITY Melanie Reeves is here to discuss her progress with her obesity treatment plan along with follow-up of her obesity related diagnoses. Melanie Reeves is on keeping a food journal and adhering to recommended goals of 1300-1600 calories and 85+ grams of protein daily and states she is following her eating plan approximately 40-60% of the time. Melanie Reeves states she is doing 0 minutes 0 times per week.  Today's visit was #: 12 Starting weight: 294 lbs Starting date: 11/29/2020 Today's weight: 284 lbs Today's date: 09/09/2021 Total lbs lost to date: 10 Total lbs lost since last in-office visit: 0  Interim History: Melanie Reeves is up 9 lbs in the last 2 weeks, and it appears to be mostly  fluid. She is not journaling but she is trying to be mindful. She has eaten higher calorie and sugar foods since her last visit.  Subjective:   1. Other congestive heart failure ([place]) Melanie Reeves weight is up 9 lbs. She is retaining a lot of fluid in her legs with 4+ pitty edema. She has increased sodium in her diet recently.  2. Type 2 diabetes mellitus with other specified complication, with long-term current use of insulin ([place]) Melanie Reeves is on Victoza but she still notes polyphagia. We had tried to change to [facility], but the prior authorization is still pending.  3. Vitamin D deficiency Melanie Reeves is stable on Vit D, and she denies nausea, vomiting, or muscle weakness.  Assessment/Plan:   1. Other congestive heart failure ([place]) Melanie Reeves agreed to increase Lasix to 40 mg everyday and increase KCI to 10 meq once daily, and she is to decrease sodium in her diet. We will recheck her weight and labs in 2 weeks.  2. Type 2 diabetes mellitus with other specified complication, with long-term current use of insulin ([place]) Melanie Reeves agreed to start Mounjaro 10 mg q weekly with no refills, and discontinue Victoza at that time. Good blood sugar control is important to decrease the likelihood of diabetic complications such as  nephropathy, neuropathy, limb loss, blindness, coronary artery disease, and death. Intensive lifestyle modification including diet, exercise and weight loss are the first line of treatment for diabetes.   - tirzepatide (MOUNJARO) 10 MG/0.5ML Pen; Inject 10 mg into the skin once a week.  Dispense: 2 mL; Refill: 0  3. Vitamin D deficiency Low Vitamin D level contributes to fatigue and are associated with obesity, breast, and colon cancer. We will refill prescription Vitamin D 50,000 IU every week for 1 month. Melanie Reeves will follow-up for routine testing of Vitamin D, at least 2-3 times per year to avoid over-replacement.  - Vitamin D, Ergocalciferol, (DRISDOL) 1.25 MG (50000 UNIT) CAPS capsule; Take 1 capsule (50,000 Units total) by mouth every 7 (seven) days. Saturday  Dispense: 4 capsule; Refill: 0  4. Obesity BMI today is 34 Melanie Reeves is currently in the action stage of change. As such, her goal is to continue with weight loss efforts. She has agreed to keeping a food journal and adhering to recommended goals of 1300-1600 calories and 85+ grams of protein daily.   Behavioral modification strategies: increasing lean protein intake, decreasing simple carbohydrates, increasing vegetables, increasing water intake, decreasing sodium intake, and holiday eating strategies .  Melanie Reeves has agreed to follow-up with our clinic in 2 weeks. She was informed of the importance of frequent follow-up visits to maximize her success with intensive lifestyle modifications for her multiple health conditions.   Objective:   Blood pressure 124/77, pulse 85, temperature 98.2 F (  36.8 C), height 5\' 1"  (1.549 m), weight 284 lb (128.8 kg), SpO2 95 %. Body mass index is 53.66 kg/m.  General: Cooperative, alert, well developed, in no acute distress. HEENT: Conjunctivae and lids unremarkable. Cardiovascular: Regular rhythm.  Lungs: Normal work of breathing. Neurologic: No focal deficits.   Lab Results  Component Value Date    CREATININE 0.90 08/23/2021   BUN 32 (H) 08/23/2021   NA 144 08/23/2021   K 4.8 08/23/2021   CL 104 08/23/2021   CO2 22 08/23/2021   Lab Results  Component Value Date   ALT 20 07/15/2021   AST 20 07/15/2021   ALKPHOS 44 07/15/2021   BILITOT 0.3 07/15/2021   Lab Results  Component Value Date   HGBA1C 6.5 07/15/2021   HGBA1C 6.5 03/19/2021   HGBA1C 6.8 (H) 12/18/2020   HGBA1C 8.2 (H) 11/24/2020   HGBA1C 7.7 (H) 09/17/2020   No results found for: INSULIN Lab Results  Component Value Date   TSH 1.217 11/24/2020   Lab Results  Component Value Date   CHOL 140 07/15/2021   HDL 49.50 07/15/2021   LDLCALC 60 07/15/2021   LDLDIRECT 72.0 02/28/2020   TRIG 153.0 (H) 07/15/2021   CHOLHDL 3 07/15/2021   Lab Results  Component Value Date   VD25OH 36.2 06/05/2021   VD25OH 19.7 (L) 11/29/2020   Lab Results  Component Value Date   WBC 13.9 (H) 08/23/2021   HGB 13.1 08/23/2021   HCT 41.4 08/23/2021   MCV 86 08/23/2021   PLT 295 08/23/2021   Lab Results  Component Value Date   FERRITIN 264 10/25/2020   Attestation Statements:   Reviewed by clinician on day of visit: allergies, medications, problem list, medical history, surgical history, family history, social history, and previous encounter notes.   I, 10/27/2020, am acting as transcriptionist for Burt Knack, MD.  I have reviewed the above documentation for accuracy and completeness, and I agree with the above. -  Quillian Quince, MD

## 2021-09-10 ENCOUNTER — Encounter (INDEPENDENT_AMBULATORY_CARE_PROVIDER_SITE_OTHER): Payer: Self-pay | Admitting: Family Medicine

## 2021-09-10 ENCOUNTER — Telehealth (INDEPENDENT_AMBULATORY_CARE_PROVIDER_SITE_OTHER): Payer: Self-pay | Admitting: Family Medicine

## 2021-09-10 ENCOUNTER — Encounter (INDEPENDENT_AMBULATORY_CARE_PROVIDER_SITE_OTHER): Payer: Self-pay

## 2021-09-10 NOTE — Telephone Encounter (Signed)
Prior authorization approved for Melanie Reeves. Patient sent mychart message.

## 2021-09-14 ENCOUNTER — Other Ambulatory Visit: Payer: Self-pay | Admitting: Internal Medicine

## 2021-09-14 DIAGNOSIS — S81812A Laceration without foreign body, left lower leg, initial encounter: Secondary | ICD-10-CM | POA: Diagnosis not present

## 2021-09-14 DIAGNOSIS — S81802A Unspecified open wound, left lower leg, initial encounter: Secondary | ICD-10-CM | POA: Diagnosis not present

## 2021-09-16 NOTE — Telephone Encounter (Signed)
Prescription refill request for Xarelto received.  Indication: afib  Last office visit: Tenny Craw,  08/23/2021 Weight: 128.8 kg  Age: 70 yo  Scr: 1.02, 07/15/2021 CrCl: 107 ml/min   Refill sent.

## 2021-09-19 ENCOUNTER — Other Ambulatory Visit: Payer: Self-pay | Admitting: Endocrinology

## 2021-09-19 ENCOUNTER — Other Ambulatory Visit (INDEPENDENT_AMBULATORY_CARE_PROVIDER_SITE_OTHER): Payer: Self-pay | Admitting: Family Medicine

## 2021-09-19 DIAGNOSIS — E559 Vitamin D deficiency, unspecified: Secondary | ICD-10-CM

## 2021-09-24 ENCOUNTER — Encounter (INDEPENDENT_AMBULATORY_CARE_PROVIDER_SITE_OTHER): Payer: Self-pay | Admitting: Family Medicine

## 2021-09-24 ENCOUNTER — Other Ambulatory Visit: Payer: Self-pay | Admitting: Endocrinology

## 2021-09-24 ENCOUNTER — Ambulatory Visit (INDEPENDENT_AMBULATORY_CARE_PROVIDER_SITE_OTHER): Payer: PPO | Admitting: Family Medicine

## 2021-09-24 ENCOUNTER — Other Ambulatory Visit: Payer: Self-pay

## 2021-09-24 VITALS — BP 132/81 | HR 89 | Temp 97.8°F | Ht 61.0 in | Wt 285.0 lb

## 2021-09-24 DIAGNOSIS — E1169 Type 2 diabetes mellitus with other specified complication: Secondary | ICD-10-CM | POA: Diagnosis not present

## 2021-09-24 DIAGNOSIS — Z794 Long term (current) use of insulin: Secondary | ICD-10-CM

## 2021-09-24 DIAGNOSIS — Z6841 Body Mass Index (BMI) 40.0 and over, adult: Secondary | ICD-10-CM | POA: Diagnosis not present

## 2021-09-24 DIAGNOSIS — I10 Essential (primary) hypertension: Secondary | ICD-10-CM

## 2021-09-24 MED ORDER — TIRZEPATIDE 10 MG/0.5ML ~~LOC~~ SOAJ: 10.0000 mg | SUBCUTANEOUS | 0 refills | Status: DC

## 2021-09-24 NOTE — Progress Notes (Signed)
Chief Complaint:   OBESITY Melanie Reeves is here to discuss her progress with her obesity treatment plan along with follow-up of her obesity related diagnoses. Melanie Reeves is on keeping a food journal and adhering to recommended goals of 1300-1600 calories and 85+ grams of protein daily and states she is following her eating plan approximately 20% of the time. Melanie Reeves states she is doing 0 minutes 0 times per week.  Today's visit was #: 13 Starting weight: 294 lbs Starting date: 11/29/2020 Today's weight: 285 lbs Today's date: 09/24/2021 Total lbs lost to date: 9 Total lbs lost since last in-office visit: 0  Interim History: Melanie Reeves did well with portion control and making smarter choices while on vacation. She did some celebration eating but she tried to increase her protein. She is ready to get back on track.  Subjective:   1. Type 2 diabetes mellitus with other specified complication, with long-term current use of insulin (HCC) Melanie Reeves didn't check her blood sugars while on vacation. She has been compliant with her medications, and she has tried to decrease simple carbohydrates but she struggled more recently. She notes decreased polyphagia on Mounjaro.  2. Essential hypertension Melanie Reeves's blood pressure is stable on her medications. She is taking Lasix more regularly and she is on 1/2 pill of KCI with normal K+ levels.  Assessment/Plan:   1. Type 2 diabetes mellitus with other specified complication, with long-term current use of insulin (HCC) Melanie Reeves will continue Mounjaro 10 mg and we will refill for 1 month. Good blood sugar control is important to decrease the likelihood of diabetic complications such as nephropathy, neuropathy, limb loss, blindness, coronary artery disease, and death. Intensive lifestyle modification including diet, exercise and weight loss are the first line of treatment for diabetes.   - tirzepatide (MOUNJARO) 10 MG/0.5ML Pen; Inject 10 mg into the skin once a week.  Dispense:  2 mL; Refill: 0  2. Essential hypertension Melanie Reeves will continue with her diet, exercise, and decreasing sodium in her diet, and will continue to monitor as she continues her lifestyle modifications.  3. Obesity with current BMI of 53.9 Melanie Reeves is currently in the action stage of change. As such, her goal is to continue with weight loss efforts. She has agreed to keeping a food journal and adhering to recommended goals of 1300-1600 calories and 85+ grams of protein daily.   Behavioral modification strategies: meal planning and cooking strategies.  Melanie Reeves has agreed to follow-up with our clinic in 4 weeks. She was informed of the importance of frequent follow-up visits to maximize her success with intensive lifestyle modifications for her multiple health conditions.   Objective:   Blood pressure 132/81, pulse 89, temperature 97.8 F (36.6 C), height 5\' 1"  (1.549 m), weight 285 lb (129.3 kg), SpO2 97 %. Body mass index is 53.85 kg/m.  General: Cooperative, alert, well developed, in no acute distress. HEENT: Conjunctivae and lids unremarkable. Cardiovascular: Regular rhythm.  Lungs: Normal work of breathing. Neurologic: No focal deficits.   Lab Results  Component Value Date   CREATININE 0.90 08/23/2021   BUN 32 (H) 08/23/2021   NA 144 08/23/2021   K 4.8 08/23/2021   CL 104 08/23/2021   CO2 22 08/23/2021   Lab Results  Component Value Date   ALT 20 07/15/2021   AST 20 07/15/2021   ALKPHOS 44 07/15/2021   BILITOT 0.3 07/15/2021   Lab Results  Component Value Date   HGBA1C 6.5 07/15/2021   HGBA1C 6.5 03/19/2021   HGBA1C  6.8 (H) 12/18/2020   HGBA1C 8.2 (H) 11/24/2020   HGBA1C 7.7 (H) 09/17/2020   No results found for: INSULIN Lab Results  Component Value Date   TSH 1.217 11/24/2020   Lab Results  Component Value Date   CHOL 140 07/15/2021   HDL 49.50 07/15/2021   LDLCALC 60 07/15/2021   LDLDIRECT 72.0 02/28/2020   TRIG 153.0 (H) 07/15/2021   CHOLHDL 3 07/15/2021    Lab Results  Component Value Date   VD25OH 36.2 06/05/2021   VD25OH 19.7 (L) 11/29/2020   Lab Results  Component Value Date   WBC 13.9 (H) 08/23/2021   HGB 13.1 08/23/2021   HCT 41.4 08/23/2021   MCV 86 08/23/2021   PLT 295 08/23/2021   Lab Results  Component Value Date   FERRITIN 264 10/25/2020    Obesity Behavioral Intervention:   Approximately 15 minutes were spent on the discussion below.  ASK: We discussed the diagnosis of obesity with Henrene today and Reyanna agreed to give [place] permission to discuss obesity behavioral modification therapy today.  ASSESS: Desteney has the diagnosis of obesity and her BMI today is 53.9. Rein is in the action stage of change.   ADVISE: Nanny was educated on the multiple health risks of obesity as well as the benefit of weight loss to improve her health. She was advised of the need for long term treatment and the importance of lifestyle modifications to improve her current health and to decrease her risk of future health problems.  AGREE: Multiple dietary modification options and treatment options were discussed and Lesslie agreed to follow the recommendations documented in the above note.  ARRANGE: Teionna was educated on the importance of frequent visits to treat obesity as outlined per CMS and USPSTF guidelines and agreed to schedule her next follow up appointment today.  Attestation Statements:   Reviewed by clinician on day of visit: allergies, medications, problem list, medical history, surgical history, family history, social history, and previous encounter notes.   I, Trixie Dredge, am acting as transcriptionist for Dennard Nip, MD.  I have reviewed the above documentation for accuracy and completeness, and I agree with the above. -  Dennard Nip, MD

## 2021-09-25 ENCOUNTER — Ambulatory Visit (INDEPENDENT_AMBULATORY_CARE_PROVIDER_SITE_OTHER): Payer: PPO | Admitting: Family Medicine

## 2021-09-25 DIAGNOSIS — M67912 Unspecified disorder of synovium and tendon, left shoulder: Secondary | ICD-10-CM | POA: Diagnosis not present

## 2021-10-12 ENCOUNTER — Other Ambulatory Visit (INDEPENDENT_AMBULATORY_CARE_PROVIDER_SITE_OTHER): Payer: Self-pay | Admitting: Family Medicine

## 2021-10-12 DIAGNOSIS — E559 Vitamin D deficiency, unspecified: Secondary | ICD-10-CM

## 2021-10-15 NOTE — Telephone Encounter (Signed)
Dr.Beasley 

## 2021-10-16 ENCOUNTER — Encounter (INDEPENDENT_AMBULATORY_CARE_PROVIDER_SITE_OTHER): Payer: Self-pay | Admitting: Family Medicine

## 2021-10-16 ENCOUNTER — Ambulatory Visit (INDEPENDENT_AMBULATORY_CARE_PROVIDER_SITE_OTHER): Payer: PPO | Admitting: Family Medicine

## 2021-10-16 ENCOUNTER — Other Ambulatory Visit: Payer: Self-pay

## 2021-10-16 VITALS — BP 133/77 | HR 89 | Temp 98.0°F | Ht 61.0 in | Wt 294.0 lb

## 2021-10-16 DIAGNOSIS — E538 Deficiency of other specified B group vitamins: Secondary | ICD-10-CM | POA: Diagnosis not present

## 2021-10-16 DIAGNOSIS — E559 Vitamin D deficiency, unspecified: Secondary | ICD-10-CM

## 2021-10-16 DIAGNOSIS — Z6841 Body Mass Index (BMI) 40.0 and over, adult: Secondary | ICD-10-CM | POA: Diagnosis not present

## 2021-10-16 DIAGNOSIS — Z794 Long term (current) use of insulin: Secondary | ICD-10-CM

## 2021-10-16 DIAGNOSIS — E1169 Type 2 diabetes mellitus with other specified complication: Secondary | ICD-10-CM | POA: Diagnosis not present

## 2021-10-16 MED ORDER — TIRZEPATIDE 10 MG/0.5ML ~~LOC~~ SOAJ: 12.5000 mg | SUBCUTANEOUS | 0 refills | Status: DC

## 2021-10-16 MED ORDER — TIRZEPATIDE 10 MG/0.5ML ~~LOC~~ SOAJ: 10.0000 mg | SUBCUTANEOUS | 0 refills | Status: DC

## 2021-10-17 LAB — CMP14+EGFR
ALT: 27 IU/L (ref 0–32)
AST: 32 IU/L (ref 0–40)
Albumin/Globulin Ratio: 1.8 (ref 1.2–2.2)
Albumin: 4.7 g/dL (ref 3.8–4.8)
Alkaline Phosphatase: 69 IU/L (ref 44–121)
BUN/Creatinine Ratio: 30 — ABNORMAL HIGH (ref 12–28)
BUN: 32 mg/dL — ABNORMAL HIGH (ref 8–27)
Bilirubin Total: <0.2 mg/dL (ref 0.0–1.2)
CO2: 20 mmol/L (ref 20–29)
Calcium: 9.7 mg/dL (ref 8.7–10.3)
Chloride: 101 mmol/L (ref 96–106)
Creatinine, Ser: 1.08 mg/dL — ABNORMAL HIGH (ref 0.57–1.00)
Globulin, Total: 2.6 g/dL (ref 1.5–4.5)
Glucose: 163 mg/dL — ABNORMAL HIGH (ref 70–99)
Potassium: 4.8 mmol/L (ref 3.5–5.2)
Sodium: 141 mmol/L (ref 134–144)
Total Protein: 7.3 g/dL (ref 6.0–8.5)
eGFR: 56 mL/min/{1.73_m2} — ABNORMAL LOW (ref >59)

## 2021-10-17 LAB — LIPID PANEL WITH LDL/HDL RATIO
Cholesterol, Total: 152 mg/dL (ref 100–199)
HDL: 46 mg/dL (ref >39)
LDL Chol Calc (NIH): 66 mg/dL (ref 0–99)
LDL/HDL Ratio: 1.4 ratio (ref 0.0–3.2)
Triglycerides: 247 mg/dL — ABNORMAL HIGH (ref 0–149)
VLDL Cholesterol Cal: 40 mg/dL (ref 5–40)

## 2021-10-17 LAB — HEMOGLOBIN A1C
Est. average glucose Bld gHb Est-mCnc: 148 mg/dL
Hgb A1c MFr Bld: 6.8 % — ABNORMAL HIGH (ref 4.8–5.6)

## 2021-10-17 LAB — INSULIN, RANDOM: INSULIN: 34.8 u[IU]/mL — ABNORMAL HIGH (ref 2.6–24.9)

## 2021-10-17 LAB — VITAMIN B12: Vitamin B-12: 572 pg/mL (ref 232–1245)

## 2021-10-17 LAB — VITAMIN D 25 HYDROXY (VIT D DEFICIENCY, FRACTURES): Vit D, 25-Hydroxy: 43.1 ng/mL (ref 30.0–100.0)

## 2021-10-17 NOTE — Progress Notes (Signed)
Chief Complaint:   OBESITY Melanie Reeves is here to discuss her progress with her obesity treatment plan along with follow-up of her obesity related diagnoses. Melanie Reeves is on keeping a food journal and adhering to recommended goals of 1300-1600 calories and 85+ grams of protein daily and states she is following her eating plan approximately 30-70% of the time. Melanie Reeves states she is doing 0 minutes 0 times per week.  Today's visit was #: 14 Starting weight: 294 lbs Starting date: 11/29/2020 Today's weight: 294 lbs Today's date: 10/16/2021 Total lbs lost to date: 0 Total lbs lost since last in-office visit: 0  Interim History: Melanie Reeves has gained some weight over the holidays, but she is ready to get back on track with her weight loss efforts. She is open to changing her eating plan and working on hobbies to help her avoid emotional eating behaviors.  Subjective:   1. Type 2 diabetes mellitus with other specified complication, with long-term current use of insulin (HCC) Meeyah is working on her diet but she has struggled with increased polyphagia recently.  2. Vitamin D deficiency Melanie Reeves is on Vit D, and she is due for labs. She still notes fatigue.  3. B12 deficiency Melanie Reeves is on a B12 rich diet, and she is due for labs.  Assessment/Plan:   1. Type 2 diabetes mellitus with other specified complication, with long-term current use of insulin (HCC) We will check labs today. Melanie Reeves agreed to increase Mounjaro to 12.5 mg weekly, and we will refill for 1 month. Good blood sugar control is important to decrease the likelihood of diabetic complications such as nephropathy, neuropathy, limb loss, blindness, coronary artery disease, and death. Intensive lifestyle modification including diet, exercise and weight loss are the first line of treatment for diabetes.   - CMP14+EGFR - Hemoglobin A1c - Insulin, random - Lipid Panel With LDL/HDL Ratio - tirzepatide (MOUNJARO) 10 MG/0.5ML Pen; Inject 12.5 mg into the  skin once a week.  Dispense: 2 mL; Refill: 0  2. Vitamin D deficiency Low Vitamin D level contributes to fatigue and are associated with obesity, breast, and colon cancer. We will check labs today, and Melanie Reeves will follow-up for routine testing of Vitamin D, at least 2-3 times per year to avoid over-replacement.  - VITAMIN D 25 Hydroxy (Vit-D Deficiency, Fractures)  3. B12 deficiency The diagnosis was reviewed with the patient. We will check labs today, and we will follow up ay Melanie Reeves's next visit. Orders and follow up as documented in patient record.  - Vitamin B12  4. Obesity with current BMI of 55.7 Melanie Reeves is currently in the action stage of change. As such, her goal is to continue with weight loss efforts. She has agreed to change to the Category 2 Plan.   Behavioral modification strategies: meal planning and cooking strategies and emotional eating strategies.  Melanie Reeves has agreed to follow-up with our clinic in 3 weeks. She was informed of the importance of frequent follow-up visits to maximize her success with intensive lifestyle modifications for her multiple health conditions.   Melanie Reeves was informed we would discuss her lab results at her next visit unless there is a critical issue that needs to be addressed sooner. Melanie Reeves agreed to keep her next visit at the agreed upon time to discuss these results.  Objective:   Blood pressure 133/77, pulse 89, temperature 98 F (36.7 C), temperature source Oral, height $RemoveBefo'5\' 1"'fTkqoZNHEPH$  (1.549 m), weight 294 lb (133.4 kg), SpO2 90 %. Body mass index is 55.55  kg/m.  General: Cooperative, alert, well developed, in no acute distress. HEENT: Conjunctivae and lids unremarkable. Cardiovascular: Regular rhythm.  Lungs: Normal work of breathing. Neurologic: No focal deficits.   Lab Results  Component Value Date   CREATININE 1.08 (H) 10/16/2021   BUN 32 (H) 10/16/2021   NA 141 10/16/2021   K 4.8 10/16/2021   CL 101 10/16/2021   CO2 20 10/16/2021   Lab Results   Component Value Date   ALT 27 10/16/2021   AST 32 10/16/2021   ALKPHOS 69 10/16/2021   BILITOT <0.2 10/16/2021   Lab Results  Component Value Date   HGBA1C 6.8 (H) 10/16/2021   HGBA1C 6.5 07/15/2021   HGBA1C 6.5 03/19/2021   HGBA1C 6.8 (H) 12/18/2020   HGBA1C 8.2 (H) 11/24/2020   Lab Results  Component Value Date   INSULIN 34.8 (H) 10/16/2021   Lab Results  Component Value Date   TSH 1.217 11/24/2020   Lab Results  Component Value Date   CHOL 152 10/16/2021   HDL 46 10/16/2021   LDLCALC 66 10/16/2021   LDLDIRECT 72.0 02/28/2020   TRIG 247 (H) 10/16/2021   CHOLHDL 3 07/15/2021   Lab Results  Component Value Date   VD25OH 43.1 10/16/2021   VD25OH 36.2 06/05/2021   VD25OH 19.7 (L) 11/29/2020   Lab Results  Component Value Date   WBC 13.9 (H) 08/23/2021   HGB 13.1 08/23/2021   HCT 41.4 08/23/2021   MCV 86 08/23/2021   PLT 295 08/23/2021   Lab Results  Component Value Date   FERRITIN 264 10/25/2020    Obesity Behavioral Intervention:   Approximately 15 minutes were spent on the discussion below.  ASK: We discussed the diagnosis of obesity with Melanie Reeves today and Melanie Reeves agreed to give [place] permission to discuss obesity behavioral modification therapy today.  ASSESS: Melanie Reeves has the diagnosis of obesity and her BMI today is 55.7. Melanie Reeves is in the action stage of change.   ADVISE: Melanie Reeves was educated on the multiple health risks of obesity as well as the benefit of weight loss to improve her health. She was advised of the need for long term treatment and the importance of lifestyle modifications to improve her current health and to decrease her risk of future health problems.  AGREE: Multiple dietary modification options and treatment options were discussed and Melanie Reeves agreed to follow the recommendations documented in the above note.  ARRANGE: Melanie Reeves was educated on the importance of frequent visits to treat obesity as outlined per CMS and USPSTF guidelines and agreed  to schedule her next follow up appointment today.  Attestation Statements:   Reviewed by clinician on day of visit: allergies, medications, problem list, medical history, surgical history, family history, social history, and previous encounter notes.   I, Trixie Dredge, am acting as transcriptionist for Dennard Nip, MD.  I have reviewed the above documentation for accuracy and completeness, and I agree with the above. -  Dennard Nip, MD

## 2021-10-22 DIAGNOSIS — I10 Essential (primary) hypertension: Secondary | ICD-10-CM | POA: Diagnosis not present

## 2021-10-22 DIAGNOSIS — M8580 Other specified disorders of bone density and structure, unspecified site: Secondary | ICD-10-CM | POA: Diagnosis not present

## 2021-10-22 DIAGNOSIS — E1136 Type 2 diabetes mellitus with diabetic cataract: Secondary | ICD-10-CM | POA: Diagnosis not present

## 2021-10-22 DIAGNOSIS — E785 Hyperlipidemia, unspecified: Secondary | ICD-10-CM | POA: Diagnosis not present

## 2021-10-22 DIAGNOSIS — E1142 Type 2 diabetes mellitus with diabetic polyneuropathy: Secondary | ICD-10-CM | POA: Diagnosis not present

## 2021-10-22 DIAGNOSIS — N1831 Chronic kidney disease, stage 3a: Secondary | ICD-10-CM | POA: Diagnosis not present

## 2021-10-22 DIAGNOSIS — E1122 Type 2 diabetes mellitus with diabetic chronic kidney disease: Secondary | ICD-10-CM | POA: Diagnosis not present

## 2021-10-22 DIAGNOSIS — S81812D Laceration without foreign body, left lower leg, subsequent encounter: Secondary | ICD-10-CM | POA: Diagnosis not present

## 2021-10-22 DIAGNOSIS — I4891 Unspecified atrial fibrillation: Secondary | ICD-10-CM | POA: Diagnosis not present

## 2021-10-30 ENCOUNTER — Other Ambulatory Visit: Payer: Self-pay | Admitting: Endocrinology

## 2021-11-06 ENCOUNTER — Encounter (INDEPENDENT_AMBULATORY_CARE_PROVIDER_SITE_OTHER): Payer: Self-pay | Admitting: Family Medicine

## 2021-11-06 ENCOUNTER — Ambulatory Visit (INDEPENDENT_AMBULATORY_CARE_PROVIDER_SITE_OTHER): Payer: PPO | Admitting: Family Medicine

## 2021-11-06 ENCOUNTER — Other Ambulatory Visit (HOSPITAL_COMMUNITY): Payer: Self-pay

## 2021-11-06 ENCOUNTER — Other Ambulatory Visit: Payer: Self-pay

## 2021-11-06 VITALS — BP 139/81 | HR 89 | Temp 97.4°F | Ht 61.0 in | Wt 291.0 lb

## 2021-11-06 DIAGNOSIS — E1169 Type 2 diabetes mellitus with other specified complication: Secondary | ICD-10-CM | POA: Diagnosis not present

## 2021-11-06 DIAGNOSIS — Z794 Long term (current) use of insulin: Secondary | ICD-10-CM | POA: Diagnosis not present

## 2021-11-06 DIAGNOSIS — E559 Vitamin D deficiency, unspecified: Secondary | ICD-10-CM | POA: Diagnosis not present

## 2021-11-06 DIAGNOSIS — Z6841 Body Mass Index (BMI) 40.0 and over, adult: Secondary | ICD-10-CM | POA: Diagnosis not present

## 2021-11-06 MED ORDER — VITAMIN D (ERGOCALCIFEROL) 1.25 MG (50000 UNIT) PO CAPS: 50000.0000 [IU] | ORAL_CAPSULE | ORAL | 0 refills | Status: DC

## 2021-11-06 MED ORDER — TIRZEPATIDE 12.5 MG/0.5ML ~~LOC~~ SOAJ
12.5000 mg | SUBCUTANEOUS | 0 refills | Status: DC
  Filled 2021-11-06: qty 6, 84d supply, fill #0

## 2021-11-06 NOTE — Progress Notes (Signed)
Chief Complaint:   OBESITY Melanie Reeves is here to discuss her progress with her obesity treatment plan along with follow-up of her obesity related diagnoses. Melanie Reeves is on the Category 2 Plan and states she is following her eating plan approximately 10% of the time. Melanie Reeves states she is doing 0 minutes 0 times per week.  Today's visit was #: 15 Starting weight: 294 lbs Starting date: 11/29/2020 Today's weight: 291 lbs Today's date: 11/06/2021 Total lbs lost to date: 3 Total lbs lost since last in-office visit: 3  Interim History: Melanie Reeves continues to do well with weight loss. Her hunger is still a challenge at times. She is not doing formal exercise, but she has tried to be more active.  Subjective:   1. Type 2 diabetes mellitus with other specified complication, with long-term current use of insulin (HCC) Melanie Reeves is doing well with diet and exercise. She is still struggling with some polyphagia.  2. Vitamin D deficiency Melanie Reeves is stable on Vit D.  Assessment/Plan:   1. Type 2 diabetes mellitus with other specified complication, with long-term current use of insulin (Melanie Reeves) Melanie Reeves agreed to increase Mounjaro to 12.5 mg weekly, with no refills. Good blood sugar control is important to decrease the likelihood of diabetic complications such as nephropathy, neuropathy, limb loss, blindness, coronary artery disease, and death. Intensive lifestyle modification including diet, exercise and weight loss are the first line of treatment for diabetes.   - tirzepatide (MOUNJARO) 12.5 MG/0.5ML Pen; Inject 12.5 mg into the skin once a week.  Dispense: 6 mL; Refill: 0  2. Vitamin D deficiency We will refill prescription Vitamin D 50,000 IU every week for 1 month. Melanie Reeves will follow-up for routine testing of Vitamin D, at least 2-3 times per year to avoid over-replacement.  - Vitamin D, Ergocalciferol, (DRISDOL) 1.25 MG (50000 UNIT) CAPS capsule; Take 1 capsule (50,000 Units total) by mouth every 7 (seven) days.  Saturday  Dispense: 4 capsule; Refill: 0  3. Obesity with current BMI of 55.1 Melanie Reeves is currently in the action stage of change. As such, her goal is to continue with weight loss efforts. She has agreed to keeping a food journal and adhering to recommended goals of 1300-1600 calories and 85+ grams of protein daily.   Behavioral modification strategies: increasing lean protein intake and increasing vegetables.  Melanie Reeves has agreed to follow-up with our clinic in 3 weeks. She was informed of the importance of frequent follow-up visits to maximize her success with intensive lifestyle modifications for her multiple health conditions.   Objective:   Blood pressure 139/81, pulse 89, temperature (!) 97.4 F (36.3 C), temperature source Oral, height 5\' 1"  (1.549 m), weight 291 lb (132 kg), SpO2 97 %. Body mass index is 54.98 kg/m.  General: Cooperative, alert, well developed, in no acute distress. HEENT: Conjunctivae and lids unremarkable. Cardiovascular: Regular rhythm.  Lungs: Normal work of breathing. Neurologic: No focal deficits.   Lab Results  Component Value Date   CREATININE 1.08 (H) 10/16/2021   BUN 32 (H) 10/16/2021   NA 141 10/16/2021   K 4.8 10/16/2021   CL 101 10/16/2021   CO2 20 10/16/2021   Lab Results  Component Value Date   ALT 27 10/16/2021   AST 32 10/16/2021   ALKPHOS 69 10/16/2021   BILITOT <0.2 10/16/2021   Lab Results  Component Value Date   HGBA1C 6.8 (H) 10/16/2021   HGBA1C 6.5 07/15/2021   HGBA1C 6.5 03/19/2021   HGBA1C 6.8 (H) 12/18/2020  HGBA1C 8.2 (H) 11/24/2020   Lab Results  Component Value Date   INSULIN 34.8 (H) 10/16/2021   Lab Results  Component Value Date   TSH 1.217 11/24/2020   Lab Results  Component Value Date   CHOL 152 10/16/2021   HDL 46 10/16/2021   LDLCALC 66 10/16/2021   LDLDIRECT 72.0 02/28/2020   TRIG 247 (H) 10/16/2021   CHOLHDL 3 07/15/2021   Lab Results  Component Value Date   VD25OH 43.1 10/16/2021   VD25OH 36.2  06/05/2021   VD25OH 19.7 (L) 11/29/2020   Lab Results  Component Value Date   WBC 13.9 (H) 08/23/2021   HGB 13.1 08/23/2021   HCT 41.4 08/23/2021   MCV 86 08/23/2021   PLT 295 08/23/2021   Lab Results  Component Value Date   FERRITIN 264 10/25/2020    Obesity Behavioral Intervention:   Approximately 15 minutes were spent on the discussion below.  ASK: We discussed the diagnosis of obesity with Faisa today and Charda agreed to give [place] permission to discuss obesity behavioral modification therapy today.  ASSESS: Jolinda has the diagnosis of obesity and her BMI today is 55.1. Essance is in the action stage of change.   ADVISE: Mazzie was educated on the multiple health risks of obesity as well as the benefit of weight loss to improve her health. She was advised of the need for long term treatment and the importance of lifestyle modifications to improve her current health and to decrease her risk of future health problems.  AGREE: Multiple dietary modification options and treatment options were discussed and Rashawnda agreed to follow the recommendations documented in the above note.  ARRANGE: Alyvia was educated on the importance of frequent visits to treat obesity as outlined per CMS and USPSTF guidelines and agreed to schedule her next follow up appointment today.  Attestation Statements:   Reviewed by clinician on day of visit: allergies, medications, problem list, medical history, surgical history, family history, social history, and previous encounter notes.   I, Trixie Dredge, am acting as transcriptionist for Dennard Nip, MD.  I have reviewed the above documentation for accuracy and completeness, and I agree with the above. -  Dennard Nip, MD

## 2021-11-07 DIAGNOSIS — N179 Acute kidney failure, unspecified: Secondary | ICD-10-CM | POA: Diagnosis not present

## 2021-11-07 DIAGNOSIS — E119 Type 2 diabetes mellitus without complications: Secondary | ICD-10-CM | POA: Diagnosis not present

## 2021-11-07 DIAGNOSIS — I4891 Unspecified atrial fibrillation: Secondary | ICD-10-CM | POA: Diagnosis not present

## 2021-11-07 DIAGNOSIS — F321 Major depressive disorder, single episode, moderate: Secondary | ICD-10-CM | POA: Diagnosis not present

## 2021-11-07 DIAGNOSIS — E1122 Type 2 diabetes mellitus with diabetic chronic kidney disease: Secondary | ICD-10-CM | POA: Diagnosis not present

## 2021-11-07 DIAGNOSIS — E785 Hyperlipidemia, unspecified: Secondary | ICD-10-CM | POA: Diagnosis not present

## 2021-11-07 DIAGNOSIS — M8580 Other specified disorders of bone density and structure, unspecified site: Secondary | ICD-10-CM | POA: Diagnosis not present

## 2021-11-07 DIAGNOSIS — E1142 Type 2 diabetes mellitus with diabetic polyneuropathy: Secondary | ICD-10-CM | POA: Diagnosis not present

## 2021-11-07 DIAGNOSIS — I10 Essential (primary) hypertension: Secondary | ICD-10-CM | POA: Diagnosis not present

## 2021-11-07 DIAGNOSIS — N1831 Chronic kidney disease, stage 3a: Secondary | ICD-10-CM | POA: Diagnosis not present

## 2021-11-08 ENCOUNTER — Other Ambulatory Visit (INDEPENDENT_AMBULATORY_CARE_PROVIDER_SITE_OTHER): Payer: Self-pay | Admitting: Family Medicine

## 2021-11-08 DIAGNOSIS — E559 Vitamin D deficiency, unspecified: Secondary | ICD-10-CM

## 2021-11-11 NOTE — Telephone Encounter (Signed)
Dr.Beasley 

## 2021-11-18 ENCOUNTER — Other Ambulatory Visit: Payer: PPO

## 2021-11-18 DIAGNOSIS — G4733 Obstructive sleep apnea (adult) (pediatric): Secondary | ICD-10-CM | POA: Diagnosis not present

## 2021-11-19 ENCOUNTER — Other Ambulatory Visit: Payer: Self-pay | Admitting: Endocrinology

## 2021-11-19 ENCOUNTER — Other Ambulatory Visit: Payer: PPO

## 2021-11-20 ENCOUNTER — Telehealth: Payer: Self-pay | Admitting: Pharmacy Technician

## 2021-11-20 ENCOUNTER — Other Ambulatory Visit (HOSPITAL_COMMUNITY): Payer: Self-pay

## 2021-11-20 NOTE — Telephone Encounter (Signed)
PA has been submitted.

## 2021-11-20 NOTE — Telephone Encounter (Signed)
Patient Advocate Encounter  Received notification from [facility] OFFICE that prior authorization for [facility] 300MG  is required.   PA submitted on 2.8.23 Key [id number] Status is pending   [facility] will continue to follow  4.8.23, CPhT Patient Advocate [facility] Phone: [phone] Fax:  [phone]

## 2021-11-21 ENCOUNTER — Ambulatory Visit: Payer: PPO | Admitting: Endocrinology

## 2021-11-21 ENCOUNTER — Other Ambulatory Visit (HOSPITAL_COMMUNITY): Payer: Self-pay

## 2021-11-21 NOTE — Telephone Encounter (Signed)
Patient Advocate Encounter  Prior Authorization for [employer] 300mg  tabs has been approved.    PA#  Effective dates: 11/20/21 through 11/20/22  Per Test Claim Patients co-pay is $10.35.   Spoke with Pharmacy to Process.  Patient Advocate Fax: [phone]

## 2021-11-25 ENCOUNTER — Other Ambulatory Visit: Payer: Self-pay

## 2021-11-25 ENCOUNTER — Encounter (INDEPENDENT_AMBULATORY_CARE_PROVIDER_SITE_OTHER): Payer: Self-pay | Admitting: Family Medicine

## 2021-11-25 ENCOUNTER — Ambulatory Visit (INDEPENDENT_AMBULATORY_CARE_PROVIDER_SITE_OTHER): Payer: PPO | Admitting: Family Medicine

## 2021-11-25 VITALS — BP 136/83 | HR 82 | Temp 97.5°F | Ht 61.0 in | Wt 299.0 lb

## 2021-11-25 DIAGNOSIS — M25512 Pain in left shoulder: Secondary | ICD-10-CM

## 2021-11-25 DIAGNOSIS — Z794 Long term (current) use of insulin: Secondary | ICD-10-CM | POA: Diagnosis not present

## 2021-11-25 DIAGNOSIS — E559 Vitamin D deficiency, unspecified: Secondary | ICD-10-CM | POA: Diagnosis not present

## 2021-11-25 DIAGNOSIS — E1169 Type 2 diabetes mellitus with other specified complication: Secondary | ICD-10-CM | POA: Diagnosis not present

## 2021-11-25 DIAGNOSIS — G4733 Obstructive sleep apnea (adult) (pediatric): Secondary | ICD-10-CM

## 2021-11-25 DIAGNOSIS — Z6841 Body Mass Index (BMI) 40.0 and over, adult: Secondary | ICD-10-CM | POA: Diagnosis not present

## 2021-11-25 MED ORDER — VITAMIN D (ERGOCALCIFEROL) 1.25 MG (50000 UNIT) PO CAPS: 50000.0000 [IU] | ORAL_CAPSULE | ORAL | 0 refills | Status: DC

## 2021-11-25 NOTE — Progress Notes (Signed)
Chief Complaint:   OBESITY Melanie Reeves is here to discuss her progress with her obesity treatment plan along with follow-up of her obesity related diagnoses. Melanie Reeves is on keeping a food journal and adhering to recommended goals of 1300-1600 calories and 85+ grams of protein daily and states she is following her eating plan approximately 40% of the time. Melanie Reeves states she is doing 0 minutes 0 times per week.  Today's visit was #: 37 Starting weight: 294 lbs Starting date: 11/29/2020 Today's weight: 299 lbs Today's date: 11/25/2021 Total lbs lost to date: 0 Total lbs lost since last in-office visit: 0  Interim History: [place] has done well with meeting her protein goals. She notes that she is struggling with cravings for sweets. She sleeps late and she is eating more in the evenings. She is sometimes skipping meals.  Subjective:   1. Vitamin D deficiency Melanie Reeves is taking Vit D 50,000 IU one pill weekly, and she denies side effects. Last Vitamin D level was 43.1.  2. Type 2 diabetes mellitus with other specified complication, with long-term current use of insulin (HCC) Melanie Reeves's last A1c was 6.8 and insulin was 34.8. she is taking Mounjaro 12.5 mg, metformin XR 2,000 mg, and Novolog 20-40 (not taking on a regular basis). She is not checking her blood sugars, and she denies hypoglycemia.  3. OSA (obstructive sleep apnea) Melanie Reeves is sleeping in a recliner due to left shoulder pain. She received her CPAP last week. She feels more alert.  4. Left shoulder pain, unspecified chronicity Melanie Reeves is taking Advil and Tylenol. She has had 2 cortisone injections. She cannot sleep in her bed due to pain, and she has to sleep in a recliner.  Assessment/Plan:   1. Vitamin D deficiency We will refill prescription Vitamin D 50,000 IU every week for 1 month. Melanie Reeves will follow-up for routine testing of Vitamin D, at least 2-3 times per year to avoid over-replacement.  - Vitamin D, Ergocalciferol, (DRISDOL) 1.25 MG  (50000 UNIT) CAPS capsule; Take 1 capsule (50,000 Units total) by mouth every 7 (seven) days. Saturday  Dispense: 4 capsule; Refill: 0  2. Type 2 diabetes mellitus with other specified complication, with long-term current use of insulin (HCC) Melanie Reeves will continue to to follow up with Endocrinology, and she will continue her medications as directed. She will continue working n dietary changes and weight loss. Good blood sugar control is important to decrease the likelihood of diabetic complications such as nephropathy, neuropathy, limb loss, blindness, coronary artery disease, and death. Intensive lifestyle modification including diet, exercise and weight loss are the first line of treatment for diabetes.   3. OSA (obstructive sleep apnea) Intensive lifestyle modifications are the first line treatment for this issue. We discussed several lifestyle modifications today. Melanie Reeves will continue her CPAP nightly. She will continue to work on diet, exercise and weight loss efforts. We will continue to monitor. Orders and follow up as documented in patient record.   4. Left shoulder pain, unspecified chronicity Melanie Reeves plans to make a follow up appointment with Orthopedic.  5. Obesity with current BMI of 56.6 Melanie Reeves is currently in the action stage of change. As such, her goal is to continue with weight loss efforts. She has agreed to the Category 2 Plan.   Behavioral modification strategies: increasing lean protein intake, increasing water intake, decreasing sodium intake, no skipping meals, and better snacking choices.  Melanie Reeves has agreed to follow-up with our clinic in 3 weeks. She was informed of the importance  of frequent follow-up visits to maximize her success with intensive lifestyle modifications for her multiple health conditions.   Objective:   Blood pressure 136/83, pulse 82, temperature (!) 97.5 F (36.4 C), temperature source Oral, height 5\' 1"  (1.549 m), weight 299 lb (135.6 kg), SpO2 94 %. Body  mass index is 56.5 kg/m.  General: Cooperative, alert, well developed, in no acute distress. HEENT: Conjunctivae and lids unremarkable. Cardiovascular: Regular rhythm.  Lungs: Normal work of breathing. Neurologic: No focal deficits.   Lab Results  Component Value Date   CREATININE 1.08 (H) 10/16/2021   BUN 32 (H) 10/16/2021   NA 141 10/16/2021   K 4.8 10/16/2021   CL 101 10/16/2021   CO2 20 10/16/2021   Lab Results  Component Value Date   ALT 27 10/16/2021   AST 32 10/16/2021   ALKPHOS 69 10/16/2021   BILITOT <0.2 10/16/2021   Lab Results  Component Value Date   HGBA1C 6.8 (H) 10/16/2021   HGBA1C 6.5 07/15/2021   HGBA1C 6.5 03/19/2021   HGBA1C 6.8 (H) 12/18/2020   HGBA1C 8.2 (H) 11/24/2020   Lab Results  Component Value Date   INSULIN 34.8 (H) 10/16/2021   Lab Results  Component Value Date   TSH 1.217 11/24/2020   Lab Results  Component Value Date   CHOL 152 10/16/2021   HDL 46 10/16/2021   LDLCALC 66 10/16/2021   LDLDIRECT 72.0 02/28/2020   TRIG 247 (H) 10/16/2021   CHOLHDL 3 07/15/2021   Lab Results  Component Value Date   VD25OH 43.1 10/16/2021   VD25OH 36.2 06/05/2021   VD25OH 19.7 (L) 11/29/2020   Lab Results  Component Value Date   WBC 13.9 (H) 08/23/2021   HGB 13.1 08/23/2021   HCT 41.4 08/23/2021   MCV 86 08/23/2021   PLT 295 08/23/2021   Lab Results  Component Value Date   FERRITIN 264 10/25/2020    Obesity Behavioral Intervention:   Approximately 15 minutes were spent on the discussion below.  ASK: We discussed the diagnosis of obesity with Melanie Reeves today and Melanie Reeves agreed to give [place] permission to discuss obesity behavioral modification therapy today.  ASSESS: Melanie Reeves has the diagnosis of obesity and her BMI today is 56.6. Melanie Reeves is in the action stage of change.   ADVISE: Melanie Reeves was educated on the multiple health risks of obesity as well as the benefit of weight loss to improve her health. She was advised of the need for long term  treatment and the importance of lifestyle modifications to improve her current health and to decrease her risk of future health problems.  AGREE: Multiple dietary modification options and treatment options were discussed and Melanie Reeves agreed to follow the recommendations documented in the above note.  ARRANGE: Melanie Reeves was educated on the importance of frequent visits to treat obesity as outlined per CMS and USPSTF guidelines and agreed to schedule her next follow up appointment today.  Attestation Statements:   Reviewed by clinician on day of visit: allergies, medications, problem list, medical history, surgical history, family history, social history, and previous encounter notes.   I, Melanie Reeves, am acting as transcriptionist for Dennard Nip, MD.  I have reviewed the above documentation for accuracy and completeness, and I agree with the above. -  Dennard Nip, MD

## 2021-11-26 ENCOUNTER — Other Ambulatory Visit: Payer: Self-pay

## 2021-11-26 ENCOUNTER — Other Ambulatory Visit (INDEPENDENT_AMBULATORY_CARE_PROVIDER_SITE_OTHER): Payer: PPO

## 2021-11-26 DIAGNOSIS — E1165 Type 2 diabetes mellitus with hyperglycemia: Secondary | ICD-10-CM | POA: Diagnosis not present

## 2021-11-26 DIAGNOSIS — Z794 Long term (current) use of insulin: Secondary | ICD-10-CM | POA: Diagnosis not present

## 2021-11-26 LAB — HEMOGLOBIN A1C: Hgb A1c MFr Bld: 6.9 % — ABNORMAL HIGH (ref 4.6–6.5)

## 2021-11-27 LAB — BASIC METABOLIC PANEL
BUN: 35 mg/dL — ABNORMAL HIGH (ref 6–23)
CO2: 27 mEq/L (ref 19–32)
Calcium: 9.9 mg/dL (ref 8.4–10.5)
Chloride: 106 mEq/L (ref 96–112)
Creatinine, Ser: 0.97 mg/dL (ref 0.40–1.20)
GFR: 60.03 mL/min (ref >60.00)
Glucose, Bld: 110 mg/dL — ABNORMAL HIGH (ref 70–99)
Potassium: 5 mEq/L (ref 3.5–5.1)
Sodium: 141 mEq/L (ref 135–145)

## 2021-11-28 ENCOUNTER — Ambulatory Visit: Payer: PPO | Admitting: Endocrinology

## 2021-11-28 ENCOUNTER — Encounter: Payer: Self-pay | Admitting: Endocrinology

## 2021-11-28 ENCOUNTER — Other Ambulatory Visit: Payer: Self-pay

## 2021-11-28 VITALS — BP 140/72 | HR 82 | Ht 61.0 in | Wt 303.0 lb

## 2021-11-28 DIAGNOSIS — Z794 Long term (current) use of insulin: Secondary | ICD-10-CM

## 2021-11-28 DIAGNOSIS — E1165 Type 2 diabetes mellitus with hyperglycemia: Secondary | ICD-10-CM

## 2021-11-28 DIAGNOSIS — G4733 Obstructive sleep apnea (adult) (pediatric): Secondary | ICD-10-CM | POA: Diagnosis not present

## 2021-11-28 DIAGNOSIS — E1142 Type 2 diabetes mellitus with diabetic polyneuropathy: Secondary | ICD-10-CM

## 2021-11-28 MED ORDER — DEXCOM G6 SENSOR MISC: 3 refills | Status: DC

## 2021-11-28 MED ORDER — DEXCOM G6 TRANSMITTER MISC: 1.0000 | Freq: Once | 1 refills | Status: AC

## 2021-11-28 MED ORDER — PREGABALIN 100 MG PO CAPS: 100.0000 mg | ORAL_CAPSULE | Freq: Two times a day (BID) | ORAL | 2 refills | Status: DC

## 2021-11-28 NOTE — Progress Notes (Signed)
Patient ID: Melanie Reeves, female   DOB: [birth year], 71 y.o.   MRN: [id number]           Reason for Appointment: Follow-up  for Type 2 Diabetes  Referring physician: Yaakov Guthrie   History of Present Illness:          Date of diagnosis of type 2 diabetes mellitus:  2011       Background history:     She was tested for diabetes when she presented with numbness and tingling in her feet to the health [facility]. She does not know what her blood sugars were initially  She was however started on both metformin and glipizide at that time. She thinks that in early 2015 she was started on Lantus insulin also developed poor control  Her record indicates A1c levels ranging from 8.2-9.3 and higher at 11.4 in 9/16 She had been started on Victoza in 12/16  Recent history:   INSULIN regimen is: Toujeo 154 U in am, NovoLog 20- 30-40  AC  Non-insulin hypoglycemic drugs the patient is taking are: Invokana 300 mg,  Metformin ER 2000 mg daily, Mounjaro 12.24m  A1c is 6.9 compared to 6.5    Current blood sugar patterns and problems identified: [facility] and is now taking 12.5 mg weekly for at least a month No side effects like nausea However she just got a 377-monthupply of the same dose She did not bring her monitor for download and does not appear to be checking her blood sugars much Also does not remember her blood sugar readings She had a late fasting glucose of 110 on the lab in the afternoon Despite using Mounjaro instead of Victoza she is not able to watch her diet and getting carbohydrate snacks, not controlling portions consistently Her weight has also gone up since last visit when she was 286 pounds She does not appear to be checking her fasting blood sugars lately Mostly checking blood sugars randomly during the day but not after supper  She says she is trying to adjust her NovoLog at her mealtimes based on how  much she is eating and amounts of carbohydrate, taking the most at dinnertime for large carbohydrate portions However not covering late evening snacks like popcorn No hypoglycemia  Dinner variable, usually late   Side effects from medications have been: none  Blood sugar readings not available By recall:   PRE-MEAL Fasting Lunch Dinner Bedtime Overall  Glucose range: ? ? 146? ? 132-200  Mean/median:         Prior   PRE-MEAL Fasting Lunch Dinner Bedtime Overall  Glucose range: 103-151  92-184 112-154 77-184  Mean/median: 129  135  126   POST-MEAL PC Breakfast PC Lunch PC Dinner  Glucose range:   ?  Mean/median:        Self-care: The diet that the patient has been following is: None, not restricting carbohydrates and fats;   eating more starchy foods and bread at times    Meal times: Breakfast:10-11 AM Lunch:4 PM Dinner: 7 pm    Typical meal intake: Breakfast is eggs/ low fat meat..  Lunch.  Evening meal is meat, vegetables,, sometimes [place]               Dietician visit, most recent: Years ago                Weight history: Lowest 178 lb several years ago,  more recently 300-330  Wt Readings from Last 3 Encounters:  11/28/21 (!) 303 lb (137.4 kg)  11/25/21 299 lb (135.6 kg)  11/06/21 291 lb (132 kg)   Glycemic control:   Lab Results  Component Value Date   HGBA1C 6.9 (H) 11/26/2021   HGBA1C 6.8 (H) 10/16/2021   HGBA1C 6.5 07/15/2021   Lab Results  Component Value Date   MICROALBUR <0.7 07/15/2021   LDLCALC 66 10/16/2021   CREATININE 0.97 11/26/2021      Lab on 11/26/2021  Component Date Value Ref Range Status   Sodium 11/26/2021 141  135 - 145 mEq/L Final   Potassium 11/26/2021 5.0  3.5 - 5.1 mEq/L Final   Chloride 11/26/2021 106  96 - 112 mEq/L Final   CO2 11/26/2021 27  19 - 32 mEq/L Final   Glucose, Bld 11/26/2021 110 (H)  70 - 99 mg/dL Final   BUN 11/26/2021 35 (H)  6 - 23 mg/dL Final   Creatinine, Ser 11/26/2021 0.97  0.40 - 1.20 mg/dL  Final   GFR 11/26/2021 60.03  >60.00 mL/min Final   Calculated using the CKD-EPI Creatinine Equation (2021)   Calcium 11/26/2021 9.9  8.4 - 10.5 mg/dL Final   Hgb A1c MFr Bld 11/26/2021 6.9 (H)  4.6 - 6.5 % Final   Glycemic Control Guidelines for People with Diabetes:Non Diabetic:  <6%Goal of Therapy: <7%Additional Action Suggested:  >8%       Allergies as of 11/28/2021       Reactions   Simvastatin Other (See Comments)   Other reaction(s): leg weakness Other reaction(s): leg weakness   Sulfa Antibiotics Other (See Comments)   Cant remember Other reaction(s): as a child        Medication List        Accurate as of November 28, 2021  4:44 PM. If you have any questions, ask your nurse or doctor.          atenolol 25 MG tablet Commonly known as: TENORMIN Take 1 tablet (25 mg total) by mouth daily.   B-D ULTRAFINE III SHORT PEN 31G X 8 MM Misc Generic drug: Insulin Pen Needle USE TO INJECT MEDICATION 5 TIMES DAILY.   cyclobenzaprine 5 MG tablet Commonly known as: FLEXERIL Take 5 mg by mouth 3 (three) times daily as needed for muscle spasms.   diphenhydrAMINE 25 mg capsule Commonly known as: BENADRYL Take 50 mg by mouth at bedtime as needed for sleep.   DULoxetine 60 MG capsule Commonly known as: CYMBALTA Take 60 mg by mouth daily. Taking in the AM   DULoxetine 30 MG capsule Commonly known as: CYMBALTA TAKE 1 CAPSULE BY MOUTH DAILY. TAKE IN ADDITION TO THE 60 MG   fenofibrate 145 MG tablet Commonly known as: TRICOR TAKE 1 TABLET BY MOUTH EVERY DAY   Fish Oil 1000 MG Caps Take 2,000 mg by mouth daily.   furosemide 40 MG tablet Commonly known as: LASIX Take 1 tablet (40 mg total) by mouth every Monday, Wednesday, and Friday.   gabapentin 600 MG tablet Commonly known as: NEURONTIN TAKE 1 TABLET BY MOUTH 3 TIMES A DAY AND 1 TABLET AT BEDTIME   HYDROcodone-acetaminophen 5-325 MG tablet Commonly known as: NORCO/VICODIN Take 1 tablet by mouth every 6 (six)  hours as needed for moderate pain.   ibuprofen 200 MG tablet Commonly known as: ADVIL Take 400 mg by mouth every 6 (six) hours as needed for fever.   Invokana 300 MG Tabs tablet Generic drug: canagliflozin TAKE 1  TABLET (300 MG TOTAL) BY MOUTH DAILY BEFORE BREAKFAST.   loratadine 10 MG tablet Commonly known as: CLARITIN Take 10 mg by mouth daily.   metFORMIN 500 MG 24 hr tablet Commonly known as: GLUCOPHAGE-XR TAKE 4 TABLETS (2,000 MG TOTAL) BY MOUTH DAILY WITH SUPPER.   Mounjaro 12.5 MG/0.5ML Pen Generic drug: tirzepatide Inject 12.5 mg into the skin once a week.   NovoLOG FlexPen 100 UNIT/ML FlexPen Generic drug: insulin aspart Inject 20-40 Units into the skin 3 (three) times daily with meals.   OneTouch Ultra test strip Generic drug: glucose blood USE AS INSTRUCTED TO CHECK BLOOD SUGAR THREE TIMES DAILY.   OneTouch Verio w/Device Kit UAD to monitor glucose tid; Dx: E11.65   potassium chloride 10 MEQ CR capsule Commonly known as: MICRO-K Take 1 capsule (10 mEq total) by mouth every Monday, Wednesday, and Friday. Along with the lasix   pravastatin 40 MG tablet Commonly known as: PRAVACHOL TAKE 1 TABLET BY MOUTH EVERY DAY   Toujeo Max SoloStar 300 UNIT/ML Solostar Pen Generic drug: insulin glargine (2 Unit Dial) INJECT 140 UNITS INTO THE SKIN DAILY. Marland Kitchen What changed: See the new instructions.   Vitamin D (Ergocalciferol) 1.25 MG (50000 UNIT) Caps capsule Commonly known as: DRISDOL Take 1 capsule (50,000 Units total) by mouth every 7 (seven) days. Saturday   Xarelto 20 MG Tabs tablet Generic drug: rivaroxaban TAKE 1 TABLET BY MOUTH DAILY WITH SUPPER.        Allergies:  Allergies  Allergen Reactions   Simvastatin Other (See Comments)    Other reaction(s): leg weakness Other reaction(s): leg weakness   Sulfa Antibiotics Other (See Comments)    Cant remember Other reaction(s): as a child    Past Medical History:  Diagnosis Date   Abnormal  electrocardiogram 08/14/2014   LBBB   Anemia    Anxiety    Arthritis    Atrial fibrillation (HCC)    Bilateral swelling of feet    Chest pain    Constipation    Depression    Diabetes mellitus ([place])    Difficulty walking    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   Qualifier: Diagnosis of  By: Aline Brochure MD, [place]     Dyspnea 08/14/2014   Edema    Gallbladder problem    GERD (gastroesophageal reflux disease)    Hyperlipidemia    Hypertension    LOW BACK PAIN 12/20/2009   Qualifier: Diagnosis of  By: Aline Brochure MD, Stanley     Morbid obesity ([place])    OSA (obstructive sleep apnea) 08/16/2014   Osteoarthritis    Other fatigue    Palpitations    Seasonal allergies    Shortness of breath    Shortness of breath on exertion    Sinusitis    SPINAL STENOSIS 12/20/2009   Qualifier: Diagnosis of  By: Aline Brochure MD, [place]     Stage 3 chronic kidney disease [facility])     Past Surgical History:  Procedure Laterality Date   CHOLECYSTECTOMY     COLONOSCOPY WITH PROPOFOL N/A 11/29/2014   Procedure: COLONOSCOPY WITH PROPOFOL;  Surgeon: Arta Silence, MD;  Location: [facility];  Service: Endoscopy;  Laterality: N/A;   SKIN CANCER EXCISION     TONSILLECTOMY      Family History  Problem Relation Age of Onset   Heart disease Father        MI   Cancer - Lung Father        Small cell Lung CA   Diabetes Father    Hypertension  Father    Hyperlipidemia Father    Cancer Father    Depression Father    Anxiety disorder Father    Hypertension Mother    Hyperlipidemia Mother    Stroke Mother    Kidney disease Mother    Depression Mother    Anxiety disorder Mother    Bipolar disorder Mother    Eating disorder Mother    Obesity Mother     Social History:  reports that she quit smoking about 28 years ago. Her smoking use included cigarettes. She has a 40.00 pack-year smoking history. She has quit using smokeless tobacco. She reports current alcohol use. She reports that she does not use drugs.     Review of Systems    Lipid history:    Triglycerides have been over 400 at baseline and she is taking fenofibrate in addition to her pravastatin  She is taking pravastatin; she had intolerance to simvastatin before LDL is below 100 Triglycerides are below 200    Lab Results  Component Value Date   CHOL 152 10/16/2021   CHOL 140 07/15/2021   CHOL 141 09/17/2020   Lab Results  Component Value Date   HDL 46 10/16/2021   HDL 49.50 07/15/2021   HDL 42.40 09/17/2020   Lab Results  Component Value Date   LDLCALC 66 10/16/2021   LDLCALC 60 07/15/2021   LDLCALC 65 09/17/2020   Lab Results  Component Value Date   TRIG 247 (H) 10/16/2021   TRIG 153.0 (H) 07/15/2021   TRIG 127 10/23/2020   Lab Results  Component Value Date   CHOLHDL 3 07/15/2021   CHOLHDL 3 09/17/2020   CHOLHDL 4 02/28/2020   Lab Results  Component Value Date   LDLDIRECT 72.0 02/28/2020   LDLDIRECT 62.0 07/22/2019   LDLDIRECT 83.0 01/11/2019            .  Most recent eye exam was In 7/21, report not available   Hypertension:  On treatment from PCP; taking atenolol 50 mg and Hytrin 2 mg Also on Invokana   BP Readings from Last 3 Encounters:  11/28/21 140/72  11/25/21 136/83  11/06/21 139/81   Electrolytes: She is  on Lasix  Lab Results  Component Value Date   K 5.0 11/26/2021     NEUROPATHY: Has persistent numbness for in her feet the last several years along with some pain and weakness  She is taking Cymbalta a total of 90 mg a day and 600 mg of gabapentin 3 times daily However she still feels some tingling and at times has sharp electric sensations in her legs  Last foot exam in 7/21 done by her PCP, previous findings:   Monofilament sensation is decreased distally on all the toes and distal plantar surfaces   Physical Examination:  BP 140/72    Pulse 82    Ht _0  (1.549 m)    Wt (!) 303 lb (137.4 kg)    SpO2 94%    BMI 57.25 kg/m   Her lower legs show some swelling but no  significant pitting  ASSESSMENT:  Diabetes type 2,  with morbid obesity on insulin  See history of present illness for detailed discussion of  current management, blood sugar patterns and problems identified   She is on basal bolus insulin, Invokana and Mounjaro  Her A1c is slightly higher at 6.8  She still taking relatively large amount of 154 units of basal insulin along with up to 40 units of mealtime doses Despite  taking near maximal doses of Mounjaro she has not lost weight and is not able to watch her diet as much This may be partly related to stress eating Not clear what her blood sugars are at home and she did not bring a monitor Previously freestyle libre had been significantly inaccurate  Also on Invokana which she thinks she is taking regularly   Neuropathy: She appears to have more paresthesiae despite taking gabapentin and Cymbalta    PLAN:   No change in insulin as yet but need to start checking her blood sugars regularly She should go up to 15 mg of Mounjaro but needs to use up her current supply Needs to work more on controlling her caloric and carbohydrate intake Will defer any management including counseling to bariatric specialist  Also may be a candidate for insulin pump with U-500 insulin but likely this will be too difficult for her  She needs to bring her monitor for download on next visit Discussed possible use of Dexcom if this is now covered and we will send another prescription However if this is not affordable may again try freestyle libre  Trial of Lyrica 100 mg twice daily instead of gabapentin and continue Cymbalta to see if her symptoms are better controlled  More regular follow-up  There are no Patient Instructions on file for this visit.      Elayne Snare 11/28/2021, 4:44 PM   Note: This office note was prepared with Dragon voice recognition system technology. Any transcriptional errors that result from this process are  unintentional.

## 2021-12-01 ENCOUNTER — Other Ambulatory Visit (INDEPENDENT_AMBULATORY_CARE_PROVIDER_SITE_OTHER): Payer: Self-pay | Admitting: Family Medicine

## 2021-12-01 DIAGNOSIS — E559 Vitamin D deficiency, unspecified: Secondary | ICD-10-CM

## 2021-12-09 DIAGNOSIS — G4733 Obstructive sleep apnea (adult) (pediatric): Secondary | ICD-10-CM | POA: Diagnosis not present

## 2021-12-11 DIAGNOSIS — M7502 Adhesive capsulitis of left shoulder: Secondary | ICD-10-CM | POA: Diagnosis not present

## 2021-12-16 ENCOUNTER — Ambulatory Visit (INDEPENDENT_AMBULATORY_CARE_PROVIDER_SITE_OTHER): Payer: PPO | Admitting: Family Medicine

## 2021-12-18 DIAGNOSIS — M25512 Pain in left shoulder: Secondary | ICD-10-CM | POA: Diagnosis not present

## 2021-12-18 DIAGNOSIS — M25612 Stiffness of left shoulder, not elsewhere classified: Secondary | ICD-10-CM | POA: Diagnosis not present

## 2021-12-18 DIAGNOSIS — M6281 Muscle weakness (generalized): Secondary | ICD-10-CM | POA: Diagnosis not present

## 2021-12-25 DIAGNOSIS — M25612 Stiffness of left shoulder, not elsewhere classified: Secondary | ICD-10-CM | POA: Diagnosis not present

## 2021-12-25 DIAGNOSIS — M6281 Muscle weakness (generalized): Secondary | ICD-10-CM | POA: Diagnosis not present

## 2021-12-25 DIAGNOSIS — M25512 Pain in left shoulder: Secondary | ICD-10-CM | POA: Diagnosis not present

## 2021-12-26 DIAGNOSIS — G4733 Obstructive sleep apnea (adult) (pediatric): Secondary | ICD-10-CM | POA: Diagnosis not present

## 2021-12-27 ENCOUNTER — Other Ambulatory Visit: Payer: Self-pay | Admitting: Endocrinology

## 2021-12-27 DIAGNOSIS — M6281 Muscle weakness (generalized): Secondary | ICD-10-CM | POA: Diagnosis not present

## 2021-12-27 DIAGNOSIS — M25512 Pain in left shoulder: Secondary | ICD-10-CM | POA: Diagnosis not present

## 2021-12-27 DIAGNOSIS — M25612 Stiffness of left shoulder, not elsewhere classified: Secondary | ICD-10-CM | POA: Diagnosis not present

## 2021-12-30 DIAGNOSIS — M25512 Pain in left shoulder: Secondary | ICD-10-CM | POA: Diagnosis not present

## 2021-12-30 DIAGNOSIS — M25612 Stiffness of left shoulder, not elsewhere classified: Secondary | ICD-10-CM | POA: Diagnosis not present

## 2021-12-30 DIAGNOSIS — M6281 Muscle weakness (generalized): Secondary | ICD-10-CM | POA: Diagnosis not present

## 2022-01-01 ENCOUNTER — Other Ambulatory Visit (INDEPENDENT_AMBULATORY_CARE_PROVIDER_SITE_OTHER): Payer: Self-pay | Admitting: Family Medicine

## 2022-01-01 DIAGNOSIS — E559 Vitamin D deficiency, unspecified: Secondary | ICD-10-CM

## 2022-01-02 DIAGNOSIS — M25612 Stiffness of left shoulder, not elsewhere classified: Secondary | ICD-10-CM | POA: Diagnosis not present

## 2022-01-02 DIAGNOSIS — M25512 Pain in left shoulder: Secondary | ICD-10-CM | POA: Diagnosis not present

## 2022-01-02 DIAGNOSIS — M6281 Muscle weakness (generalized): Secondary | ICD-10-CM | POA: Diagnosis not present

## 2022-01-03 DIAGNOSIS — E1122 Type 2 diabetes mellitus with diabetic chronic kidney disease: Secondary | ICD-10-CM | POA: Diagnosis not present

## 2022-01-03 DIAGNOSIS — N1831 Chronic kidney disease, stage 3a: Secondary | ICD-10-CM | POA: Diagnosis not present

## 2022-01-03 DIAGNOSIS — E785 Hyperlipidemia, unspecified: Secondary | ICD-10-CM | POA: Diagnosis not present

## 2022-01-03 DIAGNOSIS — I4891 Unspecified atrial fibrillation: Secondary | ICD-10-CM | POA: Diagnosis not present

## 2022-01-03 DIAGNOSIS — I10 Essential (primary) hypertension: Secondary | ICD-10-CM | POA: Diagnosis not present

## 2022-01-06 DIAGNOSIS — M6281 Muscle weakness (generalized): Secondary | ICD-10-CM | POA: Diagnosis not present

## 2022-01-06 DIAGNOSIS — M25612 Stiffness of left shoulder, not elsewhere classified: Secondary | ICD-10-CM | POA: Diagnosis not present

## 2022-01-06 DIAGNOSIS — M25512 Pain in left shoulder: Secondary | ICD-10-CM | POA: Diagnosis not present

## 2022-01-13 ENCOUNTER — Encounter (INDEPENDENT_AMBULATORY_CARE_PROVIDER_SITE_OTHER): Payer: Self-pay | Admitting: Family Medicine

## 2022-01-13 NOTE — Progress Notes (Addendum)
?TeleHealth Visit:  ?Due to the COVID-19 pandemic, this visit was completed with telemedicine (audio/video) technology to reduce patient and provider exposure as well as to preserve personal protective equipment.  ? ?Melanie Reeves has verbally consented to this TeleHealth visit. The patient is located at home, the provider is located at home. The participants in this visit include the listed provider and patient. The visit was conducted today via MyChart video. ? ?OBESITY ?Melanie Reeves is here to discuss her progress with her obesity treatment plan along with follow-up of her obesity related diagnoses.  ? ?Today's visit was # 17 ?Starting weight: 294 lbs ?Starting date: 11/29/20 ?Total weight loss: 0 lbs at last in office visit. ?Weight at last in office visit: 299 lbs ?Today's reported weight: No weight reported. ? ?Nutrition Plan: the Category 2 Plan. 0% of time. ?Hunger is well controlled. Cravings are moderately controlled.  ?Current exercise: She reports that she is trying to move more overall. ? ?Interim History: Melanie Reeves's last visit was November 25, 2021.  Melanie Reeves reports she is not currently following the category 2 plan.  She comments that she knows she has not lost weight over the past year but she feels she is eating a lot healthier.  She tends to skip meals and snacks on simple carbs such as "puffed corn".  She denies intake of sugar sweetened beverages.  She is focusing on having protein at all meals. ?She says that she mostly eats due to cravings versus actual hunger. ? ?Assessment/Plan:  ?Type II Diabetes with other unspecified complications and long-term insulin use. ?HgbA1c is at goal.  Diabetes is managed by Dr. Dwyane Dee.  Notes from her visit with Dr. Dwyane Dee on November 28, 2021 were reviewed. ?CBGs: Checking sporadically. Run 122-180 ?Dexcom was ordered by Dr. Dwyane Dee but it was too expensive. ?Any episodes of hypoglycemia? no ?Medication(s): Toujeo 150 units daily, Invokana 300 mg daily, Mounjaro 12.5 mg  weekly, NovoLog 20 to 40 units 3 times daily. ?She says that her appetite is well controlled with Mounjaro 12.5 mg weekly.  She also feels she is snacking less. ?She often forgets to take her NovoLog since her meals are at irregular times. ? ?Lab Results  ?Component Value Date  ? HGBA1C 6.9 (H) 11/26/2021  ? HGBA1C 6.8 (H) 10/16/2021  ? HGBA1C 6.5 07/15/2021  ? ?Lab Results  ?Component Value Date  ? MICROALBUR <0.7 07/15/2021  ? Melanie Reeves 68 10/16/2021  ? CREATININE 0.97 11/26/2021  ? ? ?Plan: ?Discussed importance of taking all medications as prescribed by Dr. Dwyane Dee.  We talked about ways for her to be more consistent with it such as making a reminder note to leave in the kitchen. ?Continue all medications at current doses. ?Follow-up with Dr. Dwyane Dee as directed.  Next office visit is scheduled for May 16. ? ?2. OSA ?Melanie Reeves has a diagnosis of sleep apnea.  She received her CPAP the first week of February and she has been using it at least 4 hours per night.  She notes she feels better rested when using it. ? ?Plan: ?Continue CPAP therapy.  Work on using it the majority of the night. ?  ? ?3. Obesity: Current BMI 56.52 ?Melanie Reeves is currently in the action stage of change. As such, her goal is to continue with weight loss efforts. She has agreed to the Category 2 Plan.  ? ?Exercise goals: She will continue to try to be more active. ? ?Behavioral modification strategies: increasing lean protein intake, decreasing simple carbohydrates, and no skipping meals. ? ?Melanie Reeves  has agreed to follow-up with our clinic in 3 weeks.  ? ?No orders of the defined types were placed in this encounter. ? ? ?There are no discontinued medications.  ? ?No orders of the defined types were placed in this encounter. ?   ? ?Objective:  ? ?VITALS: Per patient if applicable, see vitals. ?GENERAL: Alert and in no acute distress. ?CARDIOPULMONARY: No increased WOB. Speaking in clear sentences.  ?PSYCH: Pleasant and cooperative. Speech normal rate and  rhythm. Affect is appropriate. Insight and judgement are appropriate. Attention is focused, linear, and appropriate.  ?NEURO: Oriented as arrived to appointment on time with no prompting.  ? ?Lab Results  ?Component Value Date  ? CREATININE 0.97 11/26/2021  ? BUN 35 (H) 11/26/2021  ? NA 141 11/26/2021  ? K 5.0 11/26/2021  ? CL 106 11/26/2021  ? CO2 27 11/26/2021  ? ?Lab Results  ?Component Value Date  ? ALT 27 10/16/2021  ? AST 32 10/16/2021  ? ALKPHOS 69 10/16/2021  ? BILITOT <0.2 10/16/2021  ? ?Lab Results  ?Component Value Date  ? HGBA1C 6.9 (H) 11/26/2021  ? HGBA1C 6.8 (H) 10/16/2021  ? HGBA1C 6.5 07/15/2021  ? HGBA1C 6.5 03/19/2021  ? HGBA1C 6.8 (H) 12/18/2020  ? ?Lab Results  ?Component Value Date  ? INSULIN 34.8 (H) 10/16/2021  ? ?Lab Results  ?Component Value Date  ? TSH 1.217 11/24/2020  ? ?Lab Results  ?Component Value Date  ? CHOL 152 10/16/2021  ? HDL 46 10/16/2021  ? [place] 66 10/16/2021  ? LDLDIRECT 72.0 02/28/2020  ? TRIG 247 (H) 10/16/2021  ? CHOLHDL 3 07/15/2021  ? ?Lab Results  ?Component Value Date  ? WBC 13.9 (H) 08/23/2021  ? HGB 13.1 08/23/2021  ? HCT 41.4 08/23/2021  ? MCV 86 08/23/2021  ? PLT 295 08/23/2021  ? ?Lab Results  ?Component Value Date  ? FERRITIN 264 10/25/2020  ? ?Lab Results  ?Component Value Date  ? VD25OH 43.1 10/16/2021  ? VD25OH 36.2 06/05/2021  ? VD25OH 19.7 (L) 11/29/2020  ? ? ?Attestation Statements:  ? ?Reviewed by clinician on day of visit: allergies, medications, problem list, medical history, surgical history, family history, social history, and previous encounter notes. ? ?Time spent on visit including pre-visit chart review and post-visit charting and care was 32 minutes.  ? ? ?

## 2022-01-14 ENCOUNTER — Telehealth (INDEPENDENT_AMBULATORY_CARE_PROVIDER_SITE_OTHER): Payer: PPO | Admitting: Family Medicine

## 2022-01-14 ENCOUNTER — Encounter (INDEPENDENT_AMBULATORY_CARE_PROVIDER_SITE_OTHER): Payer: Self-pay | Admitting: Family Medicine

## 2022-01-14 DIAGNOSIS — G4733 Obstructive sleep apnea (adult) (pediatric): Secondary | ICD-10-CM | POA: Diagnosis not present

## 2022-01-14 DIAGNOSIS — E669 Obesity, unspecified: Secondary | ICD-10-CM | POA: Diagnosis not present

## 2022-01-14 DIAGNOSIS — Z6841 Body Mass Index (BMI) 40.0 and over, adult: Secondary | ICD-10-CM | POA: Diagnosis not present

## 2022-01-14 DIAGNOSIS — Z794 Long term (current) use of insulin: Secondary | ICD-10-CM

## 2022-01-14 DIAGNOSIS — E1169 Type 2 diabetes mellitus with other specified complication: Secondary | ICD-10-CM | POA: Diagnosis not present

## 2022-01-20 ENCOUNTER — Ambulatory Visit (INDEPENDENT_AMBULATORY_CARE_PROVIDER_SITE_OTHER): Payer: PPO | Admitting: Family Medicine

## 2022-01-20 DIAGNOSIS — M7502 Adhesive capsulitis of left shoulder: Secondary | ICD-10-CM | POA: Diagnosis not present

## 2022-01-20 DIAGNOSIS — M6281 Muscle weakness (generalized): Secondary | ICD-10-CM | POA: Diagnosis not present

## 2022-01-20 DIAGNOSIS — M25612 Stiffness of left shoulder, not elsewhere classified: Secondary | ICD-10-CM | POA: Diagnosis not present

## 2022-01-20 DIAGNOSIS — M25512 Pain in left shoulder: Secondary | ICD-10-CM | POA: Diagnosis not present

## 2022-01-22 DIAGNOSIS — G4733 Obstructive sleep apnea (adult) (pediatric): Secondary | ICD-10-CM | POA: Diagnosis not present

## 2022-01-22 DIAGNOSIS — M25512 Pain in left shoulder: Secondary | ICD-10-CM | POA: Diagnosis not present

## 2022-01-22 DIAGNOSIS — I872 Venous insufficiency (chronic) (peripheral): Secondary | ICD-10-CM | POA: Diagnosis not present

## 2022-01-22 DIAGNOSIS — M6281 Muscle weakness (generalized): Secondary | ICD-10-CM | POA: Diagnosis not present

## 2022-01-22 DIAGNOSIS — I4891 Unspecified atrial fibrillation: Secondary | ICD-10-CM | POA: Diagnosis not present

## 2022-01-22 DIAGNOSIS — M25612 Stiffness of left shoulder, not elsewhere classified: Secondary | ICD-10-CM | POA: Diagnosis not present

## 2022-01-26 DIAGNOSIS — G4733 Obstructive sleep apnea (adult) (pediatric): Secondary | ICD-10-CM | POA: Diagnosis not present

## 2022-01-27 DIAGNOSIS — M25512 Pain in left shoulder: Secondary | ICD-10-CM | POA: Diagnosis not present

## 2022-01-27 DIAGNOSIS — M25612 Stiffness of left shoulder, not elsewhere classified: Secondary | ICD-10-CM | POA: Diagnosis not present

## 2022-01-27 DIAGNOSIS — M6281 Muscle weakness (generalized): Secondary | ICD-10-CM | POA: Diagnosis not present

## 2022-01-30 ENCOUNTER — Other Ambulatory Visit (INDEPENDENT_AMBULATORY_CARE_PROVIDER_SITE_OTHER): Payer: Self-pay | Admitting: Family Medicine

## 2022-01-30 ENCOUNTER — Encounter (INDEPENDENT_AMBULATORY_CARE_PROVIDER_SITE_OTHER): Payer: Self-pay | Admitting: Family Medicine

## 2022-01-30 ENCOUNTER — Other Ambulatory Visit (HOSPITAL_COMMUNITY): Payer: Self-pay

## 2022-01-30 DIAGNOSIS — E1169 Type 2 diabetes mellitus with other specified complication: Secondary | ICD-10-CM

## 2022-01-30 DIAGNOSIS — E559 Vitamin D deficiency, unspecified: Secondary | ICD-10-CM

## 2022-01-30 MED ORDER — VITAMIN D (ERGOCALCIFEROL) 1.25 MG (50000 UNIT) PO CAPS
50000.0000 [IU] | ORAL_CAPSULE | ORAL | 0 refills | Status: DC
  Filled 2022-01-30: qty 4, 28d supply, fill #0

## 2022-01-30 MED ORDER — MOUNJARO 12.5 MG/0.5ML ~~LOC~~ SOAJ
12.5000 mg | SUBCUTANEOUS | 0 refills | Status: DC
  Filled 2022-01-30: qty 6, 84d supply, fill #0

## 2022-01-30 NOTE — Telephone Encounter (Signed)
LAST APPOINTMENT DATE: 01/14/22 ?NEXT APPOINTMENT DATE: 02/04/22 ? ? ?[id number] - [place], [place] - [address] ?[address] [place] [address] ?Phone: [phone] Fax: [phone] ? ?[facility] (New Address) - [place], [place] AT Previously: [place], [place] [address] ?[address] ?[place] [address] ?Phone: [phone] Fax: [phone] ? ?[facility] ?[address] ?[place] [address] ?Phone: [phone] Fax: [phone] ? ?Patient is requesting a refill of the following medications: ?Pending Prescriptions:                       Disp   Refills ?  tirzepatide (MOUNJARO) 12.5 MG/0.5ML Pen   6 mL   0       ?Sig: Inject 12.5 mg into the skin once a week. ? ? ?Date last filled: 11/06/21 ?Previously prescribed by dR.bEASLEY ? ?Lab Results ?     Component                Value               Date                 ?     HGBA1C                   6.9 (H)             11/26/2021           ?     HGBA1C                   6.8 (H)             10/16/2021           ?     HGBA1C                   6.5                 07/15/2021           ?Lab Results ?     Component                Value               Date                 ?     MICROALBUR               <0.7                07/15/2021           ?     [place]                  66                  10/16/2021           ?     CREATININE               0.97                11/26/2021           ?Lab Results ?     Component  Value               Date                 ?     VD25OH                   43.1                10/16/2021           ?     VD25OH                   36.2                06/05/2021           ?     VD25OH                   19.7 (L)            11/29/2020           ? ?BP Readings from Last 3 Encounters: ?11/28/21 : 140/72 ?11/25/21 : 136/83 ?11/06/21 : 139/81 ?

## 2022-01-30 NOTE — Telephone Encounter (Signed)
LAST APPOINTMENT DATE: 01/14/22 ?NEXT APPOINTMENT DATE: 02/04/22 ? ? ?[id number] [place] - [address] ?[address] ?[place] [address] ?Phone: [phone] Fax: [phone] ? ?[facility] (New Address) - [place], [place] - [address] AT Previously: [address], [place] ?[address] ?[address] ?[place] [address] ?Phone: [phone] Fax: [phone] ? ?[facility] ?[address] ?[place] [address] ?Phone: [phone] Fax: [phone] ? ?Patient is requesting a refill of the following medications: ?Pending Prescriptions:                       Disp   Refills ?  Vitamin D, Ergocalciferol, (DRISDOL) 1.25 *4 caps*0       ?Sig: Take 1 capsule (50,000 Units total) by mouth every 7 ?         (seven) days. Saturday ? ? ?Date last filled: 11/25/21 ?Previously prescribed by dR.bEASLEY ? ?Lab Results ?     Component                Value               Date                 ?     HGBA1C                   6.9 (H)             11/26/2021           ?     HGBA1C                   6.8 (H)             10/16/2021           ?     HGBA1C                   6.5                 07/15/2021           ?Lab Results ?     Component                Value               Date                 ?     MICROALBUR               <0.7                07/15/2021           ?     LDLCALC                  66                  10/16/2021           ?     CREATININE               0.97                11/26/2021           ?Lab Results ?     Component  Value               Date                 ?     VD25OH                   43.1                10/16/2021           ?     VD25OH                   36.2                06/05/2021           ?     VD25OH                   19.7 (L)            11/29/2020           ? ?BP Readings from Last 3 Encounters: ?11/28/21 : 140/72 ?11/25/21 :  136/83 ?11/06/21 : 139/81 ?

## 2022-01-31 ENCOUNTER — Other Ambulatory Visit (HOSPITAL_COMMUNITY): Payer: Self-pay

## 2022-01-31 DIAGNOSIS — M25512 Pain in left shoulder: Secondary | ICD-10-CM | POA: Diagnosis not present

## 2022-01-31 DIAGNOSIS — M6281 Muscle weakness (generalized): Secondary | ICD-10-CM | POA: Diagnosis not present

## 2022-01-31 DIAGNOSIS — M25612 Stiffness of left shoulder, not elsewhere classified: Secondary | ICD-10-CM | POA: Diagnosis not present

## 2022-02-03 DIAGNOSIS — E1165 Type 2 diabetes mellitus with hyperglycemia: Secondary | ICD-10-CM | POA: Diagnosis not present

## 2022-02-03 DIAGNOSIS — H25813 Combined forms of age-related cataract, bilateral: Secondary | ICD-10-CM | POA: Diagnosis not present

## 2022-02-03 DIAGNOSIS — H40023 Open angle with borderline findings, high risk, bilateral: Secondary | ICD-10-CM | POA: Diagnosis not present

## 2022-02-03 DIAGNOSIS — H31013 Macula scars of posterior pole (postinflammatory) (post-traumatic), bilateral: Secondary | ICD-10-CM | POA: Diagnosis not present

## 2022-02-04 ENCOUNTER — Ambulatory Visit (INDEPENDENT_AMBULATORY_CARE_PROVIDER_SITE_OTHER): Payer: PPO | Admitting: Family Medicine

## 2022-02-04 ENCOUNTER — Other Ambulatory Visit: Payer: Self-pay | Admitting: Endocrinology

## 2022-02-04 ENCOUNTER — Encounter (INDEPENDENT_AMBULATORY_CARE_PROVIDER_SITE_OTHER): Payer: Self-pay | Admitting: Family Medicine

## 2022-02-04 VITALS — BP 146/79 | HR 88 | Temp 97.3°F | Ht 61.0 in | Wt 292.0 lb

## 2022-02-04 DIAGNOSIS — E669 Obesity, unspecified: Secondary | ICD-10-CM | POA: Diagnosis not present

## 2022-02-04 DIAGNOSIS — Z794 Long term (current) use of insulin: Secondary | ICD-10-CM

## 2022-02-04 DIAGNOSIS — Z6841 Body Mass Index (BMI) 40.0 and over, adult: Secondary | ICD-10-CM | POA: Diagnosis not present

## 2022-02-04 DIAGNOSIS — E1169 Type 2 diabetes mellitus with other specified complication: Secondary | ICD-10-CM | POA: Diagnosis not present

## 2022-02-04 DIAGNOSIS — E559 Vitamin D deficiency, unspecified: Secondary | ICD-10-CM

## 2022-02-04 MED ORDER — VITAMIN D (ERGOCALCIFEROL) 1.25 MG (50000 UNIT) PO CAPS: 50000.0000 [IU] | ORAL_CAPSULE | ORAL | 0 refills | Status: DC

## 2022-02-07 DIAGNOSIS — M25512 Pain in left shoulder: Secondary | ICD-10-CM | POA: Diagnosis not present

## 2022-02-07 DIAGNOSIS — M6281 Muscle weakness (generalized): Secondary | ICD-10-CM | POA: Diagnosis not present

## 2022-02-07 DIAGNOSIS — M25612 Stiffness of left shoulder, not elsewhere classified: Secondary | ICD-10-CM | POA: Diagnosis not present

## 2022-02-11 DIAGNOSIS — M25512 Pain in left shoulder: Secondary | ICD-10-CM | POA: Diagnosis not present

## 2022-02-11 DIAGNOSIS — M25612 Stiffness of left shoulder, not elsewhere classified: Secondary | ICD-10-CM | POA: Diagnosis not present

## 2022-02-11 DIAGNOSIS — M6281 Muscle weakness (generalized): Secondary | ICD-10-CM | POA: Diagnosis not present

## 2022-02-17 NOTE — Progress Notes (Signed)
? ? ? ?Chief Complaint:  ? ?OBESITY ?Melanie Reeves is here to discuss her progress with her obesity treatment plan along with follow-up of her obesity related diagnoses. Melanie Reeves is on the Category 2 Plan and states she is following her eating plan approximately 40% of the time. Melanie Reeves states she has been walking some.  ? ?Today's visit was #: 18 ?Starting weight: 294 lbs ?Starting date: 11/29/2020 ?Today's weight: 292 lbs ?Today's date: 02/04/2022 ?Total lbs lost to date: 2 ?Total lbs lost since last in-office visit: 7 ? ?Interim History: Melanie Reeves continues to do well with weight loss. She still struggles with some stress eating but less than she used to. ? ?Subjective:  ? ?1. Vitamin D deficiency ?Melanie Reeves is on Vitamin D, but her level is not yet at goal. She requests a refill today.  ? ?2. Type 2 diabetes mellitus with other specified complication, with long-term current use of insulin (HCC) ?Melanie Reeves's recent A1c was at 6.9. She continues to work on her diet and weight loss. Her fasting BS ranges between 120-140 but she is sometimes forgetting to take her insulin. She is denies hypoglycemia.  ? ?Assessment/Plan:  ? ?1. Vitamin D deficiency ?We will refill prescription Vitamin D for 1 month. We will recheck labs and Melanie Reeves will follow-up for routine testing of Vitamin D, at least 2-3 times per year to avoid over-replacement. ? ?- Vitamin D, Ergocalciferol, (DRISDOL) 1.25 MG (50000 UNIT) CAPS capsule; Take 1 capsule by mouth every 7 days.  Dispense: 4 capsule; Refill: 0 ? ?2. Type 2 diabetes mellitus with other specified complication, with long-term current use of insulin (HCC) ?Melanie Reeves will continue [facility] and her other medications. She will continue with her diet and exercise.  ? ?3. Obesity with current BMI of 55.3 ?Melanie Reeves is currently in the action stage of change. As such, her goal is to continue with weight loss efforts. She has agreed to the Category 2 Plan.  ? ?Exercise goals: As is.  ? ?Behavioral modification strategies:  increasing lean protein intake and no skipping meals. ? ?Melanie Reeves has agreed to follow-up with our clinic in 4 weeks. She was informed of the importance of frequent follow-up visits to maximize her success with intensive lifestyle modifications for her multiple health conditions.  ? ?Objective:  ? ?Blood pressure (!) 146/79, pulse 88, temperature (!) 97.3 ?F (36.3 ?C), height 5\' 1"  (1.549 m), weight 292 lb (132.5 kg), SpO2 96 %. ?Body mass index is 55.17 kg/m?. ? ?General: Cooperative, alert, well developed, in no acute distress. ?HEENT: Conjunctivae and lids unremarkable. ?Cardiovascular: Regular rhythm.  ?Lungs: Normal work of breathing. ?Neurologic: No focal deficits.  ? ?Lab Results  ?Component Value Date  ? CREATININE 0.97 11/26/2021  ? BUN 35 (H) 11/26/2021  ? NA 141 11/26/2021  ? K 5.0 11/26/2021  ? CL 106 11/26/2021  ? CO2 27 11/26/2021  ? ?Lab Results  ?Component Value Date  ? ALT 27 10/16/2021  ? AST 32 10/16/2021  ? ALKPHOS 69 10/16/2021  ? BILITOT <0.2 10/16/2021  ? ?Lab Results  ?Component Value Date  ? HGBA1C 6.9 (H) 11/26/2021  ? HGBA1C 6.8 (H) 10/16/2021  ? HGBA1C 6.5 07/15/2021  ? HGBA1C 6.5 03/19/2021  ? HGBA1C 6.8 (H) 12/18/2020  ? ?Lab Results  ?Component Value Date  ? INSULIN 34.8 (H) 10/16/2021  ? ?Lab Results  ?Component Value Date  ? TSH 1.217 11/24/2020  ? ?Lab Results  ?Component Value Date  ? CHOL 152 10/16/2021  ? HDL 46 10/16/2021  ? LDLCALC  66 10/16/2021  ? LDLDIRECT 72.0 02/28/2020  ? TRIG 247 (H) 10/16/2021  ? CHOLHDL 3 07/15/2021  ? ?Lab Results  ?Component Value Date  ? VD25OH 43.1 10/16/2021  ? VD25OH 36.2 06/05/2021  ? VD25OH 19.7 (L) 11/29/2020  ? ?Lab Results  ?Component Value Date  ? WBC 13.9 (H) 08/23/2021  ? HGB 13.1 08/23/2021  ? HCT 41.4 08/23/2021  ? MCV 86 08/23/2021  ? PLT 295 08/23/2021  ? ?Lab Results  ?Component Value Date  ? FERRITIN 264 10/25/2020  ? ? ?Obesity Behavioral Intervention:  ? ?Approximately 15 minutes were spent on the discussion below. ? ?ASK: ?We  discussed the diagnosis of obesity with Melanie Reeves today and Melanie Reeves agreed to give [place] permission to discuss obesity behavioral modification therapy today. ? ?ASSESS: ?Melanie Reeves has the diagnosis of obesity and her BMI today is 55.3. Melanie Reeves is in the action stage of change.  ? ?ADVISE: ?Melanie Reeves was educated on the multiple health risks of obesity as well as the benefit of weight loss to improve her health. She was advised of the need for long term treatment and the importance of lifestyle modifications to improve her current health and to decrease her risk of future health problems. ? ?AGREE: ?Multiple dietary modification options and treatment options were discussed and Melanie Reeves agreed to follow the recommendations documented in the above note. ? ?ARRANGE: ?Melanie Reeves was educated on the importance of frequent visits to treat obesity as outlined per CMS and USPSTF guidelines and agreed to schedule her next follow up appointment today. ? ?Attestation Statements:  ? ?Reviewed by clinician on day of visit: allergies, medications, problem list, medical history, surgical history, family history, social history, and previous encounter notes. ? ? ?I, Trixie Dredge, am acting as transcriptionist for Dennard Nip, MD. ? ?I have reviewed the above documentation for accuracy and completeness, and I agree with the above. -  Dennard Nip, MD ? ? ?

## 2022-02-18 DIAGNOSIS — M25512 Pain in left shoulder: Secondary | ICD-10-CM | POA: Diagnosis not present

## 2022-02-18 DIAGNOSIS — M6281 Muscle weakness (generalized): Secondary | ICD-10-CM | POA: Diagnosis not present

## 2022-02-18 DIAGNOSIS — M25612 Stiffness of left shoulder, not elsewhere classified: Secondary | ICD-10-CM | POA: Diagnosis not present

## 2022-02-18 DIAGNOSIS — G4733 Obstructive sleep apnea (adult) (pediatric): Secondary | ICD-10-CM | POA: Diagnosis not present

## 2022-02-21 ENCOUNTER — Other Ambulatory Visit: Payer: PPO

## 2022-02-21 DIAGNOSIS — J40 Bronchitis, not specified as acute or chronic: Secondary | ICD-10-CM | POA: Diagnosis not present

## 2022-02-24 NOTE — Progress Notes (Signed)
?TeleHealth Visit:  ?This visit was completed with telemedicine (audio/video) technology. ?Melanie Reeves has verbally consented to this TeleHealth visit. The patient is located at home, the provider is located at home. The participants in this visit include the listed provider and patient. The visit was conducted today via MyChart video. ? ?OBESITY ?Melanie Reeves is here to discuss her progress with her obesity treatment plan along with follow-up of her obesity related diagnoses.  ? ?Today's visit was # 19 ?Starting weight: 294 lbs ?Starting date: 11/29/2020 ?Weight at last in office visit: 292 lbs on 02/04/22 ?Total weight loss: 2 lbs at last in office visit on 02/04/22. ?Today's reported weight: No weight reported. ? ?Nutrition Plan: the Category 2 Plan.  ?Hunger is well controlled. Cravings are poorly controlled.  ?Current exercise: none ? ?Interim History: Melanie Reeves has been visiting her daughter in [place] and has been off plan.  She is focusing on protein and generally has a meat and a vegetable for dinner.  She generally has 2 meals per day. ?She feels her biggest issue is eating when she is not hungry. ?She denies intake of caloric beverages and drinks mostly Crystal light. ? ?Assessment/Plan:  ?1. Type II Diabetes ?HgbA1c is at goal. Sees Dr. Lucianne Muss next week. ?CBGs: Checks sporadically. 141 at last check. ?Episodes of hypoglycemia? no ?Medication(s): very sporadic with taking Novolog due to irregular meals. ?Also on Mounjaro 12.5 mg weekly Toujeo 140 units daily, metformin XR 1000 twice daily, Invokana 300 mg daily.  She is compliant with all of these. ? ?Lab Results  ?Component Value Date  ? HGBA1C 6.9 (H) 11/26/2021  ? HGBA1C 6.8 (H) 10/16/2021  ? HGBA1C 6.5 07/15/2021  ? ?Lab Results  ?Component Value Date  ? MICROALBUR <0.7 07/15/2021  ? LDLCALC 66 10/16/2021  ? CREATININE 0.97 11/26/2021  ? ? ?Plan: ?Check CBGs fasting and 2 hour after dinner daily and take log to Dr. Lucianne Muss next week. ?After that check fasting  and 2-hour postprandial 3-4 times per week, record, and bring log in to next OV. ?Continue all medications at current doses. ?Discussed that she should put a few notes in her kitchen to remind her to take NovoLog when she eats. ? ?2. Vitamin D Deficiency ?Vitamin D is not at goal of 50. She is on weekly prescription Vitamin D 50,000 IU.  ?Lab Results  ?Component Value Date  ? VD25OH 43.1 10/16/2021  ? VD25OH 36.2 06/05/2021  ? VD25OH 19.7 (L) 11/29/2020  ? ? ?Plan: ?Continue prescription vitamin D 50,000 IU weekly. ? ?3. Obesity: Current BMI 55.2 ?Melanie Reeves is currently in the action stage of change. As such, her goal is to continue with weight loss efforts.  ?She has agreed to the Category 2 Plan.  ? ?Exercise goals: No exercise has been prescribed at this time. ? ?Behavioral modification strategies: increasing lean protein intake, decreasing simple carbohydrates, no skipping meals, better snacking choices, and planning for success. ?Handouts via MyChart: Protein equivalents. ?Advised her to have 2 choices off of the protein equivalents list if she skips lunch. ? ?[name] has agreed to follow-up with our clinic in 4 weeks.  ? ?No orders of the defined types were placed in this encounter. ? ? ?There are no discontinued medications.  ? ?No orders of the defined types were placed in this encounter. ?   ? ?Objective:  ? ?VITALS: Per patient if applicable, see vitals. ?GENERAL: Alert and in no acute distress. ?CARDIOPULMONARY: No increased WOB. Speaking in clear sentences.  ?PSYCH: Pleasant and cooperative. Speech  normal rate and rhythm. Affect is appropriate. Insight and judgement are appropriate. Attention is focused, linear, and appropriate.  ?NEURO: Oriented as arrived to appointment on time with no prompting.  ? ?Lab Results  ?Component Value Date  ? CREATININE 0.97 11/26/2021  ? BUN 35 (H) 11/26/2021  ? NA 141 11/26/2021  ? K 5.0 11/26/2021  ? CL 106 11/26/2021  ? CO2 27 11/26/2021  ? ?Lab Results  ?Component Value Date   ? ALT 27 10/16/2021  ? AST 32 10/16/2021  ? ALKPHOS 69 10/16/2021  ? BILITOT <0.2 10/16/2021  ? ?Lab Results  ?Component Value Date  ? HGBA1C 6.9 (H) 11/26/2021  ? HGBA1C 6.8 (H) 10/16/2021  ? HGBA1C 6.5 07/15/2021  ? HGBA1C 6.5 03/19/2021  ? HGBA1C 6.8 (H) 12/18/2020  ? ?Lab Results  ?Component Value Date  ? INSULIN 34.8 (H) 10/16/2021  ? ?Lab Results  ?Component Value Date  ? TSH 1.217 11/24/2020  ? ?Lab Results  ?Component Value Date  ? CHOL 152 10/16/2021  ? HDL 46 10/16/2021  ? [place] 66 10/16/2021  ? LDLDIRECT 72.0 02/28/2020  ? TRIG 247 (H) 10/16/2021  ? CHOLHDL 3 07/15/2021  ? ?Lab Results  ?Component Value Date  ? WBC 13.9 (H) 08/23/2021  ? HGB 13.1 08/23/2021  ? HCT 41.4 08/23/2021  ? MCV 86 08/23/2021  ? PLT 295 08/23/2021  ? ?Lab Results  ?Component Value Date  ? FERRITIN 264 10/25/2020  ? ?Lab Results  ?Component Value Date  ? VD25OH 43.1 10/16/2021  ? VD25OH 36.2 06/05/2021  ? VD25OH 19.7 (L) 11/29/2020  ? ? ?Attestation Statements:  ? ?Reviewed by clinician on day of visit: allergies, medications, problem list, medical history, surgical history, family history, social history, and previous encounter notes. ? ?Time spent on visit including pre-visit chart review and post-visit charting and care was 32 minutes.  ? ? ?

## 2022-02-25 ENCOUNTER — Encounter (INDEPENDENT_AMBULATORY_CARE_PROVIDER_SITE_OTHER): Payer: Self-pay | Admitting: Family Medicine

## 2022-02-25 ENCOUNTER — Ambulatory Visit: Payer: PPO | Admitting: Endocrinology

## 2022-02-25 ENCOUNTER — Telehealth (INDEPENDENT_AMBULATORY_CARE_PROVIDER_SITE_OTHER): Payer: PPO | Admitting: Family Medicine

## 2022-02-25 ENCOUNTER — Other Ambulatory Visit (INDEPENDENT_AMBULATORY_CARE_PROVIDER_SITE_OTHER): Payer: Self-pay | Admitting: Family Medicine

## 2022-02-25 DIAGNOSIS — Z794 Long term (current) use of insulin: Secondary | ICD-10-CM | POA: Diagnosis not present

## 2022-02-25 DIAGNOSIS — E559 Vitamin D deficiency, unspecified: Secondary | ICD-10-CM

## 2022-02-25 DIAGNOSIS — Z6841 Body Mass Index (BMI) 40.0 and over, adult: Secondary | ICD-10-CM

## 2022-02-25 DIAGNOSIS — E1169 Type 2 diabetes mellitus with other specified complication: Secondary | ICD-10-CM

## 2022-02-25 DIAGNOSIS — E669 Obesity, unspecified: Secondary | ICD-10-CM | POA: Diagnosis not present

## 2022-03-04 ENCOUNTER — Other Ambulatory Visit: Payer: Self-pay | Admitting: Endocrinology

## 2022-03-05 ENCOUNTER — Other Ambulatory Visit: Payer: Self-pay | Admitting: Endocrinology

## 2022-03-05 ENCOUNTER — Telehealth: Payer: Self-pay

## 2022-03-05 DIAGNOSIS — M7502 Adhesive capsulitis of left shoulder: Secondary | ICD-10-CM | POA: Diagnosis not present

## 2022-03-05 MED ORDER — PREGABALIN 100 MG PO CAPS
100.0000 mg | ORAL_CAPSULE | Freq: Two times a day (BID) | ORAL | 5 refills | Status: DC
Start: 2022-03-05 — End: 2022-04-01

## 2022-03-05 NOTE — Telephone Encounter (Signed)
Patient needs more pregablin sent in. Please send Rx

## 2022-03-06 DIAGNOSIS — H25813 Combined forms of age-related cataract, bilateral: Secondary | ICD-10-CM | POA: Diagnosis not present

## 2022-03-06 DIAGNOSIS — H25811 Combined forms of age-related cataract, right eye: Secondary | ICD-10-CM | POA: Diagnosis not present

## 2022-03-11 ENCOUNTER — Other Ambulatory Visit (INDEPENDENT_AMBULATORY_CARE_PROVIDER_SITE_OTHER): Payer: PPO

## 2022-03-11 DIAGNOSIS — E1165 Type 2 diabetes mellitus with hyperglycemia: Secondary | ICD-10-CM

## 2022-03-11 DIAGNOSIS — Z794 Long term (current) use of insulin: Secondary | ICD-10-CM

## 2022-03-11 LAB — BASIC METABOLIC PANEL
BUN: 40 mg/dL — ABNORMAL HIGH (ref 6–23)
CO2: 26 mEq/L (ref 19–32)
Calcium: 10.1 mg/dL (ref 8.4–10.5)
Chloride: 104 mEq/L (ref 96–112)
Creatinine, Ser: 0.99 mg/dL (ref 0.40–1.20)
GFR: 58.46 mL/min — ABNORMAL LOW (ref >60.00)
Glucose, Bld: 173 mg/dL — ABNORMAL HIGH (ref 70–99)
Potassium: 4.5 mEq/L (ref 3.5–5.1)
Sodium: 140 mEq/L (ref 135–145)

## 2022-03-11 LAB — HEMOGLOBIN A1C: Hgb A1c MFr Bld: 7.2 % — ABNORMAL HIGH (ref 4.6–6.5)

## 2022-03-12 ENCOUNTER — Telehealth: Payer: Self-pay

## 2022-03-12 NOTE — Telephone Encounter (Signed)
Please disregard previous message. Wrong patient

## 2022-03-12 NOTE — Telephone Encounter (Signed)
Contacted patient to let her know that I received application but one page is missing her signature. She requested I mail it back to her and she will mail it back. Sent it out today

## 2022-03-14 ENCOUNTER — Encounter: Payer: PPO | Admitting: Endocrinology

## 2022-03-14 ENCOUNTER — Encounter: Payer: Self-pay | Admitting: Endocrinology

## 2022-03-14 MED ORDER — DEXCOM G6 RECEIVER DEVI: 0 refills | Status: DC

## 2022-03-14 MED ORDER — DEXCOM G6 SENSOR MISC: 3 refills | Status: DC

## 2022-03-14 NOTE — Progress Notes (Unsigned)
This encounter was created in error - please disregard.

## 2022-03-17 ENCOUNTER — Other Ambulatory Visit: Payer: Self-pay

## 2022-03-17 DIAGNOSIS — E1165 Type 2 diabetes mellitus with hyperglycemia: Secondary | ICD-10-CM

## 2022-03-17 MED ORDER — DEXCOM G6 TRANSMITTER MISC: 2 refills | Status: DC

## 2022-03-18 DIAGNOSIS — H269 Unspecified cataract: Secondary | ICD-10-CM | POA: Diagnosis not present

## 2022-03-18 DIAGNOSIS — H25811 Combined forms of age-related cataract, right eye: Secondary | ICD-10-CM | POA: Diagnosis not present

## 2022-03-24 DIAGNOSIS — H25812 Combined forms of age-related cataract, left eye: Secondary | ICD-10-CM | POA: Diagnosis not present

## 2022-03-25 ENCOUNTER — Ambulatory Visit (INDEPENDENT_AMBULATORY_CARE_PROVIDER_SITE_OTHER): Payer: PPO | Admitting: Family Medicine

## 2022-03-25 ENCOUNTER — Other Ambulatory Visit: Payer: Self-pay | Admitting: Endocrinology

## 2022-03-25 ENCOUNTER — Encounter (INDEPENDENT_AMBULATORY_CARE_PROVIDER_SITE_OTHER): Payer: Self-pay | Admitting: Family Medicine

## 2022-03-25 VITALS — BP 151/67 | HR 101 | Temp 97.9°F | Ht 61.0 in | Wt 293.0 lb

## 2022-03-25 DIAGNOSIS — E559 Vitamin D deficiency, unspecified: Secondary | ICD-10-CM | POA: Diagnosis not present

## 2022-03-25 DIAGNOSIS — Z7985 Long-term (current) use of injectable non-insulin antidiabetic drugs: Secondary | ICD-10-CM

## 2022-03-25 DIAGNOSIS — Z6841 Body Mass Index (BMI) 40.0 and over, adult: Secondary | ICD-10-CM

## 2022-03-25 DIAGNOSIS — E1169 Type 2 diabetes mellitus with other specified complication: Secondary | ICD-10-CM

## 2022-03-25 DIAGNOSIS — F32A Depression, unspecified: Secondary | ICD-10-CM | POA: Insufficient documentation

## 2022-03-25 DIAGNOSIS — E669 Obesity, unspecified: Secondary | ICD-10-CM | POA: Diagnosis not present

## 2022-03-25 DIAGNOSIS — F3289 Other specified depressive episodes: Secondary | ICD-10-CM

## 2022-03-25 MED ORDER — VITAMIN D (ERGOCALCIFEROL) 1.25 MG (50000 UNIT) PO CAPS: 50000.0000 [IU] | ORAL_CAPSULE | ORAL | 0 refills | Status: DC

## 2022-03-25 MED ORDER — MOUNJARO 12.5 MG/0.5ML ~~LOC~~ SOAJ
12.5000 mg | SUBCUTANEOUS | 0 refills | Status: DC
Start: 2022-03-25 — End: 2022-04-21
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
## 2022-03-26 NOTE — Progress Notes (Signed)
Chief Complaint:   OBESITY Melanie Reeves is here to discuss her progress with her obesity treatment plan along with follow-up of her obesity related diagnoses. Melanie Reeves is on the Category 2 Plan and states she is following her eating plan approximately 20% of the time. Melanie Reeves states she is walking 2 times per week.    Today's visit was #: 20 Starting weight: 294 lbs Starting date: 11/29/2020 Today's weight: 293 lbs Today's date: 03/25/2022 Total lbs lost to date: 1 Total lbs lost since last in-office visit: 0  Interim History: Melanie Reeves has been trying to portion control and make smarter choices.  She states she is not eating as much but her RMR may be decreasing.  She has not been eating enough protein especially.  Subjective:   1. Type 2 diabetes mellitus with other specified complication, with long-term current use of insulin (HCC) Melanie Reeves is working on decreasing her simple carbohydrates.  She is stable on Mounjaro.  2. Vitamin D deficiency Melanie Reeves's last vitamin D level was not yet at goal.  3. Other depression, with emotional eating behaviors Melanie Reeves notes increased snacking at night despite decreased hunger.  She feels this is delaying her weight loss.  Assessment/Plan:   1. Type 2 diabetes mellitus with other specified complication, with long-term current use of insulin (HCC) Melanie Reeves will continue Mounjaro 12.5 mg weekly, and we will refill for 1 month.  - tirzepatide (MOUNJARO) 12.5 MG/0.5ML Pen; Inject 12.5 mg into the skin once a week.  Dispense: 2 mL; Refill: 0  2. Vitamin D deficiency Melanie Reeves will continue prescription vitamin D 50,000 units weekly, and we will refill for 1 month.  - Vitamin D, Ergocalciferol, (DRISDOL) 1.25 MG (50000 UNIT) CAPS capsule; Take 1 capsule by mouth every 7 days.  Dispense: 4 capsule; Refill: 0  3. Other depression, with emotional eating behaviors Patient was referred to Dr. [name] Mussel, our Bariatric Psychologist, for evaluation due to significant struggles  with emotional eating.  4. Obesity, Current BMI 55.5 Melanie Reeves is currently in the action stage of change. As such, her goal is to continue with weight loss efforts. She has agreed to change to strictly keeping a food journal and adhering to recommended goals of 1600 calories and 100+ grams of protein daily.   Exercise goals: As is.  Behavioral modification strategies: increasing lean protein intake, meal planning and cooking strategies, and keeping a strict food journal.  Melanie Reeves has agreed to follow-up with our clinic in 4 weeks. She was informed of the importance of frequent follow-up visits to maximize her success with intensive lifestyle modifications for her multiple health conditions.   Objective:   Blood pressure (!) 151/67, pulse (!) 101, temperature 97.9 F (36.6 C), height 5\' 1"  (1.549 m), weight 293 lb (132.9 kg), SpO2 96 %. Body mass index is 55.36 kg/m.  General: Cooperative, alert, well developed, in no acute distress. HEENT: Conjunctivae and lids unremarkable. Cardiovascular: Regular rhythm.  Lungs: Normal work of breathing. Neurologic: No focal deficits.   Lab Results  Component Value Date   CREATININE 0.99 03/11/2022   BUN 40 (H) 03/11/2022   NA 140 03/11/2022   K 4.5 03/11/2022   CL 104 03/11/2022   CO2 26 03/11/2022   Lab Results  Component Value Date   ALT 27 10/16/2021   AST 32 10/16/2021   ALKPHOS 69 10/16/2021   BILITOT <0.2 10/16/2021   Lab Results  Component Value Date   HGBA1C 7.2 (H) 03/11/2022   HGBA1C 6.9 (H) 11/26/2021  HGBA1C 6.8 (H) 10/16/2021   HGBA1C 6.5 07/15/2021   HGBA1C 6.5 03/19/2021   Lab Results  Component Value Date   INSULIN 34.8 (H) 10/16/2021   Lab Results  Component Value Date   TSH 1.217 11/24/2020   Lab Results  Component Value Date   CHOL 152 10/16/2021   HDL 46 10/16/2021   LDLCALC 66 10/16/2021   LDLDIRECT 72.0 02/28/2020   TRIG 247 (H) 10/16/2021   CHOLHDL 3 07/15/2021   Lab Results  Component Value Date    VD25OH 43.1 10/16/2021   VD25OH 36.2 06/05/2021   VD25OH 19.7 (L) 11/29/2020   Lab Results  Component Value Date   WBC 13.9 (H) 08/23/2021   HGB 13.1 08/23/2021   HCT 41.4 08/23/2021   MCV 86 08/23/2021   PLT 295 08/23/2021   Lab Results  Component Value Date   FERRITIN 264 10/25/2020    Obesity Behavioral Intervention:   Approximately 15 minutes were spent on the discussion below.  ASK: We discussed the diagnosis of obesity with Melanie Reeves today and Melanie Reeves agreed to give [place] permission to discuss obesity behavioral modification therapy today.  ASSESS: Melanie Reeves has the diagnosis of obesity and her BMI today is 55.5. Melanie Reeves is in the action stage of change.   ADVISE: Melanie Reeves was educated on the multiple health risks of obesity as well as the benefit of weight loss to improve her health. She was advised of the need for long term treatment and the importance of lifestyle modifications to improve her current health and to decrease her risk of future health problems.  AGREE: Multiple dietary modification options and treatment options were discussed and Melanie Reeves agreed to follow the recommendations documented in the above note.  ARRANGE: Melanie Reeves was educated on the importance of frequent visits to treat obesity as outlined per CMS and USPSTF guidelines and agreed to schedule her next follow up appointment today.  Attestation Statements:   Reviewed by clinician on day of visit: allergies, medications, problem list, medical history, surgical history, family history, social history, and previous encounter notes.   I, Trixie Dredge, am acting as transcriptionist for Dennard Nip, MD.  I have reviewed the above documentation for accuracy and completeness, and I agree with the above. -  Dennard Nip, MD

## 2022-03-28 ENCOUNTER — Other Ambulatory Visit: Payer: Self-pay | Admitting: Endocrinology

## 2022-03-31 NOTE — Progress Notes (Unsigned)
Cardiology Office Note   Date:  04/01/2022   ID:  Melanie Reeves, DOB 08/20/1951, MRN [id number]  PCP:  Vernie Shanks, MD  Cardiologist:   Dorris Carnes, MD    F/U for atrial fbrillation   History of Present Illness: Melanie Reeves is a 71 y.o. female with a history of HTN, diastolic CHF,  PAF, DMII, HL, OSA, LBBB, morbid obesity   Myovue in 2018 was normal  Echo in Feb 2022 done showing normal LVEF and mild diastolic dysfunction I saw the pt in Nov 2022   The pt says she gets SOB at times ,esp with activity     No CP   Denies palpitations or any afib recurrence that she can sense   No dzziness    Current Meds  Medication Sig   atenolol (TENORMIN) 25 MG tablet Take 1 tablet (25 mg total) by mouth daily.   B-D ULTRAFINE III SHORT PEN 31G X 8 MM MISC USE TO INJECT MEDICATION 5 TIMES DAILY.   Blood Glucose Monitoring Suppl (ONETOUCH VERIO) w/Device KIT UAD to monitor glucose tid; Dx: E11.65   Continuous Blood Gluc Receiver (DEXCOM G6 RECEIVER) DEVI Use to display glucose from sensor   Continuous Blood Gluc Sensor (DEXCOM G6 SENSOR) MISC Use to monitor blood sugar, change after 10 days   Continuous Blood Gluc Transmit (DEXCOM G6 TRANSMITTER) MISC Change every 3 months   cyclobenzaprine (FLEXERIL) 5 MG tablet Take 5 mg by mouth 3 (three) times daily as needed for muscle spasms.   diphenhydrAMINE (BENADRYL) 25 mg capsule Take 50 mg by mouth at bedtime as needed for sleep.   DULoxetine (CYMBALTA) 30 MG capsule TAKE 1 CAPSULE BY MOUTH DAILY. TAKE IN ADDITION TO THE 60 MG   DULoxetine (CYMBALTA) 60 MG capsule Take 60 mg by mouth daily. Taking in the AM   fenofibrate (TRICOR) 145 MG tablet TAKE 1 TABLET BY MOUTH EVERY DAY   glucose blood (ONETOUCH ULTRA) test strip USE AS INSTRUCTED TO CHECK BLOOD SUGAR THREE TIMES DAILY.   HYDROcodone-acetaminophen (NORCO/VICODIN) 5-325 MG tablet Take 1 tablet by mouth every 6 (six) hours as needed for moderate pain.   ibuprofen (ADVIL) 200 MG tablet Take  400 mg by mouth every 6 (six) hours as needed for fever.   insulin aspart (NOVOLOG FLEXPEN) 100 UNIT/ML FlexPen Inject 20-40 Units into the skin 3 (three) times daily with meals.   INVOKANA 300 MG TABS tablet TAKE 1 TABLET (300 MG TOTAL) BY MOUTH DAILY BEFORE BREAKFAST.   loratadine (CLARITIN) 10 MG tablet Take 10 mg by mouth daily.   metFORMIN (GLUCOPHAGE-XR) 500 MG 24 hr tablet TAKE 4 TABLETS (2,000 MG TOTAL) BY MOUTH DAILY WITH SUPPER.   Omega-3 Fatty Acids (FISH OIL) 1000 MG CAPS Take 2,000 mg by mouth daily.   pravastatin (PRAVACHOL) 40 MG tablet TAKE 1 TABLET BY MOUTH EVERY DAY   pregabalin (LYRICA) 100 MG capsule Take 1 capsule (100 mg total) by mouth 2 (two) times daily.   tirzepatide (MOUNJARO) 12.5 MG/0.5ML Pen Inject 12.5 mg into the skin once a week.   TOUJEO MAX SOLOSTAR 300 UNIT/ML Solostar Pen INJECT 140 UNITS INTO THE SKIN DAILY. . (Patient taking differently: 154 Units.)   Vitamin D, Ergocalciferol, (DRISDOL) 1.25 MG (50000 UNIT) CAPS capsule Take 1 capsule by mouth every 7 days.   XARELTO 20 MG TABS tablet TAKE 1 TABLET BY MOUTH DAILY WITH SUPPER.   [DISCONTINUED] potassium chloride (MICRO-K) 10 MEQ CR capsule Take 1 capsule (10 mEq  total) by mouth every Monday, Wednesday, and Friday. Along with the lasix     Allergies:   Simvastatin and Sulfa antibiotics   Past Medical History:  Diagnosis Date   Abnormal electrocardiogram 08/14/2014   LBBB   Anemia    Anxiety    Arthritis    Atrial fibrillation (HCC)    Bilateral swelling of feet    Chest pain    Constipation    Depression    Diabetes mellitus ([place])    Difficulty walking    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   Qualifier: Diagnosis of  By: Aline Brochure MD, Stanley     Dyspnea 08/14/2014   Edema    Gallbladder problem    GERD (gastroesophageal reflux disease)    Hyperlipidemia    Hypertension    LOW BACK PAIN 12/20/2009   Qualifier: Diagnosis of  By: Aline Brochure MD, Stanley     Morbid obesity ([place])    OSA (obstructive  sleep apnea) 08/16/2014   Osteoarthritis    Other fatigue    Palpitations    Seasonal allergies    Shortness of breath    Shortness of breath on exertion    Sinusitis    SPINAL STENOSIS 12/20/2009   Qualifier: Diagnosis of  By: Aline Brochure MD, Stanley     Stage 3 chronic kidney disease [facility])     Past Surgical History:  Procedure Laterality Date   CHOLECYSTECTOMY     COLONOSCOPY WITH PROPOFOL N/A 11/29/2014   Procedure: COLONOSCOPY WITH PROPOFOL;  Surgeon: Arta Silence, MD;  Location: [facility];  Service: Endoscopy;  Laterality: N/A;   SKIN CANCER EXCISION     TONSILLECTOMY       Social History:  The patient  reports that she quit smoking about 28 years ago. Her smoking use included cigarettes. She has a 40.00 pack-year smoking history. She has quit using smokeless tobacco. She reports current alcohol use. She reports that she does not use drugs.   Family History:  The patient's family history includes Anxiety disorder in her father and mother; Bipolar disorder in her mother; Cancer in her father; Cancer - Lung in her father; Depression in her father and mother; Diabetes in her father; Eating disorder in her mother; Heart disease in her father; Hyperlipidemia in her father and mother; Hypertension in her father and mother; Kidney disease in her mother; Obesity in her mother; Stroke in her mother.    ROS:  Please see the history of present illness. All other systems are reviewed and  Negative to the above problem except as noted.    PHYSICAL EXAM: VS:  BP (!) 150/68   Pulse 86   Ht $R'5\' 1"'Xh$  (1.549 m)   Wt (!) 305 lb (138.3 kg)   SpO2 95%   BMI 57.63 kg/m   GEN:  Morbidly obese 71 yo , in no acute distress  HEENT: normal  Neck: Neck is full Cardiac:RRR S1, S2   No murmurs  Triv LE   edema Legs soft   Respiratory:  clear to auscultation bilaterally, GI: soft, nontender, nondistended, + BS  No hepatomegaly  MS: no deformity Moving all extremities   Skin: warm and dry, no  rash Neuro:  Strength and sensation are intact    EKG:  EKG is  ordered today.   NSR 86 bpm   LBBB  11/2020 Echo  1. Left ventricular ejection fraction, by estimation, is 55 to 60%. The left ventricle has normal function. The left ventricle has no regional wall motion abnormalities. There is  moderate concentric left ventricular hypertrophy. Left ventricular diastolic parameters are consistent with Grade I diastolic dysfunction (impaired relaxation). 2. Right ventricular systolic function is normal. The right ventricular size is normal. There is normal pulmonary artery systolic pressure. 3. The mitral valve is normal in structure. Mild mitral valve regurgitation. No evidence of mitral stenosis. 4. The aortic valve is tricuspid. Aortic valve regurgitation is not visualized. No aortic stenosis is present. Aortic valve mean gradient measures 9.0 mmHg. 5. The inferior vena cava is normal in size with greater than 50% respiratory variability, suggesting right atrial pressure of 3 mmHg. Comparison(s): A prior study was performed on 09/20/2017. No significant change from prior study. Prior images reviewed side by side.  Lipid Panel    Component Value Date/Time   CHOL 152 10/16/2021 1247   TRIG 247 (H) 10/16/2021 1247   HDL 46 10/16/2021 1247   CHOLHDL 3 07/15/2021 1054   VLDL 30.6 07/15/2021 1054   LDLCALC 66 10/16/2021 1247   LDLDIRECT 72.0 02/28/2020 1124      Wt Readings from Last 3 Encounters:  04/01/22 (!) 305 lb (138.3 kg)  03/25/22 293 lb (132.9 kg)  03/14/22 298 lb 3.2 oz (135.3 kg)      ASSESSMENT AND PLAN: 1 PAF   Found on event monitor Had some when hosp with COVID  Denies symptoms  Pt's CHADS2VASc is 3 / 4     Continue anticoagulation.       2  HTN  BP is high   WIll add amlodipine $RemoveBefore'5mg'taJASpZeefsQW$    Follow BP at home   Bring cuff to clinic in Aug for follow up    Watch for LE edema  3 Chronic diastolic CHF   Volume status difficult with pt's weight   Overall does not appear bad    Keep on same meds   4  LBBB  No ischemia on myovue in 2018  5   DM  No longer seeing Dr Dwyane Dee   has appt with new endocrinologists in the winter     6.   LIpids in Oct LDL 60  HDL 50 Trig 153     Plan for f/u of BP in Aug  Current medicines are reviewed at length with the patient today.  The patient does not have concerns regarding medicines.  Signed, Dorris Carnes, MD  04/01/2022 10:46 AM    [address], [place], [place]  [address] Phone: [phone]; Fax: [phone]

## 2022-04-01 ENCOUNTER — Ambulatory Visit: Payer: PPO | Admitting: Internal Medicine

## 2022-04-01 ENCOUNTER — Encounter: Payer: Self-pay | Admitting: Internal Medicine

## 2022-04-01 VITALS — BP 150/68 | HR 86 | Ht 61.0 in | Wt 305.0 lb

## 2022-04-01 DIAGNOSIS — I5032 Chronic diastolic (congestive) heart failure: Secondary | ICD-10-CM

## 2022-04-01 MED ORDER — CANAGLIFLOZIN 300 MG PO TABS: 300.0000 mg | ORAL_TABLET | Freq: Every day | ORAL | 1 refills | Status: DC

## 2022-04-01 MED ORDER — AMLODIPINE BESYLATE 5 MG PO TABS: 5.0000 mg | ORAL_TABLET | Freq: Every day | ORAL | 1 refills | Status: DC

## 2022-04-01 MED ORDER — PREGABALIN 100 MG PO CAPS: 100.0000 mg | ORAL_CAPSULE | Freq: Two times a day (BID) | ORAL | 1 refills | Status: DC

## 2022-04-01 NOTE — Patient Instructions (Signed)
Medication Instructions:  START Norvasc 5mg  Take 1 tablet once a day  *If you need a refill on your cardiac medications before your next appointment, please call your pharmacy*   Lab Work: None Ordered   Testing/Procedures: None Ordered   Follow-Up: At , you and your health needs are our priority.  As part of our continuing mission to provide you with exceptional heart care, we have created designated Provider Care Teams.  These Care Teams include your primary Cardiologist (physician) and Advanced Practice Providers (APPs -  Physician Assistants and Nurse Practitioners) who all work together to provide you with the care you need, when you need it.  We recommend signing up for the patient portal called "MyChart".  Sign up information is provided on this After Visit Summary.  MyChart is used to connect with patients for Virtual Visits (Telemedicine).  Patients are able to view lab/test results, encounter notes, upcoming appointments, etc.  Non-urgent messages can be sent to your provider as well.   To learn more about what you can do with MyChart, go to [employer].    Your next appointment:   3 month(s)  The format for your next appointment:   In Person  Provider:   [email or web address], MD or APP    Other Instructions Dr Dietrich Pates is doing a one time courtsey on your Lyrica and Invokana additional refills will need to come from your endocrinologist  Important Information About Sugar

## 2022-04-07 ENCOUNTER — Telehealth (INDEPENDENT_AMBULATORY_CARE_PROVIDER_SITE_OTHER): Payer: PPO | Admitting: Psychology

## 2022-04-07 DIAGNOSIS — F330 Major depressive disorder, recurrent, mild: Secondary | ICD-10-CM | POA: Diagnosis not present

## 2022-04-07 DIAGNOSIS — F5089 Other specified eating disorder: Secondary | ICD-10-CM | POA: Diagnosis not present

## 2022-04-07 DIAGNOSIS — G4733 Obstructive sleep apnea (adult) (pediatric): Secondary | ICD-10-CM | POA: Diagnosis not present

## 2022-04-08 DIAGNOSIS — H25812 Combined forms of age-related cataract, left eye: Secondary | ICD-10-CM | POA: Diagnosis not present

## 2022-04-08 DIAGNOSIS — H269 Unspecified cataract: Secondary | ICD-10-CM | POA: Diagnosis not present

## 2022-04-08 NOTE — Progress Notes (Signed)
  Office: [phone]  /  Fax: [phone]    Date: 04/22/2022   Appointment Start Time: 4:32pm Duration: 30 minutes Provider: Lawerance Cruel, Psy.D. Type of Session: Individual Therapy  Location of Patient: Sister's home in [place] (private location) Location of Provider: Provider's Home (private office) Type of Contact: Telepsychological Visit via MyChart Video Visit  Session Content: Melanie Reeves is a 71 y.o. female presenting for a follow-up appointment to address the previously established treatment goal of increasing coping skills.Today's appointment was a telepsychological visit. Melanie Reeves provided verbal consent for today's telepsychological appointment and she is aware she is responsible for securing confidentiality on her end of the session. Prior to proceeding with today's appointment, Melanie Reeves's physical location at the time of this appointment was obtained as well a phone number she could be reached at in the event of technical difficulties. Melanie Reeves and this provider participated in today's telepsychological service.   This provider conducted a brief check-in. Melanie Reeves shared about recent events, including deviations from her structured meal plan while on vacation. She stated her loved ones recently expressed concern regarding her eating habits. Processed associated thoughts and feelings. Session focused on building discrepancy to assist with motivation. She reported, "I want to do better." Between now and the next appointment, she was agreeable to keeping temptations outside of the home. Overall, Melanie Reeves was receptive to today's appointment as evidenced by openness to sharing and responsiveness to feedback.  Mental Status Examination:  Appearance: neat Behavior: appropriate to circumstances Mood: sad Affect: mood congruent Speech: WNL Eye Contact: appropriate Psychomotor Activity: WNL Gait: unable to assess Thought Process: linear, logical, and goal directed and no evidence or endorsement of  suicidal, homicidal, and self-harm ideation, plan and intent  Thought Content/Perception: no hallucinations, delusions, bizarre thinking or behavior endorsed or observed Orientation: AAOx4 Memory/Concentration: memory, attention, language, and fund of knowledge intact  Insight: fair Judgment: fair  Interventions:  Conducted a brief chart review Provided empathic reflections and validation Employed supportive psychotherapy interventions to facilitate reduced distress and to improve coping skills with identified stressors Employed motivational interviewing skills to assess patient's willingness/desire to adhere to recommended medical treatments and assignments  DSM-5 Diagnosis(es):  F50.89 Other Specified Feeding or Eating Disorder, Emotional and Binge Eating Behaviors and F33.0 Major Depressive Disorder, Recurrent Episode, Mild  Treatment Goal & Progress: During the initial appointment with this provider, the following treatment goal was established: increase coping skills. Progress is limited, as Melanie Reeves has just begun treatment with this provider; however, she is receptive to the interaction and interventions and rapport is being established.   Plan: The next appointment is scheduled for 05/06/2022 at 9:30am, which will be via MyChart Video Visit. The next session will focus on working towards the established treatment goal.

## 2022-04-14 ENCOUNTER — Other Ambulatory Visit (INDEPENDENT_AMBULATORY_CARE_PROVIDER_SITE_OTHER): Payer: Self-pay | Admitting: Family Medicine

## 2022-04-14 DIAGNOSIS — E559 Vitamin D deficiency, unspecified: Secondary | ICD-10-CM

## 2022-04-16 NOTE — Progress Notes (Signed)
TeleHealth Visit:  This visit was completed with telemedicine (audio/video) technology. Melanie Reeves has verbally consented to this TeleHealth visit. The patient is located at home, the provider is located at home. The participants in this visit include the listed provider and patient. The visit was conducted today via MyChart video.  OBESITY Melanie Reeves is here to discuss her progress with her obesity treatment plan along with follow-up of her obesity related diagnoses.   Today's visit was # 21 Starting weight: 294 lbs Starting date: 11/29/2020 Weight at last in office visit: 293 lbs on 03/25/22 Total weight loss: 1 lbs at last in office visit on 03/25/22. Today's reported weight:  No weight reported.  Nutrition Plan: keeping a food journal and adhering to recommended goals of 1600 calories and 100+ gms protein.  Has not been journaling due to being on vacation Current exercise:  trying to be more active  Interim History: Melanie Reeves has been at her vacation home for the past several weeks and did not follow any plan.  She is now back at home and wants to begin journaling. She has many supportive friends in her life that want to help her in her endeavor. She is used to very large portions-for instance she likes to eat 16 ounces of meat as her portion when she is not on a meal plan.  Assessment/Plan:  1. Type II Diabetes HgbA1c is not at goal. Last A1c was 7.2. She has an appointment with a new endocrinologist, Dr. Fredia Sorrow, on November 1. CBGs: Not checking.  Not checking because she doesn't like to stick her finger. She would like a CGM and asks for an order for a [employer]. Episodes of hypoglycemia? No Medication(s): Tresiba 154 units daily, Metformin 2000 mg daily, Mounjaro 12.5 mg weekly, Invokana 300 mg daily.  Lab Results  Component Value Date   HGBA1C 7.2 (H) 03/11/2022   HGBA1C 6.9 (H) 11/26/2021   HGBA1C 6.8 (H) 10/16/2021   Lab Results  Component Value Date    MICROALBUR <0.7 07/15/2021   LDLCALC 66 10/16/2021   CREATININE 0.99 03/11/2022    Plan: ContinueTresiba 154 units daily, Metformin 2000 mg daily, Mounjaro 12.5 mg weekly, Invokana 300 mg daily. New prescription for [facility].  Encouraged her to check blood sugars 4-5 times per week fasting and after meals in the meanwhile. Refill Mounjaro 12.5 mg weekly.  2. Vitamin D Deficiency Vitamin D is not at goal of 50-last vitamin D was slightly low at 43.1 on 10/16/2021.Marland Kitchen She is on weekly prescription Vitamin D 50,000 IU.  Lab Results  Component Value Date   VD25OH 43.1 10/16/2021   VD25OH 36.2 06/05/2021   VD25OH 19.7 (L) 11/29/2020    Plan: Refill prescription vitamin D 50,000 IU weekly.  3. Obesity: Current BMI 55.5 Melanie Reeves is currently in the action stage of change. As such, her goal is to continue with weight loss efforts.  She has agreed to keeping a food journal and adhering to recommended goals of 1600 calories and 100+ gms protein. .   1. She will download the  Lose It!  App today and begin tracking her food. 2. She may have 10 ounces of meat at dinner and 3 cups of vegetables if she needs this to be full.  Advised her to eat slowly and wait 10-15 minutes before getting extra cup of vegetables.  Exercise goals: as is  Behavioral modification strategies: increasing lean protein intake, decreasing simple carbohydrates, planning for success, and keeping a strict food journal.  Melanie Reeves  has agreed to follow-up with our clinic in 4 weeks.   No orders of the defined types were placed in this encounter.   Medications Discontinued During This Encounter  Medication Reason   Continuous Blood Gluc Sensor (DEXCOM G6 SENSOR) MISC Duplicate   tirzepatide (MOUNJARO) 12.5 MG/0.5ML Pen Reorder   Vitamin D, Ergocalciferol, (DRISDOL) 1.25 MG (50000 UNIT) CAPS capsule Reorder     Meds ordered this encounter  Medications   Continuous Blood Gluc Sensor (FREESTYLE LIBRE 3 SENSOR) MISC     Sig: Place 1 sensor on the skin every 14 days. Use morning and evening to check glucose continuously    Dispense:  1 each    Refill:  0    Order Specific Question:   Supervising Provider    Answer:   Quillian Quince D [[id number]]   tirzepatide (MOUNJARO) 12.5 MG/0.5ML Pen    Sig: Inject 12.5 mg into the skin once a week.    Dispense:  6 mL    Refill:  0    Order Specific Question:   Supervising Provider    Answer:   Quillian Quince D [[id number]]   Vitamin D, Ergocalciferol, (DRISDOL) 1.25 MG (50000 UNIT) CAPS capsule    Sig: Take 1 capsule by mouth every 7 days.    Dispense:  4 capsule    Refill:  0    Order Specific Question:   Supervising Provider    Answer:   Quillian Quince D [[id number]]      Objective:   VITALS: Per patient if applicable, see vitals. GENERAL: Alert and in no acute distress. CARDIOPULMONARY: No increased WOB. Speaking in clear sentences.  PSYCH: Pleasant and cooperative. Speech normal rate and rhythm. Affect is appropriate. Insight and judgement are appropriate. Attention is focused, linear, and appropriate.  NEURO: Oriented as arrived to appointment on time with no prompting.   Lab Results  Component Value Date   CREATININE 0.99 03/11/2022   BUN 40 (H) 03/11/2022   NA 140 03/11/2022   K 4.5 03/11/2022   CL 104 03/11/2022   CO2 26 03/11/2022   Lab Results  Component Value Date   ALT 27 10/16/2021   AST 32 10/16/2021   ALKPHOS 69 10/16/2021   BILITOT <0.2 10/16/2021   Lab Results  Component Value Date   HGBA1C 7.2 (H) 03/11/2022   HGBA1C 6.9 (H) 11/26/2021   HGBA1C 6.8 (H) 10/16/2021   HGBA1C 6.5 07/15/2021   HGBA1C 6.5 03/19/2021   Lab Results  Component Value Date   INSULIN 34.8 (H) 10/16/2021   Lab Results  Component Value Date   TSH 1.217 11/24/2020   Lab Results  Component Value Date   CHOL 152 10/16/2021   HDL 46 10/16/2021   LDLCALC 66 10/16/2021   LDLDIRECT 72.0 02/28/2020   TRIG 247 (H) 10/16/2021   CHOLHDL 3 07/15/2021   Lab  Results  Component Value Date   WBC 13.9 (H) 08/23/2021   HGB 13.1 08/23/2021   HCT 41.4 08/23/2021   MCV 86 08/23/2021   PLT 295 08/23/2021   Lab Results  Component Value Date   FERRITIN 264 10/25/2020   Lab Results  Component Value Date   VD25OH 43.1 10/16/2021   VD25OH 36.2 06/05/2021   VD25OH 19.7 (L) 11/29/2020    Attestation Statements:   Reviewed by clinician on day of visit: allergies, medications, problem list, medical history, surgical history, family history, social history, and previous encounter notes.

## 2022-04-17 ENCOUNTER — Telehealth (INDEPENDENT_AMBULATORY_CARE_PROVIDER_SITE_OTHER): Payer: PPO | Admitting: Family Medicine

## 2022-04-21 ENCOUNTER — Other Ambulatory Visit (HOSPITAL_COMMUNITY): Payer: Self-pay

## 2022-04-21 ENCOUNTER — Encounter (INDEPENDENT_AMBULATORY_CARE_PROVIDER_SITE_OTHER): Payer: Self-pay | Admitting: Family Medicine

## 2022-04-21 ENCOUNTER — Telehealth (INDEPENDENT_AMBULATORY_CARE_PROVIDER_SITE_OTHER): Payer: PPO | Admitting: Family Medicine

## 2022-04-21 DIAGNOSIS — E1169 Type 2 diabetes mellitus with other specified complication: Secondary | ICD-10-CM | POA: Diagnosis not present

## 2022-04-21 DIAGNOSIS — Z6841 Body Mass Index (BMI) 40.0 and over, adult: Secondary | ICD-10-CM

## 2022-04-21 DIAGNOSIS — Z7984 Long term (current) use of oral hypoglycemic drugs: Secondary | ICD-10-CM

## 2022-04-21 DIAGNOSIS — E559 Vitamin D deficiency, unspecified: Secondary | ICD-10-CM

## 2022-04-21 DIAGNOSIS — E669 Obesity, unspecified: Secondary | ICD-10-CM

## 2022-04-21 DIAGNOSIS — Z7985 Long-term (current) use of injectable non-insulin antidiabetic drugs: Secondary | ICD-10-CM

## 2022-04-21 DIAGNOSIS — Z794 Long term (current) use of insulin: Secondary | ICD-10-CM | POA: Diagnosis not present

## 2022-04-21 MED ORDER — MOUNJARO 12.5 MG/0.5ML ~~LOC~~ SOAJ
12.5000 mg | SUBCUTANEOUS | 0 refills | Status: DC
  Filled 2022-04-21: qty 6, 84d supply, fill #0

## 2022-04-21 MED ORDER — VITAMIN D (ERGOCALCIFEROL) 1.25 MG (50000 UNIT) PO CAPS
50000.0000 [IU] | ORAL_CAPSULE | ORAL | 0 refills | Status: DC
  Filled 2022-04-21: qty 4, 28d supply, fill #0

## 2022-04-21 MED ORDER — FREESTYLE LIBRE 3 SENSOR MISC
1.0000 | Freq: Two times a day (BID) | 0 refills | Status: DC
  Filled 2022-04-21: qty 1, 14d supply, fill #0

## 2022-04-22 ENCOUNTER — Telehealth (INDEPENDENT_AMBULATORY_CARE_PROVIDER_SITE_OTHER): Payer: PPO | Admitting: Psychology

## 2022-04-22 DIAGNOSIS — F5089 Other specified eating disorder: Secondary | ICD-10-CM | POA: Diagnosis not present

## 2022-04-22 DIAGNOSIS — F330 Major depressive disorder, recurrent, mild: Secondary | ICD-10-CM | POA: Diagnosis not present

## 2022-04-25 ENCOUNTER — Other Ambulatory Visit: Payer: Self-pay | Admitting: Endocrinology

## 2022-05-01 DIAGNOSIS — Z0000 Encounter for general adult medical examination without abnormal findings: Secondary | ICD-10-CM | POA: Diagnosis not present

## 2022-05-01 DIAGNOSIS — Z1389 Encounter for screening for other disorder: Secondary | ICD-10-CM | POA: Diagnosis not present

## 2022-05-01 DIAGNOSIS — Z6841 Body Mass Index (BMI) 40.0 and over, adult: Secondary | ICD-10-CM | POA: Diagnosis not present

## 2022-05-05 ENCOUNTER — Ambulatory Visit (INDEPENDENT_AMBULATORY_CARE_PROVIDER_SITE_OTHER): Payer: PPO | Admitting: Family Medicine

## 2022-05-05 ENCOUNTER — Encounter (INDEPENDENT_AMBULATORY_CARE_PROVIDER_SITE_OTHER): Payer: Self-pay | Admitting: Family Medicine

## 2022-05-05 VITALS — BP 146/76 | HR 97 | Temp 97.6°F | Ht 61.0 in | Wt 289.0 lb

## 2022-05-05 DIAGNOSIS — Z6841 Body Mass Index (BMI) 40.0 and over, adult: Secondary | ICD-10-CM | POA: Diagnosis not present

## 2022-05-05 DIAGNOSIS — E785 Hyperlipidemia, unspecified: Secondary | ICD-10-CM

## 2022-05-05 DIAGNOSIS — E669 Obesity, unspecified: Secondary | ICD-10-CM

## 2022-05-05 DIAGNOSIS — Z7985 Long-term (current) use of injectable non-insulin antidiabetic drugs: Secondary | ICD-10-CM | POA: Diagnosis not present

## 2022-05-05 DIAGNOSIS — E1169 Type 2 diabetes mellitus with other specified complication: Secondary | ICD-10-CM

## 2022-05-05 MED ORDER — FREESTYLE LIBRE 3 SENSOR MISC: 1.0000 | Freq: Two times a day (BID) | 0 refills | Status: DC

## 2022-05-06 ENCOUNTER — Telehealth (INDEPENDENT_AMBULATORY_CARE_PROVIDER_SITE_OTHER): Payer: Self-pay | Admitting: Psychology

## 2022-05-06 ENCOUNTER — Telehealth (INDEPENDENT_AMBULATORY_CARE_PROVIDER_SITE_OTHER): Payer: PPO | Admitting: Psychology

## 2022-05-06 DIAGNOSIS — F5089 Other specified eating disorder: Secondary | ICD-10-CM | POA: Diagnosis not present

## 2022-05-06 DIAGNOSIS — F330 Major depressive disorder, recurrent, mild: Secondary | ICD-10-CM

## 2022-05-06 NOTE — Telephone Encounter (Signed)
  Office: [phone]  /  Fax: [phone]  Date of Call: May 06, 2022  Time of Call: 9:34am Provider: Lawerance Cruel, PsyD  CONTENT:  This provider called Liesl to check-in as she did not present for today's MyChart Video Visit appointment at 9:30am. A HIPAA compliant voicemail was left requesting a call back.   PLAN: This provider will wait for Embry to call back. No further follow-up planned by this provider.

## 2022-05-06 NOTE — Progress Notes (Signed)
  Office: [phone]  /  Fax: [phone]    Date: May 06, 2022    Appointment Start Time: 2:34pm Duration: 26 minutes Provider: Lawerance Cruel, Psy.D. Type of Session: Individual Therapy  Location of Patient: Home (private location) Location of Provider: Provider's Home (private office) Type of Contact: Telepsychological Visit via MyChart Video Visit  Session Content: Melanie Reeves is a 71 y.o. female presenting for a follow-up appointment to address the previously established treatment goal of increasing coping skills.Today's appointment was a telepsychological visit. Melanie Reeves provided verbal consent for today's telepsychological appointment and she is aware she is responsible for securing confidentiality on her end of the session. Prior to proceeding with today's appointment, Melanie Reeves's physical location at the time of this appointment was obtained as well a phone number she could be reached at in the event of technical difficulties. Melanie Reeves and this provider participated in today's telepsychological service.   This provider conducted a brief check-in. Melanie Reeves shared, "I've had a pretty good couple of weeks. I lost four pounds. I've been eating better." She shared reflecting on the last appointment with this provider helped renew motivation and make better choices. Psychoeducation regarding triggers for emotional eating was provided. Melanie Reeves was provided a handout, and encouraged to utilize the handout between now and the next appointment to increase awareness of triggers and frequency. Melanie Reeves agreed. This provider also discussed behavioral strategies for specific triggers, such as placing the utensil down when conversing to avoid mindless eating. Melanie Reeves provided verbal consent during today's appointment for this provider to send a handout about triggers via e-mail. Melanie Reeves was receptive to today's appointment as evidenced by openness to sharing, responsiveness to feedback, and willingness to explore triggers for  emotional eating.  Mental Status Examination:  Appearance: neat Behavior: appropriate to circumstances Mood: neutral Affect: mood congruent Speech: WNL Eye Contact: appropriate Psychomotor Activity: WNL Gait: unable to assess Thought Process: linear, logical, and goal directed and no evidence or endorsement of suicidal, homicidal, and self-harm ideation, plan and intent  Thought Content/Perception: no hallucinations, delusions, bizarre thinking or behavior endorsed or observed Orientation: AAOx4 Memory/Concentration: memory, attention, language, and fund of knowledge intact  Insight: fair Judgment: fair  Interventions:  Conducted a brief chart review Provided empathic reflections and validation Reviewed content from the previous session Provided positive reinforcement Employed supportive psychotherapy interventions to facilitate reduced distress and to improve coping skills with identified stressors Psychoeducation provided regarding triggers for emotional eating behaviors  DSM-5 Diagnosis(es): F50.89 Other Specified Feeding or Eating Disorder, Emotional and Binge Eating Behaviors and F33.0 Major Depressive Disorder, Recurrent Episode, Mild  Treatment Goal & Progress: During the initial appointment with this provider, the following treatment goal was established: increase coping skills. Melanie Reeves has demonstrated some progress in her goal as evidenced by increased awareness of hunger patterns.   Plan: The next appointment is scheduled for 06/02/2022 at 10am, which will be via MyChart Video Visit. The next session will focus on working towards the established treatment goal. The previously sent referrals will be sent again via e-mail.

## 2022-05-09 ENCOUNTER — Other Ambulatory Visit: Payer: Self-pay | Admitting: Internal Medicine

## 2022-05-09 DIAGNOSIS — I480 Paroxysmal atrial fibrillation: Secondary | ICD-10-CM

## 2022-05-09 DIAGNOSIS — M85851 Other specified disorders of bone density and structure, right thigh: Secondary | ICD-10-CM | POA: Diagnosis not present

## 2022-05-09 DIAGNOSIS — Z780 Asymptomatic menopausal state: Secondary | ICD-10-CM | POA: Diagnosis not present

## 2022-05-09 DIAGNOSIS — Z1231 Encounter for screening mammogram for malignant neoplasm of breast: Secondary | ICD-10-CM | POA: Diagnosis not present

## 2022-05-09 DIAGNOSIS — M85852 Other specified disorders of bone density and structure, left thigh: Secondary | ICD-10-CM | POA: Diagnosis not present

## 2022-05-09 NOTE — Telephone Encounter (Signed)
Xarelto 20mg  refill request received. Pt is 71 years old, weight-131.1kg, Crea-0.99 on 03/11/2022, last seen by Dr. 03/13/2022 on 04/01/2022, Diagnosis-Afib, CrCl-112.22ml/min; Dose is appropriate based on dosing criteria. Will send in refill to requested pharmacy.

## 2022-05-12 DIAGNOSIS — E782 Mixed hyperlipidemia: Secondary | ICD-10-CM | POA: Diagnosis not present

## 2022-05-12 DIAGNOSIS — M25512 Pain in left shoulder: Secondary | ICD-10-CM | POA: Diagnosis not present

## 2022-05-12 DIAGNOSIS — E119 Type 2 diabetes mellitus without complications: Secondary | ICD-10-CM | POA: Diagnosis not present

## 2022-05-12 DIAGNOSIS — G8929 Other chronic pain: Secondary | ICD-10-CM | POA: Diagnosis not present

## 2022-05-12 DIAGNOSIS — Z794 Long term (current) use of insulin: Secondary | ICD-10-CM | POA: Diagnosis not present

## 2022-05-12 DIAGNOSIS — I480 Paroxysmal atrial fibrillation: Secondary | ICD-10-CM | POA: Diagnosis not present

## 2022-05-12 DIAGNOSIS — I509 Heart failure, unspecified: Secondary | ICD-10-CM | POA: Diagnosis not present

## 2022-05-12 DIAGNOSIS — I10 Essential (primary) hypertension: Secondary | ICD-10-CM | POA: Diagnosis not present

## 2022-05-12 DIAGNOSIS — Z1321 Encounter for screening for nutritional disorder: Secondary | ICD-10-CM | POA: Diagnosis not present

## 2022-05-12 NOTE — Progress Notes (Unsigned)
Chief Complaint:   OBESITY Melanie Reeves is here to discuss her progress with her obesity treatment plan along with follow-up of her obesity related diagnoses. Melanie Reeves is on keeping a food journal and adhering to recommended goals of 1600 calories and 100+ grams of protein daily and states she is following her eating plan approximately 80% of the time. Melanie Reeves states she is doing 0 minutes 0 times per week.  Today's visit was #: 22 Starting weight: 294 lbs Starting date: 11/29/2020 Today's weight: 289 lbs Today's date: 05/05/2022 Total lbs lost to date: 5 Total lbs lost since last in-office visit: 4  Interim History: Melanie Reeves continues to work on her diet and weight loss, and she has done well overall.  She is working on portion control and trying to increase her protein.  She has new teeth and she is chewing better.  Subjective:   1. Type 2 diabetes mellitus with other specified complication, with long-term current use of insulin (HCC) Trishia continues to work on her diet and weight loss.  Her glucose is mostly ranging between 100-150 throughout the day.  She is not on NovoLog anymore but she is on Mounjaro.  2. Hyperlipidemia, unspecified hyperlipidemia type Melanie Reeves is on Pravachol and she is working on decreasing cholesterol in her diet.  She denies chest pain or myalgias.  Assessment/Plan:   1. Type 2 diabetes mellitus with other specified complication, with long-term current use of insulin (HCC) Melanie Reeves will continue her medications, and we will send FreeStyle sensor to the pharmacy.  - Continuous Blood Gluc Sensor (FREESTYLE LIBRE 3 SENSOR) MISC; Place 1 sensor on the skin every 14 days. Use morning and evening to check glucose continuously  Dispense: 3 each; Refill: 0  2. Hyperlipidemia, unspecified hyperlipidemia type Melanie Reeves will continue with her diet, exercise, and weight loss.  3. Obesity, Current BMI 54.7 Melanie Reeves is currently in the action stage of change. As such, her goal is to  continue with weight loss efforts. She has agreed to practicing portion control and making smarter food choices, such as increasing vegetables and decreasing simple carbohydrates.   Behavioral modification strategies: increasing lean protein intake.  Melanie Reeves has agreed to follow-up with our clinic in 3 weeks. She was informed of the importance of frequent follow-up visits to maximize her success with intensive lifestyle modifications for her multiple health conditions.   Objective:   Blood pressure (!) 146/76, pulse 97, temperature 97.6 F (36.4 C), height 5\' 1"  (1.549 m), weight 289 lb (131.1 kg), SpO2 100 %. Body mass index is 54.61 kg/m.  General: Cooperative, alert, well developed, in no acute distress. HEENT: Conjunctivae and lids unremarkable. Cardiovascular: Regular rhythm.  Lungs: Normal work of breathing. Neurologic: No focal deficits.   Lab Results  Component Value Date   CREATININE 0.99 03/11/2022   BUN 40 (H) 03/11/2022   NA 140 03/11/2022   K 4.5 03/11/2022   CL 104 03/11/2022   CO2 26 03/11/2022   Lab Results  Component Value Date   ALT 27 10/16/2021   AST 32 10/16/2021   ALKPHOS 69 10/16/2021   BILITOT <0.2 10/16/2021   Lab Results  Component Value Date   HGBA1C 7.2 (H) 03/11/2022   HGBA1C 6.9 (H) 11/26/2021   HGBA1C 6.8 (H) 10/16/2021   HGBA1C 6.5 07/15/2021   HGBA1C 6.5 03/19/2021   Lab Results  Component Value Date   INSULIN 34.8 (H) 10/16/2021   Lab Results  Component Value Date   TSH 1.217 11/24/2020   Lab  Results  Component Value Date   CHOL 152 10/16/2021   HDL 46 10/16/2021   LDLCALC 66 10/16/2021   LDLDIRECT 72.0 02/28/2020   TRIG 247 (H) 10/16/2021   CHOLHDL 3 07/15/2021   Lab Results  Component Value Date   VD25OH 43.1 10/16/2021   VD25OH 36.2 06/05/2021   VD25OH 19.7 (L) 11/29/2020   Lab Results  Component Value Date   WBC 13.9 (H) 08/23/2021   HGB 13.1 08/23/2021   HCT 41.4 08/23/2021   MCV 86 08/23/2021   PLT 295  08/23/2021   Lab Results  Component Value Date   FERRITIN 264 10/25/2020   Attestation Statements:   Reviewed by clinician on day of visit: allergies, medications, problem list, medical history, surgical history, family history, social history, and previous encounter notes.   I, Burt Knack, am acting as transcriptionist for Quillian Quince, MD.  I have reviewed the above documentation for accuracy and completeness, and I agree with the above. -  Quillian Quince, MD

## 2022-05-15 NOTE — Progress Notes (Signed)
TeleHealth Visit:  This visit was completed with telemedicine (audio/video) technology. Clint has verbally consented to this TeleHealth visit. The patient is located at home, the provider is located at home. The participants in this visit include the listed provider and patient. The visit was conducted today via MyChart video.  OBESITY Dola is here to discuss her progress with her obesity treatment plan along with follow-up of her obesity related diagnoses.   Today's visit was # 23 Starting weight: 294 lbs Starting date: 11/29/2020 Weight at last in office visit: 289 lbs on 05/05/22 Total weight loss: 5 lbs at last in office visit on 05/05/22. Today's reported weight:  No weight reported.  Nutrition Plan: practicing portion control and making smarter food choices, such as increasing vegetables and decreasing simple carbohydrates.   Current exercise: none  Interim History: Catalyna has been trying to make good choices with her food by avoiding sweets/carbohydrates and increasing protein.  She drinks Crystal light tea rather than sugar sweetened beverages.  She and her husband eat seldom eat out.  He does the cooking. She typically eats 1-2 meals per day.   Assessment/Plan:  1. Type II Diabetes A1c at new PCP (Dr. Antony Haste) was 6.9-at goal.  She has an appointment with a new endocrinologist on September 19-Dr. Shawnee Knapp [facility]). She tracks carbs with meals with the Lose It! App. CBGs: Recently obtained CGM/Libre 3.  In range 99% of time.  She feels the CGM has helped her with glucose control by making her more aware of her blood sugars.  She recently adjusted her Toujeo dose from 145 to 125 based on her CBGs. Episodes of hypoglycemia: no.  Only takes NovoLog with a meal. Medication(s): Toujeo 125 units daily, NovoLog 20 to 40 units with meals, Invokana 300 mg daily, metformin XR 2000 mg with supper, Mounjaro 12.5 mg.  Lab Results  Component Value Date   HGBA1C 7.2 (H)  03/11/2022   HGBA1C 6.9 (H) 11/26/2021   HGBA1C 6.8 (H) 10/16/2021   Lab Results  Component Value Date   MICROALBUR <0.7 07/15/2021   LDLCALC 66 10/16/2021   CREATININE 0.99 03/11/2022    Plan: Continue all medications at current dosages. Follow-up with Dr. Shawnee Knapp on September 19.   2. Obesity: Current BMI 54.7 Burkley is currently in the action stage of change. As such, her goal is to continue with weight loss efforts.  She has agreed to keeping a food journal and adhering to recommended goals of 1800-2000 calories and 100 gms protein.   Will try having her track calories and protein versus portion control smart choices.  Exercise goals:  chair exercises 3 times per week - [employer] modification strategies: increasing lean protein intake, decreasing simple carbohydrates, no skipping meals, and keeping a strict food journal.  Shaunda has agreed to follow-up with our clinic in 4 weeks.   No orders of the defined types were placed in this encounter.   Medications Discontinued During This Encounter  Medication Reason   Continuous Blood Gluc Receiver (DEXCOM G6 RECEIVER) DEVI Cost of medication   Continuous Blood Gluc Transmit (DEXCOM G6 TRANSMITTER) MISC Cost of medication   Continuous Blood Gluc Sensor (FREESTYLE LIBRE 3 SENSOR) MISC Duplicate     Meds ordered this encounter  Medications   Continuous Blood Gluc Sensor (FREESTYLE LIBRE 3 SENSOR) MISC    Sig: Place 1 sensor on the skin every 14 days. Use to check glucose continuously    Dispense:  2 each  Refill:  0    Order Specific Question:   Supervising Provider    Answer:   Quillian Quince D [[id number]]      Objective:   VITALS: Per patient if applicable, see vitals. GENERAL: Alert and in no acute distress. CARDIOPULMONARY: No increased WOB. Speaking in clear sentences.  PSYCH: Pleasant and cooperative. Speech normal rate and rhythm. Affect is appropriate. Insight and judgement are appropriate.  Attention is focused, linear, and appropriate.  NEURO: Oriented as arrived to appointment on time with no prompting.   Lab Results  Component Value Date   CREATININE 0.99 03/11/2022   BUN 40 (H) 03/11/2022   NA 140 03/11/2022   K 4.5 03/11/2022   CL 104 03/11/2022   CO2 26 03/11/2022   Lab Results  Component Value Date   ALT 27 10/16/2021   AST 32 10/16/2021   ALKPHOS 69 10/16/2021   BILITOT <0.2 10/16/2021   Lab Results  Component Value Date   HGBA1C 7.2 (H) 03/11/2022   HGBA1C 6.9 (H) 11/26/2021   HGBA1C 6.8 (H) 10/16/2021   HGBA1C 6.5 07/15/2021   HGBA1C 6.5 03/19/2021   Lab Results  Component Value Date   INSULIN 34.8 (H) 10/16/2021   Lab Results  Component Value Date   TSH 1.217 11/24/2020   Lab Results  Component Value Date   CHOL 152 10/16/2021   HDL 46 10/16/2021   LDLCALC 66 10/16/2021   LDLDIRECT 72.0 02/28/2020   TRIG 247 (H) 10/16/2021   CHOLHDL 3 07/15/2021   Lab Results  Component Value Date   WBC 13.9 (H) 08/23/2021   HGB 13.1 08/23/2021   HCT 41.4 08/23/2021   MCV 86 08/23/2021   PLT 295 08/23/2021   Lab Results  Component Value Date   FERRITIN 264 10/25/2020   Lab Results  Component Value Date   VD25OH 43.1 10/16/2021   VD25OH 36.2 06/05/2021   VD25OH 19.7 (L) 11/29/2020    Attestation Statements:   Reviewed by clinician on day of visit: allergies, medications, problem list, medical history, surgical history, family history, social history, and previous encounter notes.  Time spent on visit including the items listed below was 35 minutes.  -preparing to see the patient (e.g., review of tests, history, previous notes) -obtaining and/or reviewing separately obtained history -counseling and educating the patient/family/caregiver -documenting clinical information in the electronic or other health record -ordering medications, tests, or procedures

## 2022-05-19 ENCOUNTER — Ambulatory Visit (INDEPENDENT_AMBULATORY_CARE_PROVIDER_SITE_OTHER): Payer: PPO | Admitting: Family Medicine

## 2022-05-19 ENCOUNTER — Telehealth (INDEPENDENT_AMBULATORY_CARE_PROVIDER_SITE_OTHER): Payer: PPO | Admitting: Family Medicine

## 2022-05-19 ENCOUNTER — Encounter (INDEPENDENT_AMBULATORY_CARE_PROVIDER_SITE_OTHER): Payer: Self-pay | Admitting: Family Medicine

## 2022-05-19 DIAGNOSIS — E1169 Type 2 diabetes mellitus with other specified complication: Secondary | ICD-10-CM | POA: Diagnosis not present

## 2022-05-19 DIAGNOSIS — Z794 Long term (current) use of insulin: Secondary | ICD-10-CM | POA: Diagnosis not present

## 2022-05-19 DIAGNOSIS — E669 Obesity, unspecified: Secondary | ICD-10-CM

## 2022-05-19 DIAGNOSIS — Z7985 Long-term (current) use of injectable non-insulin antidiabetic drugs: Secondary | ICD-10-CM

## 2022-05-19 DIAGNOSIS — Z6841 Body Mass Index (BMI) 40.0 and over, adult: Secondary | ICD-10-CM

## 2022-05-19 MED ORDER — FREESTYLE LIBRE 3 SENSOR MISC: 0 refills | Status: DC

## 2022-05-21 ENCOUNTER — Encounter (INDEPENDENT_AMBULATORY_CARE_PROVIDER_SITE_OTHER): Payer: Self-pay

## 2022-05-21 DIAGNOSIS — G4733 Obstructive sleep apnea (adult) (pediatric): Secondary | ICD-10-CM | POA: Diagnosis not present

## 2022-06-02 ENCOUNTER — Telehealth (INDEPENDENT_AMBULATORY_CARE_PROVIDER_SITE_OTHER): Payer: PPO | Admitting: Psychology

## 2022-06-10 ENCOUNTER — Other Ambulatory Visit (HOSPITAL_COMMUNITY): Payer: Self-pay

## 2022-06-10 ENCOUNTER — Ambulatory Visit (INDEPENDENT_AMBULATORY_CARE_PROVIDER_SITE_OTHER): Payer: PPO | Admitting: Family Medicine

## 2022-06-10 ENCOUNTER — Encounter (INDEPENDENT_AMBULATORY_CARE_PROVIDER_SITE_OTHER): Payer: Self-pay | Admitting: Family Medicine

## 2022-06-10 ENCOUNTER — Telehealth (INDEPENDENT_AMBULATORY_CARE_PROVIDER_SITE_OTHER): Payer: PPO | Admitting: Psychology

## 2022-06-10 VITALS — BP 148/82 | HR 90 | Temp 97.4°F | Ht 61.0 in | Wt 286.0 lb

## 2022-06-10 DIAGNOSIS — Z794 Long term (current) use of insulin: Secondary | ICD-10-CM | POA: Diagnosis not present

## 2022-06-10 DIAGNOSIS — F5089 Other specified eating disorder: Secondary | ICD-10-CM | POA: Diagnosis not present

## 2022-06-10 DIAGNOSIS — E669 Obesity, unspecified: Secondary | ICD-10-CM

## 2022-06-10 DIAGNOSIS — F3289 Other specified depressive episodes: Secondary | ICD-10-CM

## 2022-06-10 DIAGNOSIS — E559 Vitamin D deficiency, unspecified: Secondary | ICD-10-CM

## 2022-06-10 DIAGNOSIS — F330 Major depressive disorder, recurrent, mild: Secondary | ICD-10-CM

## 2022-06-10 DIAGNOSIS — E1169 Type 2 diabetes mellitus with other specified complication: Secondary | ICD-10-CM

## 2022-06-10 DIAGNOSIS — Z7985 Long-term (current) use of injectable non-insulin antidiabetic drugs: Secondary | ICD-10-CM

## 2022-06-10 DIAGNOSIS — Z6841 Body Mass Index (BMI) 40.0 and over, adult: Secondary | ICD-10-CM | POA: Diagnosis not present

## 2022-06-10 MED ORDER — VITAMIN D (ERGOCALCIFEROL) 1.25 MG (50000 UNIT) PO CAPS: 50000.0000 [IU] | ORAL_CAPSULE | ORAL | 0 refills | Status: DC

## 2022-06-10 MED ORDER — TIRZEPATIDE 15 MG/0.5ML ~~LOC~~ SOAJ
15.0000 mg | SUBCUTANEOUS | 0 refills | Status: DC
  Filled 2022-06-10: qty 6, 84d supply, fill #0

## 2022-06-10 NOTE — Progress Notes (Signed)
  Office: [phone]  /  Fax: [phone]    Date: June 10, 2022    Appointment Start Time: 2:30pm Duration: 24 minutes Provider: Lawerance Cruel, Psy.D. Type of Session: Individual Therapy  Location of Patient:  Dentist's office in [place]  (private/safe location) Location of Provider: Provider's Home (private office) Type of Contact: Telepsychological Visit via MyChart Video Visit  Session Content: Melanie Reeves is a 71 y.o. female presenting for a follow-up appointment to address the previously established treatment goal of increasing coping skills.Today's appointment was a telepsychological visit. Melanie Reeves provided verbal consent for today's telepsychological appointment and she is aware she is responsible for securing confidentiality on her end of the session. Prior to proceeding with today's appointment, Melanie Reeves's physical location at the time of this appointment was obtained as well a phone number she could be reached at in the event of technical difficulties. Melanie Reeves and this provider participated in today's telepsychological service.   This provider conducted a brief check-in. Melanie Reeves shared about her recent appointment with Dr. Dalbert Garnet, noting she lost more weight. She further shared about recent events, including making better choices and engaging in portion control while on vacation. She also discussed a reduction in emotional and binge eating behaviors. Reviewed triggers for emotional eating behaviors. Psychoeducation regarding pleasurable activities, including its impact on emotional eating and overall well-being was provided. Melanie Reeves was provided with a handout with various options of pleasurable activities, and was encouraged to engage in one activity a day and additional activities as needed when triggered to emotionally eat. Melanie Reeves agreed. Melanie Reeves provided verbal consent during today's appointment for this provider to send a handout with activities via e-mail. Of note, Melanie Reeves stated she had not initiated  therapeutic services due to financial concern. She was receptive to contacting her insurance company between now and the next appointment with this provider. Overall, Melanie Reeves was receptive to today's appointment as evidenced by openness to sharing, responsiveness to feedback, and willingness to engage in pleasurable activities to assist with coping.  Mental Status Examination:  Appearance: neat Behavior: appropriate to circumstances Mood: neutral Affect: mood congruent Speech: WNL Eye Contact: appropriate Psychomotor Activity: WNL Gait: unable to assess Thought Process: linear, logical, and goal directed and no evidence or endorsement of suicidal, homicidal, and self-harm ideation, plan and intent  Thought Content/Perception: no hallucinations, delusions, bizarre thinking or behavior endorsed or observed Orientation: AAOx4 Memory/Concentration: memory, attention, language, and fund of knowledge intact  Insight: fair Judgment: fair  Interventions:  Conducted a brief chart review Provided empathic reflections and validation Reviewed content from the previous session Provided positive reinforcement Employed supportive psychotherapy interventions to facilitate reduced distress and to improve coping skills with identified stressors Psychoeducation provided regarding pleasurable activities  DSM-5 Diagnosis(es):  F50.89 Other Specified Feeding or Eating Disorder, Emotional and Binge Eating Behaviors and F33.0 Major Depressive Disorder, Recurrent Episode, Mild  Treatment Goal & Progress: During the initial appointment with this provider, the following treatment goal was established: increase coping skills. Melanie Reeves has demonstrated progress in her goal as evidenced by increased awareness of hunger patterns and increased awareness of triggers for emotional eating behaviors. Melanie Reeves also demonstrates willingness to engage in pleasurable activities.  Plan: The next appointment is scheduled for 06/23/2022  at 2pm, which will be via MyChart Video Visit. The next session will focus on reviewing pleasurable activities and working towards the established treatment goal.

## 2022-06-11 ENCOUNTER — Other Ambulatory Visit (HOSPITAL_COMMUNITY): Payer: Self-pay

## 2022-06-14 ENCOUNTER — Other Ambulatory Visit (HOSPITAL_COMMUNITY): Payer: Self-pay

## 2022-06-17 NOTE — Progress Notes (Signed)
Chief Complaint:   OBESITY Melanie Reeves is here to discuss her progress with her obesity treatment plan along with follow-up of her obesity related diagnoses. Melanie Reeves is on keeping a food journal and adhering to recommended goals of 1800-2000 calories and 100 grams of protein daily and states she is following her eating plan approximately 40-60% of the time. Melanie Reeves states she is doing some swimming for 10-15 minutes 1-2 times per week.  Today's visit was #: 24 Starting weight: 294 lbs Starting date: 11/29/2020 Today's weight: 286 lbs Today's date: 06/10/2022 Total lbs lost to date: 8 Total lbs lost since last in-office visit: 3  Interim History: Melanie Reeves has done well with weight loss. [place] She continues to work on increasing her protein and she is exercising regularly.   Subjective:   1. Vitamin D deficiency Melanie Reeves is stable on her Vitamin D, with no side effect noted.   2. Type 2 diabetes mellitus with other specified complication, with long-term current use of insulin (HCC) Melanie Reeves notes her glucose is not yet controlled. Her fasting blood sugars range in the 150's. No side effects were noted with myalgia.   3. Other depression, with emotional eating behaviors Melanie Reeves continues to work on decreasing emotional eating behaviors. She is reading a new book on how to deal with trauma.   Assessment/Plan:   1. Vitamin D deficiency We will refill prescription Vitamin D 50,000 IU every week for 1 month. Melanie Reeves will follow-up for routine testing of Vitamin D, at least 2-3 times per year to avoid over-replacement.  - Vitamin D, Ergocalciferol, (DRISDOL) 1.25 MG (50000 UNIT) CAPS capsule; Take 1 capsule by mouth every 7 days.  Dispense: 4 capsule; Refill: 0  2. Type 2 diabetes mellitus with other specified complication, with long-term current use of insulin (HCC) Melanie Reeves agreed to increase Mounjaro to 15 mg once weekly with no refills, and she will  continue her other  medications as is.   - tirzepatide (MOUNJARO) 15 MG/0.5ML Pen; Inject 15 mg into the skin once a week.  Dispense: 6 mL; Refill: 0  3. Other depression, with emotional eating behaviors Emotional eating behavior strategies were discussed, and we will continue to follow.   4. Obesity, Current BMI 54.2 Melanie Reeves is currently in the action stage of change. As such, her goal is to continue with weight loss efforts. She has agreed to keeping a food journal and adhering to recommended goals of 1800-200 calories and 100 grams of protein daily.   Exercise goals: As is.   Behavioral modification strategies: increasing lean protein intake.  Melanie Reeves has agreed to follow-up with our clinic in 3 to 4 weeks. She was informed of the importance of frequent follow-up visits to maximize her success with intensive lifestyle modifications for her multiple health conditions.   Objective:   Blood pressure (!) 148/82, pulse 90, temperature (!) 97.4 F (36.3 C), height 5\' 1"  (1.549 m), weight 286 lb (129.7 kg), SpO2 96 %. Body mass index is 54.04 kg/m.  General: Cooperative, alert, well developed, in no acute distress. HEENT: Conjunctivae and lids unremarkable. Cardiovascular: Regular rhythm.  Lungs: Normal work of breathing. Neurologic: No focal deficits.   Lab Results  Component Value Date   CREATININE 0.99 03/11/2022   BUN 40 (H) 03/11/2022   NA 140 03/11/2022   K 4.5 03/11/2022   CL 104 03/11/2022   CO2 26 03/11/2022   Lab Results  Component Value Date   ALT 27 10/16/2021  AST 32 10/16/2021   ALKPHOS 69 10/16/2021   BILITOT <0.2 10/16/2021   Lab Results  Component Value Date   HGBA1C 7.2 (H) 03/11/2022   HGBA1C 6.9 (H) 11/26/2021   HGBA1C 6.8 (H) 10/16/2021   HGBA1C 6.5 07/15/2021   HGBA1C 6.5 03/19/2021   Lab Results  Component Value Date   INSULIN 34.8 (H) 10/16/2021   Lab Results  Component Value Date   TSH 1.217 11/24/2020   Lab Results  Component Value Date   CHOL 152  10/16/2021   HDL 46 10/16/2021   LDLCALC 66 10/16/2021   LDLDIRECT 72.0 02/28/2020   TRIG 247 (H) 10/16/2021   CHOLHDL 3 07/15/2021   Lab Results  Component Value Date   VD25OH 43.1 10/16/2021   VD25OH 36.2 06/05/2021   VD25OH 19.7 (L) 11/29/2020   Lab Results  Component Value Date   WBC 13.9 (H) 08/23/2021   HGB 13.1 08/23/2021   HCT 41.4 08/23/2021   MCV 86 08/23/2021   PLT 295 08/23/2021   Lab Results  Component Value Date   FERRITIN 264 10/25/2020   Attestation Statements:   Reviewed by clinician on day of visit: allergies, medications, problem list, medical history, surgical history, family history, social history, and previous encounter notes.   I, Burt Knack, am acting as transcriptionist for Quillian Quince, MD.  I have reviewed the above documentation for accuracy and completeness, and I agree with the above. -  Quillian Quince, MD

## 2022-06-23 ENCOUNTER — Telehealth (INDEPENDENT_AMBULATORY_CARE_PROVIDER_SITE_OTHER): Payer: PPO | Admitting: Psychology

## 2022-06-24 ENCOUNTER — Ambulatory Visit: Payer: PPO | Attending: Internal Medicine | Admitting: Internal Medicine

## 2022-06-24 ENCOUNTER — Encounter: Payer: Self-pay | Admitting: Internal Medicine

## 2022-06-24 ENCOUNTER — Other Ambulatory Visit (INDEPENDENT_AMBULATORY_CARE_PROVIDER_SITE_OTHER): Payer: Self-pay | Admitting: Family Medicine

## 2022-06-24 VITALS — BP 130/64 | HR 81 | Ht 61.0 in | Wt 289.4 lb

## 2022-06-24 DIAGNOSIS — R0989 Other specified symptoms and signs involving the circulatory and respiratory systems: Secondary | ICD-10-CM | POA: Diagnosis not present

## 2022-06-24 DIAGNOSIS — Z79899 Other long term (current) drug therapy: Secondary | ICD-10-CM | POA: Diagnosis not present

## 2022-06-24 DIAGNOSIS — E1169 Type 2 diabetes mellitus with other specified complication: Secondary | ICD-10-CM

## 2022-06-24 DIAGNOSIS — R0609 Other forms of dyspnea: Secondary | ICD-10-CM

## 2022-06-24 DIAGNOSIS — I480 Paroxysmal atrial fibrillation: Secondary | ICD-10-CM | POA: Diagnosis not present

## 2022-06-24 DIAGNOSIS — Z6841 Body Mass Index (BMI) 40.0 and over, adult: Secondary | ICD-10-CM | POA: Diagnosis not present

## 2022-06-24 DIAGNOSIS — I509 Heart failure, unspecified: Secondary | ICD-10-CM | POA: Diagnosis not present

## 2022-06-24 DIAGNOSIS — I10 Essential (primary) hypertension: Secondary | ICD-10-CM | POA: Diagnosis not present

## 2022-06-24 DIAGNOSIS — E782 Mixed hyperlipidemia: Secondary | ICD-10-CM | POA: Diagnosis not present

## 2022-06-24 DIAGNOSIS — Z794 Long term (current) use of insulin: Secondary | ICD-10-CM | POA: Diagnosis not present

## 2022-06-24 DIAGNOSIS — E119 Type 2 diabetes mellitus without complications: Secondary | ICD-10-CM | POA: Diagnosis not present

## 2022-06-24 DIAGNOSIS — I5032 Chronic diastolic (congestive) heart failure: Secondary | ICD-10-CM | POA: Diagnosis not present

## 2022-06-24 DIAGNOSIS — F5101 Primary insomnia: Secondary | ICD-10-CM | POA: Diagnosis not present

## 2022-06-24 NOTE — Progress Notes (Signed)
Cardiology Office Note   Date:  06/24/2022   ID:  Melanie Reeves, DOB 06-04-1951, MRN [id number]  PCP:  Eartha Inch, MD  Cardiologist:   Dietrich Pates, MD    F/U for atrial fbrillation, HTN    History of Present Illness: Melanie Reeves is a 71 y.o. female with a history of HTN, diastolic CHF,  PAF, DMII, HL, OSA, LBBB, morbid obesity   Myovue in 2018 was normal  Echo in Feb 2022 done showing normal LVEF and mild diastolic dysfunction   I saw the pt in June 2023  The pt denies palpitations.  She denies CP    She has a cough   Now productive of yellow/green sputum     Denies fevers    Current Meds  Medication Sig   amLODipine (NORVASC) 5 MG tablet Take 1 tablet (5 mg total) by mouth daily.   atenolol (TENORMIN) 25 MG tablet Take 1 tablet (25 mg total) by mouth daily.   B-D ULTRAFINE III SHORT PEN 31G X 8 MM MISC USE TO INJECT MEDICATION 5 TIMES DAILY.   Blood Glucose Monitoring Suppl (ONETOUCH VERIO) w/Device KIT UAD to monitor glucose tid; Dx: E11.65   canagliflozin (INVOKANA) 300 MG TABS tablet Take 1 tablet (300 mg total) by mouth daily before breakfast.   Continuous Blood Gluc Sensor (FREESTYLE LIBRE 3 SENSOR) MISC Place 1 sensor on the skin every 14 days. Use to check glucose continuously   cyclobenzaprine (FLEXERIL) 5 MG tablet Take 5 mg by mouth 3 (three) times daily as needed for muscle spasms.   diphenhydrAMINE (BENADRYL) 25 mg capsule Take 50 mg by mouth at bedtime as needed for sleep.   DULoxetine (CYMBALTA) 30 MG capsule TAKE 1 CAPSULE BY MOUTH DAILY. TAKE IN ADDITION TO THE 60 MG   DULoxetine (CYMBALTA) 60 MG capsule Take 60 mg by mouth daily. Taking in the AM   fenofibrate (TRICOR) 145 MG tablet TAKE 1 TABLET BY MOUTH EVERY DAY   glucose blood (ONETOUCH ULTRA) test strip USE AS INSTRUCTED TO CHECK BLOOD SUGAR THREE TIMES DAILY.   HYDROcodone-acetaminophen (NORCO/VICODIN) 5-325 MG tablet Take 1 tablet by mouth every 6 (six) hours as needed for moderate pain.    ibuprofen (ADVIL) 200 MG tablet Take 400 mg by mouth every 6 (six) hours as needed for fever.   insulin aspart (NOVOLOG FLEXPEN) 100 UNIT/ML FlexPen Inject 20-40 Units into the skin 2 (two) times daily with a meal.   INVOKANA 300 MG TABS tablet TAKE 1 TABLET (300 MG TOTAL) BY MOUTH DAILY BEFORE BREAKFAST.   loratadine (CLARITIN) 10 MG tablet Take 10 mg by mouth daily.   metFORMIN (GLUCOPHAGE-XR) 500 MG 24 hr tablet TAKE 4 TABLETS (2,000 MG TOTAL) BY MOUTH DAILY WITH SUPPER.   Omega-3 Fatty Acids (FISH OIL) 1000 MG CAPS Take 2,000 mg by mouth daily.   pravastatin (PRAVACHOL) 40 MG tablet TAKE 1 TABLET BY MOUTH EVERY DAY   pregabalin (LYRICA) 100 MG capsule Take 1 capsule (100 mg total) by mouth 2 (two) times daily.   tirzepatide (MOUNJARO) 15 MG/0.5ML Pen Inject 15 mg into the skin once a week.   TOUJEO MAX SOLOSTAR 300 UNIT/ML Solostar Pen INJECT 140 UNITS INTO THE SKIN DAILY. . (Patient taking differently: 125 Units.)   Vitamin D, Ergocalciferol, (DRISDOL) 1.25 MG (50000 UNIT) CAPS capsule Take 1 capsule by mouth every 7 days.   XARELTO 20 MG TABS tablet TAKE 1 TABLET BY MOUTH DAILY WITH SUPPER     Allergies:  Simvastatin and Sulfa antibiotics   Past Medical History:  Diagnosis Date   Abnormal electrocardiogram 08/14/2014   LBBB   Anemia    Anxiety    Arthritis    Atrial fibrillation (HCC)    Bilateral swelling of feet    Chest pain    Constipation    Depression    Diabetes mellitus ([place])    Difficulty walking    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   Qualifier: Diagnosis of  By: Aline Brochure MD, [place]     Dyspnea 08/14/2014   Edema    Gallbladder problem    GERD (gastroesophageal reflux disease)    Hyperlipidemia    Hypertension    LOW BACK PAIN 12/20/2009   Qualifier: Diagnosis of  By: Aline Brochure MD, Stanley     Morbid obesity ([place])    OSA (obstructive sleep apnea) 08/16/2014   Osteoarthritis    Other fatigue    Palpitations    Seasonal allergies    Shortness of breath     Shortness of breath on exertion    Sinusitis    SPINAL STENOSIS 12/20/2009   Qualifier: Diagnosis of  By: Aline Brochure MD, Stanley     Stage 3 chronic kidney disease [facility])     Past Surgical History:  Procedure Laterality Date   CHOLECYSTECTOMY     COLONOSCOPY WITH PROPOFOL N/A 11/29/2014   Procedure: COLONOSCOPY WITH PROPOFOL;  Surgeon: Arta Silence, MD;  Location: [facility];  Service: Endoscopy;  Laterality: N/A;   SKIN CANCER EXCISION     TONSILLECTOMY       Social History:  The patient  reports that she quit smoking about 28 years ago. Her smoking use included cigarettes. She has a 40.00 pack-year smoking history. She has quit using smokeless tobacco. She reports current alcohol use. She reports that she does not use drugs.   Family History:  The patient's family history includes Anxiety disorder in her father and mother; Bipolar disorder in her mother; Cancer in her father; Cancer - Lung in her father; Depression in her father and mother; Diabetes in her father; Eating disorder in her mother; Heart disease in her father; Hyperlipidemia in her father and mother; Hypertension in her father and mother; Kidney disease in her mother; Obesity in her mother; Stroke in her mother.    ROS:  Please see the history of present illness. All other systems are reviewed and  Negative to the above problem except as noted.    PHYSICAL EXAM: VS:  BP 130/64   Pulse 81   Ht $R'5\' 1"'pM$  (1.549 m)   Wt 289 lb 6.4 oz (131.3 kg)   SpO2 91%   BMI 54.68 kg/m   GEN:  Morbidly obese 71 yo , in no acute distress  HEENT: normal  Neck: Neck is full  No obvious JVD Cardiac:RRR S1, S2   No murmurs  Triv LE   edema   Respiratory:  Mild rhonchi that clear with cough   CTA GI: soft, nontender, nondistended, + BS  No hepatomegaly  MS: no deformity Moving all extremities   Skin: warm and dry, no rash Neuro:  Strength and sensation are intact    EKG:  EKG is not  ordered today.     11/2020 Echo  1. Left  ventricular ejection fraction, by estimation, is 55 to 60%. The left ventricle has normal function. The left ventricle has no regional wall motion abnormalities. There is moderate concentric left ventricular hypertrophy. Left ventricular diastolic parameters are consistent with Grade I diastolic dysfunction (  impaired relaxation). 2. Right ventricular systolic function is normal. The right ventricular size is normal. There is normal pulmonary artery systolic pressure. 3. The mitral valve is normal in structure. Mild mitral valve regurgitation. No evidence of mitral stenosis. 4. The aortic valve is tricuspid. Aortic valve regurgitation is not visualized. No aortic stenosis is present. Aortic valve mean gradient measures 9.0 mmHg. 5. The inferior vena cava is normal in size with greater than 50% respiratory variability, suggesting right atrial pressure of 3 mmHg. Comparison(s): A prior study was performed on 09/20/2017. No significant change from prior study. Prior images reviewed side by side.  Lipid Panel    Component Value Date/Time   CHOL 152 10/16/2021 1247   TRIG 247 (H) 10/16/2021 1247   HDL 46 10/16/2021 1247   CHOLHDL 3 07/15/2021 1054   VLDL 30.6 07/15/2021 1054   LDLCALC 66 10/16/2021 1247   LDLDIRECT 72.0 02/28/2020 1124      Wt Readings from Last 3 Encounters:  06/24/22 289 lb 6.4 oz (131.3 kg)  06/10/22 286 lb (129.7 kg)  05/05/22 289 lb (131.1 kg)      ASSESSMENT AND PLAN: 1 PAF   Found on event monitor Had some when hosp with COVID  Denies symptoms  Pt's CHADS2VASc is 3 / 4     Continue Xarelto      2  HTN  BP is good on current regimen  3 Chronic diastolic CHF  Volume assessment difficult due to body habitus    Will get BNP   Overall does not appear bad   4  LBBB  No ischemia on myovue in 2018  5   DM  Follows with endocrinology   A1C in May 7.2      6.   LIpid  Last LDL 66  HDL 46  Trig 247   watch carbs     7  Cough  Pt has appt with PCP today   Will  check CBC     Plan for f/u of BP in Aug  Current medicines are reviewed at length with the patient today.  The patient does not have concerns regarding medicines.  Signed, Dorris Carnes, MD  06/24/2022 10:51 PM    [address], [place], [place]  [address] Phone: [phone]; Fax: [phone]

## 2022-06-24 NOTE — Patient Instructions (Signed)
Medication Instructions:   *If you need a refill on your cardiac medications before your next appointment, please call your pharmacy*   Lab Work: CBC, BMET, PRO BNP  If you have labs (blood work) drawn today and your tests are completely normal, you will receive your results only by: MyChart Message (if you have MyChart) OR A paper copy in the mail If you have any lab test that is abnormal or we need to change your treatment, we will call you to review the results.   Testing/Procedures:    Follow-Up: At [facility], you and your health needs are our priority.  As part of our continuing mission to provide you with exceptional heart care, we have created designated Provider Care Teams.  These Care Teams include your primary Cardiologist (physician) and Advanced Practice Providers (APPs -  Physician Assistants and Nurse Practitioners) who all work together to provide you with the care you need, when you need it.  We recommend signing up for the patient portal called "MyChart".  Sign up information is provided on this After Visit Summary.  MyChart is used to connect with patients for Virtual Visits (Telemedicine).  Patients are able to view lab/test results, encounter notes, upcoming appointments, etc.  Non-urgent messages can be sent to your provider as well.   To learn more about what you can do with MyChart, go to [email or web address].    Your next appointment:   8 month(s)  The format for your next appointment:   In Person  Provider:   Dietrich Pates, MD     Other Instructions   Important Information About Sugar

## 2022-06-25 LAB — BASIC METABOLIC PANEL
BUN/Creatinine Ratio: 36 — ABNORMAL HIGH (ref 12–28)
BUN: 32 mg/dL — ABNORMAL HIGH (ref 8–27)
CO2: 18 mmol/L — ABNORMAL LOW (ref 20–29)
Calcium: 10.7 mg/dL — ABNORMAL HIGH (ref 8.7–10.3)
Chloride: 103 mmol/L (ref 96–106)
Creatinine, Ser: 0.9 mg/dL (ref 0.57–1.00)
Glucose: 126 mg/dL — ABNORMAL HIGH (ref 70–99)
Potassium: 4.5 mmol/L (ref 3.5–5.2)
Sodium: 140 mmol/L (ref 134–144)
eGFR: 69 mL/min/{1.73_m2} (ref >59)

## 2022-06-25 LAB — CBC
Hematocrit: 40.5 % (ref 34.0–46.6)
Hemoglobin: 12.9 g/dL (ref 11.1–15.9)
MCH: 26.7 pg (ref 26.6–33.0)
MCHC: 31.9 g/dL (ref 31.5–35.7)
MCV: 84 fL (ref 79–97)
Platelets: 245 10*3/uL (ref 150–450)
RBC: 4.83 x10E6/uL (ref 3.77–5.28)
RDW: 14.7 % (ref 11.7–15.4)
WBC: 11.3 10*3/uL — ABNORMAL HIGH (ref 3.4–10.8)

## 2022-06-25 LAB — PRO B NATRIURETIC PEPTIDE: NT-Pro BNP: 86 pg/mL (ref 0–301)

## 2022-06-25 MED ORDER — FREESTYLE LIBRE 3 SENSOR MISC: 0 refills | Status: DC

## 2022-06-25 NOTE — Telephone Encounter (Signed)
Please advise about refill

## 2022-06-30 ENCOUNTER — Telehealth: Payer: Self-pay

## 2022-06-30 ENCOUNTER — Telehealth (INDEPENDENT_AMBULATORY_CARE_PROVIDER_SITE_OTHER): Payer: PPO | Admitting: Family Medicine

## 2022-06-30 DIAGNOSIS — I5032 Chronic diastolic (congestive) heart failure: Secondary | ICD-10-CM

## 2022-06-30 DIAGNOSIS — Z79899 Other long term (current) drug therapy: Secondary | ICD-10-CM

## 2022-06-30 DIAGNOSIS — I480 Paroxysmal atrial fibrillation: Secondary | ICD-10-CM

## 2022-06-30 NOTE — Telephone Encounter (Signed)
Pt to have a repeat Bmet 07/23/2022.Marland KitchenMarland Kitchen

## 2022-06-30 NOTE — Telephone Encounter (Signed)
-----   Message from Fay Records, MD sent at 06/25/2022  1:26 PM EDT ----- CBC is OK Electrolytes   CO2 slightly low   Ca slightly elevated   Reepat BMET in 1 month FLuid overall OK

## 2022-07-01 DIAGNOSIS — Z794 Long term (current) use of insulin: Secondary | ICD-10-CM | POA: Diagnosis not present

## 2022-07-01 DIAGNOSIS — E1140 Type 2 diabetes mellitus with diabetic neuropathy, unspecified: Secondary | ICD-10-CM | POA: Diagnosis not present

## 2022-07-01 NOTE — Progress Notes (Unsigned)
TeleHealth Visit:  This visit was completed with telemedicine (audio/video) technology. Melanie Reeves has verbally consented to this TeleHealth visit. The patient is located at home, the provider is located at home. The participants in this visit include the listed provider and patient. The visit was conducted today via MyChart video.  OBESITY Melanie Reeves is here to discuss her progress with her obesity treatment plan along with follow-up of her obesity related diagnoses.   Today's visit was # 25 Starting weight: 294 lbs Starting date: 11/29/2020 Weight at last in office visit: 286 lbs on 06/10/22 Total weight loss: 8 lbs at last in office visit on 06/10/22. Today's reported weight: No weight reported.  Nutrition Plan: keeping a food journal and adhering to recommended goals of 1800-2000 calories and 100 gms protein daily   Current exercise: none  Interim History: Melanie Reeves is feeling much more hopeful about her ability to make changes and improve her health.  Says that she knows that she can improve her diabetes by making good food choices. She has been encouraged by her recent weight loss.  Mounjaro dose increased to 15 mg weekly last office visit and has helped a great deal with appetite and portion size. She has been at the [facility] recently and did not eat very well while she was there.  She still tends to skip lunch.  She has not been journaling much. She wants to check into joining [employer] sneakers to use the pool.  Assessment/Plan:  1. Type II Diabetes with hyperglycemia with long term use of insulin HgbA1c is at goal. Last A1c was 6.9 on 05/12/2022 (care everywhere).  She had her first visit with her new endocrinologist yesterday-Dr. Hartford Poli at [facility].  He discontinued meal coverage with NovoLog.  Reduced Toujeo from 125 units to 100 units daily.  He combined Invokana and metformin into Invokamet. CBGs: Fasting low-mid 100s, 2 hour PP rarely go above 200 . Has [employer]. CBGs 96% in  target.  She recently fell and broke her libre sensor. Episodes of hypoglycemia: no Medication(s): Toujeo 100 units daily, Invokamet XR 150/1000 two tablets with supper, Mounjaro 15 mg weekly   Lab Results  Component Value Date   HGBA1C 7.2 (H) 03/11/2022   HGBA1C 6.9 (H) 11/26/2021   HGBA1C 6.8 (H) 10/16/2021   Lab Results  Component Value Date   MICROALBUR <0.7 07/15/2021   LDLCALC 66 10/16/2021   CREATININE 0.90 06/24/2022    Plan: Continue all medicines as directed by Dr. Hartford Poli.  Follow-up with Dr. Hartford Poli in March 2024. Refill freestyle libre sensor.  2. Vitamin D Deficiency Vitamin D is slightly below goal of 50-last vitamin D was 43.1 on 10/16/2021. She is on weekly prescription Vitamin D 50,000 IU.  Lab Results  Component Value Date   VD25OH 43.1 10/16/2021   VD25OH 36.2 06/05/2021   VD25OH 19.7 (L) 11/29/2020    Plan: Refill prescription vitamin D 50,000 IU weekly. Check vitamin D within the next 2 months.   3. Obesity: Current BMI 54.1 Melanie Reeves is currently in the action stage of change. As such, her goal is to continue with weight loss efforts.  She has agreed to keeping a food journal and adhering to recommended goals of 1800-2000 calories and 100 gms protein daily .   She will journal more frequently.  Exercise goals: She will check into joining [employer] sneakers. Also encouraged her to check into [employer].  Behavioral modification strategies: increasing lean protein intake, decreasing simple carbohydrates, no skipping meals, and meal planning and cooking  strategies.  Melanie Reeves has agreed to follow-up with our clinic in 2 weeks.   No orders of the defined types were placed in this encounter.   Medications Discontinued During This Encounter  Medication Reason   insulin aspart (NOVOLOG FLEXPEN) 100 UNIT/ML FlexPen    canagliflozin (INVOKANA) 300 MG TABS tablet    metFORMIN (GLUCOPHAGE-XR) 500 MG 24 hr tablet Discontinued by provider   Continuous Blood Gluc  Sensor (FREESTYLE LIBRE 3 SENSOR) MISC Reorder   Vitamin D, Ergocalciferol, (DRISDOL) 1.25 MG (50000 UNIT) CAPS capsule Reorder     Meds ordered this encounter  Medications   Continuous Blood Gluc Sensor (FREESTYLE LIBRE 3 SENSOR) MISC    Sig: Place 1 sensor on the skin every 14 days. Use to check glucose continuously    Dispense:  2 each    Refill:  0    Order Specific Question:   Supervising Provider    Answer:   Netty Starring   Vitamin D, Ergocalciferol, (DRISDOL) 1.25 MG (50000 UNIT) CAPS capsule    Sig: Take 1 capsule by mouth every 7 days.    Dispense:  4 capsule    Refill:  0    Order Specific Question:   Supervising Provider    Answer:   Dell Ponto [[id number]]      Objective:   VITALS: Per patient if applicable, see vitals. GENERAL: Alert and in no acute distress. CARDIOPULMONARY: No increased WOB. Speaking in clear sentences.  PSYCH: Pleasant and cooperative. Speech normal rate and rhythm. Affect is appropriate. Insight and judgement are appropriate. Attention is focused, linear, and appropriate.  NEURO: Oriented as arrived to appointment on time with no prompting.   Lab Results  Component Value Date   CREATININE 0.90 06/24/2022   BUN 32 (H) 06/24/2022   NA 140 06/24/2022   K 4.5 06/24/2022   CL 103 06/24/2022   CO2 18 (L) 06/24/2022   Lab Results  Component Value Date   ALT 27 10/16/2021   AST 32 10/16/2021   ALKPHOS 69 10/16/2021   BILITOT <0.2 10/16/2021   Lab Results  Component Value Date   HGBA1C 7.2 (H) 03/11/2022   HGBA1C 6.9 (H) 11/26/2021   HGBA1C 6.8 (H) 10/16/2021   HGBA1C 6.5 07/15/2021   HGBA1C 6.5 03/19/2021   Lab Results  Component Value Date   INSULIN 34.8 (H) 10/16/2021   Lab Results  Component Value Date   TSH 1.217 11/24/2020   Lab Results  Component Value Date   CHOL 152 10/16/2021   HDL 46 10/16/2021   LDLCALC 66 10/16/2021   LDLDIRECT 72.0 02/28/2020   TRIG 247 (H) 10/16/2021   CHOLHDL 3 07/15/2021   Lab  Results  Component Value Date   WBC 11.3 (H) 06/24/2022   HGB 12.9 06/24/2022   HCT 40.5 06/24/2022   MCV 84 06/24/2022   PLT 245 06/24/2022   Lab Results  Component Value Date   FERRITIN 264 10/25/2020   Lab Results  Component Value Date   VD25OH 43.1 10/16/2021   VD25OH 36.2 06/05/2021   VD25OH 19.7 (L) 11/29/2020    Attestation Statements:   Reviewed by clinician on day of visit: allergies, medications, problem list, medical history, surgical history, family history, social history, and previous encounter notes.

## 2022-07-02 ENCOUNTER — Telehealth (INDEPENDENT_AMBULATORY_CARE_PROVIDER_SITE_OTHER): Payer: PPO | Admitting: Family Medicine

## 2022-07-02 ENCOUNTER — Encounter (INDEPENDENT_AMBULATORY_CARE_PROVIDER_SITE_OTHER): Payer: Self-pay | Admitting: Family Medicine

## 2022-07-02 DIAGNOSIS — E559 Vitamin D deficiency, unspecified: Secondary | ICD-10-CM

## 2022-07-02 DIAGNOSIS — E1169 Type 2 diabetes mellitus with other specified complication: Secondary | ICD-10-CM

## 2022-07-02 DIAGNOSIS — Z794 Long term (current) use of insulin: Secondary | ICD-10-CM

## 2022-07-02 DIAGNOSIS — E669 Obesity, unspecified: Secondary | ICD-10-CM | POA: Diagnosis not present

## 2022-07-02 DIAGNOSIS — Z6841 Body Mass Index (BMI) 40.0 and over, adult: Secondary | ICD-10-CM

## 2022-07-02 MED ORDER — FREESTYLE LIBRE 3 SENSOR MISC: 0 refills | Status: DC

## 2022-07-02 MED ORDER — VITAMIN D (ERGOCALCIFEROL) 1.25 MG (50000 UNIT) PO CAPS: 50000.0000 [IU] | ORAL_CAPSULE | ORAL | 0 refills | Status: DC

## 2022-07-08 ENCOUNTER — Other Ambulatory Visit (INDEPENDENT_AMBULATORY_CARE_PROVIDER_SITE_OTHER): Payer: Self-pay | Admitting: Family Medicine

## 2022-07-08 DIAGNOSIS — E559 Vitamin D deficiency, unspecified: Secondary | ICD-10-CM

## 2022-07-14 ENCOUNTER — Ambulatory Visit (INDEPENDENT_AMBULATORY_CARE_PROVIDER_SITE_OTHER): Payer: PPO | Admitting: Family Medicine

## 2022-07-14 ENCOUNTER — Encounter (INDEPENDENT_AMBULATORY_CARE_PROVIDER_SITE_OTHER): Payer: Self-pay | Admitting: Family Medicine

## 2022-07-14 VITALS — BP 146/73 | HR 97 | Temp 97.9°F | Ht 61.0 in | Wt 284.0 lb

## 2022-07-14 DIAGNOSIS — Z6841 Body Mass Index (BMI) 40.0 and over, adult: Secondary | ICD-10-CM | POA: Diagnosis not present

## 2022-07-14 DIAGNOSIS — E669 Obesity, unspecified: Secondary | ICD-10-CM | POA: Diagnosis not present

## 2022-07-14 DIAGNOSIS — E1169 Type 2 diabetes mellitus with other specified complication: Secondary | ICD-10-CM | POA: Diagnosis not present

## 2022-07-14 DIAGNOSIS — I10 Essential (primary) hypertension: Secondary | ICD-10-CM

## 2022-07-14 DIAGNOSIS — Z794 Long term (current) use of insulin: Secondary | ICD-10-CM

## 2022-07-14 DIAGNOSIS — Z7985 Long-term (current) use of injectable non-insulin antidiabetic drugs: Secondary | ICD-10-CM

## 2022-07-14 MED ORDER — TIRZEPATIDE 15 MG/0.5ML ~~LOC~~ SOAJ: 15.0000 mg | SUBCUTANEOUS | 0 refills | Status: DC

## 2022-07-15 NOTE — Progress Notes (Signed)
Chief Complaint:   OBESITY Melanie Reeves is here to discuss her progress with her obesity treatment plan along with follow-up of her obesity related diagnoses. Melanie Reeves is on keeping a food journal and adhering to recommended goals of 1800-2000 calories and 100 grams of protein daily and states she is following her eating plan approximately 80% of the time. Melanie Reeves states she is doing chair exercises for 10 minutes 1 time per week.  Today's visit was #: 26 Starting weight: 294 lbs Starting date: 11/29/2020 Today's weight: 234 lbs Today's date: 07/14/2022 Total lbs lost to date: 60 Total lbs lost since last in-office visit: 2  Interim History: Melanie Reeves has done well overall with weight loss.  Her hunger is controlled, and she is doing well with trying to decrease snacks and increase protein.  She notes some vacation eating, but she did well overall.  She notes decreased cravings overall on Mounjaro, and she is pleased with her progress.  Subjective:   1. Type 2 diabetes mellitus with other specified complication, with long-term current use of insulin (Melanie Reeves) Melanie Reeves taking Mounjaro 50 mg once weekly.  She notes decrease in hunger and decreased cravings.  She denies hypoglycemia.  She continues to see Endocrinology at [facility] and she has decreased Melanie Reeves to 100 units.  She is taking Invokana. She is taking all medications without side effects.  2. Essential hypertension Melanie Reeves's bood pressure is not quite at goal. Her PCP is following.   Assessment/Plan:   1. Type 2 diabetes mellitus with other specified complication, with long-term current use of insulin (HCC) Melanie Reeves will continue Melanie Reeves, Invokana, and Mounjaro; and we will refill Mounjaro 15 mg once weekly for 1 month.  She will continue with her diet and exercise and she will follow-up with her PCP and Endocrinologist.  - tirzepatide (MOUNJARO) 15 MG/0.5ML Pen; Inject 15 mg into the skin once a week.  Dispense: 6 mL; Refill: 0  2. Essential  hypertension Melanie Reeves will continue with her diet, exercise, and decrease her sodium in her diet.   3. Obesity, Current BMI 53.7 Melanie Reeves is currently in the action stage of change. As such, her goal is to continue with weight loss efforts. She has agreed to keeping a food journal and adhering to recommended goals of 1800-2000 calories and 100 grams of protein daily.   Exercise goals: As is.   Behavioral modification strategies: increasing lean protein intake, decreasing simple carbohydrates, better snacking choices, and travel eating strategies.  Melanie Reeves has agreed to follow-up with our clinic in 2 weeks via virtual with [facility], FNP-C, and in 3 weeks in the office. She was informed of the importance of frequent follow-up visits to maximize her success with intensive lifestyle modifications for her multiple health conditions.   Objective:   Blood pressure (!) 146/73, pulse 97, temperature 97.9 F (36.6 C), height 5\' 1"  (1.549 m), weight 284 lb (128.8 kg), SpO2 94 %. Body mass index is 53.66 kg/m.  General: Cooperative, alert, well developed, in no acute distress. HEENT: Conjunctivae and lids unremarkable. Cardiovascular: Regular rhythm.  Lungs: Normal work of breathing. Neurologic: No focal deficits.   Lab Results  Component Value Date   CREATININE 0.90 06/24/2022   BUN 32 (H) 06/24/2022   NA 140 06/24/2022   K 4.5 06/24/2022   CL 103 06/24/2022   CO2 18 (L) 06/24/2022   Lab Results  Component Value Date   ALT 27 10/16/2021   AST 32 10/16/2021   ALKPHOS 69 10/16/2021   BILITOT <  0.2 10/16/2021   Lab Results  Component Value Date   HGBA1C 7.2 (H) 03/11/2022   HGBA1C 6.9 (H) 11/26/2021   HGBA1C 6.8 (H) 10/16/2021   HGBA1C 6.5 07/15/2021   HGBA1C 6.5 03/19/2021   Lab Results  Component Value Date   INSULIN 34.8 (H) 10/16/2021   Lab Results  Component Value Date   TSH 1.217 11/24/2020   Lab Results  Component Value Date   CHOL 152 10/16/2021   HDL 46 10/16/2021    LDLCALC 66 10/16/2021   LDLDIRECT 72.0 02/28/2020   TRIG 247 (H) 10/16/2021   CHOLHDL 3 07/15/2021   Lab Results  Component Value Date   VD25OH 43.1 10/16/2021   VD25OH 36.2 06/05/2021   VD25OH 19.7 (L) 11/29/2020   Lab Results  Component Value Date   WBC 11.3 (H) 06/24/2022   HGB 12.9 06/24/2022   HCT 40.5 06/24/2022   MCV 84 06/24/2022   PLT 245 06/24/2022   Lab Results  Component Value Date   FERRITIN 264 10/25/2020   Attestation Statements:   Reviewed by clinician on day of visit: allergies, medications, problem list, medical history, surgical history, family history, social history, and previous encounter notes.   I, Trixie Dredge, am acting as transcriptionist for Dennard Nip, MD.  I have reviewed the above documentation for accuracy and completeness, and I agree with the above. -  Dennard Nip, MD

## 2022-07-23 ENCOUNTER — Ambulatory Visit: Payer: PPO | Attending: Internal Medicine

## 2022-07-23 DIAGNOSIS — I480 Paroxysmal atrial fibrillation: Secondary | ICD-10-CM | POA: Diagnosis not present

## 2022-07-23 DIAGNOSIS — Z79899 Other long term (current) drug therapy: Secondary | ICD-10-CM

## 2022-07-23 DIAGNOSIS — I5032 Chronic diastolic (congestive) heart failure: Secondary | ICD-10-CM

## 2022-07-24 ENCOUNTER — Other Ambulatory Visit: Payer: Self-pay | Admitting: Internal Medicine

## 2022-07-24 LAB — BASIC METABOLIC PANEL
BUN/Creatinine Ratio: 50 — ABNORMAL HIGH (ref 12–28)
BUN: 44 mg/dL — ABNORMAL HIGH (ref 8–27)
CO2: 18 mmol/L — ABNORMAL LOW (ref 20–29)
Calcium: 9.9 mg/dL (ref 8.7–10.3)
Chloride: 106 mmol/L (ref 96–106)
Creatinine, Ser: 0.88 mg/dL (ref 0.57–1.00)
Glucose: 119 mg/dL — ABNORMAL HIGH (ref 70–99)
Potassium: 4.1 mmol/L (ref 3.5–5.2)
Sodium: 141 mmol/L (ref 134–144)
eGFR: 71 mL/min/{1.73_m2} (ref >59)

## 2022-07-24 NOTE — Progress Notes (Signed)
TeleHealth Visit:  This visit was completed with telemedicine (audio/video) technology. Melanie Reeves has verbally consented to this TeleHealth visit. The patient is located at home, the provider is located at home. The participants in this visit include the listed provider and patient. The visit was conducted today via MyChart video.  Melanie Reeves is here to discuss her progress with her Melanie treatment plan along with follow-up of her Melanie related diagnoses.   Today's visit was # 27 Starting weight: 294 lbs Starting date: 11/29/2020 Weight at last in office visit: 284 lbs on 07/14/22 Total weight loss: 10 lbs at last in office visit on 07/14/22. Today's reported weight:  No weight reported.  Nutrition Plan: keeping a food journal and adhering to recommended goals of 1800-2000 calories and 100 gms protein daily  Current exercise: none    Interim History: Melanie Reeves has not been journaling.  She is trying to eat smaller portions and make healthier choices.  She says her sweets intake has decreased and she no longer craves sweets like she used to.  She denies intake of sugar sweetened beverages.  She generally drinks Crystal light.  Food recall: Breakfast-3 eggs and sausage Lunch-generally skips but may have some cheese sticks (usually has about 6 cheese sticks throughout the day, some light and some regular). Dinner-meat and vegetable.  Eats out about twice a week.  She says she still has the desire to snack throughout the day.  She sleeps late and tends to eat more in the evenings.  Assessment/Plan:  1. Type II Diabetes with hyperglycemia with long-term use of insulin. HgbA1c is not at goal. Last A1c was 7.2 on 03/11/2022.  Managed by endocrinology (Dr. Hartford Poli). CBGs: 95% in range. High has been 170. Episodes of hypoglycemia: yes - had a low of 52 last week. This is rare. Medication(s): Toujeo 100 units daily,  canagliflozin/metformin 150/1000 mg x 2 daily at supper, Mounjaro 15 mg  weekly.  Lab Results  Component Value Date   HGBA1C 7.2 (H) 03/11/2022   HGBA1C 6.9 (H) 11/26/2021   HGBA1C 6.8 (H) 10/16/2021   Lab Results  Component Value Date   MICROALBUR <0.7 07/15/2021   LDLCALC 66 10/16/2021   CREATININE 0.88 07/23/2022    Plan: Continue Toujeo 100 units daily, canagliflozin/metformin 150/1000 mg 2 tablets with supper, and Mounjaro 15 mg weekly. Follow-up with Dr. Hartford Poli as directed-next appointment is December 30, 2022.   2. Vitamin D Deficiency Vitamin D is not at goal of 50.  Last vitamin D was 43.1 on 10/16/2021. She is on weekly prescription Vitamin D 50,000 IU.  Lab Results  Component Value Date   VD25OH 43.1 10/16/2021   VD25OH 36.2 06/05/2021   VD25OH 19.7 (L) 11/29/2020    Plan: Refill prescription vitamin D 50,000 IU weekly.   3. Melanie: Current BMI 53.7 Melanie Reeves is currently in the action stage of change. As such, her goal is to continue with weight loss efforts.  She has agreed to keeping a food journal and adhering to recommended goals of 1800-2000 calories and 100 gms protein daily.  1.  Discussed low-carb plan briefly but she prefers to be able to have some carbs if desired. 2.  She will drive to journal at least 3 to 4 days/week.  Exercise goals: chair exercises for 10 minutes 3 time per week.  Behavioral modification strategies: increasing lean protein intake and decreasing simple carbohydrates.  Melanie Reeves has agreed to follow-up with our clinic in 4 weeks.   No orders of the defined types  were placed in this encounter.   Medications Discontinued During This Encounter  Medication Reason   Vitamin D, Ergocalciferol, (DRISDOL) 1.25 MG (50000 UNIT) CAPS capsule Reorder     Meds ordered this encounter  Medications   Vitamin D, Ergocalciferol, (DRISDOL) 1.25 MG (50000 UNIT) CAPS capsule    Sig: Take 1 capsule by mouth every 7 days.    Dispense:  4 capsule    Refill:  0    Order Specific Question:   Supervising Provider    Answer:    Dell Ponto [[id number]]      Objective:   VITALS: Per patient if applicable, see vitals. GENERAL: Alert and in no acute distress. CARDIOPULMONARY: No increased WOB. Speaking in clear sentences.  PSYCH: Pleasant and cooperative. Speech normal rate and rhythm. Affect is appropriate. Insight and judgement are appropriate. Attention is focused, linear, and appropriate.  NEURO: Oriented as arrived to appointment on time with no prompting.   Lab Results  Component Value Date   CREATININE 0.88 07/23/2022   BUN 44 (H) 07/23/2022   NA 141 07/23/2022   K 4.1 07/23/2022   CL 106 07/23/2022   CO2 18 (L) 07/23/2022   Lab Results  Component Value Date   ALT 27 10/16/2021   AST 32 10/16/2021   ALKPHOS 69 10/16/2021   BILITOT <0.2 10/16/2021   Lab Results  Component Value Date   HGBA1C 7.2 (H) 03/11/2022   HGBA1C 6.9 (H) 11/26/2021   HGBA1C 6.8 (H) 10/16/2021   HGBA1C 6.5 07/15/2021   HGBA1C 6.5 03/19/2021   Lab Results  Component Value Date   INSULIN 34.8 (H) 10/16/2021   Lab Results  Component Value Date   TSH 1.217 11/24/2020   Lab Results  Component Value Date   CHOL 152 10/16/2021   HDL 46 10/16/2021   LDLCALC 66 10/16/2021   LDLDIRECT 72.0 02/28/2020   TRIG 247 (H) 10/16/2021   CHOLHDL 3 07/15/2021   Lab Results  Component Value Date   WBC 11.3 (H) 06/24/2022   HGB 12.9 06/24/2022   HCT 40.5 06/24/2022   MCV 84 06/24/2022   PLT 245 06/24/2022   Lab Results  Component Value Date   FERRITIN 264 10/25/2020   Lab Results  Component Value Date   VD25OH 43.1 10/16/2021   VD25OH 36.2 06/05/2021   VD25OH 19.7 (L) 11/29/2020    Attestation Statements:   Reviewed by clinician on day of visit: allergies, medications, problem list, medical history, surgical history, family history, social history, and previous encounter notes.

## 2022-07-28 ENCOUNTER — Telehealth (INDEPENDENT_AMBULATORY_CARE_PROVIDER_SITE_OTHER): Payer: PPO | Admitting: Family Medicine

## 2022-07-28 ENCOUNTER — Encounter (INDEPENDENT_AMBULATORY_CARE_PROVIDER_SITE_OTHER): Payer: Self-pay | Admitting: Family Medicine

## 2022-07-28 DIAGNOSIS — E669 Obesity, unspecified: Secondary | ICD-10-CM | POA: Diagnosis not present

## 2022-07-28 DIAGNOSIS — Z794 Long term (current) use of insulin: Secondary | ICD-10-CM | POA: Diagnosis not present

## 2022-07-28 DIAGNOSIS — Z6841 Body Mass Index (BMI) 40.0 and over, adult: Secondary | ICD-10-CM | POA: Diagnosis not present

## 2022-07-28 DIAGNOSIS — Z7984 Long term (current) use of oral hypoglycemic drugs: Secondary | ICD-10-CM

## 2022-07-28 DIAGNOSIS — E1165 Type 2 diabetes mellitus with hyperglycemia: Secondary | ICD-10-CM

## 2022-07-28 DIAGNOSIS — E559 Vitamin D deficiency, unspecified: Secondary | ICD-10-CM | POA: Diagnosis not present

## 2022-07-28 MED ORDER — VITAMIN D (ERGOCALCIFEROL) 1.25 MG (50000 UNIT) PO CAPS: 50000.0000 [IU] | ORAL_CAPSULE | ORAL | 0 refills | Status: DC

## 2022-07-29 DIAGNOSIS — R300 Dysuria: Secondary | ICD-10-CM | POA: Diagnosis not present

## 2022-07-29 DIAGNOSIS — N3001 Acute cystitis with hematuria: Secondary | ICD-10-CM | POA: Diagnosis not present

## 2022-08-01 ENCOUNTER — Other Ambulatory Visit: Payer: Self-pay | Admitting: Endocrinology

## 2022-08-19 ENCOUNTER — Ambulatory Visit (INDEPENDENT_AMBULATORY_CARE_PROVIDER_SITE_OTHER): Payer: PPO | Admitting: Family Medicine

## 2022-08-27 ENCOUNTER — Ambulatory Visit (INDEPENDENT_AMBULATORY_CARE_PROVIDER_SITE_OTHER): Payer: PPO | Admitting: Family Medicine

## 2022-08-27 ENCOUNTER — Encounter (INDEPENDENT_AMBULATORY_CARE_PROVIDER_SITE_OTHER): Payer: Self-pay | Admitting: Family Medicine

## 2022-08-27 VITALS — BP 151/74 | HR 84 | Temp 97.6°F | Ht 61.0 in | Wt 279.0 lb

## 2022-08-27 DIAGNOSIS — E1169 Type 2 diabetes mellitus with other specified complication: Secondary | ICD-10-CM | POA: Diagnosis not present

## 2022-08-27 DIAGNOSIS — Z7985 Long-term (current) use of injectable non-insulin antidiabetic drugs: Secondary | ICD-10-CM | POA: Diagnosis not present

## 2022-08-27 DIAGNOSIS — E559 Vitamin D deficiency, unspecified: Secondary | ICD-10-CM | POA: Diagnosis not present

## 2022-08-27 DIAGNOSIS — Z6841 Body Mass Index (BMI) 40.0 and over, adult: Secondary | ICD-10-CM | POA: Diagnosis not present

## 2022-08-27 DIAGNOSIS — E669 Obesity, unspecified: Secondary | ICD-10-CM | POA: Diagnosis not present

## 2022-08-27 DIAGNOSIS — Z794 Long term (current) use of insulin: Secondary | ICD-10-CM | POA: Diagnosis not present

## 2022-08-27 MED ORDER — VITAMIN D (ERGOCALCIFEROL) 1.25 MG (50000 UNIT) PO CAPS: 50000.0000 [IU] | ORAL_CAPSULE | ORAL | 0 refills | Status: DC

## 2022-08-27 MED ORDER — TIRZEPATIDE 15 MG/0.5ML ~~LOC~~ SOAJ: 15.0000 mg | SUBCUTANEOUS | 0 refills | Status: DC

## 2022-08-28 ENCOUNTER — Encounter (INDEPENDENT_AMBULATORY_CARE_PROVIDER_SITE_OTHER): Payer: Self-pay | Admitting: Family Medicine

## 2022-08-28 ENCOUNTER — Telehealth (INDEPENDENT_AMBULATORY_CARE_PROVIDER_SITE_OTHER): Payer: Self-pay | Admitting: Family Medicine

## 2022-08-28 NOTE — Telephone Encounter (Signed)
Patient Exact Message: Momjauro. I canceled the order at [facility]. COULD YOU fill it at [facility]. I get 3 mounts supply I need Libre 3 sensors also please. I have 2 days left on the current one. [facility] can fill it also. Thank You!!!!

## 2022-08-28 NOTE — Telephone Encounter (Deleted)
Patient Exact Message From Mychart: Momjauro. I canceled the order at [facility]. COULD YOU fill it at [facility] [address] [facility] long pharmacy. I get 3 mounts supply

## 2022-08-29 ENCOUNTER — Other Ambulatory Visit (HOSPITAL_COMMUNITY): Payer: Self-pay

## 2022-09-07 ENCOUNTER — Other Ambulatory Visit: Payer: Self-pay | Admitting: Endocrinology

## 2022-09-08 NOTE — Progress Notes (Unsigned)
TeleHealth Visit:  This visit was completed with telemedicine (audio/video) technology. Melanie Reeves has verbally consented to this TeleHealth visit. The patient is located at home, the provider is located at home. The participants in this visit include the listed provider and patient. The visit was conducted today via MyChart video.  OBESITY Melanie Reeves is here to discuss her progress with her obesity treatment plan along with follow-up of her obesity related diagnoses.   Today's visit was # 29 Starting weight: 294 lbs Starting date: 11/29/2020 Weight at last in office visit: 279 lbs on 08/27/22 Total weight loss: 15 lbs at last in office visit on 08/27/22. Today's reported weight: *** lbs No weight reported.  Nutrition Plan: keeping a food journal and adhering to recommended goals of 1800-2000 calories and 100 gms protein daily.   Current exercise: {exercise types:16438} none  Interim History: ***  Assessment/Plan:  1. ***  2. ***  3. ***  Obesity: Current BMI *** Melanie Reeves {CHL AMB IS/IS NOT:210130109} currently in the action stage of change. As such, her goal is to {MWMwtloss#1:210800005}.  She has agreed to {MWMwtlossportion/plan2:23431}.   Exercise goals: {MWM EXERCISE RECS:23473}  Behavioral modification strategies: {MWMwtlossdietstrategies3:23432}.  Melanie Reeves has agreed to follow-up with our clinic in {NUMBER 1-10:22536} weeks.   No orders of the defined types were placed in this encounter.   There are no discontinued medications.   No orders of the defined types were placed in this encounter.     Objective:   VITALS: Per patient if applicable, see vitals. GENERAL: Alert and in no acute distress. CARDIOPULMONARY: No increased WOB. Speaking in clear sentences.  PSYCH: Pleasant and cooperative. Speech normal rate and rhythm. Affect is appropriate. Insight and judgement are appropriate. Attention is focused, linear, and appropriate.  NEURO: Oriented as arrived to  appointment on time with no prompting.   Lab Results  Component Value Date   CREATININE 0.88 07/23/2022   BUN 44 (H) 07/23/2022   NA 141 07/23/2022   K 4.1 07/23/2022   CL 106 07/23/2022   CO2 18 (L) 07/23/2022   Lab Results  Component Value Date   ALT 27 10/16/2021   AST 32 10/16/2021   ALKPHOS 69 10/16/2021   BILITOT <0.2 10/16/2021   Lab Results  Component Value Date   HGBA1C 7.2 (H) 03/11/2022   HGBA1C 6.9 (H) 11/26/2021   HGBA1C 6.8 (H) 10/16/2021   HGBA1C 6.5 07/15/2021   HGBA1C 6.5 03/19/2021   Lab Results  Component Value Date   INSULIN 34.8 (H) 10/16/2021   Lab Results  Component Value Date   TSH 1.217 11/24/2020   Lab Results  Component Value Date   CHOL 152 10/16/2021   HDL 46 10/16/2021   LDLCALC 66 10/16/2021   LDLDIRECT 72.0 02/28/2020   TRIG 247 (H) 10/16/2021   CHOLHDL 3 07/15/2021   Lab Results  Component Value Date   WBC 11.3 (H) 06/24/2022   HGB 12.9 06/24/2022   HCT 40.5 06/24/2022   MCV 84 06/24/2022   PLT 245 06/24/2022   Lab Results  Component Value Date   FERRITIN 264 10/25/2020   Lab Results  Component Value Date   VD25OH 43.1 10/16/2021   VD25OH 36.2 06/05/2021   VD25OH 19.7 (L) 11/29/2020    Attestation Statements:   Reviewed by clinician on day of visit: allergies, medications, problem list, medical history, surgical history, family history, social history, and previous encounter notes.  ***(delete if time-based billing not used) Time spent on visit including the items listed below was ***  minutes.  -preparing to see the patient (e.g., review of tests, history, previous notes) -obtaining and/or reviewing separately obtained history -counseling and educating the patient/family/caregiver -documenting clinical information in the electronic or other health record -ordering medications, tests, or procedures -independently interpreting results and communicating results to the patient/ family/caregiver -referring and  communicating with other health care professionals  -care coordination

## 2022-09-09 ENCOUNTER — Telehealth (INDEPENDENT_AMBULATORY_CARE_PROVIDER_SITE_OTHER): Payer: PPO | Admitting: Family Medicine

## 2022-09-09 ENCOUNTER — Encounter (INDEPENDENT_AMBULATORY_CARE_PROVIDER_SITE_OTHER): Payer: Self-pay | Admitting: Family Medicine

## 2022-09-09 DIAGNOSIS — Z6841 Body Mass Index (BMI) 40.0 and over, adult: Secondary | ICD-10-CM

## 2022-09-09 DIAGNOSIS — E1169 Type 2 diabetes mellitus with other specified complication: Secondary | ICD-10-CM | POA: Diagnosis not present

## 2022-09-09 DIAGNOSIS — E669 Obesity, unspecified: Secondary | ICD-10-CM

## 2022-09-09 DIAGNOSIS — Z794 Long term (current) use of insulin: Secondary | ICD-10-CM | POA: Diagnosis not present

## 2022-09-09 NOTE — Progress Notes (Signed)
Chief Complaint:   OBESITY Melanie Reeves is here to discuss her progress with her obesity treatment plan along with follow-up of her obesity related diagnoses. Melanie Reeves is on keeping a food journal and adhering to recommended goals of 1800-2000 calories and 100 grams of protein and states she is following her eating plan approximately 80% of the time. Melanie Reeves states she is doing 0 minutes 0 times per week.  Today's visit was #: 28 Starting weight: 294 lbs Starting date: 11/29/2020 Today's weight: 279 bs Today's date: 08/27/2022 Total lbs lost to date: 15 Total lbs lost since last in-office visit: 5  Interim History: Melanie Reeves has done well with weight loss.  She is going to [place] to fitness center to do some swimming.  Her highest weight was 308 pounds and is now 279 pounds.  Subjective:   1. Vitamin D deficiency Melanie Reeves is taking vitamin D prescription with no side effects noted.  2. Type 2 diabetes mellitus with other specified complication, with long-term current use of insulin (HCC) Melanie Reeves is taking Mounjaro 15 mg once weekly with no side effects.  She is also taking canagliflozin-metformin, and Toujeo with no side effects.  She denies hypoglycemia.  Last A1c was 7.3 in May 2023, and has decreased down to 6.9 at her last lab check at [facility].  Assessment/Plan:   1. Vitamin D deficiency Melanie Reeves will continue prescription Vitamin D 50,000 IU every week, and we will refill for 1 month.  She will follow-up for routine testing of Vitamin D, at least 2-3 times per year to avoid over-replacement.  - Vitamin D, Ergocalciferol, (DRISDOL) 1.25 MG (50000 UNIT) CAPS capsule; Take 1 capsule by mouth every 7 days.  Dispense: 4 capsule; Refill: 0  2. Type 2 diabetes mellitus with other specified complication, with long-term current use of insulin (HCC) Melanie Reeves will continue with her medications, diet, and exercise.  We will refill Mounjaro 15 mg once weekly for 1 month.  - tirzepatide (MOUNJARO) 15  MG/0.5ML Pen; Inject 15 mg into the skin once a week.  Dispense: 6 mL; Refill: 0  3. Obesity, Current BMI 52.7 Melanie Reeves is currently in the action stage of change. As such, her goal is to continue with weight loss efforts. She has agreed to keeping a food journal and adhering to recommended goals of 1800-2000 calories and 100+ grams of protein daily.   Exercise goals: All adults should avoid inactivity. Some physical activity is better than none, and adults who participate in any amount of physical activity gain some health benefits.  Behavioral modification strategies: increasing lean protein intake, decreasing simple carbohydrates, and holiday eating strategies .  Melanie Reeves has agreed to follow-up with our clinic in 3 weeks via virtual with [facility], FNP-C, then in 2 weeks in office. She was informed of the importance of frequent follow-up visits to maximize her success with intensive lifestyle modifications for her multiple health conditions.   Objective:   Blood pressure (!) 151/74, pulse 84, temperature 97.6 F (36.4 C), height 5\' 1"  (1.549 m), weight 279 lb (126.6 kg), SpO2 91 %. Body mass index is 52.72 kg/m.  General: Cooperative, alert, well developed, in no acute distress. HEENT: Conjunctivae and lids unremarkable. Cardiovascular: Regular rhythm.  Lungs: Normal work of breathing. Neurologic: No focal deficits.   Lab Results  Component Value Date   CREATININE 0.88 07/23/2022   BUN 44 (H) 07/23/2022   NA 141 07/23/2022   K 4.1 07/23/2022   CL 106 07/23/2022   CO2 18 (L)  07/23/2022   Lab Results  Component Value Date   ALT 27 10/16/2021   AST 32 10/16/2021   ALKPHOS 69 10/16/2021   BILITOT <0.2 10/16/2021   Lab Results  Component Value Date   HGBA1C 7.2 (H) 03/11/2022   HGBA1C 6.9 (H) 11/26/2021   HGBA1C 6.8 (H) 10/16/2021   HGBA1C 6.5 07/15/2021   HGBA1C 6.5 03/19/2021   Lab Results  Component Value Date   INSULIN 34.8 (H) 10/16/2021   Lab Results  Component  Value Date   TSH 1.217 11/24/2020   Lab Results  Component Value Date   CHOL 152 10/16/2021   HDL 46 10/16/2021   LDLCALC 66 10/16/2021   LDLDIRECT 72.0 02/28/2020   TRIG 247 (H) 10/16/2021   CHOLHDL 3 07/15/2021   Lab Results  Component Value Date   VD25OH 43.1 10/16/2021   VD25OH 36.2 06/05/2021   VD25OH 19.7 (L) 11/29/2020   Lab Results  Component Value Date   WBC 11.3 (H) 06/24/2022   HGB 12.9 06/24/2022   HCT 40.5 06/24/2022   MCV 84 06/24/2022   PLT 245 06/24/2022   Lab Results  Component Value Date   FERRITIN 264 10/25/2020   Attestation Statements:   Reviewed by clinician on day of visit: allergies, medications, problem list, medical history, surgical history, family history, social history, and previous encounter notes.   I, Burt Knack, am acting as transcriptionist for Quillian Quince, MD.  I have reviewed the above documentation for accuracy and completeness, and I agree with the above. -  Quillian Quince, MD

## 2022-09-10 DIAGNOSIS — G4733 Obstructive sleep apnea (adult) (pediatric): Secondary | ICD-10-CM | POA: Diagnosis not present

## 2022-09-12 DIAGNOSIS — R0989 Other specified symptoms and signs involving the circulatory and respiratory systems: Secondary | ICD-10-CM | POA: Diagnosis not present

## 2022-09-12 DIAGNOSIS — J329 Chronic sinusitis, unspecified: Secondary | ICD-10-CM | POA: Diagnosis not present

## 2022-09-12 DIAGNOSIS — R0981 Nasal congestion: Secondary | ICD-10-CM | POA: Diagnosis not present

## 2022-09-12 DIAGNOSIS — J3489 Other specified disorders of nose and nasal sinuses: Secondary | ICD-10-CM | POA: Diagnosis not present

## 2022-09-15 DIAGNOSIS — H40023 Open angle with borderline findings, high risk, bilateral: Secondary | ICD-10-CM | POA: Diagnosis not present

## 2022-09-15 DIAGNOSIS — H31013 Macula scars of posterior pole (postinflammatory) (post-traumatic), bilateral: Secondary | ICD-10-CM | POA: Diagnosis not present

## 2022-09-15 DIAGNOSIS — E1165 Type 2 diabetes mellitus with hyperglycemia: Secondary | ICD-10-CM | POA: Diagnosis not present

## 2022-09-15 DIAGNOSIS — Z83511 Family history of glaucoma: Secondary | ICD-10-CM | POA: Diagnosis not present

## 2022-09-25 ENCOUNTER — Ambulatory Visit (INDEPENDENT_AMBULATORY_CARE_PROVIDER_SITE_OTHER): Payer: PPO | Admitting: Family Medicine

## 2022-09-25 VITALS — BP 128/72 | HR 88 | Temp 97.9°F | Ht 61.0 in | Wt 273.0 lb

## 2022-09-25 DIAGNOSIS — Z7984 Long term (current) use of oral hypoglycemic drugs: Secondary | ICD-10-CM | POA: Diagnosis not present

## 2022-09-25 DIAGNOSIS — E559 Vitamin D deficiency, unspecified: Secondary | ICD-10-CM | POA: Diagnosis not present

## 2022-09-25 DIAGNOSIS — E782 Mixed hyperlipidemia: Secondary | ICD-10-CM

## 2022-09-25 DIAGNOSIS — Z6841 Body Mass Index (BMI) 40.0 and over, adult: Secondary | ICD-10-CM

## 2022-09-25 DIAGNOSIS — E1169 Type 2 diabetes mellitus with other specified complication: Secondary | ICD-10-CM | POA: Diagnosis not present

## 2022-09-25 DIAGNOSIS — E860 Dehydration: Secondary | ICD-10-CM | POA: Diagnosis not present

## 2022-09-25 DIAGNOSIS — Z794 Long term (current) use of insulin: Secondary | ICD-10-CM

## 2022-09-25 DIAGNOSIS — E669 Obesity, unspecified: Secondary | ICD-10-CM

## 2022-09-25 MED ORDER — VITAMIN D (ERGOCALCIFEROL) 1.25 MG (50000 UNIT) PO CAPS: 50000.0000 [IU] | ORAL_CAPSULE | ORAL | 0 refills | Status: DC

## 2022-09-30 ENCOUNTER — Other Ambulatory Visit: Payer: Self-pay | Admitting: Endocrinology

## 2022-09-30 ENCOUNTER — Telehealth (INDEPENDENT_AMBULATORY_CARE_PROVIDER_SITE_OTHER): Payer: Self-pay | Admitting: Family Medicine

## 2022-10-01 DIAGNOSIS — F5101 Primary insomnia: Secondary | ICD-10-CM | POA: Diagnosis not present

## 2022-10-01 DIAGNOSIS — E119 Type 2 diabetes mellitus without complications: Secondary | ICD-10-CM | POA: Diagnosis not present

## 2022-10-01 DIAGNOSIS — Z794 Long term (current) use of insulin: Secondary | ICD-10-CM | POA: Diagnosis not present

## 2022-10-01 DIAGNOSIS — I480 Paroxysmal atrial fibrillation: Secondary | ICD-10-CM | POA: Diagnosis not present

## 2022-10-01 DIAGNOSIS — M25512 Pain in left shoulder: Secondary | ICD-10-CM | POA: Diagnosis not present

## 2022-10-01 DIAGNOSIS — Z6841 Body Mass Index (BMI) 40.0 and over, adult: Secondary | ICD-10-CM | POA: Diagnosis not present

## 2022-10-01 DIAGNOSIS — I509 Heart failure, unspecified: Secondary | ICD-10-CM | POA: Diagnosis not present

## 2022-10-01 DIAGNOSIS — E782 Mixed hyperlipidemia: Secondary | ICD-10-CM | POA: Diagnosis not present

## 2022-10-01 DIAGNOSIS — G8929 Other chronic pain: Secondary | ICD-10-CM | POA: Diagnosis not present

## 2022-10-08 NOTE — Progress Notes (Deleted)
TeleHealth Visit:  This visit was completed with telemedicine (audio/video) technology. Melanie Reeves has verbally consented to this TeleHealth visit. The patient is located at home, the provider is located at home. The participants in this visit include the listed provider and patient. The visit was conducted today via MyChart video.  OBESITY Melanie Reeves is here to discuss her progress with her obesity treatment plan along with follow-up of her obesity related diagnoses.   Today's visit was # 31 Starting weight: 294 lbs Starting date: 11/29/2020 Weight at last in office visit: 273 lbs on 09/25/22 Total weight loss: 21 lbs at last in office visit on 09/25/22. Today's reported weight: *** lbs No weight reported.   Nutrition Plan: keeping a food journal and adhering to recommended goals of 1800-2000 calories and 100 gms protein daily.   Current exercise: {exercise types:16438} none  Interim History:  ***  Assessment/Plan:  1. ***  2. ***  3. ***  Obesity: Current BMI *** Melanie Reeves {CHL AMB IS/IS NOT:210130109} currently in the action stage of change. As such, her goal is to {MWMwtloss#1:210800005}.  She has agreed to {MWMwtlossportion/plan2:23431}.   Exercise goals: {MWM EXERCISE RECS:23473}  Behavioral modification strategies: {MWMwtlossdietstrategies3:23432}.  Melanie Reeves has agreed to follow-up with our clinic in {NUMBER 1-10:22536} weeks.   No orders of the defined types were placed in this encounter.   There are no discontinued medications.   No orders of the defined types were placed in this encounter.     Objective:   VITALS: Per patient if applicable, see vitals. GENERAL: Alert and in no acute distress. CARDIOPULMONARY: No increased WOB. Speaking in clear sentences.  PSYCH: Pleasant and cooperative. Speech normal rate and rhythm. Affect is appropriate. Insight and judgement are appropriate. Attention is focused, linear, and appropriate.  NEURO: Oriented as arrived to  appointment on time with no prompting.   Lab Results  Component Value Date   CREATININE 0.88 07/23/2022   BUN 44 (H) 07/23/2022   NA 141 07/23/2022   K 4.1 07/23/2022   CL 106 07/23/2022   CO2 18 (L) 07/23/2022   Lab Results  Component Value Date   ALT 27 10/16/2021   AST 32 10/16/2021   ALKPHOS 69 10/16/2021   BILITOT <0.2 10/16/2021   Lab Results  Component Value Date   HGBA1C 7.2 (H) 03/11/2022   HGBA1C 6.9 (H) 11/26/2021   HGBA1C 6.8 (H) 10/16/2021   HGBA1C 6.5 07/15/2021   HGBA1C 6.5 03/19/2021   Lab Results  Component Value Date   INSULIN 34.8 (H) 10/16/2021   Lab Results  Component Value Date   TSH 1.217 11/24/2020   Lab Results  Component Value Date   CHOL 152 10/16/2021   HDL 46 10/16/2021   LDLCALC 66 10/16/2021   LDLDIRECT 72.0 02/28/2020   TRIG 247 (H) 10/16/2021   CHOLHDL 3 07/15/2021   Lab Results  Component Value Date   WBC 11.3 (H) 06/24/2022   HGB 12.9 06/24/2022   HCT 40.5 06/24/2022   MCV 84 06/24/2022   PLT 245 06/24/2022   Lab Results  Component Value Date   FERRITIN 264 10/25/2020   Lab Results  Component Value Date   VD25OH 43.1 10/16/2021   VD25OH 36.2 06/05/2021   VD25OH 19.7 (L) 11/29/2020    Attestation Statements:   Reviewed by clinician on day of visit: allergies, medications, problem list, medical history, surgical history, family history, social history, and previous encounter notes.  ***(delete if time-based billing not used) Time spent on visit including the items listed  below was *** minutes.  -preparing to see the patient (e.g., review of tests, history, previous notes) -obtaining and/or reviewing separately obtained history -counseling and educating the patient/family/caregiver -documenting clinical information in the electronic or other health record -ordering medications, tests, or procedures -independently interpreting results and communicating results to the patient/ family/caregiver -referring and  communicating with other health care professionals  -care coordination

## 2022-10-09 ENCOUNTER — Telehealth (INDEPENDENT_AMBULATORY_CARE_PROVIDER_SITE_OTHER): Payer: PPO | Admitting: Family Medicine

## 2022-10-09 DIAGNOSIS — R0989 Other specified symptoms and signs involving the circulatory and respiratory systems: Secondary | ICD-10-CM | POA: Diagnosis not present

## 2022-10-09 DIAGNOSIS — R059 Cough, unspecified: Secondary | ICD-10-CM | POA: Diagnosis not present

## 2022-10-09 DIAGNOSIS — J3489 Other specified disorders of nose and nasal sinuses: Secondary | ICD-10-CM | POA: Diagnosis not present

## 2022-10-09 DIAGNOSIS — R062 Wheezing: Secondary | ICD-10-CM | POA: Diagnosis not present

## 2022-10-09 DIAGNOSIS — R5383 Other fatigue: Secondary | ICD-10-CM | POA: Diagnosis not present

## 2022-10-09 DIAGNOSIS — E119 Type 2 diabetes mellitus without complications: Secondary | ICD-10-CM | POA: Diagnosis not present

## 2022-10-09 DIAGNOSIS — Z794 Long term (current) use of insulin: Secondary | ICD-10-CM | POA: Diagnosis not present

## 2022-10-09 NOTE — Telephone Encounter (Signed)
PA submitted via CoverMymeds on 09/30/2022 awaiting a response.

## 2022-10-09 NOTE — Progress Notes (Signed)
Chief Complaint:   OBESITY Melanie Reeves is here to discuss her progress with her obesity treatment plan along with follow-up of her obesity related diagnoses. Melanie Reeves is on keeping a food journal and adhering to recommended goals of 1800-2000 calories and 100 grams of protein daily and states she is following her eating plan approximately 70% of the time. Melanie Reeves states she is doing 0 minutes 0 times per week.  Today's visit was #: 30 Starting weight: 294 lbs Starting date: 11/29/2020 Today's weight: 273 lbs Today's date: 09/25/2022 Total lbs lost to date: 21 Total lbs lost since last in-office visit: 6  Interim History: Melanie Reeves is working on her diet and weight loss. Her hunger is controlled and she is trying to decrease simple carbohydrates and decrease the volume of her snacking.  Subjective:   1. Type 2 diabetes mellitus with other specified complication, with long-term current use of insulin (HCC) Melanie Reeves's average glucose is 123, and ranges between 111-135 most of the time. She has no episodes of hypoglycemia. She is on Toujeo, canagliflozin-metformin, and Mounjaro. She denies nausea or vomiting.   2. Vitamin D deficiency Melanie Reeves's Vitamin D level is slowing improving, but it is not yet at goal.   3. Mixed hyperlipidemia Melanie Reeves's triglycerides tend to be >150. She is working on decreasing simple carbohydrates.   4. Dehydration Melanie Reeves is working on increasing clear liquids, but her urine is still dark and her BUN and creatinine are both elevated.   Assessment/Plan:   1. Type 2 diabetes mellitus with other specified complication, with long-term current use of insulin (HCC) Melanie Reeves was educated on hypoglycemia and she was advised to decrease Toujeo by 10 units if her glucose is below 80.  2. Vitamin D deficiency We will refill prescription Vitamin D for 1 month. Melanie Reeves will follow-up for routine testing of Vitamin D, at least 2-3 times per year to avoid over-replacement.  - Vitamin D,  Ergocalciferol, (DRISDOL) 1.25 MG (50000 UNIT) CAPS capsule; Take 1 capsule by mouth every 7 days.  Dispense: 4 capsule; Refill: 0  3. Mixed hyperlipidemia Melanie Reeves was educated on decreasing simple carbohydrates to improve her triglycerides and cholesterol.  4. Dehydration Melanie Reeves will work on increasing her water intake to 100 oz daily.   5. Obesity, Current BMI 51.7 Melanie Reeves is currently in the action stage of change. As such, her goal is to continue with weight loss efforts. She has agreed to keeping a food journal and adhering to recommended goals of 1500-1800 calories and 100+ grams of protein daily.    Behavioral modification strategies: increasing lean protein intake, meal planning and cooking strategies, and keeping a strict food journal.  Melanie Reeves has agreed to follow-up with our clinic in 4 weeks. She was informed of the importance of frequent follow-up visits to maximize her success with intensive lifestyle modifications for her multiple health conditions.   Objective:   Blood pressure 128/72, pulse 88, temperature 97.9 F (36.6 C), height 5\' 1"  (1.549 m), weight 273 lb (123.8 kg), SpO2 91 %. Body mass index is 51.58 kg/m.  General: Cooperative, alert, well developed, in no acute distress. HEENT: Conjunctivae and lids unremarkable. Cardiovascular: Regular rhythm.  Lungs: Normal work of breathing. Neurologic: No focal deficits.   Lab Results  Component Value Date   CREATININE 0.88 07/23/2022   BUN 44 (H) 07/23/2022   NA 141 07/23/2022   K 4.1 07/23/2022   CL 106 07/23/2022   CO2 18 (L) 07/23/2022   Lab Results  Component Value Date  ALT 27 10/16/2021   AST 32 10/16/2021   ALKPHOS 69 10/16/2021   BILITOT <0.2 10/16/2021   Lab Results  Component Value Date   HGBA1C 7.2 (H) 03/11/2022   HGBA1C 6.9 (H) 11/26/2021   HGBA1C 6.8 (H) 10/16/2021   HGBA1C 6.5 07/15/2021   HGBA1C 6.5 03/19/2021   Lab Results  Component Value Date   INSULIN 34.8 (H) 10/16/2021   Lab  Results  Component Value Date   TSH 1.217 11/24/2020   Lab Results  Component Value Date   CHOL 152 10/16/2021   HDL 46 10/16/2021   LDLCALC 66 10/16/2021   LDLDIRECT 72.0 02/28/2020   TRIG 247 (H) 10/16/2021   CHOLHDL 3 07/15/2021   Lab Results  Component Value Date   VD25OH 43.1 10/16/2021   VD25OH 36.2 06/05/2021   VD25OH 19.7 (L) 11/29/2020   Lab Results  Component Value Date   WBC 11.3 (H) 06/24/2022   HGB 12.9 06/24/2022   HCT 40.5 06/24/2022   MCV 84 06/24/2022   PLT 245 06/24/2022   Lab Results  Component Value Date   FERRITIN 264 10/25/2020   Attestation Statements:   Reviewed by clinician on day of visit: allergies, medications, problem list, medical history, surgical history, family history, social history, and previous encounter notes.  Time spent on visit including pre-visit chart review and post-visit care and charting was 40 minutes.   I, Burt Knack, am acting as transcriptionist for Quillian Quince, MD.  I have reviewed the above documentation for accuracy and completeness, and I agree with the above. -  Quillian Quince, MD

## 2022-10-10 ENCOUNTER — Other Ambulatory Visit: Payer: Self-pay | Admitting: Internal Medicine

## 2022-10-10 DIAGNOSIS — I480 Paroxysmal atrial fibrillation: Secondary | ICD-10-CM

## 2022-10-10 NOTE — Telephone Encounter (Signed)
Prescription refill request for Xarelto received.  Indication: PAF Last office visit: 06/24/22  Lovina Reach MD Weight: 131.3kg Age: 71 Scr: 0.88  CrCl: 125.06  Based on above findings Xarelto 20mg  daily is the appropriate dose.  Refill approved.

## 2022-10-14 NOTE — Progress Notes (Signed)
TeleHealth Visit:  This visit was completed with telemedicine (audio/video) technology. Melanie Reeves has verbally consented to this TeleHealth visit. The patient is located at home, the provider is located at home. The participants in this visit include the listed provider and patient. The visit was conducted today via MyChart video.  OBESITY Melanie Reeves is here to discuss her progress with her obesity treatment plan along with follow-up of her obesity related diagnoses.   Today's visit was # 31 Starting weight: 294 lbs Starting date: 11/29/2020 Weight at last in office visit: 273 lbs on 09/25/22 Total weight loss: 21 lbs at last in office visit on 14/14/23. Today's reported weight: 276 lbs   Nutrition Plan: keeping a food journal and adhering to recommended goals of 1500-1800 calories and 100+ grams of protein daily.   .   Current exercise: Went to pool for 3 hours last week  Interim History:  On the has been sick with a cough.  She is on prednisone and antibiotics.   She  increased her water intake and her urine is more clear.  She has done well over the holidays.  She has been able to moderate her portions and make better choices.  She says she gained up to 284 but has  since lost down to 276 lbs. She feels the Melanie Reeves has really helped her to make better food choices.  She is not journaling.  She usually has only 2 meals per day and some snacks.  She went to the pool and stayed 3 hours.  She was very sore afterwards.  She is going to try to attend the pool exercise class at least once weekly.  Assessment/Plan:  1. Type II Diabetes HgbA1c is at goal. Last A1c was 6.8 on 10/01/22 (Care Everywhere) CBGs: 19% out of range due to steroids (prednisone for 10 days) Episodes of hypoglycemia: no Medication(s): Mounjaro 15 mg weekly, Toujeo 100 units daily, canagliflozin/metformin 150/1000 mg 2 tablets at supper.  Lab Results  Component Value Date   HGBA1C 7.2 (H) 03/11/2022   HGBA1C  6.9 (H) 11/26/2021   HGBA1C 6.8 (H) 10/16/2021   Lab Results  Component Value Date   MICROALBUR <0.7 07/15/2021   LDLCALC 66 10/16/2021   CREATININE 0.88 07/23/2022    Plan: Continue all medications at current dosages. Follow-up with endocrinology (Dr. Hartford Poli) on March 19.   2. URI Has productive cough.  No fever.  PCP prescribed Augmentin and a course of prednisone.  Taking over-the-counter Mucinex.  She is drinking plenty of water.  Plan: Continue Augmentin and prednisone until course is complete. Increase water intake, especially while taking Mucinex.  3. Obesity: Current BMI 51.7 Melanie Reeves is currently in the action stage of change. As such, her goal is to continue with weight loss efforts.  She has agreed to practicing portion control and making smarter food choices, such as increasing vegetables and decreasing simple carbohydrates.   Only eating 2 meals per day, have snacks with total of at least 20 g of protein. Options for high-protein snacks discussed.  Exercise goals: Pool exercise at least once weekly.  Behavioral modification strategies: increasing lean protein intake, decreasing simple carbohydrates, increasing water intake, and better snacking choices.  Melanie Reeves has agreed to follow-up with our clinic in 3 weeks.   No orders of the defined types were placed in this encounter.   Medications Discontinued During This Encounter  Medication Reason   loratadine (CLARITIN) 10 MG tablet Change in therapy     No orders of the defined  types were placed in this encounter.     Objective:   VITALS: Per patient if applicable, see vitals. GENERAL: Alert and in no acute distress. CARDIOPULMONARY: No increased WOB. Speaking in clear sentences.  PSYCH: Pleasant and cooperative. Speech normal rate and rhythm. Affect is appropriate. Insight and judgement are appropriate. Attention is focused, linear, and appropriate.  NEURO: Oriented as arrived to appointment on time with no  prompting.   Lab Results  Component Value Date   CREATININE 0.88 07/23/2022   BUN 44 (H) 07/23/2022   NA 141 07/23/2022   K 4.1 07/23/2022   CL 106 07/23/2022   CO2 18 (L) 07/23/2022   Lab Results  Component Value Date   ALT 27 10/16/2021   AST 32 10/16/2021   ALKPHOS 69 10/16/2021   BILITOT <0.2 10/16/2021   Lab Results  Component Value Date   HGBA1C 7.2 (H) 03/11/2022   HGBA1C 6.9 (H) 11/26/2021   HGBA1C 6.8 (H) 10/16/2021   HGBA1C 6.5 07/15/2021   HGBA1C 6.5 03/19/2021   Lab Results  Component Value Date   INSULIN 34.8 (H) 10/16/2021   Lab Results  Component Value Date   TSH 1.217 11/24/2020   Lab Results  Component Value Date   CHOL 152 10/16/2021   HDL 46 10/16/2021   LDLCALC 66 10/16/2021   LDLDIRECT 72.0 02/28/2020   TRIG 247 (H) 10/16/2021   CHOLHDL 3 07/15/2021   Lab Results  Component Value Date   WBC 11.3 (H) 06/24/2022   HGB 12.9 06/24/2022   HCT 40.5 06/24/2022   MCV 84 06/24/2022   PLT 245 06/24/2022   Lab Results  Component Value Date   FERRITIN 264 10/25/2020   Lab Results  Component Value Date   VD25OH 43.1 10/16/2021   VD25OH 36.2 06/05/2021   VD25OH 19.7 (L) 11/29/2020    Attestation Statements:   Reviewed by clinician on day of visit: allergies, medications, problem list, medical history, surgical history, family history, social history, and previous encounter notes.  Time spent on visit including the items listed below was 30 minutes.  -preparing to see the patient (e.g., review of tests, history, previous notes) -obtaining and/or reviewing separately obtained history -counseling and educating the patient/family/caregiver -documenting clinical information in the electronic or other health record -ordering medications, tests, or procedures -independently interpreting results and communicating results to the patient/ family/caregiver -referring and communicating with other health care professionals  -care coordination

## 2022-10-15 ENCOUNTER — Telehealth (INDEPENDENT_AMBULATORY_CARE_PROVIDER_SITE_OTHER): Payer: PPO | Admitting: Family Medicine

## 2022-10-15 ENCOUNTER — Encounter (INDEPENDENT_AMBULATORY_CARE_PROVIDER_SITE_OTHER): Payer: Self-pay | Admitting: Family Medicine

## 2022-10-15 DIAGNOSIS — J069 Acute upper respiratory infection, unspecified: Secondary | ICD-10-CM

## 2022-10-15 DIAGNOSIS — E669 Obesity, unspecified: Secondary | ICD-10-CM

## 2022-10-15 DIAGNOSIS — Z7985 Long-term (current) use of injectable non-insulin antidiabetic drugs: Secondary | ICD-10-CM

## 2022-10-15 DIAGNOSIS — E1169 Type 2 diabetes mellitus with other specified complication: Secondary | ICD-10-CM | POA: Diagnosis not present

## 2022-10-15 DIAGNOSIS — Z794 Long term (current) use of insulin: Secondary | ICD-10-CM

## 2022-10-15 DIAGNOSIS — Z6841 Body Mass Index (BMI) 40.0 and over, adult: Secondary | ICD-10-CM

## 2022-10-15 IMAGING — DX DG CHEST 1V PORT
1 series · 1 of 1 positions shown · non-contrast
Comparison: Radiograph 12/25/2014

CLINICAL DATA: Cough, MIRK7-T3 and weakness

EXAM:
PORTABLE CHEST 1 VIEW

[chest ap grid]
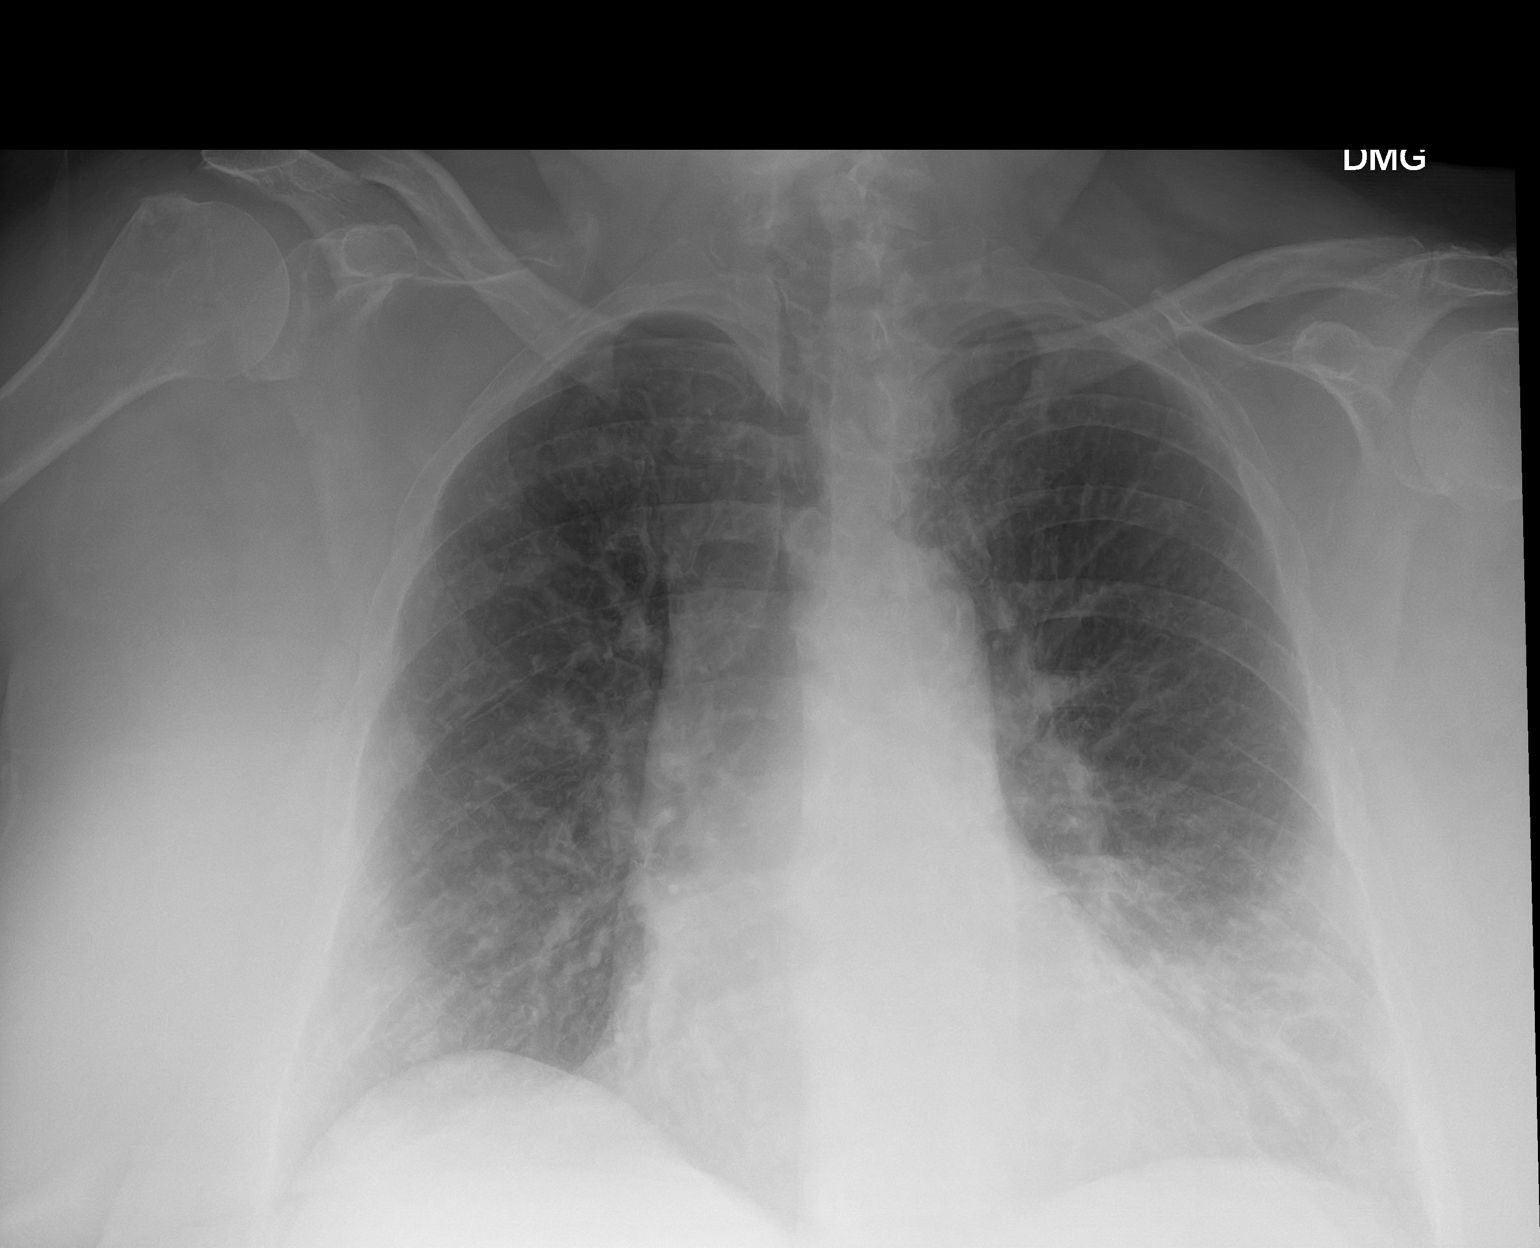

[1 of 1 positions shown; findings below may reference images not displayed]

FINDINGS: Mixed patchy and interstitial opacity throughout the lungs with a
basilar and peripheral predominance. No pneumothorax or visible
effusion the portion of the costophrenic sulci are collimated. The
aorta is calcified. The remaining cardiomediastinal contours are
unremarkable. No acute osseous or soft tissue abnormality.
Degenerative changes are present in the imaged spine and shoulders.
IMPRESSION: Mixed patchy and interstitial opacity throughout the lungs with a
basilar and peripheral predominance, compatible with a multifocal
pneumonia in the setting of MIRK7-T3 positivity.

## 2022-10-20 NOTE — Telephone Encounter (Signed)
No response has been received, called and spoke with a representative from [employer]. She will fax over a paper copy to be completed for patient's insurance company to make final decision.

## 2022-10-23 ENCOUNTER — Other Ambulatory Visit: Payer: Self-pay | Admitting: Endocrinology

## 2022-11-06 ENCOUNTER — Ambulatory Visit (INDEPENDENT_AMBULATORY_CARE_PROVIDER_SITE_OTHER): Payer: PPO | Admitting: Family Medicine

## 2022-11-12 ENCOUNTER — Ambulatory Visit (INDEPENDENT_AMBULATORY_CARE_PROVIDER_SITE_OTHER): Payer: PPO | Admitting: Family Medicine

## 2022-11-12 ENCOUNTER — Encounter (INDEPENDENT_AMBULATORY_CARE_PROVIDER_SITE_OTHER): Payer: Self-pay | Admitting: Family Medicine

## 2022-11-12 VITALS — BP 159/76 | HR 97 | Temp 97.4°F | Ht 61.0 in | Wt 276.0 lb

## 2022-11-12 DIAGNOSIS — E669 Obesity, unspecified: Secondary | ICD-10-CM | POA: Diagnosis not present

## 2022-11-12 DIAGNOSIS — I10 Essential (primary) hypertension: Secondary | ICD-10-CM | POA: Diagnosis not present

## 2022-11-12 DIAGNOSIS — Z6841 Body Mass Index (BMI) 40.0 and over, adult: Secondary | ICD-10-CM | POA: Insufficient documentation

## 2022-11-12 DIAGNOSIS — E1169 Type 2 diabetes mellitus with other specified complication: Secondary | ICD-10-CM

## 2022-11-12 DIAGNOSIS — Z794 Long term (current) use of insulin: Secondary | ICD-10-CM

## 2022-11-12 DIAGNOSIS — Z7985 Long-term (current) use of injectable non-insulin antidiabetic drugs: Secondary | ICD-10-CM

## 2022-11-12 DIAGNOSIS — E559 Vitamin D deficiency, unspecified: Secondary | ICD-10-CM

## 2022-11-12 MED ORDER — TIRZEPATIDE 15 MG/0.5ML ~~LOC~~ SOAJ: 15.0000 mg | SUBCUTANEOUS | 0 refills | Status: DC

## 2022-11-12 MED ORDER — VITAMIN D (ERGOCALCIFEROL) 1.25 MG (50000 UNIT) PO CAPS: 50000.0000 [IU] | ORAL_CAPSULE | ORAL | 0 refills | Status: DC

## 2022-11-13 ENCOUNTER — Encounter (INDEPENDENT_AMBULATORY_CARE_PROVIDER_SITE_OTHER): Payer: Self-pay | Admitting: Family Medicine

## 2022-11-18 ENCOUNTER — Other Ambulatory Visit: Payer: Self-pay

## 2022-11-18 ENCOUNTER — Encounter (HOSPITAL_BASED_OUTPATIENT_CLINIC_OR_DEPARTMENT_OTHER): Payer: Self-pay

## 2022-11-18 ENCOUNTER — Emergency Department (HOSPITAL_BASED_OUTPATIENT_CLINIC_OR_DEPARTMENT_OTHER)
Admission: EM | Admit: 2022-11-18 | Discharge: 2022-11-18 | Disposition: A | Discharge status: Home / Self Care | Payer: PPO | Attending: Emergency Medicine | Admitting: Emergency Medicine

## 2022-11-18 ENCOUNTER — Emergency Department (HOSPITAL_BASED_OUTPATIENT_CLINIC_OR_DEPARTMENT_OTHER): Payer: PPO | Admitting: Radiology

## 2022-11-18 DIAGNOSIS — N183 Chronic kidney disease, stage 3 unspecified: Secondary | ICD-10-CM | POA: Insufficient documentation

## 2022-11-18 DIAGNOSIS — I130 Hypertensive heart and chronic kidney disease with heart failure and stage 1 through stage 4 chronic kidney disease, or unspecified chronic kidney disease: Secondary | ICD-10-CM | POA: Insufficient documentation

## 2022-11-18 DIAGNOSIS — Z7984 Long term (current) use of oral hypoglycemic drugs: Secondary | ICD-10-CM | POA: Diagnosis not present

## 2022-11-18 DIAGNOSIS — Z7901 Long term (current) use of anticoagulants: Secondary | ICD-10-CM | POA: Diagnosis not present

## 2022-11-18 DIAGNOSIS — Z79899 Other long term (current) drug therapy: Secondary | ICD-10-CM | POA: Insufficient documentation

## 2022-11-18 DIAGNOSIS — Z794 Long term (current) use of insulin: Secondary | ICD-10-CM | POA: Insufficient documentation

## 2022-11-18 DIAGNOSIS — E1122 Type 2 diabetes mellitus with diabetic chronic kidney disease: Secondary | ICD-10-CM | POA: Insufficient documentation

## 2022-11-18 DIAGNOSIS — I4891 Unspecified atrial fibrillation: Secondary | ICD-10-CM | POA: Insufficient documentation

## 2022-11-18 DIAGNOSIS — J329 Chronic sinusitis, unspecified: Secondary | ICD-10-CM

## 2022-11-18 DIAGNOSIS — I509 Heart failure, unspecified: Secondary | ICD-10-CM | POA: Diagnosis not present

## 2022-11-18 DIAGNOSIS — R0981 Nasal congestion: Secondary | ICD-10-CM | POA: Diagnosis present

## 2022-11-18 DIAGNOSIS — R0602 Shortness of breath: Secondary | ICD-10-CM

## 2022-11-18 LAB — BASIC METABOLIC PANEL
Anion gap: 13 (ref 5–15)
BUN: 34 mg/dL — ABNORMAL HIGH (ref 8–23)
CO2: 21 mmol/L — ABNORMAL LOW (ref 22–32)
Calcium: 10.1 mg/dL (ref 8.9–10.3)
Chloride: 103 mmol/L (ref 98–111)
Creatinine, Ser: 0.88 mg/dL (ref 0.44–1.00)
GFR, Estimated: >60 mL/min (ref >60)
Glucose, Bld: 122 mg/dL — ABNORMAL HIGH (ref 70–99)
Potassium: 5.3 mmol/L — ABNORMAL HIGH (ref 3.5–5.1)
Sodium: 137 mmol/L (ref 135–145)

## 2022-11-18 LAB — CBC WITH DIFFERENTIAL/PLATELET
Abs Immature Granulocytes: 0.07 10*3/uL (ref 0.00–0.07)
Basophils Absolute: 0.1 10*3/uL (ref 0.0–0.1)
Basophils Relative: 1 %
Eosinophils Absolute: 0.3 10*3/uL (ref 0.0–0.5)
Eosinophils Relative: 2 %
HCT: 42.9 % (ref 36.0–46.0)
Hemoglobin: 13.6 g/dL (ref 12.0–15.0)
Immature Granulocytes: 1 %
Lymphocytes Relative: 26 %
Lymphs Abs: 3 10*3/uL (ref 0.7–4.0)
MCH: 27 pg (ref 26.0–34.0)
MCHC: 31.7 g/dL (ref 30.0–36.0)
MCV: 85.1 fL (ref 80.0–100.0)
Monocytes Absolute: 0.5 10*3/uL (ref 0.1–1.0)
Monocytes Relative: 4 %
Neutro Abs: 7.8 10*3/uL — ABNORMAL HIGH (ref 1.7–7.7)
Neutrophils Relative %: 66 %
Platelets: 256 10*3/uL (ref 150–400)
RBC: 5.04 MIL/uL (ref 3.87–5.11)
RDW: 15.7 % — ABNORMAL HIGH (ref 11.5–15.5)
WBC: 11.7 10*3/uL — ABNORMAL HIGH (ref 4.0–10.5)
nRBC: 0 % (ref 0.0–0.2)

## 2022-11-18 LAB — BRAIN NATRIURETIC PEPTIDE: B Natriuretic Peptide: 28.9 pg/mL (ref 0.0–100.0)

## 2022-11-18 LAB — MAGNESIUM: Magnesium: 2 mg/dL (ref 1.7–2.4)

## 2022-11-18 LAB — TROPONIN I (HIGH SENSITIVITY): Troponin I (High Sensitivity): 3 ng/L (ref <18)

## 2022-11-18 MED ORDER — AMOXICILLIN-POT CLAVULANATE 875-125 MG PO TABS: 1.0000 | ORAL_TABLET | Freq: Two times a day (BID) | ORAL | 0 refills | Status: DC

## 2022-11-18 NOTE — ED Provider Notes (Signed)
[facility] Provider Note   CSN: [id number] Arrival date & time: 11/18/22  1527     History  Chief Complaint  Patient presents with   Shortness of Breath    Melanie Reeves is a 72 y.o. female.  72 year old female presents today for evaluation of shortness of breath as well as sinus congestion intermittently going on for about a month.  She states her symptoms have been worse over the past few days.  She was seen at her primary care provider's office today and was recommended to come into the emergency department due to an abnormal EKG.  Prior to the EKG finding plan was for her to start back on Lasix.  She states her sinus congestion is not as bad as it has been in the past.  No postnasal drip.  No cough.  Afebrile.  Endorses bilateral peripheral edema.  She states she recently gained 3 pounds which she believes to be fluid weight.  Does have history of CHF however has been off of her Lasix she states for quite some time.  Reports she at baseline sleeps on a recliner due to history of frozen shoulder.  No recent change.  Denies PND.  The history is provided by the patient. No language interpreter was used.       Home Medications Prior to Admission medications   Medication Sig Start Date End Date Taking? Authorizing Provider  amLODipine (NORVASC) 5 MG tablet TAKE 1 TABLET (5 MG TOTAL) BY MOUTH DAILY. 07/24/22   Fay Records, MD  atenolol (TENORMIN) 25 MG tablet Take 1 tablet (25 mg total) by mouth daily. 10/26/20   Orson Eva, MD  B-D ULTRAFINE III SHORT PEN 31G X 8 MM MISC USE TO INJECT MEDICATION 5 TIMES DAILY. 06/13/21   Elayne Snare, MD  Blood Glucose Monitoring Suppl (ONETOUCH VERIO) w/Device KIT UAD to monitor glucose tid; Dx: E11.65 07/09/18   Elayne Snare, MD  Canagliflozin-metFORMIN HCl [phone] MG TABS Take 2 tablets by mouth daily with supper.    [provider]  cetirizine (ZYRTEC) 10 MG chewable tablet Chew 10 mg by mouth  daily.    [provider]  Continuous Blood Gluc Sensor (FREESTYLE LIBRE 3 SENSOR) MISC Place 1 sensor on the skin every 14 days. Use to check glucose continuously 07/02/22   Whitmire, Joneen Boers, FNP  cyclobenzaprine (FLEXERIL) 5 MG tablet Take 5 mg by mouth 3 (three) times daily as needed for muscle spasms.    [provider]  diphenhydrAMINE (BENADRYL) 25 mg capsule Take 50 mg by mouth at bedtime as needed for sleep.    [provider]  DULoxetine (CYMBALTA) 30 MG capsule TAKE 1 CAPSULE BY MOUTH DAILY. TAKE IN ADDITION TO THE 60 MG 09/30/22   Elayne Snare, MD  DULoxetine (CYMBALTA) 60 MG capsule Take 60 mg by mouth daily. Taking in the AM    [provider]  fenofibrate (TRICOR) 145 MG tablet TAKE 1 TABLET BY MOUTH EVERY DAY 04/25/22   Elayne Snare, MD  glucose blood (ONETOUCH ULTRA) test strip USE AS INSTRUCTED TO CHECK BLOOD SUGAR THREE TIMES DAILY. 05/07/21   Elayne Snare, MD  HYDROcodone-acetaminophen (NORCO/VICODIN) 5-325 MG tablet Take 1 tablet by mouth every 6 (six) hours as needed for moderate pain.    [provider]  ibuprofen (ADVIL) 200 MG tablet Take 400 mg by mouth every 6 (six) hours as needed for fever.    [provider]  Omega-3 Fatty Acids (  FISH OIL) 1000 MG CAPS Take 2,000 mg by mouth daily.    [provider]  pravastatin (PRAVACHOL) 40 MG tablet TAKE 1 TABLET BY MOUTH EVERY DAY 02/05/22   Elayne Snare, MD  pregabalin (LYRICA) 100 MG capsule Take 1 capsule (100 mg total) by mouth 2 (two) times daily. 04/01/22   Fay Records, MD  tirzepatide [facility]) 15 MG/0.5ML Pen Inject 15 mg into the skin once a week. 11/12/22   Beasley, Caren D, MD  TOUJEO MAX SOLOSTAR 300 UNIT/ML Solostar Pen INJECT 140 UNITS INTO THE SKIN DAILY. Marland Kitchen Patient taking differently: 100 Units. 09/25/21   Elayne Snare, MD  Vitamin D, Ergocalciferol, (DRISDOL) 1.25 MG (50000 UNIT) CAPS capsule Take 1 capsule by mouth every 7 days. 11/12/22   Starlyn Skeans, MD   XARELTO 20 MG TABS tablet TAKE 1 TABLET BY MOUTH DAILY WITH SUPPER 10/10/22   Fay Records, MD      Allergies    Simvastatin and Sulfa antibiotics    Review of Systems   Review of Systems  Constitutional:  Negative for chills and fever.  HENT:  Positive for congestion and sinus pressure. Negative for postnasal drip, tinnitus and trouble swallowing.   Respiratory:  Positive for shortness of breath. Negative for cough.   Cardiovascular:  Negative for chest pain.  Gastrointestinal:  Negative for abdominal pain, nausea and vomiting.  Neurological:  Negative for syncope and light-headedness.  All other systems reviewed and are negative.   Physical Exam Updated Vital Signs BP (!) 153/82 (BP Location: Right Arm)   Pulse 83   Temp 98.1 F (36.7 C) (Oral)   Resp 18   Ht 5\' 1"  (1.549 m)   Wt 125.2 kg   SpO2 97%   BMI 52.15 kg/m  Physical Exam Vitals and nursing note reviewed.  Constitutional:      General: She is not in acute distress.    Appearance: Normal appearance. She is obese. She is not ill-appearing.  HENT:     Head: Normocephalic and atraumatic.     Nose: Nose normal.  Eyes:     General: No scleral icterus.    Extraocular Movements: Extraocular movements intact.     Conjunctiva/sclera: Conjunctivae normal.  Cardiovascular:     Rate and Rhythm: Normal rate and regular rhythm.     Pulses: Normal pulses.  Pulmonary:     Effort: Pulmonary effort is normal. No respiratory distress.     Breath sounds: Normal breath sounds. No wheezing or rales.  Abdominal:     General: There is no distension.     Tenderness: There is no abdominal tenderness. There is no guarding.  Musculoskeletal:        General: Normal range of motion.     Cervical back: Normal range of motion.     Right lower leg: No edema.     Left lower leg: No edema.  Skin:    General: Skin is warm and dry.  Neurological:     General: No focal deficit present.     Mental Status: She is alert and oriented to  person, place, and time. Mental status is at baseline.     ED Results / Procedures / Treatments   Labs (all labs ordered are listed, but only abnormal results are displayed) Labs Reviewed  BASIC METABOLIC PANEL - Abnormal; Notable for the following components:      Result Value   Potassium 5.3 (*)    CO2 21 (*)    Glucose, Bld  122 (*)    BUN 34 (*)    All other components within normal limits  CBC WITH DIFFERENTIAL/PLATELET - Abnormal; Notable for the following components:   WBC 11.7 (*)    RDW 15.7 (*)    Neutro Abs 7.8 (*)    All other components within normal limits  BRAIN NATRIURETIC PEPTIDE  MAGNESIUM  TROPONIN I (HIGH SENSITIVITY)    EKG None  Radiology DG Chest 2 View  Result Date: 11/18/2022 CLINICAL DATA:  Chest pain EXAM: CHEST - 2 VIEW COMPARISON:  11/23/2020 FINDINGS: The heart size and mediastinal contours are within normal limits. Both lungs are clear. The visualized skeletal structures are unremarkable. IMPRESSION: No active cardiopulmonary disease. Electronically Signed   By: Ulyses Jarred M.D.   On: 11/18/2022 16:16    Procedures Procedures    Medications Ordered in ED Medications - No data to display  ED Course/ Medical Decision Making/ A&P                             Medical Decision Making Amount and/or Complexity of Data Reviewed Labs: ordered. Radiology: ordered.   Medical Decision Making / ED Course   This patient presents to the ED for concern of shortness of breath, this involves an extensive number of treatment options, and is a complaint that carries with it a high risk of complications and morbidity.  The differential diagnosis includes viral URI, volume overload from CHF exacerbation, PE, ACS, pneumonia  MDM: 72 year old female presents today for evaluation of shortness of breath, sinusitis.  She found to have an abnormal EKG at PCP office and was referred to the emergency department.  Otherwise the plan was for her to start back on  her Lasix and potassium supplement.  Workup today is overall reassuring with CBC with mild leukocytosis but no left shift.  No anemia.  BMP shows preserved renal function, no acute findings.  Potassium is 5.3.  Will advise her not to start on the potassium supplement.  Will have her follow-up with her PCP to have repeat BMP.Marland Kitchen  She will start on the Lasix.  BNP is within normal.  Initial troponin of 3.  She is without chest pain.  EKG without acute ischemic changes.  Shows chronic left bundle.  No suspicion for ACS. Given the sinus congestion and sinus pressure we will start patient on Augmentin.  Strict return precaution discussed.  Discussed she will need to follow-up with her PCP for the repeat BMP.  Discussed she can start her Lasix.  She voices understanding along with her husband who is at bedside.   Additional history obtained: -Additional history obtained from PCP visit from earlier today -External records from outside source obtained and reviewed including: Chart review including previous notes, labs, imaging, consultation notes   Lab Tests: -I ordered, reviewed, and interpreted labs.   The pertinent results include:   Labs Reviewed  BASIC METABOLIC PANEL - Abnormal; Notable for the following components:      Result Value   Potassium 5.3 (*)    CO2 21 (*)    Glucose, Bld 122 (*)    BUN 34 (*)    All other components within normal limits  CBC WITH DIFFERENTIAL/PLATELET - Abnormal; Notable for the following components:   WBC 11.7 (*)    RDW 15.7 (*)    Neutro Abs 7.8 (*)    All other components within normal limits  BRAIN NATRIURETIC PEPTIDE  MAGNESIUM  TROPONIN I (HIGH SENSITIVITY)      EKG  EKG Interpretation  Date/Time:    Ventricular Rate:    PR Interval:    QRS Duration:   QT Interval:    QTC Calculation:   R Axis:     Text Interpretation:           Imaging Studies ordered: I ordered imaging studies including cxr I independently visualized and interpreted  imaging. I agree with the radiologist interpretation   Medicines ordered and prescription drug management: No orders of the defined types were placed in this encounter.   -I have reviewed the patients home medicines and have made adjustments as needed   Cardiac Monitoring: The patient was maintained on a cardiac monitor.  I personally viewed and interpreted the cardiac monitored which showed an underlying rhythm of: NSR   Reevaluation: After the interventions noted above, I reevaluated the patient and found that they have :stayed the same Patient remained without acute distress throughout her emergency room stay.  Co morbidities that complicate the patient evaluation  Past Medical History:  Diagnosis Date   Abnormal electrocardiogram 08/14/2014   LBBB   Anemia    Anxiety    Arthritis    Atrial fibrillation (HCC)    Bilateral swelling of feet    Chest pain    Constipation    Depression    Diabetes mellitus (HCC)    Difficulty walking    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   Qualifier: Diagnosis of  By: Romeo Apple MD, [place]     Dyspnea 08/14/2014   Edema    Gallbladder problem    GERD (gastroesophageal reflux disease)    Hyperlipidemia    Hypertension    LOW BACK PAIN 12/20/2009   Qualifier: Diagnosis of  By: Romeo Apple MD, [place]     Morbid obesity (HCC)    OSA (obstructive sleep apnea) 08/16/2014   Osteoarthritis    Other fatigue    Palpitations    Seasonal allergies    Shortness of breath    Shortness of breath on exertion    Sinusitis    SPINAL STENOSIS 12/20/2009   Qualifier: Diagnosis of  By: Romeo Apple MD, Duffy Rhody     Stage 3 chronic kidney disease [facility])       Dispostion: Patient is appropriate for discharge.  Discharged in stable condition.  Return precaution discussed.  Patient voices understanding and is in agreement with plan.  EKG similar to prior.  Potassium mildly elevated.  Advised to hold the potassium supplement until she gets a recheck BMP within a week.  Case discussed with attending who is in agreement with plan.   Final Clinical Impression(s) / ED Diagnoses Final diagnoses:  Shortness of breath  Sinusitis, unspecified chronicity, unspecified location    Rx / DC Orders ED Discharge Orders          Ordered    amoxicillin-clavulanate (AUGMENTIN) 875-125 MG tablet  Every 12 hours        11/18/22 1727              Marita Kansas, PA-C 11/18/22 1728    Ernie Avena, MD 11/18/22 2040

## 2022-11-18 NOTE — ED Notes (Signed)
Pt discharged home after verbalizing understanding of discharge instructions; nad noted. 

## 2022-11-18 NOTE — ED Triage Notes (Signed)
Patient here POV from Home.  Endorses SOB that began approximately 1 Month intermittently. Worsened over the Past few days. Some Palpitations and Chest tightness.   Sent by PCP for Abnormal ECG.   NAD Noted during Triage. A&Ox4. GCS 15. Ambulatory.

## 2022-11-18 NOTE — Discharge Instructions (Addendum)
Your workup today is overall reassuring.  No concerning cause of your shortness of breath.  Continue taking the Lasix you were prescribed today.  Stop taking the potassium supplement until you have repeat blood work done within the week to ensure your potassium has improved.  We have also sent in Augmentin for your sinus infection.  Also perform a sinus rinse.  Have attached information regarding that for you.  For any concerning symptoms return to the emergency department.

## 2022-11-25 NOTE — Progress Notes (Unsigned)
Chief Complaint:   OBESITY Melanie Reeves is here to discuss her progress with her obesity treatment plan along with follow-up of her obesity related diagnoses. Melanie Reeves is on practicing portion control and making smarter food choices, such as increasing vegetables and decreasing simple carbohydrates and states she is following her eating plan approximately 80% of the time. Melanie Reeves states she is doing 0 minutes 0 times per week.  Today's visit was #: 47 Starting weight: 294 lbs Starting date: 11/29/2021 Today's weight: 276 lbs Today's date: 11/12/2022 Total lbs lost to date: 18 Total lbs lost since last in-office visit: 0  Interim History: Melanie Reeves has been working on portion control and [employer], and she is trying to increase her protein intake.  She is retaining fluid today, and she has had more sodium in her diet recently.  Subjective:   1. Essential hypertension Melanie Reeves's blood pressure is elevated again.  Her sodium has been elevated and she is retaining fluid today.  She denies any shortness of breath or orthopnea.  She has not been using her CPAP.  2. Type 2 diabetes mellitus with other specified complication, with long-term current use of insulin (HCC) Melanie Reeves is working on her diet, and she is stable on [place].  She denies nausea or vomiting.  3. Vitamin D deficiency Melanie Reeves is stable on vitamin D, and she denies nausea, vomiting, or muscle weakness.  Assessment/Plan:   1. Essential hypertension Melanie Reeves is to work on decreasing sodium and she was educated about the importance of using her CPAP nightly to control her blood pressure and decrease the risk of heart failure or myocardial infraction.  2. Type 2 diabetes mellitus with other specified complication, with long-term current use of insulin (HCC) We will refill Mounjaro for 1 month.  Melanie Reeves will continue with her diet and will follow-up with her PCP as directed.  - tirzepatide (MOUNJARO) 15 MG/0.5ML Pen; Inject 15 mg into  the skin once a week.  Dispense: 6 mL; Refill: 0  3. Vitamin D deficiency Melanie Reeves will continue prescription vitamin D, and we will refill for 1 month.  - Vitamin D, Ergocalciferol, (DRISDOL) 1.25 MG (50000 UNIT) CAPS capsule; Take 1 capsule by mouth every 7 days.  Dispense: 4 capsule; Refill: 0  4. BMI 50.0-59.9, adult ([place])  5. Obesity, Beginning BMI 55.55 Melanie Reeves is currently in the action stage of change. As such, her goal is to continue with weight loss efforts. She has agreed to practicing portion control and making smarter food choices, such as increasing vegetables and decreasing simple carbohydrates.   Behavioral modification strategies: increasing water intake and decreasing sodium intake.  Melanie Reeves has agreed to follow-up with our clinic in 4 weeks. She was informed of the importance of frequent follow-up visits to maximize her success with intensive lifestyle modifications for her multiple health conditions.   Objective:   Blood pressure (!) 159/76, pulse 97, temperature (!) 97.4 F (36.3 C), height 5' 1"$  (1.549 m), weight 276 lb (125.2 kg), SpO2 94 %. Body mass index is 52.15 kg/m.  General: Cooperative, alert, well developed, in no acute distress. HEENT: Conjunctivae and lids unremarkable. Cardiovascular: Regular rhythm.  Lungs: Normal work of breathing. Neurologic: No focal deficits.   Lab Results  Component Value Date   CREATININE 0.88 11/18/2022   BUN 34 (H) 11/18/2022   NA 137 11/18/2022   K 5.3 (H) 11/18/2022   CL 103 11/18/2022   CO2 21 (L) 11/18/2022   Lab Results  Component Value Date  ALT 27 10/16/2021   AST 32 10/16/2021   ALKPHOS 69 10/16/2021   BILITOT <0.2 10/16/2021   Lab Results  Component Value Date   HGBA1C 7.2 (H) 03/11/2022   HGBA1C 6.9 (H) 11/26/2021   HGBA1C 6.8 (H) 10/16/2021   HGBA1C 6.5 07/15/2021   HGBA1C 6.5 03/19/2021   Lab Results  Component Value Date   INSULIN 34.8 (H) 10/16/2021   Lab Results  Component Value Date   TSH  1.217 11/24/2020   Lab Results  Component Value Date   CHOL 152 10/16/2021   HDL 46 10/16/2021   LDLCALC 66 10/16/2021   LDLDIRECT 72.0 02/28/2020   TRIG 247 (H) 10/16/2021   CHOLHDL 3 07/15/2021   Lab Results  Component Value Date   VD25OH 43.1 10/16/2021   VD25OH 36.2 06/05/2021   VD25OH 19.7 (L) 11/29/2020   Lab Results  Component Value Date   WBC 11.7 (H) 11/18/2022   HGB 13.6 11/18/2022   HCT 42.9 11/18/2022   MCV 85.1 11/18/2022   PLT 256 11/18/2022   Lab Results  Component Value Date   FERRITIN 264 10/25/2020   Attestation Statements:   Reviewed by clinician on day of visit: allergies, medications, problem list, medical history, surgical history, family history, social history, and previous encounter notes.   I, Trixie Dredge, am acting as transcriptionist for Dennard Nip, MD.  I have reviewed the above documentation for accuracy and completeness, and I agree with the above. -  Dennard Nip, MD

## 2022-11-27 NOTE — Progress Notes (Signed)
TeleHealth Visit:  This visit was completed with telemedicine (audio/video) technology. Melanie Reeves has verbally consented to this TeleHealth visit. The patient is located at home, the provider is located at home. The participants in this visit include the listed provider and patient. The visit was conducted today via MyChart video.  OBESITY Melanie Reeves is here to discuss her progress with her obesity treatment plan along with follow-up of her obesity related diagnoses.   Today's visit was # 33 Starting weight: 294 lbs Starting date: 11/29/2020 Weight at last in office visit: 276 lbs on 11/12/22 Total weight loss: 18 lbs at last in office visit on 11/12/22. Today's reported weight: 270 lbs    Nutrition Plan: practicing portion control and making smarter food choices, such as increasing vegetables and decreasing simple carbohydrates.  Current exercise:  none  Interim History:  Eating 2 meals per day at least. Has protein at these meals. She is trying to avoid carbs. Notices blood sugars increase with more carbs.  Feels she is eating modified low carb. She is using no sugar salad dressing. Portions can still be large at her meals. Trying to avoid junk food- chips, sweets. Snacks on cheese sticks mostly, sometimes popcorn..  Drinks coffee with no sugar sweetener and water. She is trying to move more and stand more- using walker less. She  says that her mobility has improved.  Assessment/Plan:  1. Type II Diabetes HgbA1c  at goal. Last A1c was 6.4 on 10/01/22 at PCP. Sees Dr. Hartford Poli (endo). CBGs: CGM - 97% in range Episodes of hypoglycemia: yes - rare (below 70). Medication(s): Toujeo 100 mg, Mounjaro 15 mg weekly, cangliflozlin-metformin [phone] mg 2 pills at dinner.   Lab Results  Component Value Date   HGBA1C 7.2 (H) 03/11/2022   HGBA1C 6.9 (H) 11/26/2021   HGBA1C 6.8 (H) 10/16/2021   Lab Results  Component Value Date   MICROALBUR <0.7 07/15/2021   LDLCALC 66 10/16/2021    CREATININE 0.88 11/18/2022   Lab Results  Component Value Date   GFR 58.46 (L) 03/11/2022   GFR 60.03 11/26/2021   GFR 56.66 (L) 07/15/2021    Plan: Refill Mounjaro 15 mg weekly-12-monthsupply. Refill Libre sensors. Continue all other medications at current dosages. Follow-up with Dr. LHartford Polias recommended.   2. Vitamin D Deficiency Vitamin D is not at goal of 50.  Most recent vitamin D level was 42 on 10/01/2022 in care everywhere.. She is on  prescription ergocalciferol 50,000 IU weekly. Lab Results  Component Value Date   VD25OH 43.1 10/16/2021   VD25OH 36.2 06/05/2021   VD25OH 19.7 (L) 11/29/2020    Plan: Refill prescription vitamin D 50,000 IU weekly.   3. Obesity: Current BMI 52.1 Melanie Reeves currently in the action stage of change. As such, her goal is to continue with weight loss efforts.  She has agreed to practicing portion control and making smarter food choices, such as increasing vegetables and decreasing simple carbohydrates.  1.  Continue to limit carbohydrates.  Exercise goals: Older adults should determine their level of effort for physical activity relative to their level of fitness.  Increase activity as tolerated.  Behavioral modification strategies: increasing lean protein intake, decreasing simple carbohydrates , increase water intake, and Work on smaller portions.  Melanie Reeves agreed to follow-up with our clinic in 5 weeks.   No orders of the defined types were placed in this encounter.   Medications Discontinued During This Encounter  Medication Reason   Continuous Blood Gluc Sensor (FREESTYLE LIBRE 3  SENSOR) MISC Reorder   Vitamin D, Ergocalciferol, (DRISDOL) 1.25 MG (50000 UNIT) CAPS capsule Reorder   tirzepatide (MOUNJARO) 15 MG/0.5ML Pen Reorder     Meds ordered this encounter  Medications   Vitamin D, Ergocalciferol, (DRISDOL) 1.25 MG (50000 UNIT) CAPS capsule    Sig: Take 1 capsule by mouth every 7 days.    Dispense:  4 capsule     Refill:  0    Order Specific Question:   Supervising Provider    Answer:   Loyal Gambler E [[id number]]   Continuous Blood Gluc Sensor (FREESTYLE LIBRE 3 SENSOR) MISC    Sig: Place 1 sensor on the skin every 14 days. Use to check glucose continuously    Dispense:  2 each    Refill:  0    Order Specific Question:   Supervising Provider    Answer:   Dell Ponto [[id number]]   tirzepatide (MOUNJARO) 15 MG/0.5ML Pen    Sig: Inject 15 mg into the skin once a week.    Dispense:  6 mL    Refill:  0    Order Specific Question:   Supervising Provider    Answer:   Dell Ponto [[id number]]      Objective:   VITALS: Per patient if applicable, see vitals. GENERAL: Alert and in no acute distress. CARDIOPULMONARY: No increased WOB. Speaking in clear sentences.  PSYCH: Pleasant and cooperative. Speech normal rate and rhythm. Affect is appropriate. Insight and judgement are appropriate. Attention is focused, linear, and appropriate.  NEURO: Oriented as arrived to appointment on time with no prompting.   Lab Results  Component Value Date   CREATININE 0.88 11/18/2022   BUN 34 (H) 11/18/2022   NA 137 11/18/2022   K 5.3 (H) 11/18/2022   CL 103 11/18/2022   CO2 21 (L) 11/18/2022   Lab Results  Component Value Date   ALT 27 10/16/2021   AST 32 10/16/2021   ALKPHOS 69 10/16/2021   BILITOT <0.2 10/16/2021   Lab Results  Component Value Date   HGBA1C 7.2 (H) 03/11/2022   HGBA1C 6.9 (H) 11/26/2021   HGBA1C 6.8 (H) 10/16/2021   HGBA1C 6.5 07/15/2021   HGBA1C 6.5 03/19/2021   Lab Results  Component Value Date   INSULIN 34.8 (H) 10/16/2021   Lab Results  Component Value Date   TSH 1.217 11/24/2020   Lab Results  Component Value Date   CHOL 152 10/16/2021   HDL 46 10/16/2021   LDLCALC 66 10/16/2021   LDLDIRECT 72.0 02/28/2020   TRIG 247 (H) 10/16/2021   CHOLHDL 3 07/15/2021   Lab Results  Component Value Date   WBC 11.7 (H) 11/18/2022   HGB 13.6 11/18/2022   HCT 42.9 11/18/2022   MCV 85.1  11/18/2022   PLT 256 11/18/2022   Lab Results  Component Value Date   FERRITIN 264 10/25/2020   Lab Results  Component Value Date   VD25OH 43.1 10/16/2021   VD25OH 36.2 06/05/2021   VD25OH 19.7 (L) 11/29/2020    Attestation Statements:   Reviewed by clinician on day of visit: allergies, medications, problem list, medical history, surgical history, family history, social history, and previous encounter notes.   This was prepared with the assistance of [employer].  Occasional wrong-word or sound-a-like substitutions may have occurred due to the inherent limitations of voice recognition software.

## 2022-12-01 ENCOUNTER — Telehealth (INDEPENDENT_AMBULATORY_CARE_PROVIDER_SITE_OTHER): Payer: PPO | Admitting: Family Medicine

## 2022-12-01 ENCOUNTER — Encounter (INDEPENDENT_AMBULATORY_CARE_PROVIDER_SITE_OTHER): Payer: Self-pay | Admitting: Family Medicine

## 2022-12-01 ENCOUNTER — Other Ambulatory Visit: Payer: Self-pay | Admitting: Endocrinology

## 2022-12-01 DIAGNOSIS — E559 Vitamin D deficiency, unspecified: Secondary | ICD-10-CM

## 2022-12-01 DIAGNOSIS — E1169 Type 2 diabetes mellitus with other specified complication: Secondary | ICD-10-CM | POA: Diagnosis not present

## 2022-12-01 DIAGNOSIS — Z6841 Body Mass Index (BMI) 40.0 and over, adult: Secondary | ICD-10-CM

## 2022-12-01 DIAGNOSIS — Z7985 Long-term (current) use of injectable non-insulin antidiabetic drugs: Secondary | ICD-10-CM

## 2022-12-01 DIAGNOSIS — E669 Obesity, unspecified: Secondary | ICD-10-CM | POA: Diagnosis not present

## 2022-12-01 DIAGNOSIS — Z794 Long term (current) use of insulin: Secondary | ICD-10-CM

## 2022-12-01 MED ORDER — FREESTYLE LIBRE 3 SENSOR MISC: 0 refills | Status: DC

## 2022-12-01 MED ORDER — VITAMIN D (ERGOCALCIFEROL) 1.25 MG (50000 UNIT) PO CAPS: 50000.0000 [IU] | ORAL_CAPSULE | ORAL | 0 refills | Status: DC

## 2022-12-01 MED ORDER — TIRZEPATIDE 15 MG/0.5ML ~~LOC~~ SOAJ: 15.0000 mg | SUBCUTANEOUS | 0 refills | Status: DC

## 2022-12-24 ENCOUNTER — Encounter: Payer: Self-pay | Admitting: Physician Assistant

## 2022-12-24 ENCOUNTER — Ambulatory Visit: Payer: PPO | Attending: Physician Assistant | Admitting: Physician Assistant

## 2022-12-24 VITALS — BP 122/68 | HR 84 | Ht 61.0 in | Wt 271.8 lb

## 2022-12-24 DIAGNOSIS — I5032 Chronic diastolic (congestive) heart failure: Secondary | ICD-10-CM | POA: Diagnosis not present

## 2022-12-24 DIAGNOSIS — I447 Left bundle-branch block, unspecified: Secondary | ICD-10-CM

## 2022-12-24 DIAGNOSIS — E785 Hyperlipidemia, unspecified: Secondary | ICD-10-CM

## 2022-12-24 DIAGNOSIS — Z79899 Other long term (current) drug therapy: Secondary | ICD-10-CM

## 2022-12-24 DIAGNOSIS — R059 Cough, unspecified: Secondary | ICD-10-CM

## 2022-12-24 DIAGNOSIS — I480 Paroxysmal atrial fibrillation: Secondary | ICD-10-CM

## 2022-12-24 DIAGNOSIS — I10 Essential (primary) hypertension: Secondary | ICD-10-CM | POA: Diagnosis not present

## 2022-12-24 DIAGNOSIS — R600 Localized edema: Secondary | ICD-10-CM

## 2022-12-24 MED ORDER — FUROSEMIDE 40 MG PO TABS: 40.0000 mg | ORAL_TABLET | Freq: Every day | ORAL | 3 refills | Status: DC

## 2022-12-24 MED ORDER — FENOFIBRATE 160 MG PO TABS: 160.0000 mg | ORAL_TABLET | Freq: Every day | ORAL | 3 refills | Status: DC

## 2022-12-24 NOTE — Patient Instructions (Signed)
Medication Instructions:  Take lasix 40 mg daily, may take an extra 40 mg as needed Increase fenofibrate to 160 mg daily *If you need a refill on your cardiac medications before your next appointment, please call your pharmacy*   Lab Work: None If you have labs (blood work) drawn today and your tests are completely normal, you will receive your results only by: [place] (if you have MyChart) OR A paper copy in the mail If you have any lab test that is abnormal or we need to change your treatment, we will call you to review the results.   Testing/Procedures: Your physician has requested that you have an echocardiogram. Echocardiography is a painless test that uses sound waves to create images of your heart. It provides your doctor with information about the size and shape of your heart and how well your heart's chambers and valves are working. This procedure takes approximately one hour. There are no restrictions for this procedure. Please do NOT wear cologne, perfume, aftershave, or lotions (deodorant is allowed). Please arrive 15 minutes prior to your appointment time.    Follow-Up: At [facility], you and your health needs are our priority.  As part of our continuing mission to provide you with exceptional heart care, we have created designated Provider Care Teams.  These Care Teams include your primary Cardiologist (physician) and Advanced Practice Providers (APPs -  Physician Assistants and Nurse Practitioners) who all work together to provide you with the care you need, when you need it.   Your next appointment:   3 month(s)  Provider:   Dorris Carnes, MD  or APP  Other Instructions Weight every morning after using the restroom before breakfast and call and let us know if you have a weight gain of 2 pounds or more overnight or 5 pounds or more in a week   Low-Sodium Eating Plan Sodium, which is an element that makes up salt, helps you maintain a healthy balance of  fluids in your body. Too much sodium can increase your blood pressure and cause fluid and waste to be held in your body. Your health care provider or dietitian may recommend following this plan if you have high blood pressure (hypertension), kidney disease, liver disease, or heart failure. Eating less sodium can help lower your blood pressure, reduce swelling, and protect your heart, liver, and kidneys. What are tips for following this plan? Reading food labels The Nutrition Facts label lists the amount of sodium in one serving of the food. If you eat more than one serving, you must multiply the listed amount of sodium by the number of servings. Choose foods with less than 140 mg of sodium per serving. Avoid foods with 300 mg of sodium or more per serving. Shopping  Look for lower-sodium products, often labeled as "low-sodium" or "no salt added." Always check the sodium content, even if foods are labeled as "unsalted" or "no salt added." Buy fresh foods. Avoid canned foods and pre-made or frozen meals. Avoid canned, cured, or processed meats. Buy breads that have less than 80 mg of sodium per slice. Cooking  Eat more home-cooked food and less restaurant, buffet, and fast food. Avoid adding salt when cooking. Use salt-free seasonings or herbs instead of table salt or sea salt. Check with your health care provider or pharmacist before using salt substitutes. Cook with plant-based oils, such as canola, sunflower, or olive oil. Meal planning When eating at a restaurant, ask that your food be prepared with less  salt or no salt, if possible. Avoid dishes labeled as brined, pickled, cured, smoked, or made with soy sauce, miso, or teriyaki sauce. Avoid foods that contain MSG (monosodium glutamate). MSG is sometimes added to [place] food, bouillon, and some canned foods. Make meals that can be grilled, baked, poached, roasted, or steamed. These are generally made with less sodium. General  information Most people on this plan should limit their sodium intake to 1,500-2,000 mg (milligrams) of sodium each day. What foods should I eat? Fruits Fresh, frozen, or canned fruit. Fruit juice. Vegetables Fresh or frozen vegetables. "No salt added" canned vegetables. "No salt added" tomato sauce and paste. Low-sodium or reduced-sodium tomato and vegetable juice. Grains Low-sodium cereals, including oats, puffed wheat and rice, and shredded wheat. Low-sodium crackers. Unsalted rice. Unsalted pasta. Low-sodium bread. Whole-grain breads and whole-grain pasta. Meats and other proteins Fresh or frozen (no salt added) meat, poultry, seafood, and fish. Low-sodium canned tuna and salmon. Unsalted nuts. Dried peas, beans, and lentils without added salt. Unsalted canned beans. Eggs. Unsalted nut butters. Dairy Milk. Soy milk. Cheese that is naturally low in sodium, such as ricotta cheese, fresh mozzarella, or Swiss cheese. Low-sodium or reduced-sodium cheese. Cream cheese. Yogurt. Seasonings and condiments Fresh and dried herbs and spices. Salt-free seasonings. Low-sodium mustard and ketchup. Sodium-free salad dressing. Sodium-free light mayonnaise. Fresh or refrigerated horseradish. Lemon juice. Vinegar. Other foods Homemade, reduced-sodium, or low-sodium soups. Unsalted popcorn and pretzels. Low-salt or salt-free chips. The items listed above may not be a complete list of foods and beverages you can eat. Contact a dietitian for more information. What foods should I avoid? Vegetables Sauerkraut, pickled vegetables, and relishes. Olives. [place] fries. Onion rings. Regular canned vegetables (not low-sodium or reduced-sodium). Regular canned tomato sauce and paste (not low-sodium or reduced-sodium). Regular tomato and vegetable juice (not low-sodium or reduced-sodium). Frozen vegetables in sauces. Grains Instant hot cereals. Bread stuffing, pancake, and biscuit mixes. Croutons. Seasoned rice or pasta  mixes. Noodle soup cups. Boxed or frozen macaroni and cheese. Regular salted crackers. Self-rising flour. Meats and other proteins Meat or fish that is salted, canned, smoked, spiced, or pickled. Precooked or cured meat, such as sausages or meat loaves. Berniece Salines. Ham. Pepperoni. Hot dogs. Corned beef. Chipped beef. Salt pork. Jerky. Pickled herring. Anchovies and sardines. Regular canned tuna. Salted nuts. Dairy Processed cheese and cheese spreads. Hard cheeses. Cheese curds. Blue cheese. Feta cheese. String cheese. Regular cottage cheese. Buttermilk. Canned milk. Fats and oils Salted butter. Regular margarine. Ghee. Bacon fat. Seasonings and condiments Onion salt, garlic salt, seasoned salt, table salt, and sea salt. Canned and packaged gravies. Worcestershire sauce. Tartar sauce. Barbecue sauce. Teriyaki sauce. Soy sauce, including reduced-sodium. Steak sauce. Fish sauce. Oyster sauce. Cocktail sauce. Horseradish that you find on the shelf. Regular ketchup and mustard. Meat flavorings and tenderizers. Bouillon cubes. Hot sauce. Pre-made or packaged marinades. Pre-made or packaged taco seasonings. Relishes. Regular salad dressings. Salsa. Other foods Salted popcorn and pretzels. Corn chips and puffs. Potato and tortilla chips. Canned or dried soups. Pizza. Frozen entrees and pot pies. The items listed above may not be a complete list of foods and beverages you should avoid. Contact a dietitian for more information. Summary Eating less sodium can help lower your blood pressure, reduce swelling, and protect your heart, liver, and kidneys. Most people on this plan should limit their sodium intake to 1,500-2,000 mg (milligrams) of sodium each day. Canned, boxed, and frozen foods are high in sodium. Restaurant foods, fast foods, and pizza are  also very high in sodium. You also get sodium by adding salt to food. Try to cook at home, eat more fresh fruits and vegetables, and eat less fast food and canned,  processed, or prepared foods. This information is not intended to replace advice given to you by your health care provider. Make sure you discuss any questions you have with your health care provider. Document Revised: 09/05/2019 Document Reviewed: 08/31/2019 Elsevier Patient Education  [place] Many factors influence your heart health, including eating and exercise habits. Heart health is also called coronary health. Coronary risk increases with abnormal blood fat (lipid) levels. A heart-healthy eating plan includes limiting unhealthy fats, increasing healthy fats, limiting salt (sodium) intake, and making other diet and lifestyle changes. What is my plan? Your health care provider may recommend that: You limit your fat intake to _________% or less of your total calories each day. You limit your saturated fat intake to _________% or less of your total calories each day. You limit the amount of cholesterol in your diet to less than _________ mg per day. You limit the amount of sodium in your diet to less than _________ mg per day. What are tips for following this plan? Cooking Cook foods using methods other than frying. Baking, boiling, grilling, and broiling are all good options. Other ways to reduce fat include: Removing the skin from poultry. Removing all visible fats from meats. Steaming vegetables in water or broth. Meal planning  At meals, imagine dividing your plate into fourths: Fill one-half of your plate with vegetables and green salads. Fill one-fourth of your plate with whole grains. Fill one-fourth of your plate with lean protein foods. Eat 2-4 cups of vegetables per day. One cup of vegetables equals 1 cup (91 g) broccoli or cauliflower florets, 2 medium carrots, 1 large bell pepper, 1 large sweet potato, 1 large tomato, 1 medium white potato, 2 cups (150 g) raw leafy greens. Eat 1-2 cups of fruit per day. One cup of fruit equals 1 small  apple, 1 large banana, 1 cup (237 g) mixed fruit, 1 large orange,  cup (82 g) dried fruit, 1 cup (240 mL) 100% fruit juice. Eat more foods that contain soluble fiber. Examples include apples, broccoli, carrots, beans, peas, and barley. Aim to get 25-30 g of fiber per day. Increase your consumption of legumes, nuts, and seeds to 4-5 servings per week. One serving of dried beans or legumes equals  cup (90 g) cooked, 1 serving of nuts is  oz (12 almonds, 24 pistachios, or 7 walnut halves), and 1 serving of seeds equals  oz (8 g). Fats Choose healthy fats more often. Choose monounsaturated and polyunsaturated fats, such as olive and canola oils, avocado oil, flaxseeds, walnuts, almonds, and seeds. Eat more omega-3 fats. Choose salmon, mackerel, sardines, tuna, flaxseed oil, and ground flaxseeds. Aim to eat fish at least 2 times each week. Check food labels carefully to identify foods with trans fats or high amounts of saturated fat. Limit saturated fats. These are found in animal products, such as meats, butter, and cream. Plant sources of saturated fats include palm oil, palm kernel oil, and coconut oil. Avoid foods with partially hydrogenated oils in them. These contain trans fats. Examples are stick margarine, some tub margarines, cookies, crackers, and other baked goods. Avoid fried foods. General information Eat more home-cooked food and less restaurant, buffet, and fast food. Limit or avoid alcohol. Limit foods that are high in added sugar and  simple starches such as foods made using white refined flour (white breads, pastries, sweets). Lose weight if you are overweight. Losing just 5-10% of your body weight can help your overall health and prevent diseases such as diabetes and heart disease. Monitor your sodium intake, especially if you have high blood pressure. Talk with your health care provider about your sodium intake. Try to incorporate more vegetarian meals weekly. What foods should I  eat? Fruits All fresh, canned (in natural juice), or frozen fruits. Vegetables Fresh or frozen vegetables (raw, steamed, roasted, or grilled). Green salads. Grains Most grains. Choose whole wheat and whole grains most of the time. Rice and pasta, including brown rice and pastas made with whole wheat. Meats and other proteins Lean, well-trimmed beef, veal, pork, and lamb. Chicken and [place] without skin. All fish and shellfish. Wild duck, rabbit, pheasant, and venison. Egg whites or low-cholesterol egg substitutes. Dried beans, peas, lentils, and tofu. Seeds and most nuts. Dairy Low-fat or nonfat cheeses, including ricotta and mozzarella. Skim or 1% milk (liquid, powdered, or evaporated). Buttermilk made with low-fat milk. Nonfat or low-fat yogurt. Fats and oils Non-hydrogenated (trans-free) margarines. Vegetable oils, including soybean, sesame, sunflower, olive, avocado, peanut, safflower, corn, canola, and cottonseed. Salad dressings or mayonnaise made with a vegetable oil. Beverages Water (mineral or sparkling). Coffee and tea. Unsweetened ice tea. Diet beverages. Sweets and desserts Sherbet, gelatin, and fruit ice. Small amounts of dark chocolate. Limit all sweets and desserts. Seasonings and condiments All seasonings and condiments. The items listed above may not be a complete list of foods and beverages you can eat. Contact a dietitian for more options. What foods should I avoid? Fruits Canned fruit in heavy syrup. Fruit in cream or butter sauce. Fried fruit. Limit coconut. Vegetables Vegetables cooked in cheese, cream, or butter sauce. Fried vegetables. Grains Breads made with saturated or trans fats, oils, or whole milk. Croissants. Sweet rolls. Donuts. High-fat crackers, such as cheese crackers and chips. Meats and other proteins Fatty meats, such as hot dogs, ribs, sausage, bacon, rib-eye roast or steak. High-fat deli meats, such as salami and bologna. Caviar. Domestic duck and  goose. Organ meats, such as liver. Dairy Cream, sour cream, cream cheese, and creamed cottage cheese. Whole-milk cheeses. Whole or 2% milk (liquid, evaporated, or condensed). Whole buttermilk. Cream sauce or high-fat cheese sauce. Whole-milk yogurt. Fats and oils Meat fat, or shortening. Cocoa butter, hydrogenated oils, palm oil, coconut oil, palm kernel oil. Solid fats and shortenings, including bacon fat, salt pork, lard, and butter. Nondairy cream substitutes. Salad dressings with cheese or sour cream. Beverages Regular sodas and any drinks with added sugar. Sweets and desserts Frosting. Pudding. Cookies. Cakes. Pies. Milk chocolate or white chocolate. Buttered syrups. Full-fat ice cream or ice cream drinks. The items listed above may not be a complete list of foods and beverages to avoid. Contact a dietitian for more information. Summary Heart-healthy meal planning includes limiting unhealthy fats, increasing healthy fats, limiting salt (sodium) intake and making other diet and lifestyle changes. Lose weight if you are overweight. Losing just 5-10% of your body weight can help your overall health and prevent diseases such as diabetes and heart disease. Focus on eating a balance of foods, including fruits and vegetables, low-fat or nonfat dairy, lean protein, nuts and legumes, whole grains, and heart-healthy oils and fats. This information is not intended to replace advice given to you by your health care provider. Make sure you discuss any questions you have with your health care provider.  Document Revised: 11/04/2021 Document Reviewed: 11/04/2021 Elsevier Patient Education  [place].

## 2022-12-24 NOTE — Progress Notes (Signed)
Office Visit    Patient Name: Melanie Reeves Date of Encounter: 12/24/2022  PCP:  Chesley Noon, MD   [place]  Cardiologist:  Dorris Carnes, MD  Advanced Practice Provider:  No care team member to display Electrophysiologist:  None    HPI    Melanie Reeves is a 72 y.o. female with past medical history of anxiety, atrial fibrillation, chest pain, depression, diabetes mellitus, hypertension, hyperlipidemia, morbid obesity, edema, stage III kidney disease presents today for follow-up appointment.  Patient was last seen by Dr. Harrington Challenger 06/24/2022.  She had a Myoview in 2018 that was normal.  Echocardiogram February 2022 was done showing normal LVEF and mild diastolic dysfunction.  Denied palpitations, chest pain.  Did have a cough which was productive.  Denied fevers.  Patient was recently in the ER for evaluation of shortness of breath as well as sinus congestion intermittently going on for about a month.  Symptoms have been worse over the last few days prior to ER visit.  Prior to EKG finding plan was to start back on Lasix.  Stated her sinus congestion was not as bad as it had been in the past.  No cough.  Afebrile.  Endorsed bilateral peripheral edema.  Stated she gained about 3 pounds and believes it was fluid.  Does have a history of CHF however had been off Lasix for quite some time.  Reported baseline sleeps in recliner due to history of frozen shoulder.  Initial troponin 3.  EKG without acute ischemic changes.  No suspicion for ACS.  Was given Lasix.  Today, she tells me that she felt like her heart was pounding and she was having some shortness of breath because of that.  She was not taking her fluid pill.  She routinely sleeps in a recliner.  She is lost some weight over the last 6 months or so (26 pounds).  [facility].  She is trying to get more exercise.  She states her diet is about 80/20.  She  has been taking Lasix 40 mg daily with some improvement of lower extremity edema.  We discussed compression and use of elastic therapy in [place].  She is asked for some refills today. She went to a wedding recently and was able to dance most of the night but paid for the next morning with sore joints. If palpitations return would consider a monitor.  Reports no chest pain, pressure, or tightness. No edema, orthopnea, PND.   Past Medical History    Past Medical History:  Diagnosis Date   Abnormal electrocardiogram 08/14/2014   LBBB   Anemia    Anxiety    Arthritis    Atrial fibrillation (HCC)    Bilateral swelling of feet    Chest pain    Constipation    Depression    Diabetes mellitus ([place])    Difficulty walking    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   Qualifier: Diagnosis of  By: Aline Brochure MD, Stanley     Dyspnea 08/14/2014   Edema    Gallbladder problem    GERD (gastroesophageal reflux disease)    Hyperlipidemia    Hypertension    LOW BACK PAIN 12/20/2009   Qualifier: Diagnosis of  By: Aline Brochure MD, Stanley     Morbid obesity ([place])    OSA (obstructive sleep apnea) 08/16/2014   Osteoarthritis    Other fatigue    Palpitations  Seasonal allergies    Shortness of breath    Shortness of breath on exertion    Sinusitis    SPINAL STENOSIS 12/20/2009   Qualifier: Diagnosis of  By: Aline Brochure MD, Dorothyann Peng     Stage 3 chronic kidney disease [facility])    Past Surgical History:  Procedure Laterality Date   CHOLECYSTECTOMY     COLONOSCOPY WITH PROPOFOL N/A 11/29/2014   Procedure: COLONOSCOPY WITH PROPOFOL;  Surgeon: Arta Silence, MD;  Location: [facility] END[facility]COPY;  Service: Endoscopy;  Laterality: N/A;   SKIN CANCER EXCISION     TONSILLECTOMY      Allergies  Allergies  Allergen Reactions   Simvastatin Other (See Comments)    Other reaction(s): leg weakness Other reaction(s): leg weakness   Sulfa Antibiotics Other (See Comments)    Cant remember Other reaction(s): as a child      EKGs/Labs/Other Studies Reviewed:   The following studies were reviewed today:  11/2020 Echo   1. Left ventricular ejection fraction, by estimation, is 55 to 60%. The left ventricle has normal function. The left ventricle has no regional wall motion abnormalities. There is moderate concentric left ventricular hypertrophy. Left ventricular diastolic parameters are consistent with Grade I diastolic dysfunction (impaired relaxation). 2. Right ventricular systolic function is normal. The right ventricular size is normal. There is normal pulmonary artery systolic pressure. 3. The mitral valve is normal in structure. Mild mitral valve regurgitation. No evidence of mitral stenosis. 4. The aortic valve is tricuspid. Aortic valve regurgitation is not visualized. No aortic stenosis is present. Aortic valve mean gradient measures 9.0 mmHg. 5. The inferior vena cava is normal in size with greater than 50% respiratory variability, suggesting right atrial pressure of 3 mmHg. Comparison(s): A prior study was performed on 09/20/2017. No significant change from prior study. Prior images reviewed side by side.    EKG:  EKG is not ordered today.    Recent Labs: 06/24/2022: NT-Pro BNP 86 11/18/2022: B Natriuretic Peptide 28.9; BUN 34; Creatinine, Ser 0.88; Hemoglobin 13.6; Magnesium 2.0; Platelets 256; Potassium 5.3; Sodium 137  Recent Lipid Panel    Component Value Date/Time   CHOL 152 10/16/2021 1247   TRIG 247 (H) 10/16/2021 1247   HDL 46 10/16/2021 1247   CHOLHDL 3 07/15/2021 1054   VLDL 30.6 07/15/2021 1054   LDLCALC 66 10/16/2021 1247   LDLDIRECT 72.0 02/28/2020 1124     Home Medications   Current Meds  Medication Sig   amLODipine (NORVASC) 5 MG tablet TAKE 1 TABLET (5 MG TOTAL) BY MOUTH DAILY.   atenolol (TENORMIN) 25 MG tablet Take 1 tablet (25 mg total) by mouth daily.   B-D ULTRAFINE III SHORT PEN 31G X 8 MM MISC USE TO INJECT MEDICATION 5 TIMES DAILY.   Blood Glucose  Monitoring Suppl (ONETOUCH VERIO) w/Device KIT UAD to monitor glucose tid; Dx: E11.65   Canagliflozin-metFORMIN HCl [phone] MG TABS Take 2 tablets by mouth daily with supper.   cetirizine (ZYRTEC) 10 MG chewable tablet Chew 10 mg by mouth daily.   Continuous Blood Gluc Sensor (FREESTYLE LIBRE 3 SENSOR) MISC Place 1 sensor on the skin every 14 days. Use to check glucose continuously   cyclobenzaprine (FLEXERIL) 5 MG tablet Take 5 mg by mouth 3 (three) times daily as needed for muscle spasms.   DULoxetine (CYMBALTA) 30 MG capsule TAKE 1 CAPSULE BY MOUTH DAILY. TAKE IN ADDITION TO THE 60 MG   DULoxetine (CYMBALTA) 60 MG capsule Take 60 mg by mouth daily. Taking in the AM  fenofibrate 160 MG tablet Take 1 tablet (160 mg total) by mouth daily.   glucose blood (ONETOUCH ULTRA) test strip USE AS INSTRUCTED TO CHECK BLOOD SUGAR THREE TIMES DAILY.   HYDROcodone-acetaminophen (NORCO/VICODIN) 5-325 MG tablet Take 1 tablet by mouth every 6 (six) hours as needed for moderate pain.   ibuprofen (ADVIL) 200 MG tablet Take 400 mg by mouth every 6 (six) hours as needed for fever.   Omega-3 Fatty Acids (FISH OIL) 1000 MG CAPS Take 2,000 mg by mouth daily.   pravastatin (PRAVACHOL) 40 MG tablet TAKE 1 TABLET BY MOUTH EVERY DAY   pregabalin (LYRICA) 100 MG capsule Take 1 capsule (100 mg total) by mouth 2 (two) times daily.   tirzepatide (MOUNJARO) 15 MG/0.5ML Pen Inject 15 mg into the skin once a week.   TOUJEO MAX SOLOSTAR 300 UNIT/ML Solostar Pen INJECT 140 UNITS INTO THE SKIN DAILY. . (Patient taking differently: 100 Units.)   Vitamin D, Ergocalciferol, (DRISDOL) 1.25 MG (50000 UNIT) CAPS capsule Take 1 capsule by mouth every 7 days.   XARELTO 20 MG TABS tablet TAKE 1 TABLET BY MOUTH DAILY WITH SUPPER   [DISCONTINUED] fenofibrate (TRICOR) 145 MG tablet TAKE 1 TABLET BY MOUTH EVERY DAY   [DISCONTINUED] furosemide (LASIX) 40 MG tablet Take 40 mg by mouth.     Review of Systems      All other systems reviewed  and are otherwise negative except as noted above.  Physical Exam    VS:  BP 122/68   Pulse 84   Ht '5\' 1"'$  (1.549 m)   Wt 271 lb 12.8 oz (123.3 kg)   SpO2 95%   BMI 51.36 kg/m  , BMI Body mass index is 51.36 kg/m.  Wt Readings from Last 3 Encounters:  12/24/22 271 lb 12.8 oz (123.3 kg)  11/18/22 276 lb 0.3 oz (125.2 kg)  11/12/22 276 lb (125.2 kg)     GEN: Well nourished, well developed, in no acute distress. HEENT: normal. Neck: Supple, no JVD, carotid bruits, or masses. Cardiac: RRR, no murmurs, rubs, or gallops. No clubbing, cyanosis, edema.  Radials/PT 2+ and equal bilaterally.  Respiratory:  Respirations regular and unlabored, clear to auscultation bilaterally. GI: Soft, nontender, nondistended. MS: No deformity or atrophy. Skin: Warm and dry, no rash. Venous stasis changes on her LL bilaterally Neuro:  Strength and sensation are intact. Psych: Normal affect.  Assessment & Plan    PAF -NSR today -One isolated event of shortness of breath and palpitations -Continue current medications including Xarelto 20 mg daily and atenolol 25 mg daily -HR 84 today  HTN -Well-controlled today -Continue amlodipine 5 mg daily, atenolol 25 mg daily, fenofibrate 145 mg daily, fish oil 2000 mg daily, pravastatin 40 mg daily, Xarelto 20 mg daily, furosemide 40 mg daily (extra 40 mg as needed for lower extremity edema)  Chronic diastolic CHF -update echo -euvolemic on exam today -continue current medications  LBBB -stable  Hyperlipidemia -LDL 66, HDL 46, total cholesterol 152, triglycerides 247 -She is on fenofibrate and Xarelto and continues to struggle with hypertriglyceridemia -due for an updated lipid panel at next visit        Disposition: Follow up 3 months with Dorris Carnes, MD or APP.  Signed, Elgie Collard, PA-C 12/24/2022, 5:07 PM [place]

## 2022-12-29 ENCOUNTER — Other Ambulatory Visit (INDEPENDENT_AMBULATORY_CARE_PROVIDER_SITE_OTHER): Payer: Self-pay | Admitting: Family Medicine

## 2022-12-29 DIAGNOSIS — E559 Vitamin D deficiency, unspecified: Secondary | ICD-10-CM

## 2023-01-05 NOTE — Telephone Encounter (Signed)
ANOTHER PA SUBMITTED VIA [employer] FOR Melanie Reeves (Key: [id number])  [facility] has not yet replied to your PA request. You may close this dialog, return to your dashboard, and perform other tasks.  To check for an update later, open this request again from your dashboard.  If [facility] has not replied to your request within 24 hours please contact [facility] at [phone].

## 2023-01-06 ENCOUNTER — Encounter (INDEPENDENT_AMBULATORY_CARE_PROVIDER_SITE_OTHER): Payer: Self-pay | Admitting: Family Medicine

## 2023-01-06 ENCOUNTER — Ambulatory Visit (INDEPENDENT_AMBULATORY_CARE_PROVIDER_SITE_OTHER): Payer: PPO | Admitting: Family Medicine

## 2023-01-06 VITALS — BP 148/72 | HR 78 | Temp 97.4°F | Ht 61.0 in | Wt 271.0 lb

## 2023-01-06 DIAGNOSIS — Z7985 Long-term (current) use of injectable non-insulin antidiabetic drugs: Secondary | ICD-10-CM

## 2023-01-06 DIAGNOSIS — Z6841 Body Mass Index (BMI) 40.0 and over, adult: Secondary | ICD-10-CM | POA: Diagnosis not present

## 2023-01-06 DIAGNOSIS — Z794 Long term (current) use of insulin: Secondary | ICD-10-CM

## 2023-01-06 DIAGNOSIS — E1169 Type 2 diabetes mellitus with other specified complication: Secondary | ICD-10-CM | POA: Diagnosis not present

## 2023-01-06 MED ORDER — TIRZEPATIDE 15 MG/0.5ML ~~LOC~~ SOAJ: 15.0000 mg | SUBCUTANEOUS | 0 refills | Status: DC

## 2023-01-06 NOTE — Progress Notes (Unsigned)
Chief Complaint:   OBESITY Melanie Reeves is here to discuss her progress with her obesity treatment plan along with follow-up of her obesity related diagnoses. Melanie Reeves is on practicing portion control and making smarter food choices, such as increasing vegetables and decreasing simple carbohydrates and states she is following her eating plan approximately 70% of the time. Melanie Reeves states she is walking 2 times per week.  Today's visit was #: 97 Starting weight: 294 lbs Starting date: 11/29/2020 Today's weight: 271 lbs Today's date: 01/06/2023 Total lbs lost to date: 23 Total lbs lost since last in-office visit: 5  Interim History: Melanie Reeves continues to work on portion control and Psychologist, counselling.  She is staying more active in her home, but not doing any formal exercise.  Subjective:   1. Type 2 diabetes mellitus with other specified complication, with long-term current use of insulin (HCC) Melanie Reeves is working on her diet and she is taking Mounjaro.  Her last A1c had increased to 7.2 which is not at goal.  Assessment/Plan:   1. Type 2 diabetes mellitus with other specified complication, with long-term current use of insulin (HCC) Melanie Reeves will continue Mounjaro at 15 mg once weekly, and we will refill for 90 days.  - tirzepatide (MOUNJARO) 15 MG/0.5ML Pen; Inject 15 mg into the skin once a week.  Dispense: 6 mL; Refill: 0  2. BMI 50.0-59.9, adult ([place])  3. Obesity, Beginning BMI 55.55 Melanie Reeves is currently in the action stage of change. As such, her goal is to continue with weight loss efforts. She has agreed to practicing portion control and making smarter food choices, such as increasing vegetables and decreasing simple carbohydrates.   Exercise goals: As is.   Behavioral modification strategies: increasing lean protein intake.  Melanie Reeves has agreed to follow-up with our clinic in 8 weeks. She was informed of the importance of frequent follow-up visits to maximize her success with intensive  lifestyle modifications for her multiple health conditions.   Objective:   Blood pressure (!) 148/72, pulse 78, temperature (!) 97.4 F (36.3 C), height 5\' 1"  (1.549 m), weight 271 lb (122.9 kg), SpO2 95 %. Body mass index is 51.21 kg/m.  Lab Results  Component Value Date   CREATININE 0.88 11/18/2022   BUN 34 (H) 11/18/2022   NA 137 11/18/2022   K 5.3 (H) 11/18/2022   CL 103 11/18/2022   CO2 21 (L) 11/18/2022   Lab Results  Component Value Date   ALT 27 10/16/2021   AST 32 10/16/2021   ALKPHOS 69 10/16/2021   BILITOT <0.2 10/16/2021   Lab Results  Component Value Date   HGBA1C 7.2 (H) 03/11/2022   HGBA1C 6.9 (H) 11/26/2021   HGBA1C 6.8 (H) 10/16/2021   HGBA1C 6.5 07/15/2021   HGBA1C 6.5 03/19/2021   Lab Results  Component Value Date   INSULIN 34.8 (H) 10/16/2021   Lab Results  Component Value Date   TSH 1.217 11/24/2020   Lab Results  Component Value Date   CHOL 152 10/16/2021   HDL 46 10/16/2021   LDLCALC 66 10/16/2021   LDLDIRECT 72.0 02/28/2020   TRIG 247 (H) 10/16/2021   CHOLHDL 3 07/15/2021   Lab Results  Component Value Date   VD25OH 43.1 10/16/2021   VD25OH 36.2 06/05/2021   VD25OH 19.7 (L) 11/29/2020   Lab Results  Component Value Date   WBC 11.7 (H) 11/18/2022   HGB 13.6 11/18/2022   HCT 42.9 11/18/2022   MCV 85.1 11/18/2022   PLT 256  11/18/2022   Lab Results  Component Value Date   FERRITIN 264 10/25/2020   Attestation Statements:   Reviewed by clinician on day of visit: allergies, medications, problem list, medical history, surgical history, family history, social history, and previous encounter notes.  I have personally spent 35 minutes total time today in preparation, patient care, and documentation for this visit, including the following: review of clinical lab tests; review of medical tests/procedures/services.   I, Trixie Dredge, am acting as transcriptionist for Dennard Nip, MD.  I have reviewed the above documentation  for accuracy and completeness, and I agree with the above. -  Dennard Nip, MD

## 2023-01-08 MED ORDER — TIRZEPATIDE 15 MG/0.5ML ~~LOC~~ SOAJ: 15.0000 mg | SUBCUTANEOUS | 0 refills | Status: DC

## 2023-01-08 NOTE — Addendum Note (Signed)
Addended by: Lynnae Prude on: 01/08/2023 07:38 AM   Modules accepted: Orders

## 2023-01-12 ENCOUNTER — Other Ambulatory Visit (INDEPENDENT_AMBULATORY_CARE_PROVIDER_SITE_OTHER): Payer: Self-pay | Admitting: Family Medicine

## 2023-01-12 DIAGNOSIS — Z794 Long term (current) use of insulin: Secondary | ICD-10-CM

## 2023-01-12 NOTE — Telephone Encounter (Signed)
Outcome Approved on March 25 25-MAR-24:25-MAR-25 Mounjaro 15MG /0.5ML [place] SOPN Quantity:6;  Mychart message sent to patient.

## 2023-01-13 MED ORDER — FREESTYLE LIBRE 3 SENSOR MISC: 0 refills | Status: DC

## 2023-01-15 ENCOUNTER — Encounter (INDEPENDENT_AMBULATORY_CARE_PROVIDER_SITE_OTHER): Payer: Self-pay

## 2023-01-26 ENCOUNTER — Ambulatory Visit (HOSPITAL_COMMUNITY): Payer: PPO | Attending: Physician Assistant

## 2023-01-26 DIAGNOSIS — R600 Localized edema: Secondary | ICD-10-CM | POA: Diagnosis present

## 2023-01-26 LAB — ECHOCARDIOGRAM COMPLETE
AR max vel: 2.24 cm2
AV Area VTI: 2.32 cm2
AV Area mean vel: 2.22 cm2
AV Mean grad: 8.5 mmHg
AV Peak grad: 15.5 mmHg
Ao pk vel: 1.97 m/s
Area-P 1/2: 3.34 cm2
S' Lateral: 2.3 cm

## 2023-01-26 MED ORDER — PERFLUTREN LIPID MICROSPHERE
1.0000 mL | INTRAVENOUS | Status: AC | PRN
  Administered 2023-01-26: 2 mL via INTRAVENOUS

## 2023-01-29 NOTE — Progress Notes (Signed)
TeleHealth Visit:  This visit was completed with telemedicine (audio/video) technology. Melanie Reeves has verbally consented to this TeleHealth visit. The patient is located at home, the provider is located at home. The participants in this visit include the listed provider and patient. The visit was conducted today via MyChart video.  OBESITY Melanie Reeves is here to discuss her progress with her obesity treatment plan along with follow-up of her obesity related diagnoses.   Today's visit was # 35 Starting weight: 294 lbs Starting date: 11/29/20 Weight at last in office visit: 271 lbs on 01/06/23 Total weight loss: 23 lbs at last in office visit on 01/06/23. Today's reported weight (02/02/23): none reported  Nutrition Plan: practicing portion control and making smarter food choices, such as increasing vegetables and decreasing simple carbohydrates   Current exercise:  none  Interim History:  She has 2 meals per day and protein at all meals.  She tends to have a couple of cheese sticks rather than eating lunch.  She doesn't crave sweets anymore.  However she snacks on carbs such as nuts, corn chips, popcorn between meals.   Breakfast: Eggs, sausage Lunch-  cheese sticks x 2. Dinner: varies.  Sometimes eats as late as 11 PM.  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 She wants to be able to drive to go swim at the gym.  Eating all of the prescribed protein: no Skipping meals: Yes  Hunger controlled: well controlled. Cravings controlled:  poorly controlled.   Assessment/Plan:  1. Type 2 Diabetes Mellitus with other specified complication, with long-term current use of insulin HgbA1c is at goal. Last A1c was 6.4. Sees Dr. Shawnee Knapp ([facility])- endo. CBGs: Rarely goes over 250. Episodes of hypoglycemia: yes, Toujeo dose decreased by endo recently.  Medication(s): Mounjaro 15 mg SQ weekly, canagliflozin. [phone] tablets two tabs daily, Toujeo 80 units night. Last dose  Sunday was Sunday..   Lab Results  Component Value Date   HGBA1C 7.2 (H) 03/11/2022   HGBA1C 6.9 (H) 11/26/2021   HGBA1C 6.8 (H) 10/16/2021   Lab Results  Component Value Date   MICROALBUR <0.7 07/15/2021   LDLCALC 66 10/16/2021   CREATININE 0.88 11/18/2022   Lab Results  Component Value Date   GFR 58.46 (L) 03/11/2022   GFR 60.03 11/26/2021   GFR 56.66 (L) 07/15/2021     Plan: Continue all medicines at current  dosages. Work on reducing junk food.   2. Morbid Obesity: Current BMI 51 Melanie Reeves is currently in the action stage of change. As such, her goal is to continue with weight loss efforts.  She has agreed to practicing portion control and making smarter food choices, such as increasing vegetables and decreasing simple carbohydrates.  Keep snack calories < 400/day.  Work on having lunch rather than a snack of cheese sticks.  Exercise goals: All adults should avoid inactivity. Some physical activity is better than none, and adults who participate in any amount of physical activity gain some health benefits.  Behavioral modification strategies: increasing lean protein intake, no meal skipping, meal planning , decrease junk food, measure portion sizes, work on smaller portions, and mindful eating.  Melanie Reeves has agreed to follow-up with our clinic in 5 weeks.   No orders of the defined types were placed in this encounter.   There are no discontinued medications.   No orders of the defined types were placed in this encounter.     Objective:   VITALS: Per patient if applicable, see vitals. GENERAL:  Alert and in no acute distress. CARDIOPULMONARY: No increased WOB. Speaking in clear sentences.  PSYCH: Pleasant and cooperative. Speech normal rate and rhythm. Affect is appropriate. Insight and judgement are appropriate. Attention is focused, linear, and appropriate.  NEURO: Oriented as arrived to appointment on time with no prompting.   Attestation Statements:   Reviewed  by clinician on day of visit: allergies, medications, problem list, medical history, surgical history, family history, social history, and previous encounter notes.  Time spent on visit including the items listed below was 30 minutes.  -preparing to see the patient (e.g., review of tests, history, previous notes) -obtaining and/or reviewing separately obtained history -counseling and educating the patient/family/caregiver -documenting clinical information in the electronic or other health record -ordering medications, tests, or procedures -independently interpreting results and communicating results to the patient/ family/caregiver -referring and communicating with other health care professionals  -care coordination   This was prepared with the assistance of Engineer, civil (consulting).  Occasional wrong-word or sound-a-like substitutions may have occurred due to the inherent limitations of voice recognition software.

## 2023-02-02 ENCOUNTER — Encounter (INDEPENDENT_AMBULATORY_CARE_PROVIDER_SITE_OTHER): Payer: Self-pay | Admitting: Family Medicine

## 2023-02-02 ENCOUNTER — Telehealth (INDEPENDENT_AMBULATORY_CARE_PROVIDER_SITE_OTHER): Payer: PPO | Admitting: Family Medicine

## 2023-02-02 DIAGNOSIS — Z794 Long term (current) use of insulin: Secondary | ICD-10-CM

## 2023-02-02 DIAGNOSIS — Z7985 Long-term (current) use of injectable non-insulin antidiabetic drugs: Secondary | ICD-10-CM

## 2023-02-02 DIAGNOSIS — Z6841 Body Mass Index (BMI) 40.0 and over, adult: Secondary | ICD-10-CM

## 2023-02-02 DIAGNOSIS — E1169 Type 2 diabetes mellitus with other specified complication: Secondary | ICD-10-CM | POA: Diagnosis not present

## 2023-02-16 ENCOUNTER — Other Ambulatory Visit (INDEPENDENT_AMBULATORY_CARE_PROVIDER_SITE_OTHER): Payer: Self-pay | Admitting: Family Medicine

## 2023-02-16 DIAGNOSIS — Z794 Long term (current) use of insulin: Secondary | ICD-10-CM

## 2023-02-19 ENCOUNTER — Other Ambulatory Visit (INDEPENDENT_AMBULATORY_CARE_PROVIDER_SITE_OTHER): Payer: Self-pay | Admitting: Family Medicine

## 2023-02-19 DIAGNOSIS — E1169 Type 2 diabetes mellitus with other specified complication: Secondary | ICD-10-CM

## 2023-02-27 ENCOUNTER — Telehealth: Payer: Self-pay | Admitting: Internal Medicine

## 2023-02-27 DIAGNOSIS — Z0279 Encounter for issue of other medical certificate: Secondary | ICD-10-CM

## 2023-02-27 NOTE — Telephone Encounter (Signed)
Patient dropped DMV forms off to be filled out, by Dr. Tenny Craw 02-27-2023. Forms will be in Dr. Tenny Craw box.

## 2023-03-03 ENCOUNTER — Ambulatory Visit (INDEPENDENT_AMBULATORY_CARE_PROVIDER_SITE_OTHER): Payer: PPO | Admitting: Family Medicine

## 2023-03-03 ENCOUNTER — Encounter (INDEPENDENT_AMBULATORY_CARE_PROVIDER_SITE_OTHER): Payer: Self-pay | Admitting: Family Medicine

## 2023-03-03 VITALS — BP 138/84 | HR 82 | Temp 97.9°F | Ht 61.0 in | Wt 259.0 lb

## 2023-03-03 DIAGNOSIS — E669 Obesity, unspecified: Secondary | ICD-10-CM

## 2023-03-03 DIAGNOSIS — E1169 Type 2 diabetes mellitus with other specified complication: Secondary | ICD-10-CM

## 2023-03-03 DIAGNOSIS — Z794 Long term (current) use of insulin: Secondary | ICD-10-CM

## 2023-03-03 DIAGNOSIS — Z6841 Body Mass Index (BMI) 40.0 and over, adult: Secondary | ICD-10-CM

## 2023-03-03 DIAGNOSIS — Z7985 Long-term (current) use of injectable non-insulin antidiabetic drugs: Secondary | ICD-10-CM

## 2023-03-03 MED ORDER — TIRZEPATIDE 15 MG/0.5ML ~~LOC~~ SOAJ: 15.0000 mg | SUBCUTANEOUS | 0 refills | Status: DC

## 2023-03-03 NOTE — Progress Notes (Signed)
Chief Complaint:   OBESITY Melanie Reeves is here to discuss her progress with her obesity treatment plan along with follow-up of her obesity related diagnoses. Melanie Reeves is on practicing portion control and making smarter food choices, such as increasing vegetables and decreasing simple carbohydrates and states she is following her eating plan approximately 80% of the time. Melanie Reeves states she is doing 0 minutes 0 times per week.  Today's visit was #: 36 Starting weight: 294 lbs Starting date: 11/29/2020 Today's weight: 259 lbs Today's date: 03/03/2023 Total lbs lost to date: 35 Total lbs lost since last in-office visit: 12  Interim History: Melanie Reeves continues to do well with her weight loss.  She is working on Music therapist, and making healthier options.  She has not started exercising yet.  She is crocheting more to help herself stay occupied and help decrease emotional eating behavior.  Subjective:   1. Type 2 diabetes mellitus with other specified complication, with long-term current use of insulin (HCC) [place] is working on her diet and weight loss, and she is doing well on Mounjaro.  If she eats too many carbohydrates, she notes some nausea.  Assessment/Plan:   1. Type 2 diabetes mellitus with other specified complication, with long-term current use of insulin (HCC) Melanie Reeves will continue Mounjaro at 15 mg, and we will refill for 1 month.  She is to continue to follow-up with Endocrinology, and she is to continue to work on her diet and weight loss to improve her glucose control.  - tirzepatide (MOUNJARO) 15 MG/0.5ML Pen; Inject 15 mg into the skin once a week.  Dispense: 6 mL; Refill: 0  2. BMI 45.0-49.9, adult (HCC)  3. Obesity, Beginning BMI 55.55 Melanie Reeves is currently in the action stage of change. As such, her goal is to continue with weight loss efforts. She has agreed to practicing portion control and making smarter food choices, such as increasing vegetables and  decreasing simple carbohydrates.   Exercise goals: All adults should avoid inactivity. Some physical activity is better than none, and adults who participate in any amount of physical activity gain some health benefits.  Behavioral modification strategies: emotional eating strategies.  Melanie Reeves has agreed to follow-up with our clinic in 4 weeks. She was informed of the importance of frequent follow-up visits to maximize her success with intensive lifestyle modifications for her multiple health conditions.   Objective:   Blood pressure 138/84, pulse 82, temperature 97.9 F (36.6 C), height 5\' 1"  (1.549 m), weight 259 lb (117.5 kg), SpO2 92 %. Body mass index is 48.94 kg/m.  Lab Results  Component Value Date   CREATININE 0.88 11/18/2022   BUN 34 (H) 11/18/2022   NA 137 11/18/2022   K 5.3 (H) 11/18/2022   CL 103 11/18/2022   CO2 21 (L) 11/18/2022   Lab Results  Component Value Date   ALT 27 10/16/2021   AST 32 10/16/2021   ALKPHOS 69 10/16/2021   BILITOT <0.2 10/16/2021   Lab Results  Component Value Date   HGBA1C 7.2 (H) 03/11/2022   HGBA1C 6.9 (H) 11/26/2021   HGBA1C 6.8 (H) 10/16/2021   HGBA1C 6.5 07/15/2021   HGBA1C 6.5 03/19/2021   Lab Results  Component Value Date   INSULIN 34.8 (H) 10/16/2021   Lab Results  Component Value Date   TSH 1.217 11/24/2020   Lab Results  Component Value Date   CHOL 152 10/16/2021   HDL 46 10/16/2021   LDLCALC 66 10/16/2021  LDLDIRECT 72.0 02/28/2020   TRIG 247 (H) 10/16/2021   CHOLHDL 3 07/15/2021   Lab Results  Component Value Date   VD25OH 43.1 10/16/2021   VD25OH 36.2 06/05/2021   VD25OH 19.7 (L) 11/29/2020   Lab Results  Component Value Date   WBC 11.7 (H) 11/18/2022   HGB 13.6 11/18/2022   HCT 42.9 11/18/2022   MCV 85.1 11/18/2022   PLT 256 11/18/2022   Lab Results  Component Value Date   FERRITIN 264 10/25/2020   Attestation Statements:   Reviewed by clinician on day of visit: allergies, medications,  problem list, medical history, surgical history, family history, social history, and previous encounter notes.  Time spent on visit including pre-visit chart review and post-visit care and charting was 30 minutes.   I, Burt Knack, am acting as transcriptionist for Quillian Quince, MD.  I have reviewed the above documentation for accuracy and completeness, and I agree with the above. -  Quillian Quince, MD

## 2023-03-11 ENCOUNTER — Encounter: Payer: Self-pay | Admitting: Internal Medicine

## 2023-03-12 NOTE — Telephone Encounter (Signed)
Pt advised that Dr Tenny Craw has her forms and will be completed today.

## 2023-03-13 NOTE — Telephone Encounter (Signed)
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 Patient notified to come pick up form.

## 2023-03-13 NOTE — Telephone Encounter (Signed)
Forms returned to the front desk.

## 2023-03-18 ENCOUNTER — Other Ambulatory Visit: Payer: Self-pay | Admitting: Endocrinology

## 2023-03-18 ENCOUNTER — Other Ambulatory Visit: Payer: Self-pay | Admitting: Internal Medicine

## 2023-03-18 DIAGNOSIS — I480 Paroxysmal atrial fibrillation: Secondary | ICD-10-CM

## 2023-03-18 NOTE — Telephone Encounter (Signed)
Xarelto 20mg  refill request received. Pt is 72 years old, weight-117.5kg, Crea-0.88 on 11/18/22, last seen by Jari Favre on 12/24/22, Diagnosis-Afib, CrCl-131.67 mL/min; Dose is appropriate based on dosing criteria. Will send in refill to requested pharmacy.

## 2023-03-19 IMAGING — DX DG FOOT COMPLETE 3+V*L*
3 series · 3 of 3 positions shown · non-contrast
Comparison: None.

CLINICAL DATA: Left foot cellulitis, initial encounter

EXAM:
LEFT FOOT - COMPLETE 3+ VIEW

[foot ap]
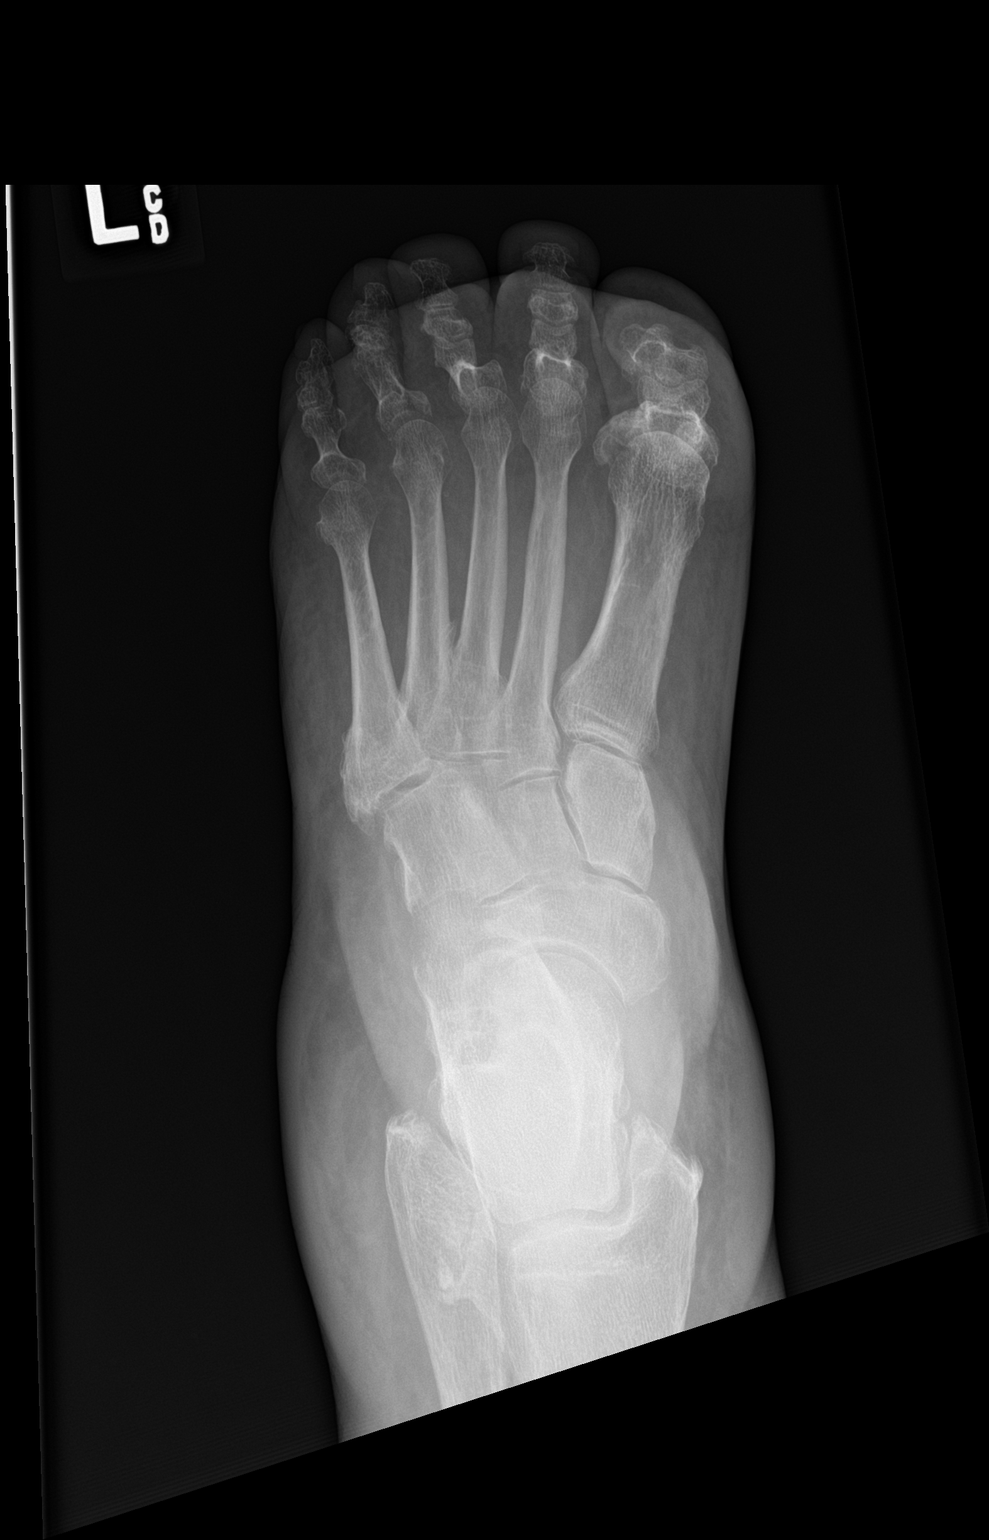

[foot obl]
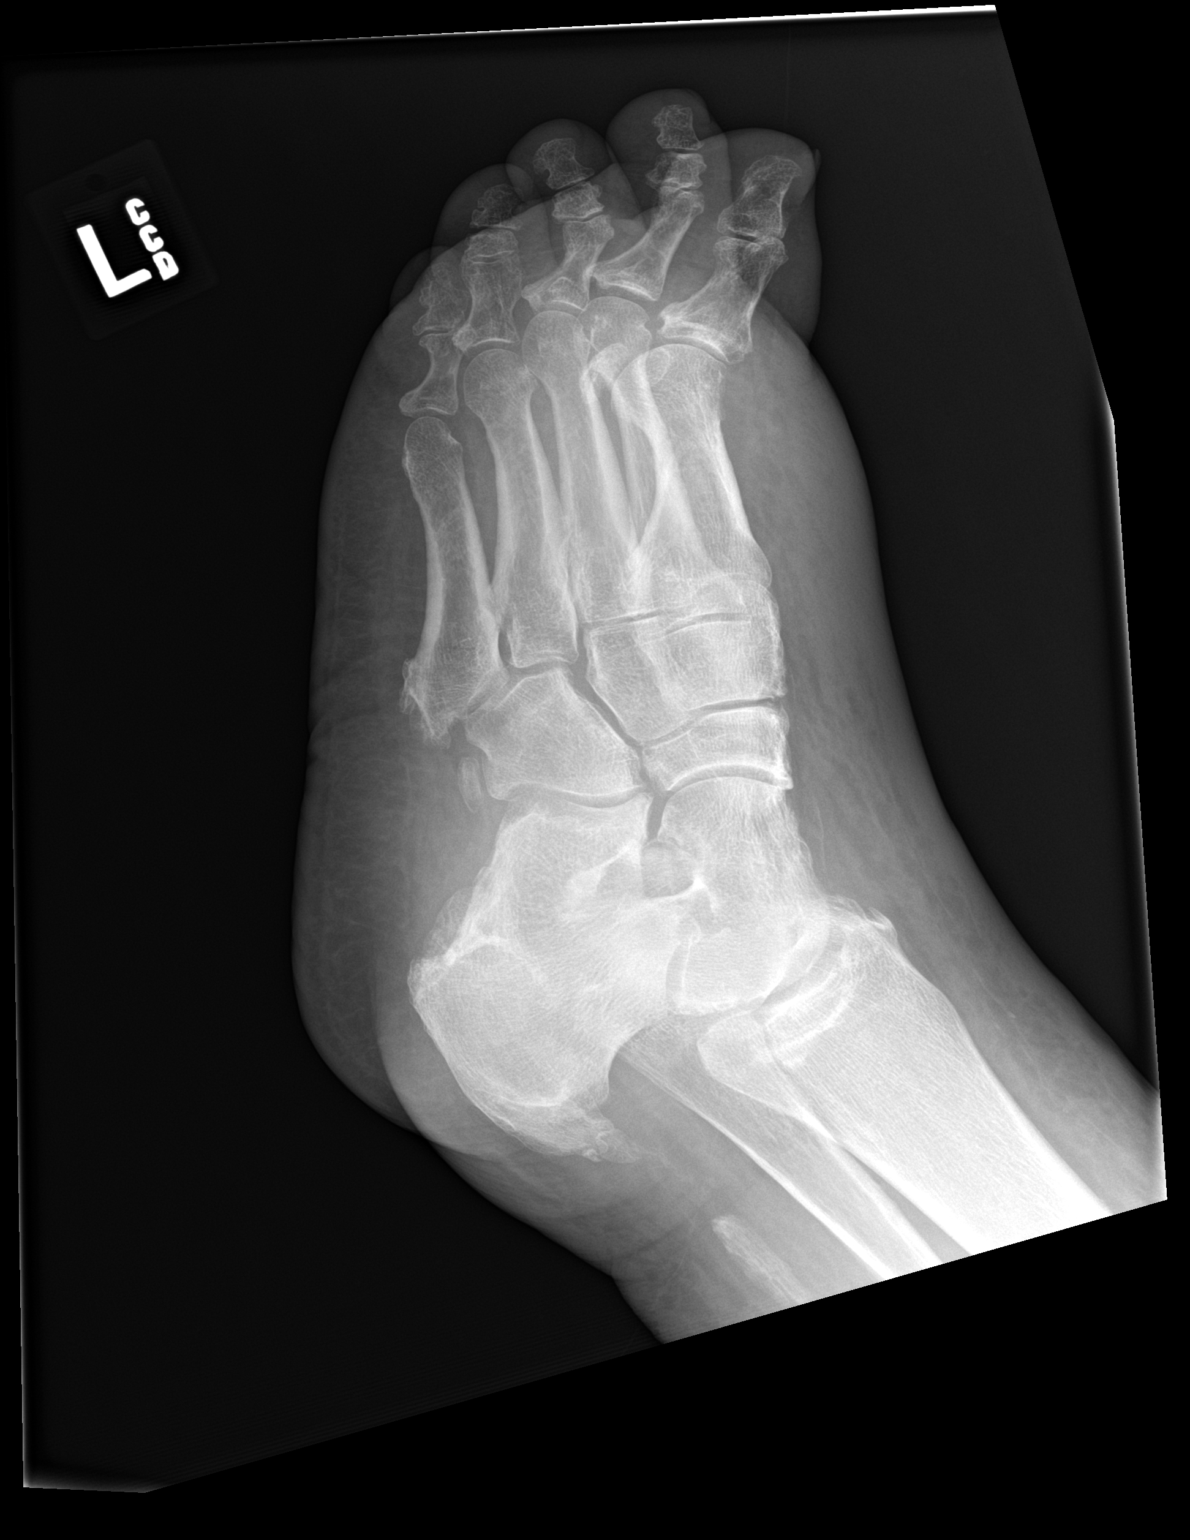

[foot lat]
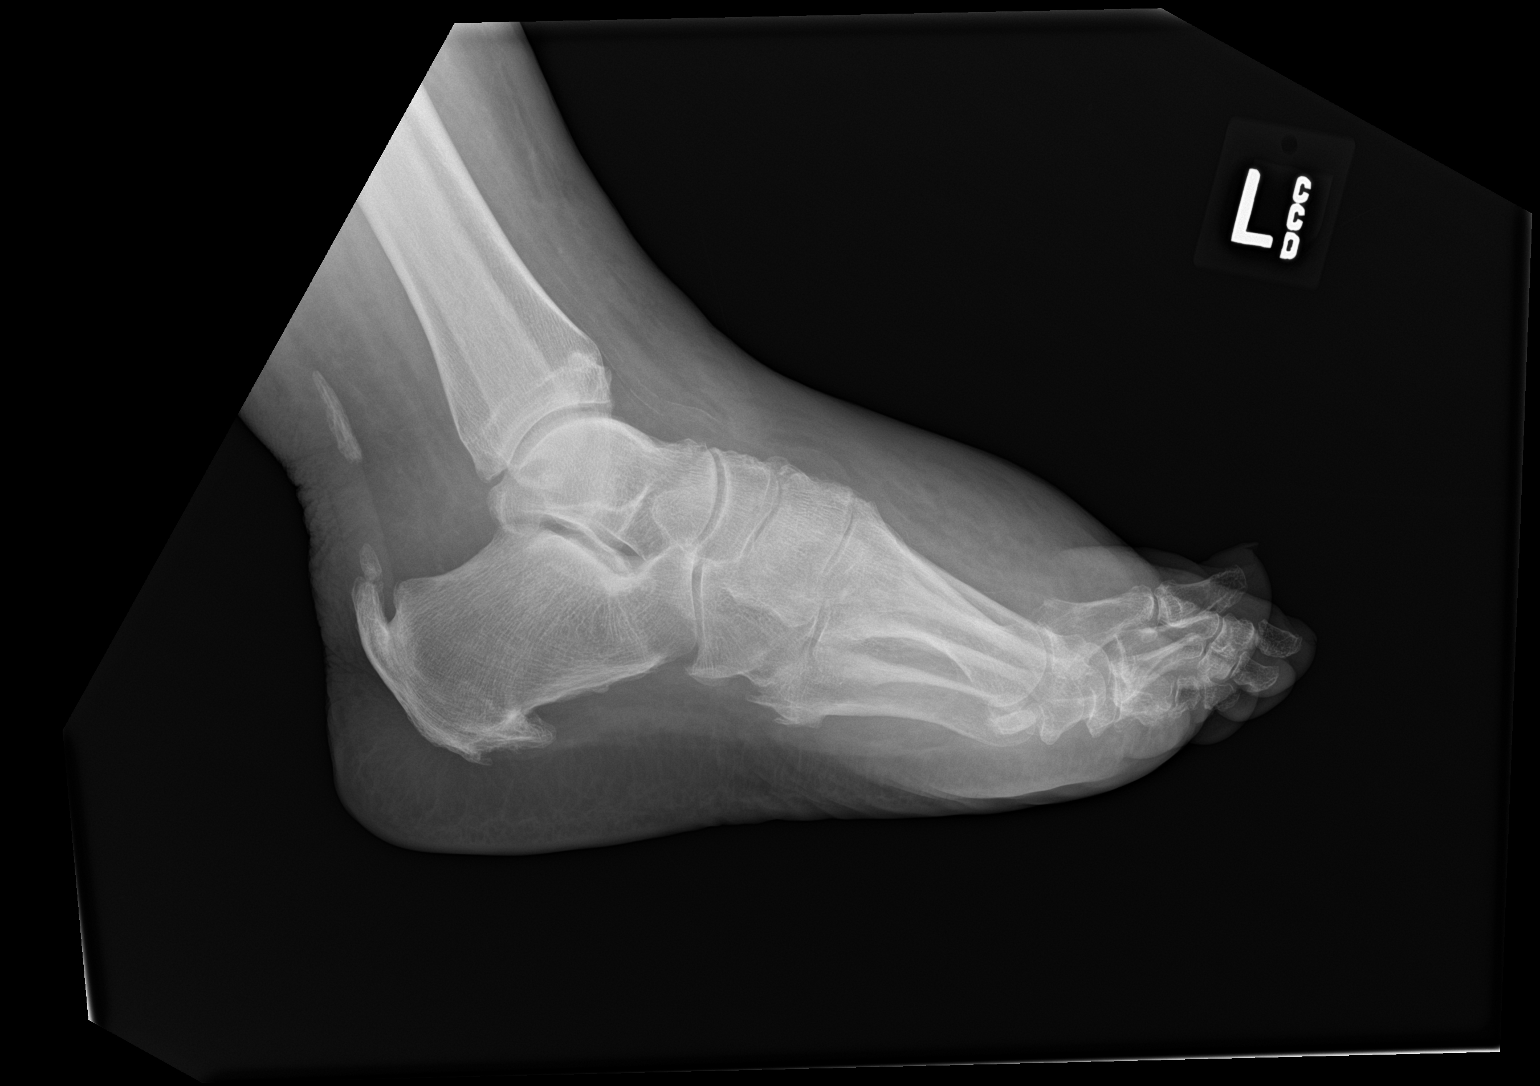

[3 of 3 positions shown; findings below may reference images not displayed]

FINDINGS: Considerable soft tissue swelling is noted over the dorsum of the
foot in the region the metatarsals consistent with the given
clinical history. Tarsal degenerative change and calcaneal spurring
is noted. No acute fracture or dislocation is seen. No radiopaque
foreign body is noted.
IMPRESSION: Changes consistent with the given clinical history of dorsal
cellulitis. No definitive findings to suggest osteomyelitis are
noted.

## 2023-03-21 IMAGING — US US EXTREM LOW*L* LIMITED
1 series · 14 of 23 positions shown · non-contrast
Comparison: Foot radiograph 03/27/2021

CLINICAL DATA: Left foot cellulitis.  Rule out abscess.

EXAM:
ULTRASOUND LEFT LOWER EXTREMITY LIMITED
TECHNIQUE: Ultrasound examination of the lower extremity soft tissues was
performed in the area of clinical concern.

[Series 1: us extrem low*left* limited · 23 acquisitions, 14 frames shown]
[im 1/23]
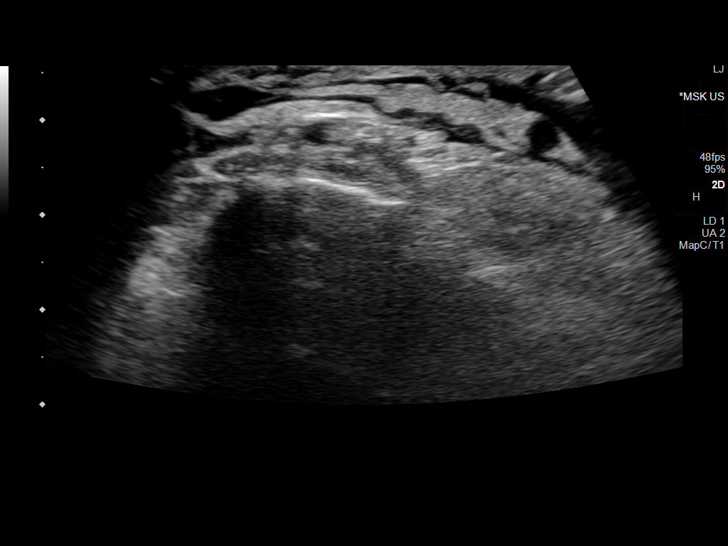
[im 3/23]
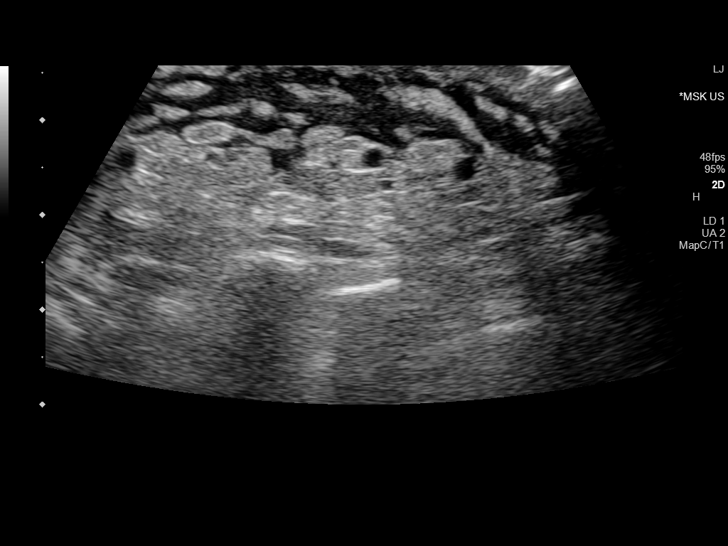
[im 5/23]
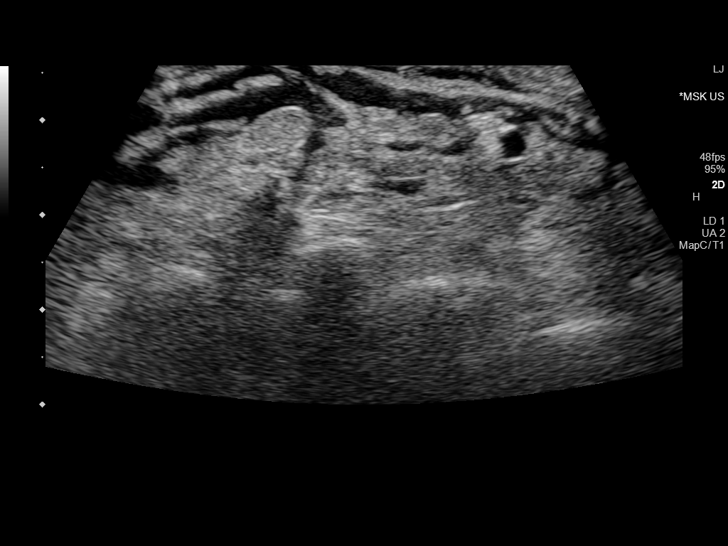
[im 6/23]
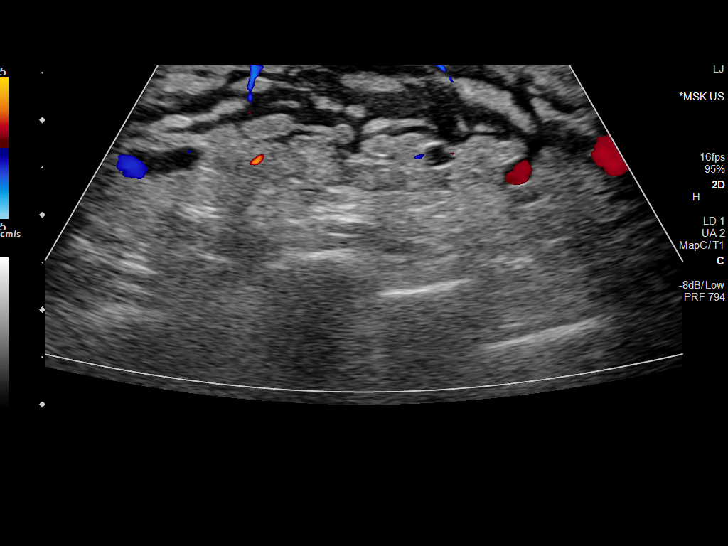
[im 8/23]
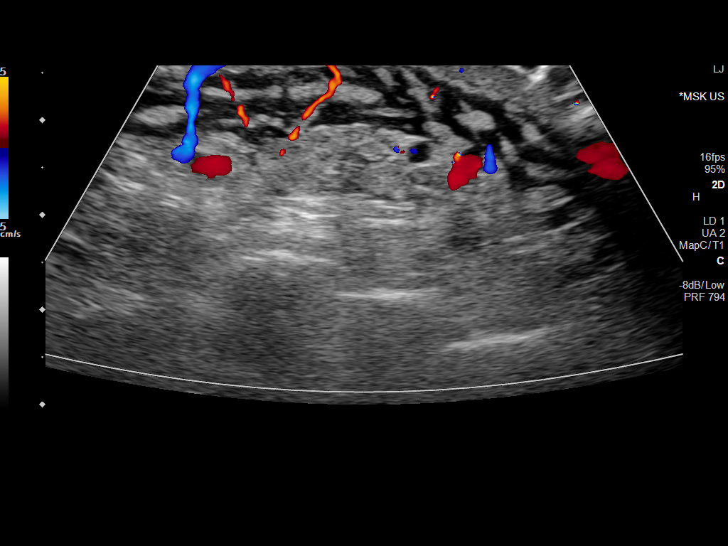
[im 10/23]
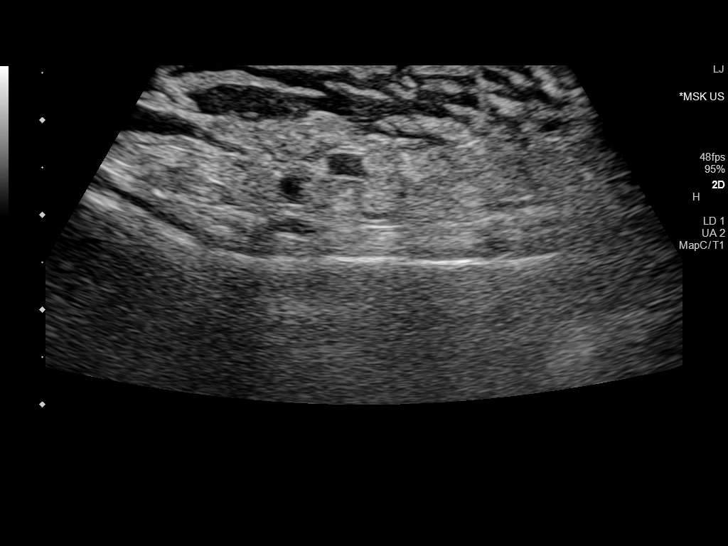
[im 11/23]
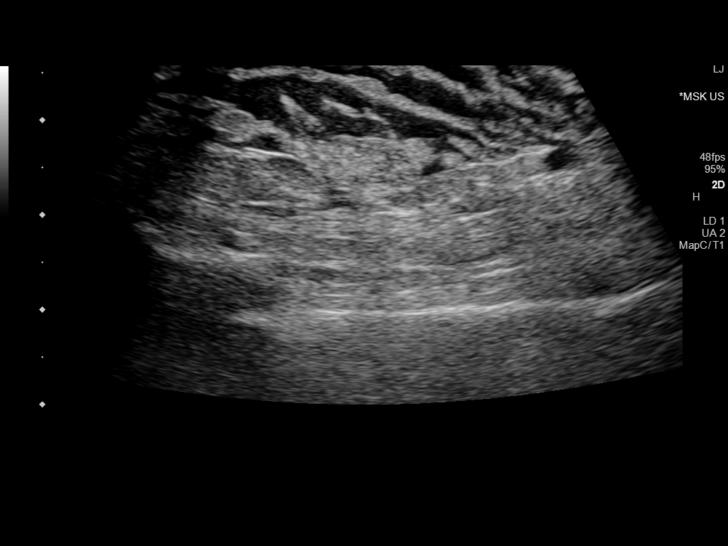
[im 13/23]
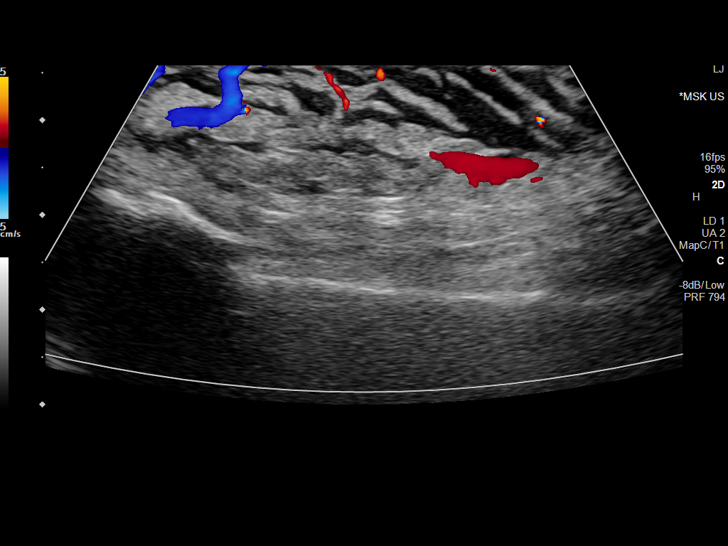
[im 14/23]
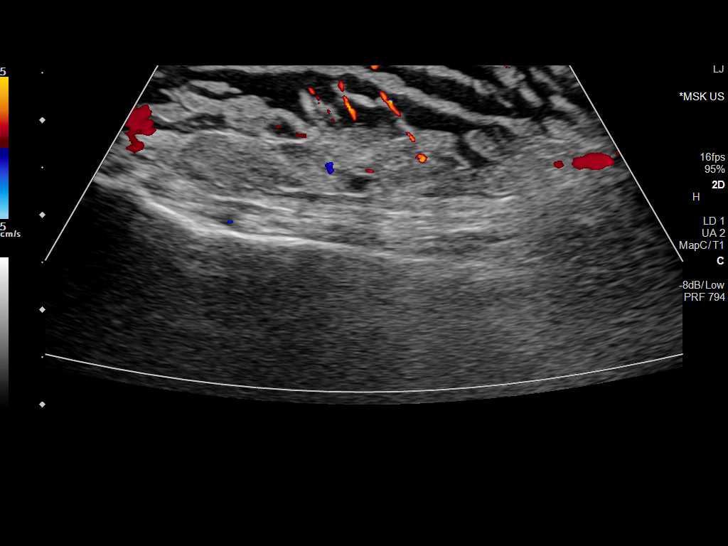
[im 16/23]
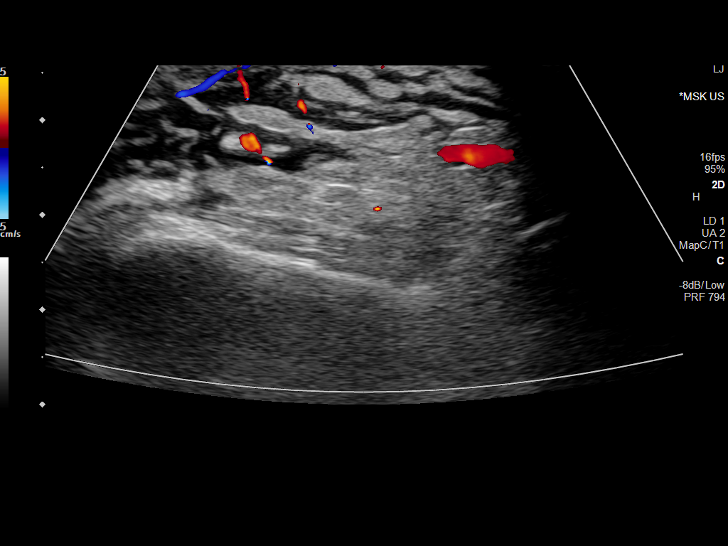
[im 18/23]
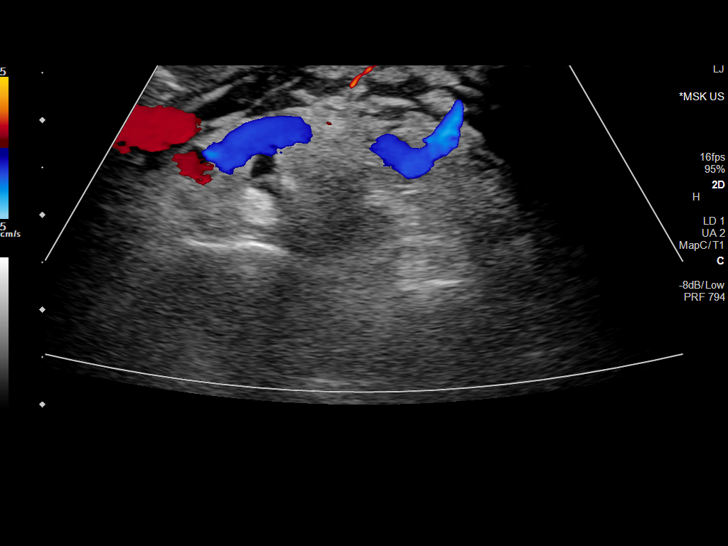
[im 19/23]
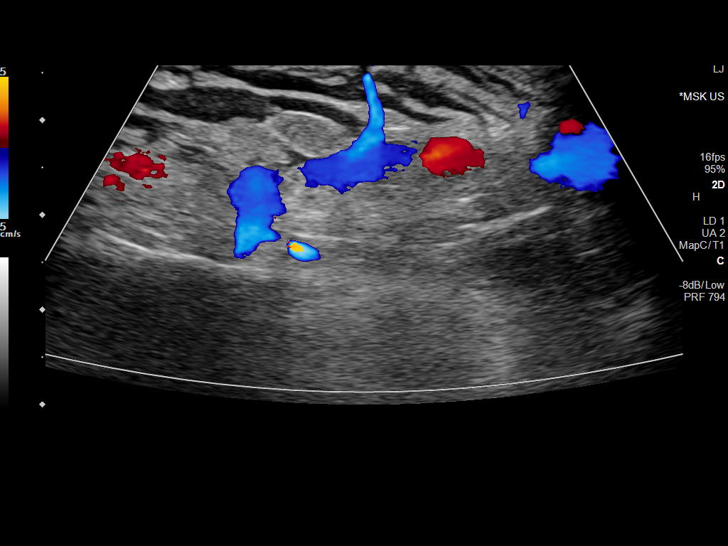
[im 21/23]
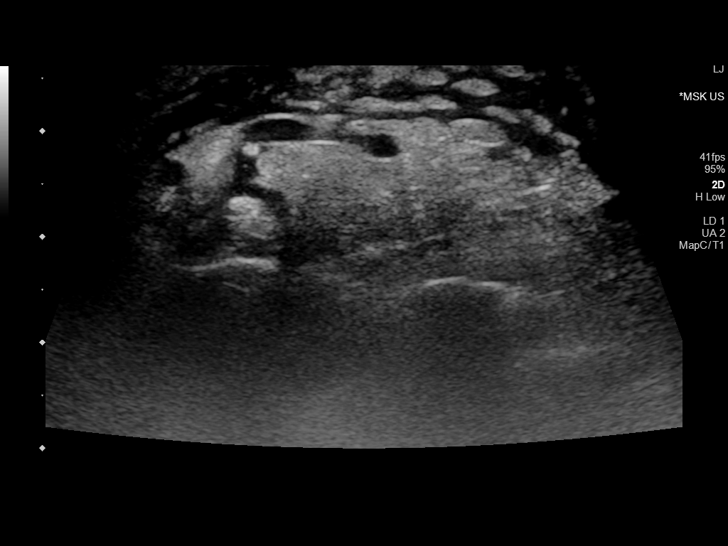
[im 23/23]
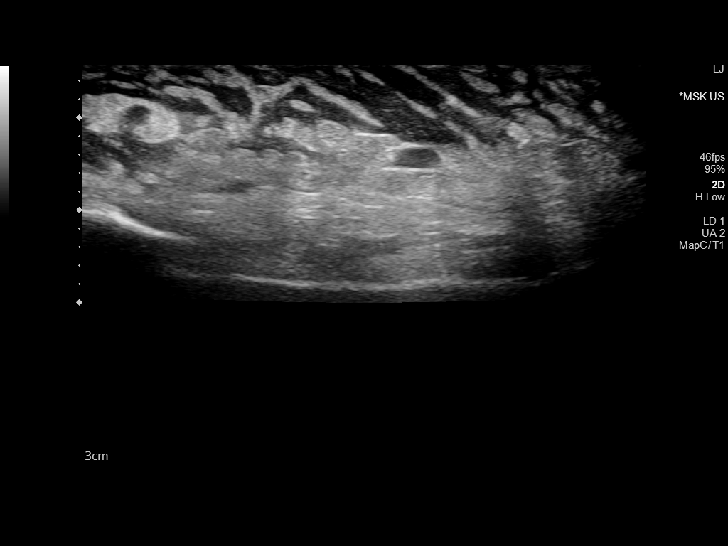

[14 of 23 positions shown; findings below may reference images not displayed]

FINDINGS: Targeted sonographic evaluation in the area of clinical concern
labeled left foot dorsal aspect. There is diffuse and prominent
subcutaneous edema but no focal fluid collection. Mild scattered
soft tissue vascularity without any peripherally enhancing foci. No
sonographic evidence of radiopaque foreign body.
IMPRESSION: Prominent subcutaneous edema without sonographic evidence of abscess
or focal fluid collection.

## 2023-03-24 NOTE — Progress Notes (Signed)
Cardiology Office Note:    Date:  03/30/2023   ID:  DEVIE MOSBY, DOB 08-14-1951, MRN [id number]  PCP:  Eartha Inch, MD   [facility] Providers Cardiologist:  Dietrich Pates, MD     Referring MD: Eartha Inch, MD   Chief Complaint: preoperative cardiac evaluation  History of Present Illness:    Melanie Reeves is a pleasant 72 y.o. female with a hx of anxiety, atrial fibrillation, chest pain, depression, diabetes, HTN, HLD, morbid obesity, edema, CKD, and LBBB.   Referred to cardiology in 2015 for evaluation of dyspnea and abnormal EKG.  Myoview in 2015 and 2018 were normal.  Echocardiogram February 2022 showed normal LVEF and mild diastolic dysfunction.  Paroxysmal atrial fibrillation was found on event monitor 10/2017.  She was started on relative for stroke prevention for CHA2DS2-VASc score of 3.  Seen by Dr. Tenny Craw 06/24/22 at which time she was having a productive cough. Her BNP was normal and no evidence of volume overload. She was advised to return for follow-up in 8 months.   Seen in clinic on 12/24/22 by Jari Favre, PA following recent ED visit for shortness of breath and sinus congestion for approximately 1 month. She reported recent 3 pound weight gain and thought it was fluid. Had been on Lasix for some time though no known history of CHF. Sleeps in a recliner at baseline due to history of frozen shoulder. She reported heart pounding and shortness of breath she thought secondary to palpitations. Was not taking Lasix at that time.  Intentional weight loss of 26 pounds over the previous 6 months with the help of [facility] weight and wellness.  Echo was updated on 01/26/2023 and revealed LVEF 55 to 60%, no RWMA, G1 DD, no significant valve disease. She was advised to resume Lasix 40 mg daily and return for follow-up in 3 months.  Today, she is here for preoperative cardiac evaluation. Is scheduled for colonoscopy on 04/24/23. Having bilateral shoulder pain, has a rotator  cuff tear and some bone spurs. Continues to sleep in a recliner for arm comfort. Sometimes feels poorly after eating too much sugar. Admits to not being very active. Has improved her diet and tries to limit sugar and simple carbohydrates. Admits to eating [place] food last night. Has chronic bilateral LE edema but feels it has been stable. She denies chest pain, shortness of breath, fatigue, palpitations, melena, hematuria, hemoptysis, diaphoresis, weakness, presyncope, syncope, orthopnea, and PND. Occasionally checks BP at home, usually if she feels that something is wrong. No specific cardiac symptoms.   Past Medical History:  Diagnosis Date   Abnormal electrocardiogram 08/14/2014   LBBB   Anemia    Anxiety    Arthritis    Atrial fibrillation (HCC)    Bilateral swelling of feet    Chest pain    Constipation    Depression    Diabetes mellitus (HCC)    Difficulty walking    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   Qualifier: Diagnosis of  By: Romeo Apple MD, Stanley     Dyspnea 08/14/2014   Edema    Gallbladder problem    GERD (gastroesophageal reflux disease)    Hyperlipidemia    Hypertension    LOW BACK PAIN 12/20/2009   Qualifier: Diagnosis of  By: Romeo Apple MD, Stanley     Morbid obesity (HCC)    OSA (obstructive sleep apnea) 08/16/2014   Osteoarthritis    Other fatigue    Palpitations    Seasonal allergies  Shortness of breath    Shortness of breath on exertion    Sinusitis    SPINAL STENOSIS 12/20/2009   Qualifier: Diagnosis of  By: Romeo Apple MD, Duffy Rhody     Stage 3 chronic kidney disease [facility])     Past Surgical History:  Procedure Laterality Date   CHOLECYSTECTOMY     COLONOSCOPY WITH PROPOFOL N/A 11/29/2014   Procedure: COLONOSCOPY WITH PROPOFOL;  Surgeon: Willis Modena, MD;  Location: [facility];  Service: Endoscopy;  Laterality: N/A;   SKIN CANCER EXCISION     TONSILLECTOMY      Current Medications: Current Meds  Medication Sig   amLODipine (NORVASC) 5 MG tablet TAKE 1  TABLET (5 MG TOTAL) BY MOUTH DAILY.   atenolol (TENORMIN) 25 MG tablet Take 1 tablet (25 mg total) by mouth daily.   B-D ULTRAFINE III SHORT PEN 31G X 8 MM MISC USE TO INJECT MEDICATION 5 TIMES DAILY.   Blood Glucose Monitoring Suppl (ONETOUCH VERIO) w/Device KIT UAD to monitor glucose tid; Dx: E11.65   Canagliflozin-metFORMIN HCl [phone] MG TABS Take 2 tablets by mouth daily with supper.   cetirizine (ZYRTEC) 10 MG tablet Take 10 mg by mouth daily.  Take one tablet (10 mg dose) by mouth every morning. QAM   Continuous Glucose Sensor (FREESTYLE LIBRE 3 SENSOR) MISC PLACE 1 SENSOR ON THE SKIN EVERY 14 DAYS. USE TO CHECK GLUCOSE CONTINUOUSLY   cyclobenzaprine (FLEXERIL) 5 MG tablet Take 5 mg by mouth 3 (three) times daily as needed for muscle spasms.   diclofenac Sodium (VOLTAREN) 1 % GEL ) Topical 4 Times Daily PRN   DULoxetine (CYMBALTA) 30 MG capsule TAKE 1 CAPSULE BY MOUTH DAILY. TAKE IN ADDITION TO THE 60 MG   DULoxetine (CYMBALTA) 60 MG capsule Take 60 mg by mouth daily. Taking in the AM   fenofibrate 160 MG tablet Take 1 tablet (160 mg total) by mouth daily.   furosemide (LASIX) 40 MG tablet Take 1 tablet (40 mg total) by mouth daily. May take an extra tablet by mouth daily as needed.   glucose blood (ONETOUCH ULTRA) test strip USE AS INSTRUCTED TO CHECK BLOOD SUGAR THREE TIMES DAILY.   ibuprofen (ADVIL) 200 MG tablet Take 400 mg by mouth every 6 (six) hours as needed for fever.   methocarbamol (ROBAXIN) 500 MG tablet Take by mouth.  Take one tablet (500 mg dose) by mouth 2 (two) times a day as needed (PRN MUSCLE SPASMS).   nystatin (MYCOSTATIN/NYSTOP) powder Apply topically 2 (two) times daily.  Apply topically 2 (two) times daily.   Omega-3 Fatty Acids (FISH OIL) 1000 MG CAPS Take 2,000 mg by mouth daily.   pravastatin (PRAVACHOL) 40 MG tablet TAKE 1 TABLET BY MOUTH EVERY DAY   pregabalin (LYRICA) 100 MG capsule Take 1 capsule (100 mg total) by mouth 2 (two) times daily.   tirzepatide  (MOUNJARO) 15 MG/0.5ML Pen Inject 15 mg into the skin once a week.   TOUJEO MAX SOLOSTAR 300 UNIT/ML Solostar Pen INJECT 140 UNITS INTO THE SKIN DAILY. . (Patient taking differently: 100 Units.)   traMADol (ULTRAM) 50 MG tablet Take by mouth.  Take one tablet (50 mg dose) by mouth every 8 (eight) hours as needed for Pain (PRN PAIN).   traZODone (DESYREL) 100 MG tablet Take 100 mg by mouth at bedtime.   Vitamin D, Ergocalciferol, (DRISDOL) 1.25 MG (50000 UNIT) CAPS capsule TAKE 1 CAPSULE BY MOUTH ONE TIME PER WEEK   XARELTO 20 MG TABS tablet TAKE 1 TABLET  BY MOUTH DAILY WITH SUPPER     Allergies:   Simvastatin and Sulfa antibiotics   Social History   Socioeconomic History   Marital status: Married    Spouse name: Greggory Stallion   Number of children: 1   Years of education: Not on file   Highest education level: Not on file  Occupational History   Occupation: Retired    Comment: Therapist, sports  Tobacco Use   Smoking status: Former    Packs/day: 2.00    Years: 20.00    Additional pack years: 0.00    Total pack years: 40.00    Types: Cigarettes    Quit date: 11/22/1993    Years since quitting: 29.3   Smokeless tobacco: Former  Building services engineer Use: Never used  Substance and Sexual Activity   Alcohol use: Yes    Comment: Rare   Drug use: No   Sexual activity: Not Currently    Partners: Male  Other Topics Concern   Not on file  Social History Narrative   Not on file   Social Determinants of Health   Financial Resource Strain: Not on file  Food Insecurity: Not on file  Transportation Needs: Not on file  Physical Activity: Not on file  Stress: Not on file  Social Connections: Not on file     Family History: The patient's family history includes Anxiety disorder in her father and mother; Bipolar disorder in her mother; Cancer in her father; Cancer - Lung in her father; Depression in her father and mother; Diabetes in her father; Eating disorder in her mother; Heart disease  in her father; Hyperlipidemia in her father and mother; Hypertension in her father and mother; Kidney disease in her mother; Obesity in her mother; Stroke in her mother.  ROS:   Please see the history of present illness.    + chronic LE edema All other systems reviewed and are negative.  Labs/Other Studies Reviewed:    The following studies were reviewed today:  Echo 01/26/2023 1. Left ventricular ejection fraction, by estimation, is 55 to 60%. Left  ventricular ejection fraction by 3D volume is 58 %. The left ventricle has  normal function. The left ventricle has no regional wall motion  abnormalities. Left ventricular diastolic   parameters are consistent with Grade I diastolic dysfunction (impaired  relaxation).   2. Right ventricular systolic function is normal. The right ventricular  size is moderately enlarged. There is normal pulmonary artery systolic  pressure. The estimated right ventricular systolic pressure is 35.7 mmHg.   3. The mitral valve is normal in structure. Trivial mitral valve  regurgitation. No evidence of mitral stenosis.   4. The aortic valve is normal in structure. Aortic valve regurgitation is  not visualized. No aortic stenosis is present.   5. The inferior vena cava is normal in size with greater than 50%  respiratory variability, suggesting right atrial pressure of 3 mmHg.   Comparison(s): No significant change from prior study. Prior images  reviewed side by side.  Lexiscan Myoview 10/01/2017 Nuclear stress EF: 45%. There was no ST segment deviation noted during stress. The left ventricular ejection fraction is mildly decreased (45-54%). This is an intermediate risk study due to reduced systolic function. No ischemia noted. Abnormal resting perfusion images noted in the septum, consistent with LBBB.   Cardiac Monitor 10/13/2017 Sinus rhythm with intermittent atrial fibrillation Some of palpitations correlated with sinus rhythm  Others with atrial  fibrillation   Recent Labs: 06/24/2022: NT-Pro BNP  86 11/18/2022: B Natriuretic Peptide 28.9; BUN 34; Creatinine, Ser 0.88; Hemoglobin 13.6; Magnesium 2.0; Platelets 256; Potassium 5.3; Sodium 137  Recent Lipid Panel    Component Value Date/Time   CHOL 152 10/16/2021 1247   TRIG 247 (H) 10/16/2021 1247   HDL 46 10/16/2021 1247   CHOLHDL 3 07/15/2021 1054   VLDL 30.6 07/15/2021 1054   LDLCALC 66 10/16/2021 1247   LDLDIRECT 72.0 02/28/2020 1124     Risk Assessment/Calculations:    CHA2DS2-VASc Score = 5   This indicates a 7.2% annual risk of stroke. The patient's score is based upon: CHF History: 1 HTN History: 1 Diabetes History: 1 Stroke History: 0 Vascular Disease History: 0 Age Score: 1 Gender Score: 1          Physical Exam:    VS:  BP 130/70   Pulse 88   Ht 5\' 1"  (1.549 m)   Wt 268 lb 6.4 oz (121.7 kg)   SpO2 95%   BMI 50.71 kg/m     Wt Readings from Last 3 Encounters:  03/30/23 268 lb 6.4 oz (121.7 kg)  03/03/23 259 lb (117.5 kg)  01/06/23 271 lb (122.9 kg)     GEN:  Obese, well developed in no acute distress HEENT: Normal NECK: No JVD; No carotid bruits CARDIAC: RRR, no murmurs, rubs, gallops RESPIRATORY:  Clear to auscultation without rales, wheezing or rhonchi  ABDOMEN: Soft, non-tender, non-distended MUSCULOSKELETAL:  Bilateral nonpitting edema LE; No deformity. 2+ pedal pulses, equal bilaterally SKIN: Warm and dry NEUROLOGIC:  Alert and oriented x 3 PSYCHIATRIC:  Normal affect   EKG:  EKG is ordered today.  The ekg ordered today demonstrates sinus rhythm at 83 bpm, LBBB, no acute ST abnormality, nonspecific ST abnormality      Diagnoses:    1. Preoperative cardiovascular examination   2. Lower extremity edema   3. Essential hypertension   4. Chronic diastolic CHF (congestive heart failure) (HCC)   5. PAF (paroxysmal atrial fibrillation) (HCC)   6. LBBB (left bundle branch block)   7. Hyperlipidemia LDL goal <70    Assessment and Plan:      Preoperative cardiac evaluation: According to the Revised Cardiac Risk Index (RCRI), her Perioperative Risk of Major Cardiac Event is (%): 6.6. Her Functional Capacity in METs is: 4.4 according to the Duke Activity Status Index (DASI). The patient is doing well from a cardiac perspective. Therefore, based on ACC/AHA guidelines, the patient would be at acceptable risk for the planned procedure without further cardiovascular testing. Per office protocol, she can hold Xarelto for 2 days prior to procedure.   PAF: She is maintaining sinus rhythm on EKG today. She is asymptomatic. Continue atenolol for rate control. Continue Xarelto for stroke prevention for CHA2DS2-VASc score of 5 which is appropriate dose.   Hypertension: BP is well controlled.   Chronic HFpEF: LVEF 55 to 60%, G1 DD, moderately enlarged RV on echo 01/26/23. Has chronic LE edema that she feels is stable. Admits she is not very active but is not having any shortness of breath, dyspnea on exertion, orthopnea or PND. Weight has been stable on home scale. Body habitus makes it difficult to assess volume overload but no obvious evidence.  Encouraged low-sodium diet and regular walking for exercise. Continue furosemide.   LBBB: Known. HR is well controlled.  Normal LVEF on echo 01/26/23. She is asymptomatic.   Hyperlipidemia LDL goal < 70: LDL 51, trigs 227 on 10/01/22. Is due for repeat lab testing with PCP. Encouraged  her to continue to limit saturated fat, sugar, and processed foods. Continue pravastatin.   Diabetes: A1C 6.5 on 01/22/23. She reports blood sugar has been well controlled. Continue canagliflozin, metformin, Insulin, and mounjaro. Continue to work on healthy diet and weight loss.    Disposition: 6 months with Dr. Tenny Craw or APP  Medication Adjustments/Labs and Tests Ordered: Current medicines are reviewed at length with the patient today.  Concerns regarding medicines are outlined above.  Orders Placed This Encounter   Procedures   EKG 12-Lead   No orders of the defined types were placed in this encounter.   Patient Instructions  Medication Instructions:   Your physician recommends that you continue on your current medications as directed. Please refer to the Current Medication list given to you today.   *If you need a refill on your cardiac medications before your next appointment, please call your pharmacy*   Lab Work:  None ordered.   If you have labs (blood work) drawn today and your tests are completely normal, you will receive your results only by: MyChart Message (if you have MyChart) OR A paper copy in the mail If you have any lab test that is abnormal or we need to change your treatment, we will call you to review the results.   Testing/Procedures:  None ordered.   Follow-Up: At [facility], you and your health needs are our priority.  As part of our continuing mission to provide you with exceptional heart care, we have created designated Provider Care Teams.  These Care Teams include your primary Cardiologist (physician) and Advanced Practice Providers (APPs -  Physician Assistants and Nurse Practitioners) who all work together to provide you with the care you need, when you need it.  We recommend signing up for the patient portal called "MyChart".  Sign up information is provided on this After Visit Summary.  MyChart is used to connect with patients for Virtual Visits (Telemedicine).  Patients are able to view lab/test results, encounter notes, upcoming appointments, etc.  Non-urgent messages can be sent to your provider as well.   To learn more about what you can do with MyChart, go to [email or web address].    Your next appointment:   6 month(s)  Provider:   Dietrich Pates, MD        Signed, Levi Aland, NP  03/30/2023 1:01 PM    [facility]

## 2023-03-25 ENCOUNTER — Telehealth: Payer: Self-pay | Admitting: *Deleted

## 2023-03-25 NOTE — Telephone Encounter (Signed)
Pt scheduled to see Eligha Bridegroom, NP, 03/30/23, clearance will be addressed at that time.  Will route to the requesting surgeon's office to make them aware.

## 2023-03-25 NOTE — Telephone Encounter (Signed)
Patient has upcoming appointment with me on 03/30/23 at which time clearance will be addressed.   Levi Aland, NP-C  03/25/2023, 2:21 PM [address] Office [phone] Fax [phone]

## 2023-03-25 NOTE — Telephone Encounter (Signed)
   Pre-operative Risk Assessment    Patient Name: Melanie Reeves  DOB: 30-Jul-1951 MRN: [id number]      Request for Surgical Clearance    Procedure:   ENDOSCOPY OR COLONOSCOPY (left message for surgeon's office to call back and confirm)  Date of Surgery:  Clearance 04/24/23                                 Surgeon:  DR. Caryl Never Surgeon's Group or Practice Name:  [facility] Phone number:  [phone] Fax number:  [phone]   Type of Clearance Requested:   - Medical  - Pharmacy:  Hold Rivaroxaban (Xarelto) X'S 2 DAYS   Type of Anesthesia:  Not Indicated   Additional requests/questions:    Wilhemina Cash   03/25/2023, 1:27 PM

## 2023-03-25 NOTE — Telephone Encounter (Signed)
Patient with diagnosis of PAF(paroxysmal atrial fibrillation)  on Xarelto for anticoagulation.    Procedure:  ENDOSCOPY OR COLONOSCOPY Date of procedure: 04/24/2023   CHA2DS2-VASc Score = 5   This indicates a 7.2% annual risk of stroke. The patient's score is based upon: CHF History: 1 HTN History: 1 Diabetes History: 1 Stroke History: 0 Vascular Disease History: 0 Age Score: 1 Gender Score: 1     CrCl 88 mL/min (11/18/2022) Platelet count 256 K (11/18/2022)   Per office protocol, patient can hold Xarelto  for 2 days  days prior to procedure.     **This guidance is not considered finalized until pre-operative APP has relayed final recommendations.**

## 2023-03-26 ENCOUNTER — Encounter (INDEPENDENT_AMBULATORY_CARE_PROVIDER_SITE_OTHER): Payer: Self-pay | Admitting: Family Medicine

## 2023-03-30 ENCOUNTER — Encounter: Payer: Self-pay | Admitting: Nurse Practitioner

## 2023-03-30 ENCOUNTER — Ambulatory Visit: Payer: PPO | Attending: Nurse Practitioner | Admitting: Nurse Practitioner

## 2023-03-30 ENCOUNTER — Other Ambulatory Visit: Payer: Self-pay

## 2023-03-30 VITALS — BP 130/70 | HR 88 | Ht 61.0 in | Wt 268.4 lb

## 2023-03-30 DIAGNOSIS — E785 Hyperlipidemia, unspecified: Secondary | ICD-10-CM

## 2023-03-30 DIAGNOSIS — R600 Localized edema: Secondary | ICD-10-CM

## 2023-03-30 DIAGNOSIS — I447 Left bundle-branch block, unspecified: Secondary | ICD-10-CM

## 2023-03-30 DIAGNOSIS — I10 Essential (primary) hypertension: Secondary | ICD-10-CM

## 2023-03-30 DIAGNOSIS — Z01810 Encounter for preprocedural cardiovascular examination: Secondary | ICD-10-CM | POA: Diagnosis not present

## 2023-03-30 DIAGNOSIS — I480 Paroxysmal atrial fibrillation: Secondary | ICD-10-CM

## 2023-03-30 DIAGNOSIS — I5032 Chronic diastolic (congestive) heart failure: Secondary | ICD-10-CM

## 2023-03-30 NOTE — Patient Instructions (Signed)
Medication Instructions:   Your physician recommends that you continue on your current medications as directed. Please refer to the Current Medication list given to you today.   *If you need a refill on your cardiac medications before your next appointment, please call your pharmacy*   Lab Work:  None ordered.  If you have labs (blood work) drawn today and your tests are completely normal, you will receive your results only by: MyChart Message (if you have MyChart) OR A paper copy in the mail If you have any lab test that is abnormal or we need to change your treatment, we will call you to review the results.   Testing/Procedures:  None ordered.   Follow-Up: At [facility], you and your health needs are our priority.  As part of our continuing mission to provide you with exceptional heart care, we have created designated Provider Care Teams.  These Care Teams include your primary Cardiologist (physician) and Advanced Practice Providers (APPs -  Physician Assistants and Nurse Practitioners) who all work together to provide you with the care you need, when you need it.  We recommend signing up for the patient portal called "MyChart".  Sign up information is provided on this After Visit Summary.  MyChart is used to connect with patients for Virtual Visits (Telemedicine).  Patients are able to view lab/test results, encounter notes, upcoming appointments, etc.  Non-urgent messages can be sent to your provider as well.   To learn more about what you can do with MyChart, go to [email or web address].    Your next appointment:   6 month(s)  Provider:   Paula Ross, MD      

## 2023-03-31 ENCOUNTER — Ambulatory Visit (INDEPENDENT_AMBULATORY_CARE_PROVIDER_SITE_OTHER): Payer: PPO | Admitting: Family Medicine

## 2023-03-31 NOTE — Telephone Encounter (Signed)
Our office received a duplicate request today stating 2nd attempt. Pt had an appt yesterday in our office with Eligha Bridegroom, NP for pre op clearance. Pt's procedure is not until 04/24/23. At [facility] we strive to take care of our pt's to our very abilities. Our pt's are number one to our practice. Ov notes from Eligha Bridegroom, NP were faxed to your office yesterday after the appt with NP.  I will re-fax notes today as well.

## 2023-04-06 ENCOUNTER — Telehealth (INDEPENDENT_AMBULATORY_CARE_PROVIDER_SITE_OTHER): Payer: PPO | Admitting: Family Medicine

## 2023-04-06 ENCOUNTER — Encounter (INDEPENDENT_AMBULATORY_CARE_PROVIDER_SITE_OTHER): Payer: Self-pay | Admitting: Family Medicine

## 2023-04-06 DIAGNOSIS — E1165 Type 2 diabetes mellitus with hyperglycemia: Secondary | ICD-10-CM | POA: Diagnosis not present

## 2023-04-06 DIAGNOSIS — Z6841 Body Mass Index (BMI) 40.0 and over, adult: Secondary | ICD-10-CM

## 2023-04-06 DIAGNOSIS — Z7984 Long term (current) use of oral hypoglycemic drugs: Secondary | ICD-10-CM

## 2023-04-06 DIAGNOSIS — Z7985 Long-term (current) use of injectable non-insulin antidiabetic drugs: Secondary | ICD-10-CM

## 2023-04-06 DIAGNOSIS — E559 Vitamin D deficiency, unspecified: Secondary | ICD-10-CM

## 2023-04-06 DIAGNOSIS — Z794 Long term (current) use of insulin: Secondary | ICD-10-CM

## 2023-04-06 MED ORDER — VITAMIN D (ERGOCALCIFEROL) 1.25 MG (50000 UNIT) PO CAPS: 50000.0000 [IU] | ORAL_CAPSULE | ORAL | 0 refills | Status: DC

## 2023-04-06 NOTE — Progress Notes (Signed)
TeleHealth Visit:  This visit was completed with telemedicine (audio/video) technology. Melanie Reeves has verbally consented to this TeleHealth visit. The patient is located at home, the provider is located at home. The participants in this visit include the listed provider and patient. The visit was conducted today via MyChart video.  OBESITY Melanie Reeves is here to discuss her progress with her obesity treatment plan along with follow-up of her obesity related diagnoses.   Today's visit was # 37 Starting weight: 294 lbs Starting date: 11/29/2020 Weight at last in office visit: 259 lbs on 03/03/23 Total weight loss: 35 lbs at last in office visit on 03/03/23. Today's reported weight (04/06/23): none reported  Nutrition Plan: practicing portion control and making smarter food choices, such as increasing vegetables and decreasing simple carbohydrates   Current exercise: none  Interim History:  She is doing well with weight loss.  She had lost 12 pounds at her last in office visit. She has two meals and a snack per day. She snacks on cheese sticks and peanuts.  Feels she is getting plenty of protein. She is drinking Crystal Light tea. Has diet soda occasionally.  Appetite and cravings well controlled.  She crochets which keeps her head and hands occupied and reduces her snacking. She says that most of her eating is not due to hunger.  She now has her driver's license and we discussed starting water aerobics. Assessment/Plan:  1. Type 2 Diabetes Mellitus with hyperglycemia, without long-term current use of insulin HgbA1c is at goal. Last A1c was 6.5 on April 11.. Managed by Dr. Shawnee Knapp,. Episodes of hypoglycemia: rare CBG: high 300 (rare). Mostly< 250. Has CGM. Medication(s): canagliflozin-metformin [phone] mg 2 tabs daily with supper, Mounjaro 15 mg weekly,  Toujeo  80 units daily.  Lab Results  Component Value Date   HGBA1C 7.2 (H) 03/11/2022   HGBA1C 6.9 (H) 11/26/2021   HGBA1C 6.8 (H)  10/16/2021   Lab Results  Component Value Date   MICROALBUR <0.7 07/15/2021   LDLCALC 66 10/16/2021   CREATININE 0.88 11/18/2022   Lab Results  Component Value Date   GFR 58.46 (L) 03/11/2022   GFR 60.03 11/26/2021   GFR 56.66 (L) 07/15/2021    Plan: 1.  Continue all medications at current dosages. 2.  Discussed low-carb snack options. 3.  Follow-up with Dr. Shawnee Knapp 07/28/2023 as scheduled.  2. Vitamin D Deficiency Vitamin D is not at goal of 50.  Most recent vitamin D level was 43.1. She is on  prescription ergocalciferol 50,000 IU weekly. Lab Results  Component Value Date   VD25OH 43.1 10/16/2021   VD25OH 36.2 06/05/2021   VD25OH 19.7 (L) 11/29/2020    Plan: Continue and refill  prescription ergocalciferol 50,000 IU weekly  3.  Morbid obesity/BMI 48  Melanie Reeves is currently in the action stage of change. As such, her goal is to continue with weight loss efforts.  She has agreed to practicing portion control and making smarter food choices, such as increasing vegetables and decreasing simple carbohydrates.  1.  Limit peanuts to 1/4 cup serving per day.  Exercise goals: Encouraged her to start water aerobics twice weekly.  She has Silver sneakers.  Behavioral modification strategies: increasing lean protein intake, decreasing simple carbohydrates , and measure portion sizes.  Melanie Reeves has agreed to follow-up with our clinic in 4 weeks.  No orders of the defined types were placed in this encounter.   Medications Discontinued During This Encounter  Medication Reason   Vitamin D, Ergocalciferol, (DRISDOL) 1.25 MG (  50000 UNIT) CAPS capsule Reorder     Meds ordered this encounter  Medications   Vitamin D, Ergocalciferol, (DRISDOL) 1.25 MG (50000 UNIT) CAPS capsule    Sig: Take 1 capsule (50,000 Units total) by mouth every 7 (seven) days.    Dispense:  12 capsule    Refill:  0    Order Specific Question:   Supervising Provider    Answer:   Glennis Brink [[id number]]       Objective:   VITALS: Per patient if applicable, see vitals. GENERAL: Alert and in no acute distress. CARDIOPULMONARY: No increased WOB. Speaking in clear sentences.  PSYCH: Pleasant and cooperative. Speech normal rate and rhythm. Affect is appropriate. Insight and judgement are appropriate. Attention is focused, linear, and appropriate.  NEURO: Oriented as arrived to appointment on time with no prompting.   Attestation Statements:   Reviewed by clinician on day of visit: allergies, medications, problem list, medical history, surgical history, family history, social history, and previous encounter notes.   This was prepared with the assistance of Engineer, civil (consulting).  Occasional wrong-word or sound-a-like substitutions may have occurred due to the inherent limitations of voice recognition software.

## 2023-04-23 ENCOUNTER — Other Ambulatory Visit: Payer: Self-pay | Admitting: Endocrinology

## 2023-04-24 ENCOUNTER — Other Ambulatory Visit: Payer: Self-pay | Admitting: Endocrinology

## 2023-04-28 ENCOUNTER — Encounter (INDEPENDENT_AMBULATORY_CARE_PROVIDER_SITE_OTHER): Payer: Self-pay | Admitting: Family Medicine

## 2023-04-28 ENCOUNTER — Telehealth (INDEPENDENT_AMBULATORY_CARE_PROVIDER_SITE_OTHER): Payer: PPO | Admitting: Family Medicine

## 2023-04-28 DIAGNOSIS — I5032 Chronic diastolic (congestive) heart failure: Secondary | ICD-10-CM | POA: Diagnosis not present

## 2023-04-28 DIAGNOSIS — Z6841 Body Mass Index (BMI) 40.0 and over, adult: Secondary | ICD-10-CM

## 2023-04-28 DIAGNOSIS — E1165 Type 2 diabetes mellitus with hyperglycemia: Secondary | ICD-10-CM

## 2023-04-28 DIAGNOSIS — Z7985 Long-term (current) use of injectable non-insulin antidiabetic drugs: Secondary | ICD-10-CM

## 2023-04-28 DIAGNOSIS — Z7984 Long term (current) use of oral hypoglycemic drugs: Secondary | ICD-10-CM

## 2023-04-28 DIAGNOSIS — Z794 Long term (current) use of insulin: Secondary | ICD-10-CM

## 2023-04-28 MED ORDER — TIRZEPATIDE 15 MG/0.5ML ~~LOC~~ SOAJ: 15.0000 mg | SUBCUTANEOUS | 0 refills | Status: DC

## 2023-04-28 NOTE — Progress Notes (Signed)
TeleHealth Visit:  This visit was completed with telemedicine (audio/video) technology. Melanie Reeves has verbally consented to this TeleHealth visit. The patient is located at home, the provider is located at home. The participants in this visit include the listed provider and patient. The visit was conducted today via MyChart video.  Melanie Reeves is here to discuss her progress with her Melanie treatment plan along with follow-up of her Melanie related diagnoses.   Today's visit was # 38 Starting weight: 294 lbs Starting date: 11/29/2020 Weight at last in office visit: 259 lbs on 03/03/23 Total weight loss: 35 lbs at last in office visit on 03/03/23. Weight at home today: 265 lbs.  Nutrition Plan: practicing portion control and making smarter food choices, such as increasing vegetables and decreasing simple carbohydrates   Current exercise:  none  Interim History:  Reports she is eating chips (corn chips, pork skins) more than she should. She eats a large portion. She has had issues with fluid retention recently.  Reports good compliance with Lasix.  She eats 2 meal per day- breakfast and dinner. Has protein at these meals. Eating vegetables once daily.  She reports self sabotage.  She loses weight and then gives herself permission to eat food she had previously abstained from such as grits.  Hunger controlled but has excessive food cravings. Assessment/Plan:  1. Type 2 Diabetes Mellitus with hyperglycemia, with long-term current use of insulin HgbA1c is not at goal. Last A1c was 7.2.  Managed by Dr. Shawnee Knapp. CBGs:  In range only 66% of the time over the 4th of July holiday.  Episodes of hypoglycemia: no Medication(s): canagliflozin-metformin [phone] mg 2 tabs daily with supper, Mounjaro 15 mg weekly,  Toujeo  80 units daily.   Lab Results  Component Value Date   HGBA1C 7.2 (H) 03/11/2022   HGBA1C 6.9 (H) 11/26/2021   HGBA1C 6.8 (H) 10/16/2021   Lab Results  Component Value  Date   MICROALBUR <0.7 07/15/2021   LDLCALC 66 10/16/2021   CREATININE 0.88 11/18/2022   Lab Results  Component Value Date   GFR 58.46 (L) 03/11/2022   GFR 60.03 11/26/2021   GFR 56.66 (L) 07/15/2021    Plan: Continue and refill Mounjaro 15 mg SQ weekly Reduce simple carbohydrates in diet.  2. CHF Reports increased lower extremity edema recently.  Reports good compliance with Lasix.  Sodium intake increased recently (chips).  Denies shortness of breath. Echo on 01/26/2023 shows EF of 55 to 60%.  Plan: Work on reducing sodium in diet. Continue all medications as directed by cardiology. Follow-up with cardiology as directed-next scheduled appointment is 09/22/2023.  3. Morbid Melanie: Current BMI 48  Melanie Reeves is currently in the action stage of change. As such, her goal is to continue with weight loss efforts.  She has agreed to practicing portion control and making smarter food choices, such as increasing vegetables and decreasing simple carbohydrates.  1. Discussed possibly switching to category 3 plan but she prefers to stay with portion control smart choices. 2.  Switch out chips for homemade popcorn with very little salt. 3.  Concentrate on increasing protein.  Exercise goals: All adults should avoid inactivity. Some physical activity is better than none, and adults who participate in any amount of physical activity gain some health benefits.  Behavioral modification strategies: increasing lean protein intake, decreasing simple carbohydrates , meal planning , planning for success, decreasing sodium intake, decrease junk food, and get rid of junk food in the home.  Melanie Reeves has agreed to follow-up  with our clinic in 4 weeks.  No orders of the defined types were placed in this encounter.   Medications Discontinued During This Encounter  Medication Reason   tirzepatide [facility]) 15 MG/0.5ML Pen Reorder     Meds ordered this encounter  Medications   tirzepatide (MOUNJARO)  15 MG/0.5ML Pen    Sig: Inject 15 mg into the skin once a week.    Dispense:  6 mL    Refill:  0    Order Specific Question:   Supervising Provider    Answer:   Glennis Brink [[id number]]      Objective:   VITALS: Per patient if applicable, see vitals. GENERAL: Alert and in no acute distress. CARDIOPULMONARY: No increased WOB. Speaking in clear sentences.  PSYCH: Pleasant and cooperative. Speech normal rate and rhythm. Affect is appropriate. Insight and judgement are appropriate. Attention is focused, linear, and appropriate.  NEURO: Oriented as arrived to appointment on time with no prompting.   Attestation Statements:   Reviewed by clinician on day of visit: allergies, medications, problem list, medical history, surgical history, family history, social history, and previous encounter notes.  This was prepared with the assistance of Engineer, civil (consulting).  Occasional wrong-word or sound-a-like substitutions may have occurred due to the inherent limitations of voice recognition software.

## 2023-05-26 ENCOUNTER — Encounter (INDEPENDENT_AMBULATORY_CARE_PROVIDER_SITE_OTHER): Payer: Self-pay | Admitting: Family Medicine

## 2023-05-26 ENCOUNTER — Ambulatory Visit (INDEPENDENT_AMBULATORY_CARE_PROVIDER_SITE_OTHER): Payer: PPO | Admitting: Family Medicine

## 2023-05-26 VITALS — BP 138/75 | HR 79 | Temp 97.6°F | Ht 61.0 in | Wt 260.0 lb

## 2023-05-26 DIAGNOSIS — Z6841 Body Mass Index (BMI) 40.0 and over, adult: Secondary | ICD-10-CM

## 2023-05-26 DIAGNOSIS — E1165 Type 2 diabetes mellitus with hyperglycemia: Secondary | ICD-10-CM

## 2023-05-26 DIAGNOSIS — E785 Hyperlipidemia, unspecified: Secondary | ICD-10-CM

## 2023-05-26 DIAGNOSIS — E559 Vitamin D deficiency, unspecified: Secondary | ICD-10-CM

## 2023-05-26 DIAGNOSIS — E669 Obesity, unspecified: Secondary | ICD-10-CM

## 2023-05-26 DIAGNOSIS — Z794 Long term (current) use of insulin: Secondary | ICD-10-CM

## 2023-05-27 NOTE — Progress Notes (Unsigned)
Chief Complaint:   OBESITY Melanie Reeves is here to discuss her progress with her obesity treatment plan along with follow-up of her obesity related diagnoses. Melanie Reeves is on practicing portion control and making smarter food choices, such as increasing vegetables and decreasing simple carbohydrates and states she is following her eating plan approximately 70% of the time. Melanie Reeves states she is doing 0 minutes 0 times per week.  Today's visit was #: 39 Starting weight: 294 lbs Starting date: 11/29/2020 Today's weight: 260 lbs Today's date: 05/26/2023 Total lbs lost to date: 34 Total lbs lost since last in-office visit: 0  Interim History: Patient is working on portion control, and she did some celebration eating over her birthday.  She is working on increasing her protein and vegetables.  She is not journaling as much as being mindful.  Subjective:   1. Uncontrolled type 2 diabetes mellitus with hyperglycemia, with long-term current use of insulin (HCC) Patient notes her glucose increases when she eats more carbohydrates.  She denies feeling hypoglycemic.  2. Hyperlipidemia, unspecified hyperlipidemia type Patient is working on decreasing cholesterol in her diet.  She is on fenofibrate and pravastatin.  She denies chest pain or myalgias.  3. Vitamin D deficiency Patient is on vitamin D prescription once weekly.  She is due to have her level rechecked.  Assessment/Plan:   1. Uncontrolled type 2 diabetes mellitus with hyperglycemia, with long-term current use of insulin (HCC) We will check labs today.  Sinew to work on increasing lean protein and vegetables, and she will follow-up with her PCP for medications.  Nutrition was discussed in depth today.  - CMP14+EGFR - Vitamin B12 - Insulin, random - Hemoglobin A1c - Microalbumin / creatinine urine ratio  2. Hyperlipidemia, unspecified hyperlipidemia type We will check labs today.  Patient will continue with her diet and medications, and we  will continue to follow.  - Lipid Panel With LDL/HDL Ratio  3. Vitamin D deficiency We will check labs today, and we will follow-up at patient's next visit.  - VITAMIN D 25 Hydroxy (Vit-D Deficiency, Fractures)  4. BMI 45.0-49.9, adult (HCC)  5. Obesity, Beginning BMI 55.55 Melanie Reeves is currently in the action stage of change. As such, her goal is to continue with weight loss efforts. She has agreed to practicing portion control and making smarter food choices, such as increasing vegetables and decreasing simple carbohydrates.   Behavioral modification strategies: increasing lean protein intake, decreasing simple carbohydrates, and meal planning and cooking strategies.  Melanie Reeves has agreed to follow-up with our clinic in 4 weeks. She was informed of the importance of frequent follow-up visits to maximize her success with intensive lifestyle modifications for her multiple health conditions.   Melanie Reeves was informed we would discuss her lab results at her next visit unless there is a critical issue that needs to be addressed sooner. Melanie Reeves agreed to keep her next visit at the agreed upon time to discuss these results.  Objective:   Blood pressure 138/75, pulse 79, temperature 97.6 F (36.4 C), height 5\' 1"  (1.549 m), weight 260 lb (117.9 kg), SpO2 91%. Body mass index is 49.13 kg/m.  Lab Results  Component Value Date   CREATININE 1.02 (H) 05/26/2023   BUN 40 (H) 05/26/2023   NA 141 05/26/2023   K 4.4 05/26/2023   CL 100 05/26/2023   CO2 22 05/26/2023   Lab Results  Component Value Date   ALT 18 05/26/2023   AST 21 05/26/2023   ALKPHOS 62 05/26/2023  BILITOT 0.3 05/26/2023   Lab Results  Component Value Date   HGBA1C 6.7 (H) 05/26/2023   HGBA1C 7.2 (H) 03/11/2022   HGBA1C 6.9 (H) 11/26/2021   HGBA1C 6.8 (H) 10/16/2021   HGBA1C 6.5 07/15/2021   Lab Results  Component Value Date   INSULIN WILL FOLLOW 05/26/2023   INSULIN 34.8 (H) 10/16/2021   Lab Results  Component Value Date    TSH 1.217 11/24/2020   Lab Results  Component Value Date   CHOL 147 05/26/2023   HDL 42 05/26/2023   LDLCALC 50 05/26/2023   LDLDIRECT 72.0 02/28/2020   TRIG 366 (H) 05/26/2023   CHOLHDL 3 07/15/2021   Lab Results  Component Value Date   VD25OH 57.0 05/26/2023   VD25OH 43.1 10/16/2021   VD25OH 36.2 06/05/2021   Lab Results  Component Value Date   WBC 11.7 (H) 11/18/2022   HGB 13.6 11/18/2022   HCT 42.9 11/18/2022   MCV 85.1 11/18/2022   PLT 256 11/18/2022   Lab Results  Component Value Date   FERRITIN 264 10/25/2020   Attestation Statements:   Reviewed by clinician on day of visit: allergies, medications, problem list, medical history, surgical history, family history, social history, and previous encounter notes.  Time spent on visit including pre-visit chart review and post-visit care and charting was 45 minutes.   I, Burt Knack, am acting as transcriptionist for Quillian Quince, MD.  I have reviewed the above documentation for accuracy and completeness, and I agree with the above. -  Quillian Quince, MD

## 2023-05-28 LAB — LIPID PANEL WITH LDL/HDL RATIO
Cholesterol, Total: 147 mg/dL (ref 100–199)
HDL: 42 mg/dL
LDL Chol Calc (NIH): 50 mg/dL (ref 0–99)
LDL/HDL Ratio: 1.2 ratio (ref 0.0–3.2)
Triglycerides: 366 mg/dL — ABNORMAL HIGH (ref 0–149)
VLDL Cholesterol Cal: 55 mg/dL — ABNORMAL HIGH (ref 5–40)

## 2023-05-28 LAB — VITAMIN B12: Vitamin B-12: 616 pg/mL (ref 232–1245)

## 2023-05-28 LAB — VITAMIN D 25 HYDROXY (VIT D DEFICIENCY, FRACTURES): Vit D, 25-Hydroxy: 57 ng/mL (ref 30.0–100.0)

## 2023-06-16 NOTE — Progress Notes (Signed)
TeleHealth Visit:  This visit was completed with telemedicine (audio/video) technology. Melanie Reeves has verbally consented to this TeleHealth visit. The patient is located at home, the provider is located at home. The participants in this visit include the listed provider and patient. The visit was conducted today via MyChart video.  OBESITY Melanie Reeves is here to discuss her progress with her obesity treatment plan along with follow-up of her obesity related diagnoses.   Today's visit was # 40 Starting weight: 294 lbs Starting date: 11/29/2020 Weight at last in office visit: 260 lbs on 05/26/23 Total weight loss: 34 lbs at last in office visit on 05/26/23. Today's reported weight (06/17/23): none reported  Nutrition Plan: practicing portion control and making smarter food choices, such as increasing vegetables and decreasing simple carbohydrates - 0% adherence.  Current exercise: none  Interim History:  She has been struggling with eating too many potato chips. She feels she has lost focus. She is currently on vacation and has been eating ice cream and other indulgent things. Protein intake is fair. Eating 2-3 meals per day.  Eating all of the prescribed protein: no Skipping meals: Yes, occasionally  Drinking adequate water: Yes Drinking sugar sweetened beverages: No Hunger controlled: well controlled. Cravings controlled:  moderately controlled.  Assessment/Plan:  We discussed recent lab results in depth.  1. Type 2 Diabetes Mellitus with other specified complication, with long-term current use of insulin HgbA1c is at goal. Last A1c was 6.7, down from 7.2. CBGs: Fasting 120-150 Episodes of hypoglycemia: no Medication(s): Mounjaro 15 mg SQ weekly, canagliflozin/metformin 150/1000 2 tablets with supper, Toujeo 80 units nightly.  Lab Results  Component Value Date   HGBA1C 6.7 (H) 05/26/2023   HGBA1C 7.2 (H) 03/11/2022   HGBA1C 6.9 (H) 11/26/2021   Lab Results  Component  Value Date   MICROALBUR <0.7 07/15/2021   LDLCALC 50 05/26/2023   CREATININE 1.02 (H) 05/26/2023   Lab Results  Component Value Date   GFR 58.46 (L) 03/11/2022   GFR 60.03 11/26/2021   GFR 56.66 (L) 07/15/2021    Plan: Continue Mounjaro 15 mg SQ weekly, canagliflozin/metformin 150/1000 2 tablets with supper, Toujeo 80 units nightly. Work on reducing simple carbohydrates.  2. Vitamin D Deficiency Vitamin D is at goal of 50.  Most recent vitamin D level was 57.. She is on  prescription ergocalciferol 50,000 IU weekly. Lab Results  Component Value Date   VD25OH 57.0 05/26/2023   VD25OH 43.1 10/16/2021   VD25OH 36.2 06/05/2021    Plan: Continue and refill  prescription ergocalciferol 50,000 IU weekly  3. Hyperlipidemia LDL is not at goal.  Most recent lab work: LDL at goal-50.  Triglycerides elevated at 366.  HDL low at 42. Medication(s): Pravastatin 40 mg daily Cardiovascular risk factors: advanced age (older than 59 for men, 17 for women), diabetes mellitus, dyslipidemia, hypertension, obesity (BMI >= 30 kg/m2), and sedentary lifestyle  Lab Results  Component Value Date   CHOL 147 05/26/2023   HDL 42 05/26/2023   LDLCALC 50 05/26/2023   LDLDIRECT 72.0 02/28/2020   TRIG 366 (H) 05/26/2023   CHOLHDL 3 07/15/2021   CHOLHDL 3 09/17/2020   CHOLHDL 4 02/28/2020   Lab Results  Component Value Date   ALT 18 05/26/2023   AST 21 05/26/2023   ALKPHOS 62 05/26/2023   BILITOT 0.3 05/26/2023   The 10-year ASCVD risk score (Arnett DK, et al., 2019) is: 25.3%   Values used to calculate the score:     Age: 72 years  Sex: Female     Is Non-Hispanic African American: No     Diabetic: Yes     Tobacco smoker: No     Systolic Blood Pressure: 138 mmHg     Is BP treated: Yes     HDL Cholesterol: 42 mg/dL     Total Cholesterol: 147 mg/dL  Plan: Continue statin per cardiology.. Start exercise. Reduce carbohydrates.  4. Morbid Obesity: Current BMI 49  Melanie Reeves is currently  in the action stage of change. As such, her goal is to continue with weight loss efforts.  She has agreed to practicing portion control and making smarter food choices, such as increasing vegetables and decreasing simple carbohydrates.  1.  Discussed that she should avoid buying simple carbohydrates like potato chips when she grocery shops.  Exercise goals: start chair exercise 3 days per week  Behavioral modification strategies: increasing lean protein intake, decreasing simple carbohydrates , and planning for success.  Melanie Reeves has agreed to follow-up with our clinic in 4 weeks.  No orders of the defined types were placed in this encounter.   Medications Discontinued During This Encounter  Medication Reason   Continuous Glucose Sensor (FREESTYLE LIBRE 3 SENSOR) MISC Reorder   Vitamin D, Ergocalciferol, (DRISDOL) 1.25 MG (50000 UNIT) CAPS capsule Reorder     Meds ordered this encounter  Medications   Continuous Glucose Sensor (FREESTYLE LIBRE 3 SENSOR) MISC    Sig: Place 1 sensor on the skin every 14 days. Use to check glucose continuously    Dispense:  6 each    Refill:  0    DX Code Needed  .    Order Specific Question:   Supervising Provider    Answer:   Glennis Brink [[id number]]   Vitamin D, Ergocalciferol, (DRISDOL) 1.25 MG (50000 UNIT) CAPS capsule    Sig: Take 1 capsule (50,000 Units total) by mouth every 7 (seven) days.    Dispense:  4 capsule    Refill:  0    Order Specific Question:   Supervising Provider    Answer:   Glennis Brink [[id number]]      Objective:   VITALS: Per patient if applicable, see vitals. GENERAL: Alert and in no acute distress. CARDIOPULMONARY: No increased WOB. Speaking in clear sentences.  PSYCH: Pleasant and cooperative. Speech normal rate and rhythm. Affect is appropriate. Insight and judgement are appropriate. Attention is focused, linear, and appropriate.  NEURO: Oriented as arrived to appointment on time with no prompting.   Attestation  Statements:   Reviewed by clinician on day of visit: allergies, medications, problem list, medical history, surgical history, family history, social history, and previous encounter notes.   This was prepared with the assistance of Engineer, civil (consulting).  Occasional wrong-word or sound-a-like substitutions may have occurred due to the inherent limitations of voice recognition software.

## 2023-06-17 ENCOUNTER — Encounter (INDEPENDENT_AMBULATORY_CARE_PROVIDER_SITE_OTHER): Payer: Self-pay | Admitting: Family Medicine

## 2023-06-17 ENCOUNTER — Telehealth (INDEPENDENT_AMBULATORY_CARE_PROVIDER_SITE_OTHER): Payer: PPO | Admitting: Family Medicine

## 2023-06-17 ENCOUNTER — Other Ambulatory Visit (INDEPENDENT_AMBULATORY_CARE_PROVIDER_SITE_OTHER): Payer: Self-pay | Admitting: Family Medicine

## 2023-06-17 DIAGNOSIS — E1169 Type 2 diabetes mellitus with other specified complication: Secondary | ICD-10-CM

## 2023-06-17 DIAGNOSIS — Z6841 Body Mass Index (BMI) 40.0 and over, adult: Secondary | ICD-10-CM

## 2023-06-17 DIAGNOSIS — E559 Vitamin D deficiency, unspecified: Secondary | ICD-10-CM

## 2023-06-17 DIAGNOSIS — E785 Hyperlipidemia, unspecified: Secondary | ICD-10-CM

## 2023-06-17 DIAGNOSIS — Z794 Long term (current) use of insulin: Secondary | ICD-10-CM

## 2023-06-17 DIAGNOSIS — Z7985 Long-term (current) use of injectable non-insulin antidiabetic drugs: Secondary | ICD-10-CM

## 2023-06-17 MED ORDER — VITAMIN D (ERGOCALCIFEROL) 1.25 MG (50000 UNIT) PO CAPS
50000.0000 [IU] | ORAL_CAPSULE | ORAL | 0 refills | Status: DC
Start: 2023-06-17 — End: 2023-08-05

## 2023-06-17 MED ORDER — FREESTYLE LIBRE 3 SENSOR MISC: 0 refills | Status: AC

## 2023-06-18 ENCOUNTER — Telehealth (INDEPENDENT_AMBULATORY_CARE_PROVIDER_SITE_OTHER): Payer: PPO | Admitting: Family Medicine

## 2023-06-19 ENCOUNTER — Other Ambulatory Visit (INDEPENDENT_AMBULATORY_CARE_PROVIDER_SITE_OTHER): Payer: Self-pay | Admitting: Family Medicine

## 2023-06-19 DIAGNOSIS — E559 Vitamin D deficiency, unspecified: Secondary | ICD-10-CM

## 2023-07-01 ENCOUNTER — Telehealth: Payer: Self-pay | Admitting: Internal Medicine

## 2023-07-01 NOTE — Telephone Encounter (Signed)
Called patient back. Patient complaining of gaining 12 pounds in 5 days, SOB, and BLE edema. Encouraged patient to take an extra dose of Lasix 40 mg, as prescribed. Encouraged patient to go to ED if her symptoms do not improve with the extra dose or her symptoms get worse. Made patient an appointment with DOD on Friday. Will send message to Dr. Tenny Craw, so she is aware, and if she has any further advisement.

## 2023-07-01 NOTE — Telephone Encounter (Signed)
Pt c/o swelling/edema: STAT if pt has developed SOB within 24 hours  If swelling, where is the swelling located? Legs  How much weight have you gained and in what time span? 12 lbs in 5 days   Have you gained 2 pounds in a day or 5 pounds in a week? Yes  Do you have a log of your daily weights (if so, list)?  09/12 269 lbs 09/17 281 lbs   Are you currently taking a fluid pill? Yes  Are you currently SOB? No   Have you traveled recently in a car or plane for an extended period of time? Yes, [place]  Patient saw [facility] who advised her to call and schedule to be seen. She is setup for the first available at this time 09/30 with PA Perlie Gold. She was advised she is in congestive heart failure and SOB could be heard.  Please advise.

## 2023-07-02 NOTE — Progress Notes (Addendum)
status: Former    Current packs/day: 0.00    Average packs/day: 2.0 packs/day for 20.0 years (40.0 ttl pk-yrs)    Types: Cigarettes    Start date: 11/22/1973    Quit date: 11/22/1993    Years since quitting: 29.6   Smokeless tobacco: Former  Building services engineer status: Never Used  Substance Use Topics   Alcohol use: Yes    Comment: Rare   Drug use: No     Family Hx: Family History  Problem Relation Age of Onset   Heart disease Father        MI   Cancer - Lung Father        Small cell Lung CA   Diabetes Father    Hypertension Father    Hyperlipidemia Father    Cancer Father    Depression Father    Anxiety disorder Father    Hypertension Mother    Hyperlipidemia Mother    Stroke Mother    Kidney disease Mother    Depression Mother    Anxiety disorder Mother    Bipolar disorder Mother    Eating disorder Mother    Obesity Mother      Review of Systems:   Please see the history of present illness.    All other systems reviewed and are negative.     EKGs/Labs/Other Test Reviewed:   EKG:    EKG Interpretation Date/Time:  Friday July 03 2023 10:08:11 EDT Ventricular Rate:  81 PR Interval:  172 QRS Duration:  160 QT Interval:  418 QTC Calculation: 485 R Axis:   -62  Text Interpretation: Normal sinus rhythm Left axis deviation Non-specific  intra-ventricular conduction block Minimal voltage criteria for LVH, may be normal variant ( Cornell product ) Lateral infarct (cited on or before 30-Mar-2023) Inferior infarct , age undetermined When compared with ECG of 30-Mar-2023 12:07, QRS axis Shifted left Inferior infarct is now Present Questionable change in initial forces of Lateral leads T wave inversion no longer evident in Inferior leads Confirmed by Alverda Skeans ([id number]) on 07/03/2023 10:12:56 AM        Prior CV studies reviewed: Cardiac Studies & Procedures     STRESS TESTS  MYOCARDIAL PERFUSION IMAGING 10/01/2017  Narrative  Nuclear stress EF: 45%.  There was no ST segment deviation noted during stress.  The left ventricular ejection fraction is mildly decreased (45-54%).  This is an intermediate risk study due to reduced systolic function. No ischemia noted.  Abnormal resting perfusion images noted in the septum, consistent with LBBB.   ECHOCARDIOGRAM  ECHOCARDIOGRAM COMPLETE 01/26/2023  Narrative ECHOCARDIOGRAM REPORT    Patient Name:   Melanie Reeves Date of Exam: 01/26/2023 Medical Rec #:  [id number]      Height:       61.0 in Accession #:    [id number]     Weight:       271.0 lb Date of Birth:  [birth year]       BSA:          2.149 m Patient Age:    71 years       BP:           140/90 mmHg Patient Gender: F              HR:           98 bpm. Exam Location:  [address]  Procedure: 2D Echo, 3D Echo, Cardiac Doppler, Color Doppler and Intracardiac Opacification Agent  Indications:  Cardiology Office Note:   Date:  07/03/2023  ID:  Melanie Reeves, DOB 10-11-1951, MRN [id number] PCP:  Eartha Inch, MD  [facility] Providers Primary Cardiologist:  Dietrich Pates, MD DOD Cardiologist:  Alverda Skeans, MD Referring MD: Eartha Inch, MD   Chief Complaint/Reason for Referral: DOD visit for shortness of breath and weight gain ASSESSMENT:    1. Chronic diastolic CHF (congestive heart failure) (HCC)   2. Paroxysmal atrial fibrillation (HCC)   3. Essential hypertension   4. Hyperlipidemia LDL goal <70   5. Aortic atherosclerosis (HCC)   6. Stage 3a chronic kidney disease (HCC)     PLAN:   In order of problems listed above: 1.  Chronic diastolic heart failure: Continue canagliflozin and will convert atenolol to Toprol XL 25 mg daily.  Discontinue amlodipine and start losartan 25 mg daily given diastolic dysfunction.  Increase Lasix to 40 mg 3 times daily for 3 days then down to 40 mg twice daily indefinitely.  Will check BMP next week.  Consider starting spironolactone in the future.  She already has a scheduled appointment in a few weeks for follow-up. 2.  Paroxysmal atrial fibrillation: Continue Xarelto and change to Toprol. 3.  Hypertension: Blood pressure is well-controlled today.  Discontinue amlodipine and start losartan 25 mg daily. 4.  Hyperlipidemia: Lipid panel was at goal in August. 5.  Aortic atherosclerosis: Continue statin, Xarelto, and strict blood pressure control. 6.  CKD stage III: Continue canagliflozin and start losartan 25 mg daily.      Dispo: Previously scheduled appointment in 10 days.   Medication Adjustments/Labs and Tests Ordered: Current medicines are reviewed at length with the patient today.  Concerns regarding medicines are outlined above.  The following changes have been made:     Labs/tests ordered: Orders Placed This Encounter  Procedures   Basic Metabolic Panel (BMET)   EKG 12-Lead    Medication Changes: Meds ordered  this encounter  Medications   losartan (COZAAR) 25 MG tablet    Sig: Take 1 tablet (25 mg total) by mouth daily.    Dispense:  90 tablet    Refill:  3    Stop amlodipine   furosemide (LASIX) 40 MG tablet    Sig: Take 1 tablet (40 mg total) by mouth 2 (two) times daily.    Dispense:  180 tablet    Refill:  3    Current medicines are reviewed at length with the patient today.  The patient does not have concerns regarding medicines.  History of Present Illness:   FOCUSED PROBLEM LIST:   Paroxysmal atrial fibrillation CV 2 score of 5 on Xarelto Hyperlipidemia Hypertension Chronic diastolic heart failure Aortic atherosclerosis on chest CT 2022 CKD stage III  The patient is a 72 y.o. female with the indicated medical history here for DOD visit due to worsening shortness of breath and weight gain.  The patient was last seen in our office in June of this year for preprocedural assessment prior to a colonoscopy.  She was doing well but did admit to dietary indiscretion at that time.  No medication changes were made.  The patient tells me that she [place] about 11 [place]  She admits to dietary indiscretion and likes to eat pork grinds and potato chips.  She does not sleep flat.  She typically sleeps in a recliner.  She endorses increasing shortness of breath with NYHA class II symptoms.  She is not short of  Cardiology Office Note:   Date:  07/03/2023  ID:  Melanie Reeves, DOB 10-11-1951, MRN [id number] PCP:  Eartha Inch, MD  [facility] Providers Primary Cardiologist:  Dietrich Pates, MD DOD Cardiologist:  Alverda Skeans, MD Referring MD: Eartha Inch, MD   Chief Complaint/Reason for Referral: DOD visit for shortness of breath and weight gain ASSESSMENT:    1. Chronic diastolic CHF (congestive heart failure) (HCC)   2. Paroxysmal atrial fibrillation (HCC)   3. Essential hypertension   4. Hyperlipidemia LDL goal <70   5. Aortic atherosclerosis (HCC)   6. Stage 3a chronic kidney disease (HCC)     PLAN:   In order of problems listed above: 1.  Chronic diastolic heart failure: Continue canagliflozin and will convert atenolol to Toprol XL 25 mg daily.  Discontinue amlodipine and start losartan 25 mg daily given diastolic dysfunction.  Increase Lasix to 40 mg 3 times daily for 3 days then down to 40 mg twice daily indefinitely.  Will check BMP next week.  Consider starting spironolactone in the future.  She already has a scheduled appointment in a few weeks for follow-up. 2.  Paroxysmal atrial fibrillation: Continue Xarelto and change to Toprol. 3.  Hypertension: Blood pressure is well-controlled today.  Discontinue amlodipine and start losartan 25 mg daily. 4.  Hyperlipidemia: Lipid panel was at goal in August. 5.  Aortic atherosclerosis: Continue statin, Xarelto, and strict blood pressure control. 6.  CKD stage III: Continue canagliflozin and start losartan 25 mg daily.      Dispo: Previously scheduled appointment in 10 days.   Medication Adjustments/Labs and Tests Ordered: Current medicines are reviewed at length with the patient today.  Concerns regarding medicines are outlined above.  The following changes have been made:     Labs/tests ordered: Orders Placed This Encounter  Procedures   Basic Metabolic Panel (BMET)   EKG 12-Lead    Medication Changes: Meds ordered  this encounter  Medications   losartan (COZAAR) 25 MG tablet    Sig: Take 1 tablet (25 mg total) by mouth daily.    Dispense:  90 tablet    Refill:  3    Stop amlodipine   furosemide (LASIX) 40 MG tablet    Sig: Take 1 tablet (40 mg total) by mouth 2 (two) times daily.    Dispense:  180 tablet    Refill:  3    Current medicines are reviewed at length with the patient today.  The patient does not have concerns regarding medicines.  History of Present Illness:   FOCUSED PROBLEM LIST:   Paroxysmal atrial fibrillation CV 2 score of 5 on Xarelto Hyperlipidemia Hypertension Chronic diastolic heart failure Aortic atherosclerosis on chest CT 2022 CKD stage III  The patient is a 72 y.o. female with the indicated medical history here for DOD visit due to worsening shortness of breath and weight gain.  The patient was last seen in our office in June of this year for preprocedural assessment prior to a colonoscopy.  She was doing well but did admit to dietary indiscretion at that time.  No medication changes were made.  The patient tells me that she gained about 11 pounds [place]  She admits to dietary indiscretion and likes to eat pork grinds and potato chips.  She does not sleep flat.  She typically sleeps in a recliner.  She endorses increasing shortness of breath with NYHA class II symptoms.  She is not short of  Cardiology Office Note:   Date:  07/03/2023  ID:  Melanie Reeves, DOB 10-11-1951, MRN [id number] PCP:  Eartha Inch, MD  [facility] Providers Primary Cardiologist:  Dietrich Pates, MD DOD Cardiologist:  Alverda Skeans, MD Referring MD: Eartha Inch, MD   Chief Complaint/Reason for Referral: DOD visit for shortness of breath and weight gain ASSESSMENT:    1. Chronic diastolic CHF (congestive heart failure) (HCC)   2. Paroxysmal atrial fibrillation (HCC)   3. Essential hypertension   4. Hyperlipidemia LDL goal <70   5. Aortic atherosclerosis (HCC)   6. Stage 3a chronic kidney disease (HCC)     PLAN:   In order of problems listed above: 1.  Chronic diastolic heart failure: Continue canagliflozin and will convert atenolol to Toprol XL 25 mg daily.  Discontinue amlodipine and start losartan 25 mg daily given diastolic dysfunction.  Increase Lasix to 40 mg 3 times daily for 3 days then down to 40 mg twice daily indefinitely.  Will check BMP next week.  Consider starting spironolactone in the future.  She already has a scheduled appointment in a few weeks for follow-up. 2.  Paroxysmal atrial fibrillation: Continue Xarelto and change to Toprol. 3.  Hypertension: Blood pressure is well-controlled today.  Discontinue amlodipine and start losartan 25 mg daily. 4.  Hyperlipidemia: Lipid panel was at goal in August. 5.  Aortic atherosclerosis: Continue statin, Xarelto, and strict blood pressure control. 6.  CKD stage III: Continue canagliflozin and start losartan 25 mg daily.      Dispo: Previously scheduled appointment in 10 days.   Medication Adjustments/Labs and Tests Ordered: Current medicines are reviewed at length with the patient today.  Concerns regarding medicines are outlined above.  The following changes have been made:     Labs/tests ordered: Orders Placed This Encounter  Procedures   Basic Metabolic Panel (BMET)   EKG 12-Lead    Medication Changes: Meds ordered  this encounter  Medications   losartan (COZAAR) 25 MG tablet    Sig: Take 1 tablet (25 mg total) by mouth daily.    Dispense:  90 tablet    Refill:  3    Stop amlodipine   furosemide (LASIX) 40 MG tablet    Sig: Take 1 tablet (40 mg total) by mouth 2 (two) times daily.    Dispense:  180 tablet    Refill:  3    Current medicines are reviewed at length with the patient today.  The patient does not have concerns regarding medicines.  History of Present Illness:   FOCUSED PROBLEM LIST:   Paroxysmal atrial fibrillation CV 2 score of 5 on Xarelto Hyperlipidemia Hypertension Chronic diastolic heart failure Aortic atherosclerosis on chest CT 2022 CKD stage III  The patient is a 72 y.o. female with the indicated medical history here for DOD visit due to worsening shortness of breath and weight gain.  The patient was last seen in our office in June of this year for preprocedural assessment prior to a colonoscopy.  She was doing well but did admit to dietary indiscretion at that time.  No medication changes were made.  The patient tells me that she gained about 11 pounds [place]  She admits to dietary indiscretion and likes to eat pork grinds and potato chips.  She does not sleep flat.  She typically sleeps in a recliner.  She endorses increasing shortness of breath with NYHA class II symptoms.  She is not short of  Cardiology Office Note:   Date:  07/03/2023  ID:  Melanie Reeves, DOB 10-11-1951, MRN [id number] PCP:  Eartha Inch, MD  [facility] Providers Primary Cardiologist:  Dietrich Pates, MD DOD Cardiologist:  Alverda Skeans, MD Referring MD: Eartha Inch, MD   Chief Complaint/Reason for Referral: DOD visit for shortness of breath and weight gain ASSESSMENT:    1. Chronic diastolic CHF (congestive heart failure) (HCC)   2. Paroxysmal atrial fibrillation (HCC)   3. Essential hypertension   4. Hyperlipidemia LDL goal <70   5. Aortic atherosclerosis (HCC)   6. Stage 3a chronic kidney disease (HCC)     PLAN:   In order of problems listed above: 1.  Chronic diastolic heart failure: Continue canagliflozin and will convert atenolol to Toprol XL 25 mg daily.  Discontinue amlodipine and start losartan 25 mg daily given diastolic dysfunction.  Increase Lasix to 40 mg 3 times daily for 3 days then down to 40 mg twice daily indefinitely.  Will check BMP next week.  Consider starting spironolactone in the future.  She already has a scheduled appointment in a few weeks for follow-up. 2.  Paroxysmal atrial fibrillation: Continue Xarelto and change to Toprol. 3.  Hypertension: Blood pressure is well-controlled today.  Discontinue amlodipine and start losartan 25 mg daily. 4.  Hyperlipidemia: Lipid panel was at goal in August. 5.  Aortic atherosclerosis: Continue statin, Xarelto, and strict blood pressure control. 6.  CKD stage III: Continue canagliflozin and start losartan 25 mg daily.      Dispo: Previously scheduled appointment in 10 days.   Medication Adjustments/Labs and Tests Ordered: Current medicines are reviewed at length with the patient today.  Concerns regarding medicines are outlined above.  The following changes have been made:     Labs/tests ordered: Orders Placed This Encounter  Procedures   Basic Metabolic Panel (BMET)   EKG 12-Lead    Medication Changes: Meds ordered  this encounter  Medications   losartan (COZAAR) 25 MG tablet    Sig: Take 1 tablet (25 mg total) by mouth daily.    Dispense:  90 tablet    Refill:  3    Stop amlodipine   furosemide (LASIX) 40 MG tablet    Sig: Take 1 tablet (40 mg total) by mouth 2 (two) times daily.    Dispense:  180 tablet    Refill:  3    Current medicines are reviewed at length with the patient today.  The patient does not have concerns regarding medicines.  History of Present Illness:   FOCUSED PROBLEM LIST:   Paroxysmal atrial fibrillation CV 2 score of 5 on Xarelto Hyperlipidemia Hypertension Chronic diastolic heart failure Aortic atherosclerosis on chest CT 2022 CKD stage III  The patient is a 72 y.o. female with the indicated medical history here for DOD visit due to worsening shortness of breath and weight gain.  The patient was last seen in our office in June of this year for preprocedural assessment prior to a colonoscopy.  She was doing well but did admit to dietary indiscretion at that time.  No medication changes were made.  The patient tells me that she gained about 11 pounds [place]  She admits to dietary indiscretion and likes to eat pork grinds and potato chips.  She does not sleep flat.  She typically sleeps in a recliner.  She endorses increasing shortness of breath with NYHA class II symptoms.  She is not short of  status: Former    Current packs/day: 0.00    Average packs/day: 2.0 packs/day for 20.0 years (40.0 ttl pk-yrs)    Types: Cigarettes    Start date: 11/22/1973    Quit date: 11/22/1993    Years since quitting: 29.6   Smokeless tobacco: Former  Building services engineer status: Never Used  Substance Use Topics   Alcohol use: Yes    Comment: Rare   Drug use: No     Family Hx: Family History  Problem Relation Age of Onset   Heart disease Father        MI   Cancer - Lung Father        Small cell Lung CA   Diabetes Father    Hypertension Father    Hyperlipidemia Father    Cancer Father    Depression Father    Anxiety disorder Father    Hypertension Mother    Hyperlipidemia Mother    Stroke Mother    Kidney disease Mother    Depression Mother    Anxiety disorder Mother    Bipolar disorder Mother    Eating disorder Mother    Obesity Mother      Review of Systems:   Please see the history of present illness.    All other systems reviewed and are negative.     EKGs/Labs/Other Test Reviewed:   EKG:    EKG Interpretation Date/Time:  Friday July 03 2023 10:08:11 EDT Ventricular Rate:  81 PR Interval:  172 QRS Duration:  160 QT Interval:  418 QTC Calculation: 485 R Axis:   -62  Text Interpretation: Normal sinus rhythm Left axis deviation Non-specific  intra-ventricular conduction block Minimal voltage criteria for LVH, may be normal variant ( Cornell product ) Lateral infarct (cited on or before 30-Mar-2023) Inferior infarct , age undetermined When compared with ECG of 30-Mar-2023 12:07, QRS axis Shifted left Inferior infarct is now Present Questionable change in initial forces of Lateral leads T wave inversion no longer evident in Inferior leads Confirmed by Alverda Skeans ([id number]) on 07/03/2023 10:12:56 AM        Prior CV studies reviewed: Cardiac Studies & Procedures     STRESS TESTS  MYOCARDIAL PERFUSION IMAGING 10/01/2017  Narrative  Nuclear stress EF: 45%.  There was no ST segment deviation noted during stress.  The left ventricular ejection fraction is mildly decreased (45-54%).  This is an intermediate risk study due to reduced systolic function. No ischemia noted.  Abnormal resting perfusion images noted in the septum, consistent with LBBB.   ECHOCARDIOGRAM  ECHOCARDIOGRAM COMPLETE 01/26/2023  Narrative ECHOCARDIOGRAM REPORT    Patient Name:   Melanie Reeves Date of Exam: 01/26/2023 Medical Rec #:  [id number]      Height:       61.0 in Accession #:    [id number]     Weight:       271.0 lb Date of Birth:  [birth year]       BSA:          2.149 m Patient Age:    71 years       BP:           140/90 mmHg Patient Gender: F              HR:           98 bpm. Exam Location:  [address]  Procedure: 2D Echo, 3D Echo, Cardiac Doppler, Color Doppler and Intracardiac Opacification Agent  Indications:

## 2023-07-03 ENCOUNTER — Encounter: Payer: Self-pay | Admitting: Internal Medicine

## 2023-07-03 ENCOUNTER — Ambulatory Visit: Payer: PPO | Attending: Internal Medicine | Admitting: Internal Medicine

## 2023-07-03 VITALS — BP 126/66 | HR 81 | Ht 61.0 in | Wt 273.0 lb

## 2023-07-03 DIAGNOSIS — I10 Essential (primary) hypertension: Secondary | ICD-10-CM

## 2023-07-03 DIAGNOSIS — E785 Hyperlipidemia, unspecified: Secondary | ICD-10-CM

## 2023-07-03 DIAGNOSIS — I5032 Chronic diastolic (congestive) heart failure: Secondary | ICD-10-CM

## 2023-07-03 DIAGNOSIS — N1831 Chronic kidney disease, stage 3a: Secondary | ICD-10-CM

## 2023-07-03 DIAGNOSIS — I480 Paroxysmal atrial fibrillation: Secondary | ICD-10-CM

## 2023-07-03 DIAGNOSIS — I700 Atherosclerosis of aorta: Secondary | ICD-10-CM

## 2023-07-03 MED ORDER — LOSARTAN POTASSIUM 25 MG PO TABS: 25.0000 mg | ORAL_TABLET | Freq: Every day | ORAL | 3 refills | Status: DC

## 2023-07-03 MED ORDER — FUROSEMIDE 40 MG PO TABS: 40.0000 mg | ORAL_TABLET | Freq: Two times a day (BID) | ORAL | 3 refills | Status: DC

## 2023-07-03 NOTE — Patient Instructions (Addendum)
Medication Instructions:  Your physician has recommended you make the following change in your medication:  1.) stop amlodipine 2.) start losartan 25 mg - take one tablet daily  3.) FOR 3 DAYS: TAKE LASIX 40 MG--ONE TABLET 3 TIMES A DAY FOR 3 DAYS, THEN DECREASE TO ONE TABLET TWICE DAILY  *If you need a refill on your cardiac medications before your next appointment, please call your pharmacy*   Lab Work: Please return in one week for blood work Designer, jewellery)  If you have labs (blood work) drawn today and your tests are completely normal, you will receive your results only by: [employer] (if you have MyChart) OR A paper copy in the mail If you have any lab test that is abnormal or we need to change your treatment, we will call you to review the results.   Testing/Procedures: none   Follow-Up: At [facility], you and your health needs are our priority.  As part of our continuing mission to provide you with exceptional heart care, we have created designated Provider Care Teams.  These Care Teams include your primary Cardiologist (physician) and Advanced Practice Providers (APPs -  Physician Assistants and Nurse Practitioners) who all work together to provide you with the care you need, when you need it.   Your next appointment:   1 month(s)  Provider: Dr. Tenny Craw or Advanced Practice Provder:  Jari Favre, PA-C, Ronie Spies, PA-C, Robin Searing, NP, Jacolyn Reedy, PA-C, Eligha Bridegroom, NP, Tereso Newcomer, PA-C, or Perlie Gold, PA-C

## 2023-07-10 ENCOUNTER — Ambulatory Visit: Payer: PPO | Attending: Internal Medicine

## 2023-07-10 DIAGNOSIS — N1831 Chronic kidney disease, stage 3a: Secondary | ICD-10-CM

## 2023-07-10 DIAGNOSIS — I5032 Chronic diastolic (congestive) heart failure: Secondary | ICD-10-CM

## 2023-07-11 LAB — BASIC METABOLIC PANEL
BUN/Creatinine Ratio: 49 — ABNORMAL HIGH (ref 12–28)
BUN: 47 mg/dL — ABNORMAL HIGH (ref 8–27)
CO2: 27 mmol/L (ref 20–29)
Calcium: 10.4 mg/dL — ABNORMAL HIGH (ref 8.7–10.3)
Chloride: 98 mmol/L (ref 96–106)
Creatinine, Ser: 0.96 mg/dL (ref 0.57–1.00)
Glucose: 145 mg/dL — ABNORMAL HIGH (ref 70–99)
Potassium: 3.8 mmol/L (ref 3.5–5.2)
Sodium: 140 mmol/L (ref 134–144)
eGFR: 64 mL/min/{1.73_m2} (ref >59)

## 2023-07-12 NOTE — Progress Notes (Deleted)
Cardiology Office Note:   Date:  07/12/2023  ID:  Melanie Reeves, DOB [birth year], MRN [id number] PCP: Eartha Inch, MD  [facility] Providers Cardiologist:  Dietrich Pates, MD { Click to update primary MD,subspecialty MD or APP then REFRESH:1}   History of Present Illness:   Melanie Reeves is a 72 y.o. female ***  Discussed the use of AI scribe software for clinical note transcription with the patient, who gave verbal consent to proceed.  History of Present Illness             Today patient denies chest pain, shortness of breath, lower extremity edema, fatigue, palpitations, melena, hematuria, hemoptysis, diaphoresis, weakness, presyncope, syncope, orthopnea, and PND.   Studies Reviewed:    EKG:        08-Feb-2023  IMPRESSIONS     1. Left ventricular ejection fraction, by estimation, is 55 to 60%. Left  ventricular ejection fraction by 3D volume is 58 %. The left ventricle has  normal function. The left ventricle has no regional wall motion  abnormalities. Left ventricular diastolic   parameters are consistent with Grade I diastolic dysfunction (impaired  relaxation).   2. Right ventricular systolic function is normal. The right ventricular  size is moderately enlarged. There is normal pulmonary artery systolic  pressure. The estimated right ventricular systolic pressure is 35.7 mmHg.   3. The mitral valve is normal in structure. Trivial mitral valve  regurgitation. No evidence of mitral stenosis.   4. The aortic valve is normal in structure. Aortic valve regurgitation is  not visualized. No aortic stenosis is present.   5. The inferior vena cava is normal in size with greater than 50%  respiratory variability, suggesting right atrial pressure of 3 mmHg.   Comparison(s): No significant change from prior study. Prior images  reviewed side by side.   FINDINGS   Left Ventricle: Left ventricular ejection fraction, by estimation, is 55  to 60%. Left ventricular  ejection fraction by 3D volume is 58 %. The left  ventricle has normal function. The left ventricle has no regional wall  motion abnormalities. Definity  contrast agent was given IV to delineate the left ventricular endocardial  borders. The left ventricular internal cavity size was normal in size.  There is no left ventricular hypertrophy. Left ventricular diastolic  parameters are consistent with Grade I  diastolic dysfunction (impaired relaxation).   Risk Assessment/Calculations:   {Does this patient have ATRIAL FIBRILLATION?[phone]} No BP recorded.  {Refresh Note OR Click here to enter BP  :1}***        Physical Exam:   VS:  There were no vitals taken for this visit.   Wt Readings from Last 3 Encounters:  07/03/23 273 lb (123.8 kg)  05/26/23 260 lb (117.9 kg)  03/30/23 268 lb 6.4 oz (121.7 kg)     Physical Exam  Physical Exam           ASSESSMENT AND PLAN:     Assessment and Plan                  {Are you ordering a CV Procedure (e.g. stress test, cath, DCCV, TEE, etc)?   Press F2        :[id number]}   Signed, Perlie Gold, PA-C

## 2023-07-13 ENCOUNTER — Telehealth: Payer: Self-pay | Admitting: Orthopedic Surgery

## 2023-07-13 ENCOUNTER — Ambulatory Visit: Payer: PPO | Admitting: Cardiology

## 2023-07-13 ENCOUNTER — Ambulatory Visit: Payer: PPO | Admitting: Orthopedic Surgery

## 2023-07-13 ENCOUNTER — Telehealth: Payer: Self-pay | Admitting: Physical Medicine and Rehabilitation

## 2023-07-13 DIAGNOSIS — I87333 Chronic venous hypertension (idiopathic) with ulcer and inflammation of bilateral lower extremity: Secondary | ICD-10-CM | POA: Diagnosis not present

## 2023-07-13 NOTE — Telephone Encounter (Signed)
I have scheduled pt an apt for today.

## 2023-07-13 NOTE — Telephone Encounter (Signed)
Per Dr. Lajoyce Corners, we need to see this patient early this week.  I have called her this morning and LMTCB and to ask for me so I can schedule her to be seen.

## 2023-07-13 NOTE — Telephone Encounter (Signed)
Patient called and was returning your call. CB#[phone]

## 2023-07-13 NOTE — Telephone Encounter (Signed)
Pt is scheduled today.

## 2023-07-16 ENCOUNTER — Ambulatory Visit (INDEPENDENT_AMBULATORY_CARE_PROVIDER_SITE_OTHER): Payer: PPO | Admitting: Family Medicine

## 2023-07-19 ENCOUNTER — Encounter: Payer: Self-pay | Admitting: Orthopedic Surgery

## 2023-07-20 ENCOUNTER — Ambulatory Visit: Payer: PPO | Admitting: Orthopedic Surgery

## 2023-07-20 ENCOUNTER — Encounter: Payer: Self-pay | Admitting: Orthopedic Surgery

## 2023-07-20 DIAGNOSIS — L97201 Non-pressure chronic ulcer of unspecified calf limited to breakdown of skin: Secondary | ICD-10-CM

## 2023-07-20 DIAGNOSIS — I83002 Varicose veins of unspecified lower extremity with ulcer of calf: Secondary | ICD-10-CM | POA: Diagnosis not present

## 2023-07-21 ENCOUNTER — Encounter: Payer: Self-pay | Admitting: Orthopedic Surgery

## 2023-07-21 NOTE — Progress Notes (Signed)
Office Visit Note   Patient: Melanie Reeves           Date of Birth: 12-22-1950           MRN: [id number] Visit Date: 07/20/2023              Requested by: Eartha Inch, MD [address] [place],  [place] [address] PCP: Eartha Inch, MD  Chief Complaint  Patient presents with   Right Leg - Follow-up   Left Leg - Follow-up      HPI: Patient is a 72 year old woman who presents in follow-up 1 week status post compression wraps for both lower extremities for venous and lymphatic insufficiency.  Assessment & Plan: Visit Diagnoses:  1. Venous stasis ulcer of calf limited to breakdown of skin with varicose veins, unspecified laterality (HCC)     Plan: Patient has good wrinkling of the skin discussed proceeding with additional multilayer compression wrap versus resuming her compression stockings.  Patient states she would like to proceed with her compression stockings and will follow-up as needed.  Follow-Up Instructions: No follow-ups on file.   Ortho Exam  Patient is alert, oriented, no adenopathy, well-dressed, normal affect, normal respiratory effort. Examination patient has brawny edema with good wrinkling of the skin and up to the mid calf.  Proximal around the knee there is increased swelling but no ulceration there is no cellulitis.  There is improvement in the hematoma of the dorsum of the left foot.  There is no cellulitis in either lower extremity no open ulcers.  Imaging: No results found. No images are attached to the encounter.  Labs: Lab Results  Component Value Date   HGBA1C 6.7 (H) 05/26/2023   HGBA1C 7.2 (H) 03/11/2022   HGBA1C 6.9 (H) 11/26/2021   ESRSEDRATE 75 (H) 03/30/2021   ESRSEDRATE 85 (H) 03/27/2021   CRP 5.3 (H) 04/03/2021   CRP 6.3 (H) 03/30/2021   CRP 9.3 (H) 03/27/2021   REPTSTATUS 03/31/2021 FINAL 03/26/2021   CULT  03/26/2021    NO GROWTH 5 DAYS Performed at [facility], [address] [place], [place]  [address]      Lab Results  Component Value Date   ALBUMIN 4.7 05/26/2023   ALBUMIN 4.7 10/16/2021   ALBUMIN 4.3 07/15/2021    Lab Results  Component Value Date   MG 2.0 11/18/2022   MG 1.8 04/05/2021   MG 1.8 04/03/2021   Lab Results  Component Value Date   VD25OH 57.0 05/26/2023   VD25OH 43.1 10/16/2021   VD25OH 36.2 06/05/2021    No results found for: "PREALBUMIN"    Latest Ref Rng & Units 11/18/2022    3:52 PM 06/24/2022   11:53 AM 08/23/2021    4:48 PM  CBC EXTENDED  WBC 4.0 - 10.5 K/uL 11.7  11.3  13.9   RBC 3.87 - 5.11 MIL/uL 5.04  4.83  4.82   Hemoglobin 12.0 - 15.0 g/dL 62.1  30.8  65.7   HCT 36.0 - 46.0 % 42.9  40.5  41.4   Platelets 150 - 400 K/uL 256  245  295   NEUT# 1.7 - 7.7 K/uL 7.8     Lymph# 0.7 - 4.0 K/uL 3.0        There is no height or weight on file to calculate BMI.  Orders:  No orders of the defined types were placed in this encounter.  No orders of the defined types were placed in this encounter.  Procedures: No procedures performed  Clinical Data: No additional findings.  ROS:  All other systems negative, except as noted in the HPI. Review of Systems  Objective: Vital Signs: There were no vitals taken for this visit.  Specialty Comments:  No specialty comments available.  PMFS History: Patient Active Problem List   Diagnosis Date Noted   Obesity, Beginning BMI 55.55 01/06/2023   BMI 45.0-49.9, adult (HCC) 11/12/2022   Obesity, Beginning BMI 55.55 11/12/2022   Dehydration 09/25/2022   Depression 03/25/2022   Cellulitis of left lower extremity 03/27/2021   Left foot pain 03/27/2021   Chronic diastolic CHF (congestive heart failure) (HCC) 03/27/2021   Cellulitis 03/26/2021   Other fatigue 11/29/2020   Screening for depression 11/29/2020   Vitamin D deficiency 11/29/2020   B12 deficiency 11/29/2020   Shortness of breath on exertion 11/24/2020   Acute respiratory failure with hypoxia (HCC) 11/24/2020   Obesity, Class  III, BMI 40-49.9 (morbid obesity) (HCC) 10/24/2020   Acute respiratory failure due to COVID-19 (HCC) 10/24/2020   Uncontrolled type 2 diabetes mellitus with hyperglycemia, with long-term current use of insulin (HCC) 10/24/2020   COVID-19 10/23/2020   Paroxysmal atrial fibrillation (HCC) 01/14/2019   Hyperlipidemia 01/14/2019   Diabetes mellitus (HCC) 01/14/2019   OSA (obstructive sleep apnea) 08/16/2014   Abnormal electrocardiogram 08/14/2014   Essential hypertension 08/14/2014   Dyspnea 08/14/2014   Class 3 severe obesity with serious comorbidity and body mass index (BMI) of 50.0 to 59.9 in adult [facility]) 08/14/2014   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   SPINAL STENOSIS 12/20/2009   LOW BACK PAIN 12/20/2009   Past Medical History:  Diagnosis Date   Abnormal electrocardiogram 08/14/2014   LBBB   Anemia    Anxiety    Arthritis    Atrial fibrillation (HCC)    Bilateral swelling of feet    Chest pain    Constipation    Depression    Diabetes mellitus (HCC)    Difficulty walking    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   Qualifier: Diagnosis of  By: Romeo Apple MD, Stanley     Dyspnea 08/14/2014   Edema    Gallbladder problem    GERD (gastroesophageal reflux disease)    Hyperlipidemia    Hypertension    LOW BACK PAIN 12/20/2009   Qualifier: Diagnosis of  By: Romeo Apple MD, Stanley     Morbid obesity (HCC)    OSA (obstructive sleep apnea) 08/16/2014   Osteoarthritis    Other fatigue    Palpitations    Seasonal allergies    Shortness of breath    Shortness of breath on exertion    Sinusitis    SPINAL STENOSIS 12/20/2009   Qualifier: Diagnosis of  By: Romeo Apple MD, Duffy Rhody     Stage 3 chronic kidney disease (HCC)     Family History  Problem Relation Age of Onset   Heart disease Father        MI   Cancer - Lung Father        Small cell Lung CA   Diabetes Father    Hypertension Father    Hyperlipidemia Father    Cancer Father    Depression Father    Anxiety disorder Father    Hypertension  Mother    Hyperlipidemia Mother    Stroke Mother    Kidney disease Mother    Depression Mother    Anxiety disorder Mother    Bipolar disorder Mother    Eating disorder Mother    Obesity Mother  Past Surgical History:  Procedure Laterality Date   CHOLECYSTECTOMY     COLONOSCOPY WITH PROPOFOL N/A 11/29/2014   Procedure: COLONOSCOPY WITH PROPOFOL;  Surgeon: Willis Modena, MD;  Location: [facility];  Service: Endoscopy;  Laterality: N/A;   SKIN CANCER EXCISION     TONSILLECTOMY     Social History   Occupational History   Occupation: Retired    Comment: Therapist, sports  Tobacco Use   Smoking status: Former    Current packs/day: 0.00    Average packs/day: 2.0 packs/day for 20.0 years (40.0 ttl pk-yrs)    Types: Cigarettes    Start date: 11/22/1973    Quit date: 11/22/1993    Years since quitting: 29.6   Smokeless tobacco: Former  Building services engineer status: Never Used  Substance and Sexual Activity   Alcohol use: Yes    Comment: Rare   Drug use: No   Sexual activity: Not Currently    Partners: Male

## 2023-07-21 NOTE — Progress Notes (Signed)
Office Visit Note   Patient: Melanie Reeves           Date of Birth: [birth year]           MRN: [id number] Visit Date: 07/13/2023              Requested by: Eartha Inch, MD [address] [place],  [place] [address] PCP: Eartha Inch, MD  Chief Complaint  Patient presents with   Left Foot - Wound Check      HPI: Patient is a 72 year old woman who presents with 2 separate issues.  Patient has had difficulty with venous insufficiency of both lower extremities.  Patient has recently had an ingrown toenail on the right.  Patient states that her primary care physician remove part of the toenail for the paronychial infection.  Patient states she was on Cipro.  Patient states she is now going to the wound center and is on amoxicillin.  Assessment & Plan: Visit Diagnoses:  1. Chronic venous hypertension (idiopathic) with ulcer and inflammation of bilateral lower extremity (HCC)     Plan: Both legs are wrapped with a Dynaflex compression wrap.  Patient will use antibiotic ointment on the toe.  Follow-Up Instructions: Return in about 1 week (around 07/20/2023).   Ortho Exam  Patient is alert, oriented, no adenopathy, well-dressed, normal affect, normal respiratory effort. Examination patient has pitting edema of both lower extremities with brawny edema.  Patient recently struck her foot while getting into a truck and has swelling of the dorsum of the left midfoot.  There is no open ulcers no cellulitis.  She is status post lateral nail excision of the right great toenail.  There is no cellulitis no fluctuance no tenderness to palpation  Imaging: No results found. No images are attached to the encounter.  Labs: Lab Results  Component Value Date   HGBA1C 6.7 (H) 05/26/2023   HGBA1C 7.2 (H) 03/11/2022   HGBA1C 6.9 (H) 11/26/2021   ESRSEDRATE 75 (H) 03/30/2021   ESRSEDRATE 85 (H) 03/27/2021   CRP 5.3 (H) 04/03/2021   CRP 6.3 (H) 03/30/2021   CRP 9.3 (H)  03/27/2021   REPTSTATUS 03/31/2021 FINAL 03/26/2021   CULT  03/26/2021    NO GROWTH 5 DAYS Performed at [facility], [address], [place], [place] [address]      Lab Results  Component Value Date   ALBUMIN 4.7 05/26/2023   ALBUMIN 4.7 10/16/2021   ALBUMIN 4.3 07/15/2021    Lab Results  Component Value Date   MG 2.0 11/18/2022   MG 1.8 04/05/2021   MG 1.8 04/03/2021   Lab Results  Component Value Date   VD25OH 57.0 05/26/2023   VD25OH 43.1 10/16/2021   VD25OH 36.2 06/05/2021    No results found for: "PREALBUMIN"    Latest Ref Rng & Units 11/18/2022    3:52 PM 06/24/2022   11:53 AM 08/23/2021    4:48 PM  CBC EXTENDED  WBC 4.0 - 10.5 K/uL 11.7  11.3  13.9   RBC 3.87 - 5.11 MIL/uL 5.04  4.83  4.82   Hemoglobin 12.0 - 15.0 g/dL 62.1  30.8  65.7   HCT 36.0 - 46.0 % 42.9  40.5  41.4   Platelets 150 - 400 K/uL 256  245  295   NEUT# 1.7 - 7.7 K/uL 7.8     Lymph# 0.7 - 4.0 K/uL 3.0        There is no height or  weight on file to calculate BMI.  Orders:  No orders of the defined types were placed in this encounter.  No orders of the defined types were placed in this encounter.    Procedures: No procedures performed  Clinical Data: No additional findings.  ROS:  All other systems negative, except as noted in the HPI. Review of Systems  Objective: Vital Signs: There were no vitals taken for this visit.  Specialty Comments:  No specialty comments available.  PMFS History: Patient Active Problem List   Diagnosis Date Noted   Obesity, Beginning BMI 55.55 01/06/2023   BMI 45.0-49.9, adult (HCC) 11/12/2022   Obesity, Beginning BMI 55.55 11/12/2022   Dehydration 09/25/2022   Depression 03/25/2022   Cellulitis of left lower extremity 03/27/2021   Left foot pain 03/27/2021   Chronic diastolic CHF (congestive heart failure) (HCC) 03/27/2021   Cellulitis 03/26/2021   Other fatigue 11/29/2020   Screening for depression 11/29/2020   Vitamin D  deficiency 11/29/2020   B12 deficiency 11/29/2020   Shortness of breath on exertion 11/24/2020   Acute respiratory failure with hypoxia (HCC) 11/24/2020   Obesity, Class III, BMI 40-49.9 (morbid obesity) (HCC) 10/24/2020   Acute respiratory failure due to COVID-19 (HCC) 10/24/2020   Uncontrolled type 2 diabetes mellitus with hyperglycemia, with long-term current use of insulin (HCC) 10/24/2020   COVID-19 10/23/2020   Paroxysmal atrial fibrillation (HCC) 01/14/2019   Hyperlipidemia 01/14/2019   Diabetes mellitus (HCC) 01/14/2019   OSA (obstructive sleep apnea) 08/16/2014   Abnormal electrocardiogram 08/14/2014   Essential hypertension 08/14/2014   Dyspnea 08/14/2014   Class 3 severe obesity with serious comorbidity and body mass index (BMI) of 50.0 to 59.9 in adult [facility]) 08/14/2014   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   SPINAL STENOSIS 12/20/2009   LOW BACK PAIN 12/20/2009   Past Medical History:  Diagnosis Date   Abnormal electrocardiogram 08/14/2014   LBBB   Anemia    Anxiety    Arthritis    Atrial fibrillation (HCC)    Bilateral swelling of feet    Chest pain    Constipation    Depression    Diabetes mellitus (HCC)    Difficulty walking    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   Qualifier: Diagnosis of  By: Romeo Apple MD, Stanley     Dyspnea 08/14/2014   Edema    Gallbladder problem    GERD (gastroesophageal reflux disease)    Hyperlipidemia    Hypertension    LOW BACK PAIN 12/20/2009   Qualifier: Diagnosis of  By: Romeo Apple MD, Stanley     Morbid obesity (HCC)    OSA (obstructive sleep apnea) 08/16/2014   Osteoarthritis    Other fatigue    Palpitations    Seasonal allergies    Shortness of breath    Shortness of breath on exertion    Sinusitis    SPINAL STENOSIS 12/20/2009   Qualifier: Diagnosis of  By: Romeo Apple MD, Duffy Rhody     Stage 3 chronic kidney disease (HCC)     Family History  Problem Relation Age of Onset   Heart disease Father        MI   Cancer - Lung Father         Small cell Lung CA   Diabetes Father    Hypertension Father    Hyperlipidemia Father    Cancer Father    Depression Father    Anxiety disorder Father    Hypertension Mother    Hyperlipidemia Mother    Stroke Mother  Kidney disease Mother    Depression Mother    Anxiety disorder Mother    Bipolar disorder Mother    Eating disorder Mother    Obesity Mother     Past Surgical History:  Procedure Laterality Date   CHOLECYSTECTOMY     COLONOSCOPY WITH PROPOFOL N/A 11/29/2014   Procedure: COLONOSCOPY WITH PROPOFOL;  Surgeon: Willis Modena, MD;  Location: [facility];  Service: Endoscopy;  Laterality: N/A;   SKIN CANCER EXCISION     TONSILLECTOMY     Social History   Occupational History   Occupation: Retired    Comment: Therapist, sports  Tobacco Use   Smoking status: Former    Current packs/day: 0.00    Average packs/day: 2.0 packs/day for 20.0 years (40.0 ttl pk-yrs)    Types: Cigarettes    Start date: 11/22/1973    Quit date: 11/22/1993    Years since quitting: 29.6   Smokeless tobacco: Former  Building services engineer status: Never Used  Substance and Sexual Activity   Alcohol use: Yes    Comment: Rare   Drug use: No   Sexual activity: Not Currently    Partners: Male

## 2023-07-27 ENCOUNTER — Ambulatory Visit: Payer: PPO | Attending: Cardiology | Admitting: Cardiology

## 2023-07-27 ENCOUNTER — Encounter: Payer: Self-pay | Admitting: Cardiology

## 2023-07-27 VITALS — BP 120/70 | HR 80 | Ht 61.0 in | Wt 271.8 lb

## 2023-07-27 DIAGNOSIS — I10 Essential (primary) hypertension: Secondary | ICD-10-CM | POA: Diagnosis not present

## 2023-07-27 DIAGNOSIS — E785 Hyperlipidemia, unspecified: Secondary | ICD-10-CM | POA: Diagnosis not present

## 2023-07-27 DIAGNOSIS — Z79899 Other long term (current) drug therapy: Secondary | ICD-10-CM

## 2023-07-27 DIAGNOSIS — I480 Paroxysmal atrial fibrillation: Secondary | ICD-10-CM | POA: Diagnosis not present

## 2023-07-27 DIAGNOSIS — I5032 Chronic diastolic (congestive) heart failure: Secondary | ICD-10-CM

## 2023-07-27 DIAGNOSIS — I700 Atherosclerosis of aorta: Secondary | ICD-10-CM

## 2023-07-27 MED ORDER — LOSARTAN POTASSIUM 25 MG PO TABS: 25.0000 mg | ORAL_TABLET | Freq: Every day | ORAL | 3 refills | Status: DC

## 2023-07-27 MED ORDER — SPIRONOLACTONE 25 MG PO TABS: 12.5000 mg | ORAL_TABLET | Freq: Every day | ORAL | 3 refills | Status: DC

## 2023-07-27 NOTE — Patient Instructions (Signed)
Medication Instructions:   START Aldactone one half (0.5) tablet by mouth ( 12.5 mg ) daily.   *If you need a refill on your cardiac medications before your next appointment, please call your pharmacy*   Lab Work:  Your physician recommends that you return for lab work on Friday,October 18. You can come in on the day of your appointment anytime between 7:30 am -4:30 pm.  Closed from 12:30-1:30 for lunch.    If you have labs (blood work) drawn today and your tests are completely normal, you will receive your results only by: MyChart Message (if you have MyChart) OR A paper copy in the mail If you have any lab test that is abnormal or we need to change your treatment, we will call you to review the results.   Testing/Procedures:  None ordered.   Follow-Up: At [facility], you and your health needs are our priority.  As part of our continuing mission to provide you with exceptional heart care, we have created designated Provider Care Teams.  These Care Teams include your primary Cardiologist (physician) and Advanced Practice Providers (APPs -  Physician Assistants and Nurse Practitioners) who all work together to provide you with the care you need, when you need it.  We recommend signing up for the patient portal called "MyChart".  Sign up information is provided on this After Visit Summary.  MyChart is used to connect with patients for Virtual Visits (Telemedicine).  Patients are able to view lab/test results, encounter notes, upcoming appointments, etc.  Non-urgent messages can be sent to your provider as well.   To learn more about what you can do with MyChart, go to [email or web address].    Your next appointment:   1 month(s)  Provider:   Perlie Gold, PA-C     Then, Dietrich Pates, MD will plan to see you again in 1 month(s).    Other Instructions   [facility]:

## 2023-07-27 NOTE — Progress Notes (Signed)
Cardiology Office Note:   Date:  07/27/2023  ID:  Melanie Reeves, DOB 10-11-1951, MRN [id number] PCP: Eartha Inch, MD  [facility] Providers Cardiologist:  Dietrich Pates, MD    History of Present Illness:   Discussed the use of AI scribe software for clinical note transcription with the patient, who gave verbal consent to proceed.  History of Present Illness   The patient is a 72 year old individual with a history of paroxysmal atrial fibrillation, hyperlipidemia, hypertension, chronic diastolic heart failure, aortic atherosclerosis, and chronic kidney disease stage three. The patient was last seen in September, at which time she had gained about 11 pounds due to dietary indiscretion.  Since the last visit, the patient has been managing leg swelling with compression socks and leg wrapping by an orthopedics. The patient reports that the swelling improves with leg elevation and worsens when this is not possible, such as during [place] the [place]. The patient reports a knot in her left foot that has been treated for suspected cellulitis. The patient believes the swelling has improved and denies any significant discomfort.  The patient reports no significant changes in her overall feeling of health since the last visit. She denies any worsening shortness of breath but does note occasional fleeting feelings of breathlessness. The patient sleeps in a recliner and has not had to elevate it more due to shortness of breath. The patient's weight has been relatively stable, with a slight increase noted. The patient admits to dietary indiscretions, including frequent consumption of salty foods like sausage and popcorn.  The patient has a history of intermittent atrial fibrillation but reports no recent symptoms suggestive of a recurrence. The patient does not regularly monitor her blood pressure at home, and recent readings are unknown.      In summary, patient denies chest pain, worsening  shortness of breath, fatigue, palpitations, melena, hematuria, hemoptysis, diaphoresis, weakness, presyncope, syncope, orthopnea, and PND. She reports fluctuant but generally stable LE edema.  Studies Reviewed:    01/26/23 TTE  IMPRESSIONS     1. Left ventricular ejection fraction, by estimation, is 55 to 60%. Left  ventricular ejection fraction by 3D volume is 58 %. The left ventricle has  normal function. The left ventricle has no regional wall motion  abnormalities. Left ventricular diastolic   parameters are consistent with Grade I diastolic dysfunction (impaired  relaxation).   2. Right ventricular systolic function is normal. The right ventricular  size is moderately enlarged. There is normal pulmonary artery systolic  pressure. The estimated right ventricular systolic pressure is 35.7 mmHg.   3. The mitral valve is normal in structure. Trivial mitral valve  regurgitation. No evidence of mitral stenosis.   4. The aortic valve is normal in structure. Aortic valve regurgitation is  not visualized. No aortic stenosis is present.   5. The inferior vena cava is normal in size with greater than 50%  respiratory variability, suggesting right atrial pressure of 3 mmHg.   Comparison(s): No significant change from prior study. Prior images  reviewed side by side.   FINDINGS   Left Ventricle: Left ventricular ejection fraction, by estimation, is 55  to 60%. Left ventricular ejection fraction by 3D volume is 58 %. The left  ventricle has normal function. The left ventricle has no regional wall  motion abnormalities. Definity  contrast agent was given IV to delineate the left ventricular endocardial  borders. The left ventricular internal cavity size was normal in size.  There is no left  ventricular hypertrophy. Left ventricular diastolic  parameters are consistent with Grade I  diastolic dysfunction (impaired relaxation).   Right Ventricle: The right ventricular size is moderately  enlarged. No  increase in right ventricular wall thickness. Right ventricular systolic  function is normal. There is normal pulmonary artery systolic pressure.  The tricuspid regurgitant velocity is  2.86 m/s, and with an assumed right atrial pressure of 3 mmHg, the  estimated right ventricular systolic pressure is 35.7 mmHg.   Left Atrium: Left atrial size was normal in size.   Right Atrium: Right atrial size was normal in size.   Pericardium: There is no evidence of pericardial effusion.   Mitral Valve: The mitral valve is normal in structure. Trivial mitral  valve regurgitation. No evidence of mitral valve stenosis.   Tricuspid Valve: The tricuspid valve is normal in structure. Tricuspid  valve regurgitation is trivial. No evidence of tricuspid stenosis.   Aortic Valve: The aortic valve is normal in structure. Aortic valve  regurgitation is not visualized. No aortic stenosis is present. Aortic  valve mean gradient measures 8.5 mmHg. Aortic valve peak gradient measures  15.5 mmHg. Aortic valve area, by VTI  measures 2.32 cm.   Pulmonic Valve: The pulmonic valve was normal in structure. Pulmonic valve  regurgitation is not visualized. No evidence of pulmonic stenosis.   Aorta: The aortic root is normal in size and structure.   Venous: The inferior vena cava is normal in size with greater than 50%  respiratory variability, suggesting right atrial pressure of 3 mmHg.   IAS/Shunts: No atrial level shunt detected by color flow Doppler.   05/21/21 LE venous reflux  Summary:  Left:  - No evidence of deep vein thrombosis seen in the left lower extremity,  from the common femoral through the popliteal veins.  - No evidence of superficial venous reflux seen in the left greater  saphenous vein.  - No evidence of superficial venous reflux seen in the left short  saphenous vein.  - Venous reflux is noted in the left common femoral vein.  - Venous reflux is noted in the left  sapheno-femoral junction.    Risk Assessment/Calculations:    CHA2DS2-VASc Score = 5   This indicates a 7.2% annual risk of stroke. The patient's score is based upon: CHF History: 1 HTN History: 1 Diabetes History: 1 Stroke History: 0 Vascular Disease History: 0 Age Score: 1 Gender Score: 1             Physical Exam:   VS:  BP 120/70   Pulse 80   Ht 5\' 1"  (1.549 m)   Wt 271 lb 12.8 oz (123.3 kg)   SpO2 96%   BMI 51.36 kg/m    Wt Readings from Last 3 Encounters:  07/27/23 271 lb 12.8 oz (123.3 kg)  07/03/23 273 lb (123.8 kg)  05/26/23 260 lb (117.9 kg)     Physical Exam Vitals reviewed.  Constitutional:      Appearance: Normal appearance.  HENT:     Head: Normocephalic.     Nose: Nose normal.  Eyes:     Pupils: Pupils are equal, round, and reactive to light.  Cardiovascular:     Rate and Rhythm: Normal rate and regular rhythm.     Pulses: Normal pulses.     Heart sounds: Normal heart sounds. No murmur heard.    No friction rub. No gallop.  Pulmonary:     Effort: Pulmonary effort is normal.     Breath sounds: Normal  breath sounds.  Abdominal:     General: Abdomen is flat.  Musculoskeletal:     Right lower leg: Edema (trace-1+ to ankle) present.     Left lower leg: Edema (trace-1+ to ankle) present.     Comments: Chronic appearing redness 2/2 venous stasis  Skin:    General: Skin is warm and dry.     Capillary Refill: Capillary refill takes less than 2 seconds.  Neurological:     General: No focal deficit present.     Mental Status: She is alert and oriented to person, place, and time.  Psychiatric:        Mood and Affect: Mood normal.        Behavior: Behavior normal.        Thought Content: Thought content normal.        Judgment: Judgment normal.     ASSESSMENT AND PLAN:     Assessment and Plan    Chronic Diastolic Heart Failure LVEF 55-60% on April 2024 TTE. Stable with no worsening shortness of breath, NYHA class I-II symptoms. Swelling  in legs improved with compression socks and leg wrapping. Currently on Lasix 40mg  twice daily, Atenolol 25mg  daily, Canaglifolozin, and Losartan 25mg  daily. -Continue current medications. -Add Spironolactone 12.5mg  daily to assist with fluid management and potassium levels. -Check metabolic panel on 07/31/2023 and again 1-2 weeks later to monitor kidney function and potassium levels. -Recent weight gain and consumption of salty foods contributing to fluid retention. -Encourage reduction in consumption of salty foods. -Monitor weight for signs of fluid retention.  Atrial Fibrillation No recent symptoms of palpitations or skipped heartbeats. Currently on Xarelto and Atenolol. -Continue current medications. -Monitor for symptoms.  Hypertension Blood pressure initially elevated but normalized on repeat check. Currently on Atenolol 25mg  daily and Losartan 25mg  daily. -Continue Losartan and Atenolol -Add Spironolactone 12.5mg  -Refill Losartan prescription.  Venous Stasis Leg swelling improved with compression socks and leg wrapping. No current signs of significant fluid retention. -Continue current management. -Consider repeat venous ultrasound if symptoms worsen.  Aortic atherosclerosis Continue statin and BP control  Hyperlipidemia LDL at goal. Continue Pravastatin and Fenofibrate. Consider repeat lipid panel at next visit with elevated triglycerides. Lab Results  Component Value Date   CHOL 147 05/26/2023   HDL 42 05/26/2023   LDLCALC 50 05/26/2023   LDLDIRECT 72.0 02/28/2020   TRIG 366 (H) 05/26/2023   CHOLHDL 3 07/15/2021     Follow-up in 1 month.         Signed, Perlie Gold, PA-C

## 2023-07-31 ENCOUNTER — Ambulatory Visit: Payer: PPO | Attending: Cardiology

## 2023-07-31 DIAGNOSIS — E785 Hyperlipidemia, unspecified: Secondary | ICD-10-CM

## 2023-07-31 DIAGNOSIS — I700 Atherosclerosis of aorta: Secondary | ICD-10-CM

## 2023-07-31 DIAGNOSIS — I480 Paroxysmal atrial fibrillation: Secondary | ICD-10-CM

## 2023-07-31 DIAGNOSIS — I10 Essential (primary) hypertension: Secondary | ICD-10-CM

## 2023-07-31 DIAGNOSIS — Z79899 Other long term (current) drug therapy: Secondary | ICD-10-CM

## 2023-07-31 DIAGNOSIS — I5032 Chronic diastolic (congestive) heart failure: Secondary | ICD-10-CM

## 2023-08-01 LAB — BASIC METABOLIC PANEL
BUN/Creatinine Ratio: 36 — ABNORMAL HIGH (ref 12–28)
BUN: 38 mg/dL — ABNORMAL HIGH (ref 8–27)
CO2: 26 mmol/L (ref 20–29)
Calcium: 10.2 mg/dL (ref 8.7–10.3)
Chloride: 100 mmol/L (ref 96–106)
Creatinine, Ser: 1.06 mg/dL — ABNORMAL HIGH (ref 0.57–1.00)
Glucose: 180 mg/dL — ABNORMAL HIGH (ref 70–99)
Potassium: 4.4 mmol/L (ref 3.5–5.2)
Sodium: 140 mmol/L (ref 134–144)
eGFR: 57 mL/min/{1.73_m2} — ABNORMAL LOW (ref >59)

## 2023-08-03 ENCOUNTER — Telehealth: Payer: Self-pay

## 2023-08-03 ENCOUNTER — Telehealth: Payer: Self-pay | Admitting: *Deleted

## 2023-08-03 DIAGNOSIS — E785 Hyperlipidemia, unspecified: Secondary | ICD-10-CM

## 2023-08-03 DIAGNOSIS — I10 Essential (primary) hypertension: Secondary | ICD-10-CM

## 2023-08-03 DIAGNOSIS — I5032 Chronic diastolic (congestive) heart failure: Secondary | ICD-10-CM

## 2023-08-03 NOTE — Telephone Encounter (Signed)
The patient has been notified of the result and verbalized understanding.  All questions (if any) were answered. Frutoso Schatz, RN 08/03/2023 1:48 PM  Labs have been ordered.

## 2023-08-03 NOTE — Telephone Encounter (Signed)
-----   Message from Perlie Gold sent at 08/02/2023  6:00 PM EDT ----- Potassium and kidney function both stable after adding spironolactone. We'll plan to repeat a BMP once more in 2 weeks, again to follow potassium and kidney function.

## 2023-08-03 NOTE — Telephone Encounter (Signed)
Pre-operative Risk Assessment    Patient Name: Melanie Reeves  DOB: 1951/06/26 MRN: [id number]    DATE OF LAST VISIT: 07/27/23 Perlie Gold, PAC  DATE OF NEXT VISIT: 09/07/23 Perlie Gold, PAC-1 MONTH F/U; DR. Tenny Craw 09/22/23  Request for Surgical Clearance    Procedure:   HEMORRHOIDECTOMY   Date of Surgery:  Clearance TBD                                 Surgeon:  DR. Darlys Gales Surgeon's Group or Practice Name:  [facility] Phone number:  [phone] Fax number:  [phone] ATTN: Rushie Goltz, CMA   Type of Clearance Requested:   - Medical  - Pharmacy:  Hold Rivaroxaban (Xarelto)     Type of Anesthesia:  Not Indicated   Additional requests/questions:    Elpidio Anis   08/03/2023, 4:57 PM

## 2023-08-03 NOTE — Addendum Note (Signed)
Addended by: Frutoso Schatz on: 08/03/2023 01:50 PM   Modules accepted: Orders

## 2023-08-04 NOTE — Telephone Encounter (Signed)
Patient with diagnosis of afib on Xarelto for anticoagulation.    Procedure: hemorrhoidectomy Date of procedure: TBD  CHA2DS2-VASc Score = 6  This indicates a 9.7% annual risk of stroke. The patient's score is based upon: CHF History: 1 HTN History: 1 Diabetes History: 1 Stroke History: 0 Vascular Disease History: 1 Age Score: 1 Gender Score: 1  CrCl 70mL/mini using adjusted body weight due to obesity Platelet count 256K  Per office protocol, patient can hold Xarelto for 1-2 days prior to procedure.    **This guidance is not considered finalized until pre-operative APP has relayed final recommendations.**

## 2023-08-04 NOTE — Telephone Encounter (Signed)
I will update all parties involved per pre op APP defer clearance to appt with Perlie Gold, PAC.

## 2023-08-04 NOTE — Telephone Encounter (Signed)
Preoperative team, patient has upcoming appointment with cardiology on 09/07/2023.  Recommendations have been made by pharmacy to hold Xarelto 1-2 days prior to procedure.  Please add preoperative cardiac evaluation to appointment notes.  Thank you for your help.  Thomasene Ripple. Windel Keziah NP-C     08/04/2023, 10:51 AM [facility] [address] Office [phone] Fax [phone]

## 2023-08-05 ENCOUNTER — Encounter (INDEPENDENT_AMBULATORY_CARE_PROVIDER_SITE_OTHER): Payer: Self-pay | Admitting: Family Medicine

## 2023-08-05 ENCOUNTER — Ambulatory Visit (INDEPENDENT_AMBULATORY_CARE_PROVIDER_SITE_OTHER): Payer: PPO | Admitting: Family Medicine

## 2023-08-05 VITALS — BP 129/74 | HR 84 | Temp 97.6°F | Ht 61.0 in | Wt 266.0 lb

## 2023-08-05 DIAGNOSIS — E559 Vitamin D deficiency, unspecified: Secondary | ICD-10-CM | POA: Diagnosis not present

## 2023-08-05 DIAGNOSIS — E669 Obesity, unspecified: Secondary | ICD-10-CM | POA: Diagnosis not present

## 2023-08-05 DIAGNOSIS — Z6841 Body Mass Index (BMI) 40.0 and over, adult: Secondary | ICD-10-CM

## 2023-08-05 DIAGNOSIS — Z7985 Long-term (current) use of injectable non-insulin antidiabetic drugs: Secondary | ICD-10-CM

## 2023-08-05 DIAGNOSIS — E1165 Type 2 diabetes mellitus with hyperglycemia: Secondary | ICD-10-CM | POA: Diagnosis not present

## 2023-08-05 DIAGNOSIS — Z794 Long term (current) use of insulin: Secondary | ICD-10-CM

## 2023-08-05 MED ORDER — TIRZEPATIDE 15 MG/0.5ML ~~LOC~~ SOAJ: 15.0000 mg | SUBCUTANEOUS | 0 refills | Status: DC

## 2023-08-05 MED ORDER — VITAMIN D (ERGOCALCIFEROL) 1.25 MG (50000 UNIT) PO CAPS: 50000.0000 [IU] | ORAL_CAPSULE | ORAL | 0 refills | Status: DC

## 2023-08-05 NOTE — Progress Notes (Signed)
.smr  Office: [phone]  /  Fax: [phone]  WEIGHT SUMMARY AND BIOMETRICS  Anthropometric Measurements Height: 5\' 1"  (1.549 m) Weight: 266 lb (120.7 kg) BMI (Calculated): 50.29 Weight at Last Visit: 260 lb Weight Lost Since Last Visit: 0 Weight Gained Since Last Visit: 6 lb Starting Weight: 294 lb Total Weight Loss (lbs): 28 lb (12.7 kg)   Body Composition  Body Fat %: 57.3 % Fat Mass (lbs): 152.6 lbs Muscle Mass (lbs): 108 lbs Visceral Fat Rating : 23   Other Clinical Data Fasting: no Labs: no Today's Visit #: 41 Starting Date: 11/29/20    Chief Complaint: OBESITY   History of Present Illness   The patient, diagnosed with type two diabetes, obesity, and vitamin D deficiency, presents for a routine follow-up. She reports a recent weight gain of six pounds over the last nine weeks. She attributes this to a lack of structured eating plan and absence of regular exercise. She has been attempting portion control and making smarter food choices, but admits to indulging in high-sodium and high-calorie foods, particularly potatoes. She also reports a tendency to eat out frequently, which she acknowledges may contribute to fluid retention.  Despite these challenges, the patient has made some positive dietary changes, such as reducing salt intake and limiting consumption of sweets. She has also developed strategies to avoid temptation, such as sending her spouse to do the grocery shopping.  The patient is currently on Mounjaro and a weekly dose of 50,000 international units of vitamin D to manage her diabetes and vitamin D deficiency, respectively. She reports missing a week of Mounjaro recently, which resulted in a day of feeling full and eating less than usual.  In addition to her struggles with diet and weight, the patient also reports issues with fluid retention in her legs. She has been advised to have her legs wrapped to manage this issue.  The patient expresses a desire  to maintain her current weight and improve her glucose control. She acknowledges that her health conditions require lifelong management and expresses a commitment to making sustainable lifestyle changes.          PHYSICAL EXAM:  Blood pressure 129/74, pulse 84, temperature 97.6 F (36.4 C), height 5\' 1"  (1.549 m), weight 266 lb (120.7 kg), SpO2 92%. Body mass index is 50.26 kg/m.  DIAGNOSTIC DATA REVIEWED:  BMET    Component Value Date/Time   NA 140 07/31/2023 1140   K 4.4 07/31/2023 1140   CL 100 07/31/2023 1140   CO2 26 07/31/2023 1140   GLUCOSE 180 (H) 07/31/2023 1140   GLUCOSE 122 (H) 11/18/2022 1545   BUN 38 (H) 07/31/2023 1140   CREATININE 1.06 (H) 07/31/2023 1140   CALCIUM 10.2 07/31/2023 1140   GFRNONAA >60 11/18/2022 1545   GFRAA >60 10/02/2016 1300   Lab Results  Component Value Date   HGBA1C 6.7 (H) 05/26/2023   HGBA1C 11.4 (A) 07/02/2015   Lab Results  Component Value Date   INSULIN 31.7 (H) 05/26/2023   INSULIN 34.8 (H) 10/16/2021   Lab Results  Component Value Date   TSH 1.217 11/24/2020   CBC    Component Value Date/Time   WBC 11.7 (H) 11/18/2022 1552   RBC 5.04 11/18/2022 1552   HGB 13.6 11/18/2022 1552   HGB 12.9 06/24/2022 1153   HCT 42.9 11/18/2022 1552   HCT 40.5 06/24/2022 1153   PLT 256 11/18/2022 1552   PLT 245 06/24/2022 1153   MCV 85.1 11/18/2022 1552   MCV  84 06/24/2022 1153   MCH 27.0 11/18/2022 1552   MCHC 31.7 11/18/2022 1552   RDW 15.7 (H) 11/18/2022 1552   RDW 14.7 06/24/2022 1153   Iron Studies    Component Value Date/Time   FERRITIN 264 10/25/2020 0634   Lipid Panel     Component Value Date/Time   CHOL 147 05/26/2023 1224   TRIG 366 (H) 05/26/2023 1224   HDL 42 05/26/2023 1224   CHOLHDL 3 07/15/2021 1054   VLDL 30.6 07/15/2021 1054   LDLCALC 50 05/26/2023 1224   LDLDIRECT 72.0 02/28/2020 1124   Hepatic Function Panel     Component Value Date/Time   PROT 7.8 05/26/2023 1224   ALBUMIN 4.7 05/26/2023 1224    AST 21 05/26/2023 1224   ALT 18 05/26/2023 1224   ALKPHOS 62 05/26/2023 1224   BILITOT 0.3 05/26/2023 1224      Component Value Date/Time   TSH 1.217 11/24/2020 0352   TSH 1.420 08/24/2017 1608   Nutritional Lab Results  Component Value Date   VD25OH 57.0 05/26/2023   VD25OH 43.1 10/16/2021   VD25OH 36.2 06/05/2021     Assessment and Plan    Type 2 Diabetes Mellitus Patient is on Mounjaro and is working on diet and exercise for glucose control. Recent inconsistent use of Mounjaro reported. -Continue Mounjaro and monitor blood glucose levels. -Encourage consistent use of Mounjaro. -Refill Mounjaro for 90 days.  Obesity Patient has gained six pounds over the last nine weeks. She is not following a structured eating plan or exercising. She is working on Optician, dispensing. -Encourage continuation of portion control and smart choices. -Encourage initiation of regular exercise.  Vitamin D Deficiency Patient is on Vitamin D 50,000 international units weekly. -Refill Vitamin D for 30 days.  Follow-up Plan to check Vitamin D levels at next visit before Christmas. Patient should fast for this lab work.        She was informed of the importance of frequent follow up visits to maximize her success with intensive lifestyle modifications for her multiple health conditions.    Quillian Quince, MD

## 2023-08-06 ENCOUNTER — Ambulatory Visit: Payer: PPO | Admitting: Orthopedic Surgery

## 2023-08-06 ENCOUNTER — Ambulatory Visit (INDEPENDENT_AMBULATORY_CARE_PROVIDER_SITE_OTHER): Payer: PPO | Admitting: Family Medicine

## 2023-08-06 DIAGNOSIS — I83002 Varicose veins of unspecified lower extremity with ulcer of calf: Secondary | ICD-10-CM

## 2023-08-06 DIAGNOSIS — L97201 Non-pressure chronic ulcer of unspecified calf limited to breakdown of skin: Secondary | ICD-10-CM | POA: Diagnosis not present

## 2023-08-07 ENCOUNTER — Encounter: Payer: Self-pay | Admitting: Orthopedic Surgery

## 2023-08-11 ENCOUNTER — Encounter: Payer: Self-pay | Admitting: Orthopedic Surgery

## 2023-08-11 NOTE — Progress Notes (Signed)
Office Visit Note   Patient: Melanie Reeves           Date of Birth: January 28, 1951           MRN: [id number] Visit Date: 08/06/2023              Requested by: Eartha Inch, MD [address] [place],  [place] [address] PCP: Eartha Inch, MD  Chief Complaint  Patient presents with   Left Leg - Follow-up   Right Leg - Follow-up      HPI: Patient is a 72 year old woman who presents with venous stasis swelling of both lower extremities.  Assessment & Plan: Visit Diagnoses:  1. 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  Patient will follow-up with [facility] for compression stockings.  Patient states that she will remove the stockings on her own in a week.  Follow-Up Instructions: Return in about 1 week (around 08/13/2023).   Ortho Exam  Patient is alert, oriented, no adenopathy, well-dressed, normal affect, normal respiratory effort. Examination patient is a good dorsalis pedis pulse bilaterally she has brawny edema with clear weeping drainage in both legs there is no cellulitis.  The right calf is 50 cm in circumference the left calf is 48 cm in circumference.  Imaging: No results found. No images are attached to the encounter.  Labs: Lab Results  Component Value Date   HGBA1C 6.7 (H) 05/26/2023   HGBA1C 7.2 (H) 03/11/2022   HGBA1C 6.9 (H) 11/26/2021   ESRSEDRATE 75 (H) 03/30/2021   ESRSEDRATE 85 (H) 03/27/2021   CRP 5.3 (H) 04/03/2021   CRP 6.3 (H) 03/30/2021   CRP 9.3 (H) 03/27/2021   REPTSTATUS 03/31/2021 FINAL 03/26/2021   CULT  03/26/2021    NO GROWTH 5 DAYS Performed at [facility], [address] [place], [place] [address]      Lab Results  Component Value Date   ALBUMIN 4.7 05/26/2023   ALBUMIN 4.7 10/16/2021   ALBUMIN 4.3 07/15/2021    Lab Results  Component Value Date   MG  2.0 11/18/2022   MG 1.8 04/05/2021   MG 1.8 04/03/2021   Lab Results  Component Value Date   VD25OH 57.0 05/26/2023   VD25OH 43.1 10/16/2021   VD25OH 36.2 06/05/2021    No results found for: "PREALBUMIN"    Latest Ref Rng & Units 11/18/2022    3:52 PM 06/24/2022   11:53 AM 08/23/2021    4:48 PM  CBC EXTENDED  WBC 4.0 - 10.5 K/uL 11.7  11.3  13.9   RBC 3.87 - 5.11 MIL/uL 5.04  4.83  4.82   Hemoglobin 12.0 - 15.0 g/dL 84.6  96.2  95.2   HCT 36.0 - 46.0 % 42.9  40.5  41.4   Platelets 150 - 400 K/uL 256  245  295   NEUT# 1.7 - 7.7 K/uL 7.8     Lymph# 0.7 - 4.0 K/uL 3.0        There is no height or weight on file to calculate BMI.  Orders:  No orders of the defined types were placed in this encounter.  No orders of the defined types were placed in this encounter.    Procedures: No procedures performed  Clinical Data: No additional findings.  ROS:  All other systems negative, except as noted in the HPI. Review  of Systems  Objective: Vital Signs: There were no vitals taken for this visit.  Specialty Comments:  No specialty comments available.  PMFS History: Patient Active Problem List   Diagnosis Date Noted   Obesity, Beginning BMI 55.55 01/06/2023   BMI 45.0-49.9, adult (HCC) 11/12/2022   Obesity, Beginning BMI 55.55 11/12/2022   Dehydration 09/25/2022   Depression 03/25/2022   Cellulitis of left lower extremity 03/27/2021   Left foot pain 03/27/2021   Chronic diastolic CHF (congestive heart failure) (HCC) 03/27/2021   Cellulitis 03/26/2021   Other fatigue 11/29/2020   Screening for depression 11/29/2020   Vitamin D deficiency 11/29/2020   B12 deficiency 11/29/2020   Shortness of breath on exertion 11/24/2020   Acute respiratory failure with hypoxia (HCC) 11/24/2020   Obesity, Class III, BMI 40-49.9 (morbid obesity) (HCC) 10/24/2020   Acute respiratory failure due to COVID-19 (HCC) 10/24/2020   Uncontrolled type 2 diabetes mellitus with hyperglycemia,  with long-term current use of insulin (HCC) 10/24/2020   COVID-19 10/23/2020   Paroxysmal atrial fibrillation (HCC) 01/14/2019   Hyperlipidemia 01/14/2019   Diabetes mellitus (HCC) 01/14/2019   OSA (obstructive sleep apnea) 08/16/2014   Abnormal electrocardiogram 08/14/2014   Essential hypertension 08/14/2014   Dyspnea 08/14/2014   Class 3 severe obesity with serious comorbidity and body mass index (BMI) of 50.0 to 59.9 in adult [facility]) 08/14/2014   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   SPINAL STENOSIS 12/20/2009   LOW BACK PAIN 12/20/2009   Past Medical History:  Diagnosis Date   Abnormal electrocardiogram 08/14/2014   LBBB   Anemia    Anxiety    Arthritis    Atrial fibrillation (HCC)    Bilateral swelling of feet    Chest pain    Constipation    Depression    Diabetes mellitus (HCC)    Difficulty walking    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   Qualifier: Diagnosis of  By: Romeo Apple MD, Stanley     Dyspnea 08/14/2014   Edema    Gallbladder problem    GERD (gastroesophageal reflux disease)    Hyperlipidemia    Hypertension    LOW BACK PAIN 12/20/2009   Qualifier: Diagnosis of  By: Romeo Apple MD, Stanley     Morbid obesity (HCC)    OSA (obstructive sleep apnea) 08/16/2014   Osteoarthritis    Other fatigue    Palpitations    Seasonal allergies    Shortness of breath    Shortness of breath on exertion    Sinusitis    SPINAL STENOSIS 12/20/2009   Qualifier: Diagnosis of  By: Romeo Apple MD, Duffy Rhody     Stage 3 chronic kidney disease (HCC)     Family History  Problem Relation Age of Onset   Heart disease Father        MI   Cancer - Lung Father        Small cell Lung CA   Diabetes Father    Hypertension Father    Hyperlipidemia Father    Cancer Father    Depression Father    Anxiety disorder Father    Hypertension Mother    Hyperlipidemia Mother    Stroke Mother    Kidney disease Mother    Depression Mother    Anxiety disorder Mother    Bipolar disorder Mother    Eating disorder  Mother    Obesity Mother     Past Surgical History:  Procedure Laterality Date   CHOLECYSTECTOMY     COLONOSCOPY WITH PROPOFOL N/A 11/29/2014   Procedure: COLONOSCOPY  WITH PROPOFOL;  Surgeon: Willis Modena, MD;  Location: [facility];  Service: Endoscopy;  Laterality: N/A;   SKIN CANCER EXCISION     TONSILLECTOMY     Social History   Occupational History   Occupation: Retired    Comment: Therapist, sports  Tobacco Use   Smoking status: Former    Current packs/day: 0.00    Average packs/day: 2.0 packs/day for 20.0 years (40.0 ttl pk-yrs)    Types: Cigarettes    Start date: 11/22/1973    Quit date: 11/22/1993    Years since quitting: 29.7   Smokeless tobacco: Former  Building services engineer status: Never Used  Substance and Sexual Activity   Alcohol use: Yes    Comment: Rare   Drug use: No   Sexual activity: Not Currently    Partners: Male

## 2023-08-24 ENCOUNTER — Other Ambulatory Visit: Payer: Self-pay

## 2023-08-26 ENCOUNTER — Other Ambulatory Visit: Payer: Self-pay

## 2023-08-26 ENCOUNTER — Telehealth: Payer: Self-pay | Admitting: *Deleted

## 2023-08-26 DIAGNOSIS — I10 Essential (primary) hypertension: Secondary | ICD-10-CM

## 2023-08-26 LAB — BASIC METABOLIC PANEL
BUN/Creatinine Ratio: 38 — ABNORMAL HIGH (ref 12–28)
BUN: 52 mg/dL — ABNORMAL HIGH (ref 8–27)
CO2: 23 mmol/L (ref 20–29)
Calcium: 9.8 mg/dL (ref 8.7–10.3)
Chloride: 97 mmol/L (ref 96–106)
Creatinine, Ser: 1.36 mg/dL — ABNORMAL HIGH (ref 0.57–1.00)
Glucose: 154 mg/dL — ABNORMAL HIGH (ref 70–99)
Potassium: 4 mmol/L (ref 3.5–5.2)
Sodium: 138 mmol/L (ref 134–144)
eGFR: 42 mL/min/{1.73_m2} — ABNORMAL LOW (ref >59)

## 2023-08-26 MED ORDER — FUROSEMIDE 40 MG PO TABS: 40.0000 mg | ORAL_TABLET | Freq: Every day | ORAL | Status: DC

## 2023-08-26 NOTE — Progress Notes (Signed)
Order for 1 week BMP re-draw after changing medications placed. Pt aware.

## 2023-08-26 NOTE — Progress Notes (Signed)
Lasix order changed to 40 mg once daily per Perlie Gold, PA-C recommendations. Pt aware.

## 2023-08-26 NOTE — Telephone Encounter (Signed)
See previous notes pt needed to be added on this week per Perlie Gold, PAC. Ok per Joni Reining, DNP to add on this week. Tele pre op appt 08/28/23. Med rec and consent are done.

## 2023-08-26 NOTE — Telephone Encounter (Signed)
I will d/w the pre op APP where she wants the pt to be added.

## 2023-08-26 NOTE — Telephone Encounter (Signed)
See previous notes pt needed to be added on this week per Perlie Gold, PAC. Ok per Joni Reining, DNP to add on this week. Tele pre op appt 08/28/23. Med rec and consent are done.     Patient Consent for Virtual Visit        Melanie Reeves has provided verbal consent on 08/26/2023 for a virtual visit (video or telephone).   CONSENT FOR VIRTUAL VISIT FOR:  Melanie Reeves  By participating in this virtual visit I agree to the following:  I hereby voluntarily request, consent and authorize [facility] and its employed or contracted physicians, physician assistants, nurse practitioners or other licensed health care professionals (the Practitioner), to provide me with telemedicine health care services (the "Services") as deemed necessary by the treating Practitioner. I acknowledge and consent to receive the Services by the Practitioner via telemedicine. I understand that the telemedicine visit will involve communicating with the Practitioner through live audiovisual communication technology and the disclosure of certain medical information by electronic transmission. I acknowledge that I have been given the opportunity to request an in-person assessment or other available alternative prior to the telemedicine visit and am voluntarily participating in the telemedicine visit.  I understand that I have the right to withhold or withdraw my consent to the use of telemedicine in the course of my care at any time, without affecting my right to future care or treatment, and that the Practitioner or I may terminate the telemedicine visit at any time. I understand that I have the right to inspect all information obtained and/or recorded in the course of the telemedicine visit and may receive copies of available information for a reasonable fee.  I understand that some of the potential risks of receiving the Services via telemedicine include:  Delay or interruption in medical evaluation due to  technological equipment failure or disruption; Information transmitted may not be sufficient (e.g. poor resolution of images) to allow for appropriate medical decision making by the Practitioner; and/or  In rare instances, security protocols could fail, causing a breach of personal health information.  Furthermore, I acknowledge that it is my responsibility to provide information about my medical history, conditions and care that is complete and accurate to the best of my ability. I acknowledge that Practitioner's advice, recommendations, and/or decision may be based on factors not within their control, such as incomplete or inaccurate data provided by me or distortions of diagnostic images or specimens that may result from electronic transmissions. I understand that the practice of medicine is not an exact science and that Practitioner makes no warranties or guarantees regarding treatment outcomes. I acknowledge that a copy of this consent can be made available to me via my patient portal [facility] MyChart), or I can request a printed copy by calling the office of [facility].    I understand that my insurance will be billed for this visit.   I have read or had this consent read to me. I understand the contents of this consent, which adequately explains the benefits and risks of the Services being provided via telemedicine.  I have been provided ample opportunity to ask questions regarding this consent and the Services and have had my questions answered to my satisfaction. I give my informed consent for the services to be provided through the use of telemedicine in my medical care

## 2023-08-28 ENCOUNTER — Ambulatory Visit: Payer: PPO | Attending: Internal Medicine | Admitting: Emergency Medicine

## 2023-08-28 ENCOUNTER — Encounter: Payer: Self-pay | Admitting: Emergency Medicine

## 2023-08-28 DIAGNOSIS — Z01810 Encounter for preprocedural cardiovascular examination: Secondary | ICD-10-CM

## 2023-08-28 NOTE — Progress Notes (Signed)
Virtual Visit via Telephone Note   Because of Melanie Reeves's co-morbid illnesses, she is at least at moderate risk for complications without adequate follow up.  This format is felt to be most appropriate for this patient at this time.  The patient did not have access to video technology/had technical difficulties with video requiring transitioning to audio format only (telephone).  All issues noted in this document were discussed and addressed.  No physical exam could be performed with this format.  Please refer to the patient's chart for her consent to telehealth for [facility].  Evaluation Performed:  Preoperative cardiovascular risk assessment _____________   Date:  08/28/2023   Patient ID:  Melanie Reeves, DOB 08-25-1951, MRN [id number] Patient Location:  Home Provider location:   Office  Primary Care Provider:  Eartha Inch, MD Primary Cardiologist:  Dietrich Pates, MD  Chief Complaint / Patient Profile   72 y.o. y/o female with a h/o Chronic DHF, Afib, HTN, T2DM, LBBB, venous stasis, aortic atherosclerosis, HLD, CKD stage 3 who is pending hemorrhoidectomy on 09/01/2023 and presents today for telephonic preoperative cardiovascular risk assessment.  History of Present Illness    Melanie Reeves is a 72 y.o. female who presents via audio/video conferencing for a telehealth visit today.  Pt was last seen in cardiology clinic on 07/27/2023 by Perlie Gold, PA.  At that time Melanie Reeves was doing well, presented with bilateral LE edema and spironolactone 12.5mg  was started.  The patient is now pending procedure as outlined above. Since her last visit, she notes improvement in her symptoms and denies chest pain, shortness of breath, fatigue, palpitations, melena, hematuria, hemoptysis, diaphoresis, weakness, presyncope, syncope, orthopnea, and PND.  Past Medical History    Past Medical History:  Diagnosis Date   Abnormal electrocardiogram 08/14/2014   LBBB   Anemia     Anxiety    Arthritis    Atrial fibrillation (HCC)    Bilateral swelling of feet    Chest pain    Constipation    Depression    Diabetes mellitus (HCC)    Difficulty walking    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   Qualifier: Diagnosis of  By: Romeo Apple MD, Stanley     Dyspnea 08/14/2014   Edema    Gallbladder problem    GERD (gastroesophageal reflux disease)    Hyperlipidemia    Hypertension    LOW BACK PAIN 12/20/2009   Qualifier: Diagnosis of  By: Romeo Apple MD, Stanley     Morbid obesity (HCC)    OSA (obstructive sleep apnea) 08/16/2014   Osteoarthritis    Other fatigue    Palpitations    Seasonal allergies    Shortness of breath    Shortness of breath on exertion    Sinusitis    SPINAL STENOSIS 12/20/2009   Qualifier: Diagnosis of  By: Romeo Apple MD, Stanley     Stage 3 chronic kidney disease [facility]    Past Surgical History:  Procedure Laterality Date   CHOLECYSTECTOMY     COLONOSCOPY WITH PROPOFOL N/A 11/29/2014   Procedure: COLONOSCOPY WITH PROPOFOL;  Surgeon: Willis Modena, MD;  Location: [facility];  Service: Endoscopy;  Laterality: N/A;   SKIN CANCER EXCISION     TONSILLECTOMY      Allergies  Allergies  Allergen Reactions   Simvastatin Other (See Comments)    Other reaction(s): leg weakness Other reaction(s): leg weakness   Sulfa Antibiotics Other (See Comments)    Cant remember Other reaction(s): as a child  Home Medications    Prior to Admission medications   Medication Sig Start Date End Date Taking? Authorizing Provider  atenolol (TENORMIN) 25 MG tablet Take 1 tablet (25 mg total) by mouth daily. 10/26/20   Catarina Hartshorn, MD  B-D ULTRAFINE III SHORT PEN 31G X 8 MM MISC USE TO INJECT MEDICATION 5 TIMES DAILY. 06/13/21   Reather Littler, MD  Blood Glucose Monitoring Suppl (ONETOUCH VERIO) w/Device KIT UAD to monitor glucose tid; Dx: E11.65 07/09/18   Reather Littler, MD  Canagliflozin-metFORMIN HCl [phone] MG TABS Take 2 tablets by mouth daily with supper.    [provider]  cetirizine (ZYRTEC) 10 MG tablet Take 10 mg by mouth daily.  Take one tablet (10 mg dose) by mouth every morning. QAM    [provider]  Continuous Glucose Sensor (FREESTYLE LIBRE 3 SENSOR) MISC Place 1 sensor on the skin every 14 days. Use to check glucose continuously 06/17/23   [place], [place], FNP  diclofenac Sodium (VOLTAREN) 1 % GEL ) Topical 4 Times Daily PRN 02/27/23   [provider]  DULoxetine (CYMBALTA) 30 MG capsule TAKE 1 CAPSULE BY MOUTH DAILY. TAKE IN ADDITION TO THE 60 MG 09/30/22   Reather Littler, MD  DULoxetine (CYMBALTA) 60 MG capsule Take 60 mg by mouth daily. Taking in the AM    [provider]  fenofibrate 160 MG tablet Take 1 tablet (160 mg total) by mouth daily. 12/24/22   Sharlene Dory, PA-C  furosemide (LASIX) 40 MG tablet Take 1 tablet (40 mg total) by mouth daily. 08/26/23   Perlie Gold, PA-C  glucose blood (ONETOUCH ULTRA) test strip USE AS INSTRUCTED TO CHECK BLOOD SUGAR THREE TIMES DAILY. 05/07/21   Reather Littler, MD  HYDROcodone-acetaminophen (NORCO/VICODIN) 5-325 MG tablet Take 1 tablet by mouth every 6 (six) hours as needed for moderate pain.    [provider]  ibuprofen (ADVIL) 200 MG tablet Take 400 mg by mouth every 6 (six) hours as needed for fever.    [provider]  insulin glargine, 2 Unit Dial, (TOUJEO MAX SOLOSTAR) 300 UNIT/ML Solostar Pen Inject 80 Units into the skin daily.    [provider]  losartan (COZAAR) 25 MG tablet Take 1 tablet (25 mg total) by mouth daily. 07/27/23   Perlie Gold, PA-C  methocarbamol (ROBAXIN) 500 MG tablet Take by mouth.  Take one tablet (500 mg dose) by mouth 2 (two) times a day as needed (PRN MUSCLE SPASMS). 03/19/23   [provider]  nystatin (MYCOSTATIN/NYSTOP) powder Apply topically 2 (two) times daily.  Apply topically 2 (two) times daily. 03/17/23   [provider]  Omega-3 Fatty Acids (FISH OIL) 1000 MG CAPS Take 2,000 mg by mouth daily.     [provider]  pravastatin (PRAVACHOL) 40 MG tablet TAKE 1 TABLET BY MOUTH EVERY DAY 02/05/22   Reather Littler, MD  pregabalin (LYRICA) 100 MG capsule Take 1 capsule (100 mg total) by mouth 2 (two) times daily. 04/01/22   Pricilla Riffle, MD  spironolactone (ALDACTONE) 25 MG tablet Take 0.5 tablets (12.5 mg total) by mouth daily. 07/27/23   Perlie Gold, PA-C  tirzepatide [facility]) 15 MG/0.5ML Pen Inject 15 mg into the skin once a week. 08/05/23   Quillian Quince D, MD  traZODone (DESYREL) 100 MG tablet Take 100 mg by mouth at bedtime. 12/27/22   [provider]  Vitamin D, Ergocalciferol, (DRISDOL) 1.25 MG (50000 UNIT) CAPS capsule Take 1 capsule (50,000 Units total) by mouth  every 7 (seven) days. 08/05/23   Wilder Glade, MD  XARELTO 20 MG TABS tablet TAKE 1 TABLET BY MOUTH DAILY WITH SUPPER 03/18/23   Pricilla Riffle, MD    Physical Exam    Vital Signs:  Melanie Reeves does not have vital signs available for review today.  Given telephonic nature of communication, physical exam is limited. AAOx3. NAD. Normal affect.  Speech and respirations are unlabored.  Accessory Clinical Findings    None  Assessment & Plan    1.  Preoperative Cardiovascular Risk Assessment: According to the Revised Cardiac Risk Index (RCRI), her Perioperative Risk of Major Cardiac Event is (%): 6.6. Her Functional Capacity in METs is: 6.45 according to the Duke Activity Status Index (DASI). Therefore, based on ACC/AHA guidelines, patient would be at acceptable risk for the planned procedure without further cardiovascular testing.   The patient was advised that if she develops new symptoms prior to surgery to contact our office to arrange for a follow-up visit, and she verbalized understanding.  Per office protocol, patient can hold Xarelto for 1-2 days prior to procedure. Please resume Xarelto as soon as possible postprocedure, at the discretion of the surgeon.     A copy of this note will be  routed to requesting surgeon.  Time:   Today, I have spent 9 minutes with the patient with telehealth technology discussing medical history, symptoms, and management plan.     Denyce Robert, NP  08/28/2023, 11:07 AM

## 2023-09-02 ENCOUNTER — Other Ambulatory Visit (INDEPENDENT_AMBULATORY_CARE_PROVIDER_SITE_OTHER): Payer: Self-pay | Admitting: Family Medicine

## 2023-09-02 DIAGNOSIS — E559 Vitamin D deficiency, unspecified: Secondary | ICD-10-CM

## 2023-09-07 ENCOUNTER — Encounter (HOSPITAL_BASED_OUTPATIENT_CLINIC_OR_DEPARTMENT_OTHER): Payer: Self-pay

## 2023-09-07 ENCOUNTER — Other Ambulatory Visit: Payer: Self-pay

## 2023-09-07 ENCOUNTER — Encounter: Payer: Self-pay | Admitting: Cardiology

## 2023-09-07 ENCOUNTER — Ambulatory Visit: Payer: PPO | Attending: Cardiology | Admitting: Cardiology

## 2023-09-07 VITALS — BP 120/64 | HR 80 | Ht 60.0 in | Wt 266.0 lb

## 2023-09-07 DIAGNOSIS — E119 Type 2 diabetes mellitus without complications: Secondary | ICD-10-CM | POA: Diagnosis not present

## 2023-09-07 DIAGNOSIS — R944 Abnormal results of kidney function studies: Secondary | ICD-10-CM | POA: Diagnosis not present

## 2023-09-07 DIAGNOSIS — Z7984 Long term (current) use of oral hypoglycemic drugs: Secondary | ICD-10-CM | POA: Diagnosis not present

## 2023-09-07 DIAGNOSIS — D72829 Elevated white blood cell count, unspecified: Secondary | ICD-10-CM | POA: Insufficient documentation

## 2023-09-07 DIAGNOSIS — K625 Hemorrhage of anus and rectum: Secondary | ICD-10-CM | POA: Diagnosis present

## 2023-09-07 DIAGNOSIS — Z79899 Other long term (current) drug therapy: Secondary | ICD-10-CM | POA: Diagnosis not present

## 2023-09-07 DIAGNOSIS — E785 Hyperlipidemia, unspecified: Secondary | ICD-10-CM | POA: Diagnosis not present

## 2023-09-07 DIAGNOSIS — I10 Essential (primary) hypertension: Secondary | ICD-10-CM | POA: Insufficient documentation

## 2023-09-07 DIAGNOSIS — I5032 Chronic diastolic (congestive) heart failure: Secondary | ICD-10-CM | POA: Diagnosis not present

## 2023-09-07 DIAGNOSIS — I700 Atherosclerosis of aorta: Secondary | ICD-10-CM

## 2023-09-07 DIAGNOSIS — I480 Paroxysmal atrial fibrillation: Secondary | ICD-10-CM | POA: Insufficient documentation

## 2023-09-07 DIAGNOSIS — Z794 Long term (current) use of insulin: Secondary | ICD-10-CM | POA: Insufficient documentation

## 2023-09-07 DIAGNOSIS — N1831 Chronic kidney disease, stage 3a: Secondary | ICD-10-CM

## 2023-09-07 DIAGNOSIS — Z7901 Long term (current) use of anticoagulants: Secondary | ICD-10-CM | POA: Diagnosis not present

## 2023-09-07 LAB — CBC
HCT: 39.8 % (ref 36.0–46.0)
Hemoglobin: 12.7 g/dL (ref 12.0–15.0)
MCH: 27.5 pg (ref 26.0–34.0)
MCHC: 31.9 g/dL (ref 30.0–36.0)
MCV: 86.1 fL (ref 80.0–100.0)
Platelets: 343 10*3/uL (ref 150–400)
RBC: 4.62 MIL/uL (ref 3.87–5.11)
RDW: 16 % — ABNORMAL HIGH (ref 11.5–15.5)
WBC: 17.2 10*3/uL — ABNORMAL HIGH (ref 4.0–10.5)
nRBC: 0 % (ref 0.0–0.2)

## 2023-09-07 MED ORDER — FUROSEMIDE 40 MG PO TABS: 40.0000 mg | ORAL_TABLET | Freq: Every day | ORAL | 0 refills | Status: AC | PRN

## 2023-09-07 NOTE — Patient Instructions (Addendum)
Medication Instructions:  CHANGE Lasix to 40mg  Take as needed for leg swelling or weight gain of 2lbs in a day or 5lbs in a week  *If you need a refill on your cardiac medications before your next appointment, please call your pharmacy*   Lab Work: TODAY-BMET If you have labs (blood work) drawn today and your tests are completely normal, you will receive your results only by: MyChart Message (if you have MyChart) OR A paper copy in the mail If you have any lab test that is abnormal or we need to change your treatment, we will call you to review the results.   Testing/Procedures: NONE ORDERED   Follow-Up: At [facility], you and your health needs are our priority.  As part of our continuing mission to provide you with exceptional heart care, we have created designated Provider Care Teams.  These Care Teams include your primary Cardiologist (physician) and Advanced Practice Providers (APPs -  Physician Assistants and Nurse Practitioners) who all work together to provide you with the care you need, when you need it.  We recommend signing up for the patient portal called "MyChart".  Sign up information is provided on this After Visit Summary.  MyChart is used to connect with patients for Virtual Visits (Telemedicine).  Patients are able to view lab/test results, encounter notes, upcoming appointments, etc.  Non-urgent messages can be sent to your provider as well.   To learn more about what you can do with MyChart, go to [email or web address].    Your next appointment:   3 month(s)  Provider:   Dietrich Pates, MD  or Perlie Gold, PA-C   Other Instructions

## 2023-09-07 NOTE — Progress Notes (Signed)
Cardiology Office Note:   Date:  09/07/2023  ID:  Melanie Reeves, DOB Feb 01, 1951, MRN [id number] PCP: Eartha Inch, MD  [facility] Providers Cardiologist:  Dietrich Pates, MD    History of Present Illness:   Discussed the use of AI scribe software for clinical note transcription with the patient, who gave verbal consent to proceed.  History of Present Illness   The patient, a 72 year old with a complex medical history including paroxysmal atrial fibrillation, hyperlipidemia, hypertension, chronic diastolic heart failure, aortic atherosclerosis, and chronic kidney disease stage three, presented for a follow-up visit.   In September of this year, the patient had gained significant weight due to dietary indiscretion and was prescribed Lasix 40mg  three times a day for three days, then decreased to 40mg  twice daily indefinitely. The patient reported improvement in swelling after this regimen.  Spironolactone 12.5mg  was added to the patient's regimen when I saw her in follow up in October. Patient tolerated well initially but was later noted to have an increase in creatinine from 1.06 to 1.36, prompting a decrease in Lasix to 40mg  once a day. The patient is now 6 days s/p hemorrhoidectomy and reports that lasix has been held for at least the past 3 days due to very soft stools/diarrhea and need to avoid frequent urination. Despite holding lasix, patient has no LE edema on exam. She has made great efforts to improve her diet and avoid salty foods. The patient also reports still using compression socks and leg wraps intermittently.  The patient reported no recent episodes of shortness of breath or palpitations. Describes non-specific and rare "weakness" which she attributes to chronic sinus issues. The patient also reported a change in insurance, which will affect coverage of her current medications, including Xarelto (reportedly has about 3 months supply remaining).     Today patient denies  chest pain, shortness of breath, lower extremity edema, fatigue, palpitations, melena, hematuria, hemoptysis, diaphoresis, weakness, presyncope, syncope, orthopnea, and PND.   Studies Reviewed:    01/26/23 TTE   IMPRESSIONS     1. Left ventricular ejection fraction, by estimation, is 55 to 60%. Left  ventricular ejection fraction by 3D volume is 58 %. The left ventricle has  normal function. The left ventricle has no regional wall motion  abnormalities. Left ventricular diastolic   parameters are consistent with Grade I diastolic dysfunction (impaired  relaxation).   2. Right ventricular systolic function is normal. The right ventricular  size is moderately enlarged. There is normal pulmonary artery systolic  pressure. The estimated right ventricular systolic pressure is 35.7 mmHg.   3. The mitral valve is normal in structure. Trivial mitral valve  regurgitation. No evidence of mitral stenosis.   4. The aortic valve is normal in structure. Aortic valve regurgitation is  not visualized. No aortic stenosis is present.   5. The inferior vena cava is normal in size with greater than 50%  respiratory variability, suggesting right atrial pressure of 3 mmHg.   Comparison(s): No significant change from prior study. Prior images  reviewed side by side.   FINDINGS   Left Ventricle: Left ventricular ejection fraction, by estimation, is 55  to 60%. Left ventricular ejection fraction by 3D volume is 58 %. The left  ventricle has normal function. The left ventricle has no regional wall  motion abnormalities. Definity  contrast agent was given IV to delineate the left ventricular endocardial  borders. The left ventricular internal cavity size was normal in size.  There is no  left ventricular hypertrophy. Left ventricular diastolic  parameters are consistent with Grade I  diastolic dysfunction (impaired relaxation).   Right Ventricle: The right ventricular size is moderately enlarged. No   increase in right ventricular wall thickness. Right ventricular systolic  function is normal. There is normal pulmonary artery systolic pressure.  The tricuspid regurgitant velocity is  2.86 m/s, and with an assumed right atrial pressure of 3 mmHg, the  estimated right ventricular systolic pressure is 35.7 mmHg.   Left Atrium: Left atrial size was normal in size.   Right Atrium: Right atrial size was normal in size.   Pericardium: There is no evidence of pericardial effusion.   Mitral Valve: The mitral valve is normal in structure. Trivial mitral  valve regurgitation. No evidence of mitral valve stenosis.   Tricuspid Valve: The tricuspid valve is normal in structure. Tricuspid  valve regurgitation is trivial. No evidence of tricuspid stenosis.   Aortic Valve: The aortic valve is normal in structure. Aortic valve  regurgitation is not visualized. No aortic stenosis is present. Aortic  valve mean gradient measures 8.5 mmHg. Aortic valve peak gradient measures  15.5 mmHg. Aortic valve area, by VTI  measures 2.32 cm.   Pulmonic Valve: The pulmonic valve was normal in structure. Pulmonic valve  regurgitation is not visualized. No evidence of pulmonic stenosis.   Aorta: The aortic root is normal in size and structure.   Venous: The inferior vena cava is normal in size with greater than 50%  respiratory variability, suggesting right atrial pressure of 3 mmHg.   IAS/Shunts: No atrial level shunt detected by color flow Doppler.   Risk Assessment/Calculations:    CHA2DS2-VASc Score = 6   This indicates a 9.7% annual risk of stroke. The patient's score is based upon: CHF History: 1 HTN History: 1 Diabetes History: 1 Stroke History: 0 Vascular Disease History: 1 Age Score: 1 Gender Score: 1             Physical Exam:   VS:  BP 120/64   Pulse 80   Ht 5' (1.524 m)   Wt 266 lb (120.7 kg)   SpO2 96%   BMI 51.95 kg/m    Wt Readings from Last 3 Encounters:  09/07/23 266  lb (120.7 kg)  08/05/23 266 lb (120.7 kg)  07/27/23 271 lb 12.8 oz (123.3 kg)     Physical Exam Vitals reviewed.  Constitutional:      Appearance: Normal appearance.  HENT:     Head: Normocephalic.     Nose: Nose normal.  Eyes:     Pupils: Pupils are equal, round, and reactive to light.  Cardiovascular:     Rate and Rhythm: Normal rate and regular rhythm.     Pulses: Normal pulses.     Heart sounds: Normal heart sounds. No murmur heard.    No friction rub. No gallop.  Pulmonary:     Effort: Pulmonary effort is normal.     Breath sounds: Normal breath sounds.  Abdominal:     General: Abdomen is flat.  Musculoskeletal:     Right lower leg: No edema.     Left lower leg: No edema.  Skin:    General: Skin is warm and dry.     Capillary Refill: Capillary refill takes less than 2 seconds.  Neurological:     General: No focal deficit present.     Mental Status: She is alert and oriented to person, place, and time.  Psychiatric:  Mood and Affect: Mood normal.        Behavior: Behavior normal.        Thought Content: Thought content normal.        Judgment: Judgment normal.     ASSESSMENT AND PLAN:     Assessment and Plan    Chronic Diastolic Heart Failure LVEF 55-60% on April 2024 TTE. Stable with no worsening shortness of breath, NYHA class I-II symptoms. September LE edema resolved with higher dose Lasix. Lasix since decreased and now held s/p surgery. Swelling well-controlled despite recent holding of Lasix for at least three days.  - Decrease Lasix to 40mg  as needed for leg swelling or weight gain of 3-4 pounds - Continue spironolactone 12.5 mg daily - Recheck kidney function today  Paroxysmal Atrial Fibrillation Paroxysmal atrial fibrillation hx with no recent episodes. Apple Watch monitoring shows no recent AFib episodes. Discussed switching from Xarelto to Eliquis due to better safety profile for borderline kidney function. - Switch from Xarelto to Eliquis  after current supply of Xarelto is exhausted (approximately three months) or sooner if renal dysfunction progresses.  - Follow-up in three months  Chronic Kidney Disease Stage 3 Chronic kidney disease stage 3 with recent increase in creatinine from 1.06 to 1.36 after initiation of spironolactone. Kidney function to be rechecked today. Discussed importance of close monitoring with future lab work. - Recheck kidney function today - Monitor kidney function closely with future lab work  Hypertension Hypertension managed with losartan and atenolol. Blood pressure readings are well-managed. Discussed role of losartan and atenolol in managing blood pressure and arrhythmia. - Continue losartan 25mg  - Continue atenolol 25mg  - Continue Spironolactone 12.5mg   Hyperlipidemia Hyperlipidemia managed with statin and fenofibrate. LDL 50 in August, at goal. Triglycerides elevated  to 366 in August. Plan to repeat lipid panel in three months and consider adjusting triglyceride-lowering therapy based on results. - Repeat lipid panel in three months - Consider adjusting lipid-lowering therapy based on results (Vascepa?) - Continue Pravastatin 40mg , Fenofibrate 160mg .  Venous Stasis Leg swelling improved with compression socks and leg wrapping. No current signs of fluid retention. -Continue current management. -Consider repeat venous ultrasound if symptoms worsen.  Aortic atherosclerosis Continue statin and BP control  General Health Maintenance Avoiding salty foods and using compression socks intermittently. No recent palpitations or significant dyspnea. Discussed benefits of continued avoidance of salty foods and intermittent use of compression socks. - Encourage continued avoidance of salty foods - Encourage use of compression socks as needed  Follow-up - Schedule follow-up appointment in 3 months            Signed, Perlie Gold, PA-C

## 2023-09-07 NOTE — ED Triage Notes (Signed)
Pt had hemorrhoidectomy last week and has had diarrhea since. Pt states that diarrhea stopped today, but pt now has bleeding from the surgical site.

## 2023-09-08 ENCOUNTER — Emergency Department (HOSPITAL_BASED_OUTPATIENT_CLINIC_OR_DEPARTMENT_OTHER)
Admission: EM | Admit: 2023-09-08 | Discharge: 2023-09-08 | Payer: PPO | Attending: Emergency Medicine | Admitting: Emergency Medicine

## 2023-09-08 DIAGNOSIS — Z7901 Long term (current) use of anticoagulants: Secondary | ICD-10-CM

## 2023-09-08 DIAGNOSIS — K625 Hemorrhage of anus and rectum: Secondary | ICD-10-CM

## 2023-09-08 LAB — BASIC METABOLIC PANEL
BUN/Creatinine Ratio: 36 — ABNORMAL HIGH (ref 12–28)
BUN: 35 mg/dL — ABNORMAL HIGH (ref 8–27)
CO2: 19 mmol/L — ABNORMAL LOW (ref 20–29)
Calcium: 10.4 mg/dL — ABNORMAL HIGH (ref 8.7–10.3)
Chloride: 103 mmol/L (ref 96–106)
Creatinine, Ser: 0.97 mg/dL (ref 0.57–1.00)
Glucose: 151 mg/dL — ABNORMAL HIGH (ref 70–99)
Potassium: 4.3 mmol/L (ref 3.5–5.2)
Sodium: 140 mmol/L (ref 134–144)
eGFR: 63 mL/min/{1.73_m2} (ref >59)

## 2023-09-08 LAB — COMPREHENSIVE METABOLIC PANEL
ALT: 15 U/L (ref 0–44)
AST: 18 U/L (ref 15–41)
Albumin: 4.2 g/dL (ref 3.5–5.0)
Alkaline Phosphatase: 40 U/L (ref 38–126)
Anion gap: 12 (ref 5–15)
BUN: 32 mg/dL — ABNORMAL HIGH (ref 8–23)
CO2: 19 mmol/L — ABNORMAL LOW (ref 22–32)
Calcium: 10.1 mg/dL (ref 8.9–10.3)
Chloride: 105 mmol/L (ref 98–111)
Creatinine, Ser: 0.9 mg/dL (ref 0.44–1.00)
GFR, Estimated: >60 mL/min (ref >60)
Glucose, Bld: 145 mg/dL — ABNORMAL HIGH (ref 70–99)
Potassium: 4.1 mmol/L (ref 3.5–5.1)
Sodium: 136 mmol/L (ref 135–145)
Total Bilirubin: 0.3 mg/dL (ref <1.2)
Total Protein: 7.9 g/dL (ref 6.5–8.1)

## 2023-09-08 NOTE — ED Provider Notes (Signed)
[place] EMERGENCY DEPARTMENT AT [facility] Provider Note   CSN: [id number] Arrival date & time: 09/07/23  2319     History  Chief Complaint  Patient presents with   Post-op Problem   Rectal Bleeding    Melanie Reeves is a 72 y.o. female.  The history is provided by the patient.  Rectal Bleeding She has history of hypertension, diabetes, hyperlipidemia, paroxysmal atrial fibrillation anticoagulated on rivaroxaban, heart failure and is 1 week status post hemorrhoidectomy.  She was doing well until tonight when she started having bright red bleeding.  She states that she had been having watery to loose bowel movements up to this point but today they was more gelatinous.  She was feeling a little lightheaded in the waiting room.   Home Medications Prior to Admission medications   Medication Sig Start Date End Date Taking? Authorizing Provider  atenolol (TENORMIN) 25 MG tablet Take 1 tablet (25 mg total) by mouth daily. 10/26/20   Catarina Hartshorn, MD  B-D ULTRAFINE III SHORT PEN 31G X 8 MM MISC USE TO INJECT MEDICATION 5 TIMES DAILY. 06/13/21   Reather Littler, MD  Blood Glucose Monitoring Suppl (ONETOUCH VERIO) w/Device KIT UAD to monitor glucose tid; Dx: E11.65 07/09/18   Reather Littler, MD  Canagliflozin-metFORMIN HCl (INVOKAMET) [phone] MG TABS Take 2 tablets by mouth daily.    [provider]  Canagliflozin-metFORMIN HCl [phone] MG TABS Take 2 tablets by mouth daily with supper.    [provider]  cetirizine (ZYRTEC) 10 MG tablet Take 10 mg by mouth daily.  Take one tablet (10 mg dose) by mouth every morning. QAM    [provider]  Continuous Glucose Sensor (FREESTYLE LIBRE 3 SENSOR) MISC Place 1 sensor on the skin every 14 days. Use to check glucose continuously 06/17/23   [place], [place], FNP  diclofenac Sodium (VOLTAREN) 1 % GEL ) Topical 4 Times Daily PRN 02/27/23   [provider]  DULoxetine (CYMBALTA) 30 MG capsule TAKE 1 CAPSULE BY MOUTH  DAILY. TAKE IN ADDITION TO THE 60 MG 09/30/22   Reather Littler, MD  DULoxetine (CYMBALTA) 60 MG capsule Take 60 mg by mouth daily. Taking in the AM    [provider]  fenofibrate 160 MG tablet Take 1 tablet (160 mg total) by mouth daily. 12/24/22   Sharlene Dory, PA-C  furosemide (LASIX) 40 MG tablet Take 1 tablet (40 mg total) by mouth daily as needed (leg swelling or weight gain of 2 lbs in a day or 5lbs in a week). 09/07/23   Perlie Gold, PA-C  glucose blood (ONETOUCH ULTRA) test strip USE AS INSTRUCTED TO CHECK BLOOD SUGAR THREE TIMES DAILY. 05/07/21   Reather Littler, MD  HYDROcodone-acetaminophen (NORCO/VICODIN) 5-325 MG tablet Take 1 tablet by mouth every 6 (six) hours as needed for moderate pain.    [provider]  ibuprofen (ADVIL) 200 MG tablet Take 400 mg by mouth every 6 (six) hours as needed for fever.    [provider]  insulin glargine, 2 Unit Dial, (TOUJEO MAX SOLOSTAR) 300 UNIT/ML Solostar Pen Inject 80 Units into the skin daily.    [provider]  losartan (COZAAR) 25 MG tablet Take 1 tablet (25 mg total) by mouth daily. 07/27/23   Perlie Gold, PA-C  methocarbamol (ROBAXIN) 500 MG tablet Take by mouth.  Take one tablet (500 mg dose) by mouth 2 (two) times a day as needed (PRN MUSCLE SPASMS). 03/19/23   [provider]  nystatin (  MYCOSTATIN/NYSTOP) powder Apply topically 2 (two) times daily.  Apply topically 2 (two) times daily. 03/17/23   [provider]  Omega-3 Fatty Acids (FISH OIL) 1000 MG CAPS Take 2,000 mg by mouth daily.    [provider]  pravastatin (PRAVACHOL) 40 MG tablet TAKE 1 TABLET BY MOUTH EVERY DAY 02/05/22   Reather Littler, MD  pregabalin (LYRICA) 100 MG capsule Take 1 capsule (100 mg total) by mouth 2 (two) times daily. 04/01/22   Pricilla Riffle, MD  spironolactone (ALDACTONE) 25 MG tablet Take 0.5 tablets (12.5 mg total) by mouth daily. 07/27/23   Perlie Gold, PA-C  tirzepatide [facility]) 15 MG/0.5ML Pen  Inject 15 mg into the skin once a week. 08/05/23   Quillian Quince D, MD  traZODone (DESYREL) 100 MG tablet Take 100 mg by mouth at bedtime. 12/27/22   [provider]  Vitamin D, Ergocalciferol, (DRISDOL) 1.25 MG (50000 UNIT) CAPS capsule Take 1 capsule (50,000 Units total) by mouth every 7 (seven) days. 08/05/23   Wilder Glade, MD  XARELTO 20 MG TABS tablet TAKE 1 TABLET BY MOUTH DAILY WITH SUPPER 03/18/23   Pricilla Riffle, MD      Allergies    Simvastatin and Sulfa antibiotics    Review of Systems   Review of Systems  Gastrointestinal:  Positive for hematochezia.  All other systems reviewed and are negative.   Physical Exam Updated Vital Signs BP 121/72 (BP Location: Left Arm)   Pulse 81   Temp 98.3 F (36.8 C)   Resp 16   Ht 5' (1.524 m)   Wt 120.7 kg   SpO2 96%   BMI 51.95 kg/m  Physical Exam Vitals and nursing note reviewed.  72 year old female, resting comfortably and in no acute distress. Vital signs are normal. Oxygen saturation is 96%, which is normal. Head is normocephalic and atraumatic. PERRLA, EOMI.  Lungs are clear without rales, wheezes, or rhonchi. Chest is nontender. Heart has regular rate and rhythm without murmur. Abdomen is soft, flat, nontender. Rectal: Steady oozing of blood from perirectal incision. Extremities have 1+ edema with moderate venous stasis changes bilaterally. Skin is warm and dry without other rash. Neurologic: Awake and alert, moves all extremities equally.  ED Results / Procedures / Treatments   Labs (all labs ordered are listed, but only abnormal results are displayed) Labs Reviewed  COMPREHENSIVE METABOLIC PANEL - Abnormal; Notable for the following components:      Result Value   CO2 19 (*)    Glucose, Bld 145 (*)    BUN 32 (*)    All other components within normal limits  CBC - Abnormal; Notable for the following components:   WBC 17.2 (*)    RDW 16.0 (*)    All other components within normal limits  POC OCCULT  BLOOD, ED   Procedures Procedures    Medications Ordered in ED Medications - No data to display  ED Course/ Medical Decision Making/ A&P                                 Medical Decision Making Amount and/or Complexity of Data Reviewed Labs: ordered.   Postoperative bleeding and patient who is anticoagulated.  I have reviewed her laboratory test, and my interpretation is normal hemoglobin, mild leukocytosis which is nonspecific, elevated random glucose level, elevated BUN.  I have her past records and note simple hemorrhoidectomy done on 09/01/2023.  She continues to have slow trickle of bleeding in the ED, has remained hemodynamically stable.  I feel that she needs to be evaluated by her surgeon.  I have discussed case with Dr. Luster Landsberg, who is on-call for patient's surgeon, Dr. Ashley Jacobs, and he requests patient be sent to [facility] emergency department.  I have discussed the case with Dr. Norlene Duel, who agrees to accept the patient.  Final Clinical Impression(s) / ED Diagnoses Final diagnoses:  Rectal bleeding  Chronic anticoagulation    Rx / DC Orders ED Discharge Orders     None         Dione Booze, MD 09/08/23 2244

## 2023-09-08 NOTE — Discharge Instructions (Addendum)
Go directly to the [facility] emergency department.  Asked them to call Dr. Luster Landsberg to come to see you.  He is the partner of Dr. Ashley Jacobs, who did your surgery.

## 2023-09-22 ENCOUNTER — Ambulatory Visit: Payer: PPO | Admitting: Internal Medicine

## 2023-09-23 ENCOUNTER — Ambulatory Visit (INDEPENDENT_AMBULATORY_CARE_PROVIDER_SITE_OTHER): Payer: PPO | Admitting: Family Medicine

## 2023-09-24 ENCOUNTER — Ambulatory Visit (INDEPENDENT_AMBULATORY_CARE_PROVIDER_SITE_OTHER): Payer: PPO | Admitting: Family Medicine

## 2023-09-24 VITALS — BP 127/75 | HR 86 | Temp 97.5°F | Ht 61.0 in | Wt 265.0 lb

## 2023-09-24 DIAGNOSIS — E1169 Type 2 diabetes mellitus with other specified complication: Secondary | ICD-10-CM

## 2023-09-24 DIAGNOSIS — E785 Hyperlipidemia, unspecified: Secondary | ICD-10-CM | POA: Diagnosis not present

## 2023-09-24 DIAGNOSIS — Z7985 Long-term (current) use of injectable non-insulin antidiabetic drugs: Secondary | ICD-10-CM

## 2023-09-24 DIAGNOSIS — Z794 Long term (current) use of insulin: Secondary | ICD-10-CM | POA: Diagnosis not present

## 2023-09-24 DIAGNOSIS — E669 Obesity, unspecified: Secondary | ICD-10-CM

## 2023-09-24 DIAGNOSIS — E1165 Type 2 diabetes mellitus with hyperglycemia: Secondary | ICD-10-CM

## 2023-09-24 DIAGNOSIS — E559 Vitamin D deficiency, unspecified: Secondary | ICD-10-CM

## 2023-09-24 DIAGNOSIS — Z6841 Body Mass Index (BMI) 40.0 and over, adult: Secondary | ICD-10-CM

## 2023-09-24 NOTE — Progress Notes (Signed)
   SUBJECTIVE:  Chief Complaint: Obesity   Interim History: Patient normally sees Dr. Dalbert Garnet and is following up from her last appointment October 23.  She has had a surgery and two different emergency room visits after that surgery. Her husband has also been in the hospital as well.  She has had quite a bit of diarrhea since her hemorrhoidectomy.  She is trying to incorporate more meat and vegetables and is trying to get this in at every meal. Over the next few weeks she is staying local for the holidays.  Melanie Reeves is here to discuss her progress with her obesity treatment plan. She is on the practicing portion control and making smarter food choices, such as increasing vegetables and decreasing simple carbohydrates and states she is following her eating plan approximately 50 % of the time. She states she is not exercising.   OBJECTIVE: Visit Diagnoses: Problem List Items Addressed This Visit       Endocrine   Hyperlipidemia associated with type 2 diabetes mellitus (HCC)   Fasting for labs today. Last triglycerides over 300.  LDL was controlled at that time; those labs were done in August.  Repeat labs today and discuss results at next appointment.      Relevant Orders   Lipid Panel With LDL/HDL Ratio (Completed)   Uncontrolled type 2 diabetes mellitus with hyperglycemia, with long-term current use of insulin (HCC) - Primary   Average blood sugar for 90 days 163, 30 days 168, 14 days 158, 7 days 158.  No hypoglycemic events.  62% in range of 70-170, 33% in 171-250 and 5% >250 in the last 30 days. She is on Toujeo 80 units daily, Mounjaro 15mg  weekly, and canagliflozin-metformin.  Last A1c was in August.  CMP, A1c, C- peptide ordered today.      Relevant Orders   Comprehensive metabolic panel (Completed)   Hemoglobin A1c (Completed)   C-peptide     Other   Vitamin D deficiency   Last lab in August was close to goal at 85.  She has been taking Vit D supplementation.  Repeat Vitamin D  level today.      Relevant Orders   VITAMIN D 25 Hydroxy (Vit-D Deficiency, Fractures) (Completed)   Obesity, Beginning BMI 55.55   Other Visit Diagnoses       BMI 50.0-59.9, adult (HCC)           No data recorded No data recorded No data recorded No data recorded   ASSESSMENT AND PLAN:  Diet: Melanie Reeves is currently in the action stage of change. As such, her goal is to continue with weight loss efforts. She has agreed to practicing portion control and making smarter food choices, such as increasing vegetables and decreasing simple carbohydrates.  Exercise: Melanie Reeves has been instructed that some exercise is better than none for weight loss and overall health benefits.   Behavior Modification:  We discussed the following Behavioral Modification Strategies today: increasing lean protein intake, increasing vegetables, meal planning and cooking strategies, better snacking choices, holiday eating strategies, and planning for success.   No follow-ups on file.Marland Kitchen She was informed of the importance of frequent follow up visits to maximize her success with intensive lifestyle modifications for her multiple health conditions.  Attestation Statements:   Reviewed by clinician on day of visit: allergies, medications, problem list, medical history, surgical history, family history, social history, and previous encounter notes.     Melanie Likes, MD

## 2023-09-24 NOTE — Assessment & Plan Note (Signed)
Average blood sugar for 90 days 163, 30 days 168, 14 days 158, 7 days 158.  No hypoglycemic events.  62% in range of 70-170, 33% in 171-250 and 5% >250 in the last 30 days. She is on Toujeo 80 units daily, Mounjaro 15mg  weekly, and canagliflozin-metformin.  Last A1c was in August.  CMP, A1c, C- peptide ordered today.

## 2023-09-25 LAB — COMPREHENSIVE METABOLIC PANEL
ALT: 16 [IU]/L (ref 0–32)
AST: 24 [IU]/L (ref 0–40)
Albumin: 4.3 g/dL (ref 3.9–4.9)
Alkaline Phosphatase: 63 [IU]/L (ref 44–121)
BUN/Creatinine Ratio: 33 — ABNORMAL HIGH (ref 12–28)
BUN: 36 mg/dL — ABNORMAL HIGH (ref 8–27)
Bilirubin Total: 0.3 mg/dL (ref 0.0–1.2)
CO2: 19 mmol/L — ABNORMAL LOW (ref 20–29)
Calcium: 9.7 mg/dL (ref 8.7–10.3)
Chloride: 103 mmol/L (ref 96–106)
Creatinine, Ser: 1.1 mg/dL — ABNORMAL HIGH (ref 0.57–1.00)
Globulin, Total: 3.1 g/dL (ref 1.5–4.5)
Glucose: 152 mg/dL — ABNORMAL HIGH (ref 70–99)
Potassium: 4.6 mmol/L (ref 3.5–5.2)
Sodium: 141 mmol/L (ref 134–144)
Total Protein: 7.4 g/dL (ref 6.0–8.5)
eGFR: 54 mL/min/{1.73_m2} — ABNORMAL LOW (ref >59)

## 2023-09-25 LAB — HEMOGLOBIN A1C
Est. average glucose Bld gHb Est-mCnc: 154 mg/dL
Hgb A1c MFr Bld: 7 % — ABNORMAL HIGH (ref 4.8–5.6)

## 2023-09-25 LAB — LIPID PANEL WITH LDL/HDL RATIO
Cholesterol, Total: 181 mg/dL (ref 100–199)
HDL: 46 mg/dL (ref >39)
LDL Chol Calc (NIH): 83 mg/dL (ref 0–99)
LDL/HDL Ratio: 1.8 {ratio} (ref 0.0–3.2)
Triglycerides: 321 mg/dL — ABNORMAL HIGH (ref 0–149)
VLDL Cholesterol Cal: 52 mg/dL — ABNORMAL HIGH (ref 5–40)

## 2023-09-25 LAB — VITAMIN D 25 HYDROXY (VIT D DEFICIENCY, FRACTURES): Vit D, 25-Hydroxy: 37.9 ng/mL (ref 30.0–100.0)

## 2023-09-25 LAB — C-PEPTIDE: C-Peptide: 6.3 ng/mL — ABNORMAL HIGH (ref 1.1–4.4)

## 2023-10-04 NOTE — Assessment & Plan Note (Signed)
Fasting for labs today. Last triglycerides over 300.  LDL was controlled at that time; those labs were done in August.  Repeat labs today and discuss results at next appointment.

## 2023-10-04 NOTE — Assessment & Plan Note (Signed)
Last lab in August was close to goal at 66.  She has been taking Vit D supplementation.  Repeat Vitamin D level today.

## 2023-11-04 ENCOUNTER — Ambulatory Visit (INDEPENDENT_AMBULATORY_CARE_PROVIDER_SITE_OTHER): Payer: PPO | Admitting: Family Medicine

## 2023-11-18 ENCOUNTER — Ambulatory Visit (INDEPENDENT_AMBULATORY_CARE_PROVIDER_SITE_OTHER): Payer: PPO | Admitting: Family Medicine

## 2023-11-18 ENCOUNTER — Encounter (INDEPENDENT_AMBULATORY_CARE_PROVIDER_SITE_OTHER): Payer: Self-pay | Admitting: Family Medicine

## 2023-11-18 VITALS — BP 146/84 | HR 86 | Temp 97.3°F | Ht 61.0 in | Wt 263.0 lb

## 2023-11-18 DIAGNOSIS — E119 Type 2 diabetes mellitus without complications: Secondary | ICD-10-CM

## 2023-11-18 DIAGNOSIS — Z6841 Body Mass Index (BMI) 40.0 and over, adult: Secondary | ICD-10-CM | POA: Diagnosis not present

## 2023-11-18 DIAGNOSIS — Z794 Long term (current) use of insulin: Secondary | ICD-10-CM

## 2023-11-18 DIAGNOSIS — E559 Vitamin D deficiency, unspecified: Secondary | ICD-10-CM | POA: Diagnosis not present

## 2023-11-18 DIAGNOSIS — R0602 Shortness of breath: Secondary | ICD-10-CM | POA: Diagnosis not present

## 2023-11-18 DIAGNOSIS — E669 Obesity, unspecified: Secondary | ICD-10-CM

## 2023-11-18 DIAGNOSIS — Z7985 Long-term (current) use of injectable non-insulin antidiabetic drugs: Secondary | ICD-10-CM

## 2023-11-18 MED ORDER — VITAMIN D (ERGOCALCIFEROL) 1.25 MG (50000 UNIT) PO CAPS: 50000.0000 [IU] | ORAL_CAPSULE | ORAL | 0 refills | Status: DC

## 2023-11-18 NOTE — Progress Notes (Signed)
 .smr  Office: [phone]  /  Fax: [phone]  WEIGHT SUMMARY AND BIOMETRICS  Anthropometric Measurements Height: 5' 1 (1.549 m) Weight: 263 lb (119.3 kg) BMI (Calculated): 49.72 Weight at Last Visit: 265 lb Weight Lost Since Last Visit: 2 lb Weight Gained Since Last Visit: 0 Starting Weight: 266 lb Total Weight Loss (lbs): 3 lb (1.361 kg)   Body Composition  Body Fat %: 57.8 % Fat Mass (lbs): 152.4 lbs Muscle Mass (lbs): 105.8 lbs Visceral Fat Rating : 23   Other Clinical Data RMR: 2131 Fasting: Yes Labs: Yes Today's Visit #: 74 Starting Date: 11/29/20 Comments: IC DONE TODAY    Chief Complaint: OBESITY    History of Present Illness   Melanie Reeves is a 73 year old female who presents to discuss her weight management.  She has been focusing on weight management, having lost two pounds over the last two months. She practices portion control and makes smart dietary choices about seventy percent of the time. She is not currently engaging in any exercise and experiences shortness of breath with physical activity. An indirect calorimetry test was repeated today to assess her metabolic rate.  She has a history of type 2 diabetes, with her A1c recently noted to be 7.0. Her blood sugar levels were in the 'hundred percent range' when she was sick and are now in the 'ninety percent range'. She is currently taking Mounjaro , which she feels has been beneficial in managing her blood sugar and reducing hunger. She has a five-month supply of this medication.  She has a vitamin D  deficiency, with her most recent level being 37, below the target range of 50-60. She takes vitamin D  once a week on Saturdays.  In terms of her diet, she consumes eggs, sausage, and cheese sticks, although she has cut back on cheese sticks. She is considering incorporating protein shakes to increase her protein intake, as her resting metabolic rate has decreased, possibly due to insufficient protein and  exercise. She typically eats meat every night and has been reducing her bread intake, opting for salads and vegetables like broccoli.          PHYSICAL EXAM:  Blood pressure (!) 146/84, pulse 86, temperature (!) 97.3 F (36.3 C), height 5' 1 (1.549 m), weight 263 lb (119.3 kg), SpO2 94%. Body mass index is 49.69 kg/m.  IC results show significantly decreased RMR from 2 yars ago, now 2131, down from 2561  DIAGNOSTIC DATA REVIEWED:  BMET    Component Value Date/Time   NA 141 09/24/2023 1443   K 4.6 09/24/2023 1443   CL 103 09/24/2023 1443   CO2 19 (L) 09/24/2023 1443   GLUCOSE 152 (H) 09/24/2023 1443   GLUCOSE 145 (H) 09/07/2023 2330   BUN 36 (H) 09/24/2023 1443   CREATININE 1.10 (H) 09/24/2023 1443   CALCIUM 9.7 09/24/2023 1443   GFRNONAA >60 09/07/2023 2330   GFRAA >60 10/02/2016 1300   Lab Results  Component Value Date   HGBA1C 7.0 (H) 09/24/2023   HGBA1C 11.4 (A) 07/02/2015   Lab Results  Component Value Date   INSULIN  31.7 (H) 05/26/2023   INSULIN  34.8 (H) 10/16/2021   Lab Results  Component Value Date   TSH 1.217 11/24/2020   CBC    Component Value Date/Time   WBC 17.2 (H) 09/07/2023 2330   RBC 4.62 09/07/2023 2330   HGB 12.7 09/07/2023 2330   HGB 12.9 06/24/2022 1153   HCT 39.8 09/07/2023 2330   HCT 40.5 06/24/2022  1153   PLT 343 09/07/2023 2330   PLT 245 06/24/2022 1153   MCV 86.1 09/07/2023 2330   MCV 84 06/24/2022 1153   MCH 27.5 09/07/2023 2330   MCHC 31.9 09/07/2023 2330   RDW 16.0 (H) 09/07/2023 2330   RDW 14.7 06/24/2022 1153   Iron Studies    Component Value Date/Time   FERRITIN 264 10/25/2020 0634   Lipid Panel     Component Value Date/Time   CHOL 181 09/24/2023 1443   TRIG 321 (H) 09/24/2023 1443   HDL 46 09/24/2023 1443   CHOLHDL 3 07/15/2021 1054   VLDL 30.6 07/15/2021 1054   LDLCALC 83 09/24/2023 1443   LDLDIRECT 72.0 02/28/2020 1124   Hepatic Function Panel     Component Value Date/Time   PROT 7.4 09/24/2023 1443    ALBUMIN 4.3 09/24/2023 1443   AST 24 09/24/2023 1443   ALT 16 09/24/2023 1443   ALKPHOS 63 09/24/2023 1443   BILITOT 0.3 09/24/2023 1443      Component Value Date/Time   TSH 1.217 11/24/2020 0352   TSH 1.420 08/24/2017 1608   Nutritional Lab Results  Component Value Date   VD25OH 37.9 09/24/2023   VD25OH 57.0 05/26/2023   VD25OH 43.1 10/16/2021     Assessment and Plan    Obesity with exercise induced shortness of breath Obesity with recent weight loss of two pounds over two months. She is working on portion control and making healthy choices about 70% of the time but is not exercising. Shortness of breath with exercise noted. Indirect calorimetry shows a significant fall in resting metabolic rate, likely due to insufficient protein intake and lack of exercise. Discussed increasing protein intake to 85-100 grams/day to improve metabolism and aid in weight loss. Encouraged regular exercise to support weight loss and improve symptoms. - Increase protein intake to 85-100 grams/day, consider protein shakes - Encourage regular exercise - Continue portion control and healthy food choices  Type 2 Diabetes Mellitus Type 2 Diabetes Mellitus with an A1c of 7.0, above the target of <7.0. She reports good blood sugar control with Mounjaro , which also helps reduce hunger. Discussed benefits of Mounjaro  in managing blood sugar and aiding weight loss. - Continue Mounjaro  as prescribed - Monitor blood sugar levels regularly - Reassess A1c at next visit  Vitamin D  Deficiency Vitamin D  level of 37, below the target range of 50-60. Previously advised to take Vitamin D2 but was not renewed by another provider. Discussed importance of maintaining adequate Vitamin D  levels for overall health. - Prescribe Vitamin D2 once a week - Recheck Vitamin D  levels at next visit  General Health Maintenance Discussed importance of regular follow-up visits and maintaining insurance coverage for continued care. -  Schedule follow-up visit in one month - Schedule additional visit in April.        She was informed of the importance of frequent follow up visits to maximize her success with intensive lifestyle modifications for her multiple health conditions.    Louann Penton, MD

## 2023-12-08 ENCOUNTER — Other Ambulatory Visit (INDEPENDENT_AMBULATORY_CARE_PROVIDER_SITE_OTHER): Payer: Self-pay | Admitting: Family Medicine

## 2023-12-08 DIAGNOSIS — E559 Vitamin D deficiency, unspecified: Secondary | ICD-10-CM

## 2023-12-13 ENCOUNTER — Other Ambulatory Visit: Payer: Self-pay | Admitting: Physician Assistant

## 2023-12-14 ENCOUNTER — Ambulatory Visit: Payer: PPO | Admitting: Cardiology

## 2023-12-16 ENCOUNTER — Ambulatory Visit (INDEPENDENT_AMBULATORY_CARE_PROVIDER_SITE_OTHER): Payer: PPO | Admitting: Family Medicine

## 2023-12-22 ENCOUNTER — Ambulatory Visit: Payer: PPO | Admitting: Physician Assistant

## 2023-12-23 NOTE — Progress Notes (Unsigned)
 Office Visit    Patient Name: Melanie Reeves Date of Encounter: 12/24/2023  PCP:  Eartha Inch, MD   [facility]  Cardiologist:  Dietrich Pates, MD  Advanced Practice Provider:  No care team member to display Electrophysiologist:  None    HPI    Melanie Reeves is a 73 y.o. female with past medical history of anxiety, atrial fibrillation, chest pain, depression, diabetes mellitus, hypertension, hyperlipidemia, morbid obesity, edema, stage III kidney disease presents today for follow-up appointment.  Patient was last seen by Dr. Tenny Craw 06/24/2022.  She had a Myoview in 2018 that was normal.  Echocardiogram February 2022 was done showing normal LVEF and mild diastolic dysfunction.  Denied palpitations, chest pain.  Did have a cough which was productive.  Denied fevers.  Patient was recently in the ER for evaluation of shortness of breath as well as sinus congestion intermittently going on for about a month.  Symptoms have been worse over the last few days prior to ER visit.  Prior to EKG finding plan was to start back on Lasix.  Stated her sinus congestion was not as bad as it had been in the past.  No cough.  Afebrile.  Endorsed bilateral peripheral edema.  Stated she gained about 3 pounds and believes it was fluid.  Does have a history of CHF however had been off Lasix for quite some time.  Reported baseline sleeps in recliner due to history of frozen shoulder.  Initial troponin 3.  EKG without acute ischemic changes.  No suspicion for ACS.  Was given Lasix.  She saw me 12/24/22, she tells me that she felt like her heart was pounding and she was having some shortness of breath because of that.  She was not taking her fluid pill.  She routinely sleeps in a recliner.  She is lost some weight over the last 6 months or so (26 pounds).  She is being [facility] on [facility]  She is trying to get more exercise.  She states her diet is about  80/20.  She has been taking Lasix 40 mg daily with some improvement of lower extremity edema.  We discussed compression and use of elastic therapy in [place].  She is asked for some refills today. She went to a wedding recently and was able to dance most of the night but paid for the next morning with sore joints. If palpitations return would consider a monitor.  Reports no chest pain, pressure, or tightness. No edema, orthopnea, PND.   Today, she presents with a history of heart disease, diabetes, and sleep apnea, presents with chest tightness and sinus issues. The patient describes the chest tightness as a feeling of tightness, but does not report any associated symptoms such as shortness of breath or pain. The patient also reports a feeling of weakness when tired, which she attributes to her sinus issues. The patient uses a CPAP machine for sleep apnea, but has had difficulty using it due to sinus issues. The patient reports coughing up mucus and a burning sensation in the nose when using the CPAP machine. The patient also reports a history of swelling in the feet, which is currently under control. The patient has a history of atrial fibrillation and is on Xarelto, which she is considering switching to Eliquis. The patient also mentions a history of shoulder issues, with a suspicion of a rotator cuff injury.  Reports no shortness of breath  nor dyspnea on exertion. Reports no chest pain, pressure, or tightness. No orthopnea, PND. Reports no palpitations.   Discussed the use of AI scribe software for clinical note transcription with the patient, who gave verbal consent to proceed.  Past Medical History    Past Medical History:  Diagnosis Date   Abnormal electrocardiogram 08/14/2014   LBBB   Anemia    Anxiety    Arthritis    Atrial fibrillation (HCC)    Bilateral swelling of feet    Chest pain    Constipation    Depression    Diabetes mellitus (HCC)    Difficulty walking    DISC  DEGENERATION 12/20/2009   Qualifier: Diagnosis of  By: Romeo Apple MD, [place]     Dyspnea 08/14/2014   Edema    Gallbladder problem    GERD (gastroesophageal reflux disease)    Hyperlipidemia    Hypertension    LOW BACK PAIN 12/20/2009   Qualifier: Diagnosis of  By: Romeo Apple MD, [place]     Morbid obesity (HCC)    OSA (obstructive sleep apnea) 08/16/2014   Osteoarthritis    Other fatigue    Palpitations    Seasonal allergies    Shortness of breath    Shortness of breath on exertion    Sinusitis    SPINAL STENOSIS 12/20/2009   Qualifier: Diagnosis of  By: Romeo Apple MD, [place]     Stage 3 chronic kidney disease [facility])    Past Surgical History:  Procedure Laterality Date   CHOLECYSTECTOMY     COLONOSCOPY WITH PROPOFOL N/A 11/29/2014   Procedure: COLONOSCOPY WITH PROPOFOL;  Surgeon: Willis Modena, MD;  Location: [facility] END[facility]COPY;  Service: Endoscopy;  Laterality: N/A;   HEMORRHOID SURGERY     SKIN CANCER EXCISION     TONSILLECTOMY      Allergies  Allergies  Allergen Reactions   Simvastatin Other (See Comments)    Other reaction(s): leg weakness Other reaction(s): leg weakness   Sulfa Antibiotics Other (See Comments)    Cant remember Other reaction(s): as a child     EKGs/Labs/Other Studies Reviewed:   The following studies were reviewed today:  11/2020 Echo   1. Left ventricular ejection fraction, by estimation, is 55 to 60%. The left ventricle has normal function. The left ventricle has no regional wall motion abnormalities. There is moderate concentric left ventricular hypertrophy. Left ventricular diastolic parameters are consistent with Grade I diastolic dysfunction (impaired relaxation). 2. Right ventricular systolic function is normal. The right ventricular size is normal. There is normal pulmonary artery systolic pressure. 3. The mitral valve is normal in structure. Mild mitral valve regurgitation. No evidence of mitral stenosis. 4. The aortic valve is tricuspid.  Aortic valve regurgitation is not visualized. No aortic stenosis is present. Aortic valve mean gradient measures 9.0 mmHg. 5. The inferior vena cava is normal in size with greater than 50% respiratory variability, suggesting right atrial pressure of 3 mmHg. Comparison(s): A prior study was performed on 09/20/2017. No significant change from prior study. Prior images reviewed side by side.    EKG:  EKG is not ordered today.    Recent Labs: 09/07/2023: Hemoglobin 12.7; Platelets 343 09/24/2023: ALT 16; BUN 36; Creatinine, Ser 1.10; Potassium 4.6; Sodium 141  Recent Lipid Panel    Component Value Date/Time   CHOL 181 09/24/2023 1443   TRIG 321 (H) 09/24/2023 1443   HDL 46 09/24/2023 1443   CHOLHDL 3 07/15/2021 1054   VLDL 30.6 07/15/2021 1054   LDLCALC 83 09/24/2023 1443  LDLDIRECT 72.0 02/28/2020 1124     Home Medications   Current Meds  Medication Sig   apixaban (ELIQUIS) 5 MG TABS tablet Take 1 tablet (5 mg total) by mouth 2 (two) times daily.   apixaban (ELIQUIS) 5 MG TABS tablet Take 1 tablet (5 mg total) by mouth 2 (two) times daily.   atenolol (TENORMIN) 25 MG tablet Take 1 tablet (25 mg total) by mouth daily.   B-D ULTRAFINE III SHORT PEN 31G X 8 MM MISC USE TO INJECT MEDICATION 5 TIMES DAILY.   Blood Glucose Monitoring Suppl (ONETOUCH VERIO) w/Device KIT UAD to monitor glucose tid; Dx: E11.65   Canagliflozin-metFORMIN HCl [phone] MG TABS Take 2 tablets by mouth daily with supper.   cetirizine (ZYRTEC) 10 MG tablet Take 10 mg by mouth daily.  Take one tablet (10 mg dose) by mouth every morning. QAM   Continuous Glucose Sensor (FREESTYLE LIBRE 3 SENSOR) MISC Place 1 sensor on the skin every 14 days. Use to check glucose continuously   diclofenac Sodium (VOLTAREN) 1 % GEL ) Topical 4 Times Daily PRN   DULoxetine (CYMBALTA) 30 MG capsule TAKE 1 CAPSULE BY MOUTH DAILY. TAKE IN ADDITION TO THE 60 MG   DULoxetine (CYMBALTA) 60 MG capsule Take 60 mg by mouth daily. Taking in  the AM   fenofibrate 160 MG tablet TAKE 1 TABLET BY MOUTH EVERY DAY   furosemide (LASIX) 40 MG tablet Take 1 tablet (40 mg total) by mouth daily as needed (leg swelling or weight gain of 2 lbs in a day or 5lbs in a week).   glucose blood (ONETOUCH ULTRA) test strip USE AS INSTRUCTED TO CHECK BLOOD SUGAR THREE TIMES DAILY.   HYDROcodone-acetaminophen (NORCO/VICODIN) 5-325 MG tablet Take 1 tablet by mouth every 6 (six) hours as needed for moderate pain.   ibuprofen (ADVIL) 200 MG tablet Take 400 mg by mouth every 6 (six) hours as needed for fever.   insulin glargine, 2 Unit Dial, (TOUJEO MAX SOLOSTAR) 300 UNIT/ML Solostar Pen Inject 80 Units into the skin daily.   losartan (COZAAR) 25 MG tablet Take 1 tablet (25 mg total) by mouth daily.   methocarbamol (ROBAXIN) 500 MG tablet Take by mouth.  Take one tablet (500 mg dose) by mouth 2 (two) times a day as needed (PRN MUSCLE SPASMS).   nystatin (MYCOSTATIN/NYSTOP) powder Apply topically 2 (two) times daily.  Apply topically 2 (two) times daily.   Omega-3 Fatty Acids (FISH OIL) 1000 MG CAPS Take 2,000 mg by mouth daily.   pravastatin (PRAVACHOL) 40 MG tablet TAKE 1 TABLET BY MOUTH EVERY DAY   pregabalin (LYRICA) 100 MG capsule Take 1 capsule (100 mg total) by mouth 2 (two) times daily.   spironolactone (ALDACTONE) 25 MG tablet Take 0.5 tablets (12.5 mg total) by mouth daily.   tirzepatide (MOUNJARO) 15 MG/0.5ML Pen Inject 15 mg into the skin once a week.   traZODone (DESYREL) 100 MG tablet Take 100 mg by mouth at bedtime.   Vitamin D, Ergocalciferol, (DRISDOL) 1.25 MG (50000 UNIT) CAPS capsule Take 1 capsule (50,000 Units total) by mouth every 7 (seven) days.   XIGDUO XR 02-999 MG TB24 Take 1 tablet by mouth 2 (two) times daily.   [DISCONTINUED] XARELTO 20 MG TABS tablet TAKE 1 TABLET BY MOUTH DAILY WITH SUPPER     Review of Systems      All other systems reviewed and are otherwise negative except as noted above.  Physical Exam    VS:  BP 112/70  Pulse 85   Ht 5\' 1"  (1.549 m)   Wt 267 lb 12.8 oz (121.5 kg)   SpO2 95%   BMI 50.60 kg/m  , BMI Body mass index is 50.6 kg/m.  Wt Readings from Last 3 Encounters:  12/24/23 267 lb 12.8 oz (121.5 kg)  11/18/23 263 lb (119.3 kg)  09/24/23 265 lb (120.2 kg)     GEN: Well nourished, well developed, in no acute distress. HEENT: normal. Neck: Supple, no JVD, carotid bruits, or masses. Cardiac: RRR, no murmurs, rubs, or gallops. No clubbing, cyanosis, edema.  Radials/PT 2+ and equal bilaterally.  Respiratory:  Respirations regular and unlabored, clear to auscultation bilaterally. GI: Soft, nontender, nondistended. MS: No deformity or atrophy. Skin: Warm and dry, no rash. Venous stasis changes on her LL bilaterally Neuro:  Strength and sensation are intact. Psych: Normal affect.  Assessment & Plan   Atrial fibrillation Asymptomatic atrial fibrillation detected via Apple Watch. Planned switch from Xarelto to Eliquis due to insurance. - Switch from Xarelto to Eliquis. - Monitor for insurance coverage issues with Eliquis.  Venous insufficiency Occasional pedal edema managed with Lasix. Leg discoloration due to venous insufficiency. - Use Lasix as needed for edema. - Monitor weight and edema. - Consider compression therapy if edema increases.  Anemia Slightly low hemoglobin consistent with baseline. No intervention needed. - Monitor hemoglobin levels periodically.  Upper respiratory congestion Chest tightness and mucus production likely from sinus issues and CPAP use. Lungs clear. - Consider Mucinex if mucus production continues. - Use saline nasal spray for nasal dryness.  Follow-up Well-managed with frequent recent visits. Annual follow-up sufficient unless symptoms change. - Schedule follow-up in one year unless symptoms change.  LBBB -stable  Hyperlipidemia -LDL 66, HDL 46, total cholesterol 161, triglycerides 247 -She is on fenofibrate and Xarelto and continues to  struggle with hypertriglyceridemia -due for an updated lipid panel at next visit if not already done by PCP.         Disposition: Follow up 1 year with Dietrich Pates, MD or APP.  Signed, Sharlene Dory, PA-C 12/24/2023, 12:26 PM [facility]

## 2023-12-24 ENCOUNTER — Ambulatory Visit: Attending: Physician Assistant | Admitting: Physician Assistant

## 2023-12-24 ENCOUNTER — Encounter: Payer: Self-pay | Admitting: Physician Assistant

## 2023-12-24 ENCOUNTER — Encounter: Payer: Self-pay | Admitting: Internal Medicine

## 2023-12-24 VITALS — BP 112/70 | HR 85 | Ht 61.0 in | Wt 267.8 lb

## 2023-12-24 DIAGNOSIS — I10 Essential (primary) hypertension: Secondary | ICD-10-CM | POA: Diagnosis not present

## 2023-12-24 DIAGNOSIS — I480 Paroxysmal atrial fibrillation: Secondary | ICD-10-CM | POA: Diagnosis not present

## 2023-12-24 DIAGNOSIS — I447 Left bundle-branch block, unspecified: Secondary | ICD-10-CM | POA: Diagnosis not present

## 2023-12-24 DIAGNOSIS — I5032 Chronic diastolic (congestive) heart failure: Secondary | ICD-10-CM | POA: Diagnosis not present

## 2023-12-24 DIAGNOSIS — E785 Hyperlipidemia, unspecified: Secondary | ICD-10-CM

## 2023-12-24 MED ORDER — APIXABAN 5 MG PO TABS: 5.0000 mg | ORAL_TABLET | Freq: Two times a day (BID) | ORAL | Status: DC

## 2023-12-24 MED ORDER — APIXABAN 5 MG PO TABS: 5.0000 mg | ORAL_TABLET | Freq: Two times a day (BID) | ORAL | 1 refills | Status: DC

## 2023-12-24 NOTE — Telephone Encounter (Signed)
 Prescription refill request for Eliquis received. Indication: Afib  Last office visit:12/24/23 Asa Lente)  Scr: 1.10 (09/24/23)  Age: 73 Weight: 121.5kg  Appropriate dose. Refill sent.

## 2023-12-24 NOTE — Patient Instructions (Signed)
 Medication Instructions:  Start Eliquis 5 mg twice daily *If you need a refill on your cardiac medications before your next appointment, please call your pharmacy*   Lab Work: NONE If you have labs (blood work) drawn today and your tests are completely normal, you will receive your results only by: MyChart Message (if you have MyChart) OR A paper copy in the mail If you have any lab test that is abnormal or we need to change your treatment, we will call you to review the results.   Testing/Procedures: NONE   Follow-Up: At [facility], you and your health needs are our priority.  As part of our continuing mission to provide you with exceptional heart care, we have created designated Provider Care Teams.  These Care Teams include your primary Cardiologist (physician) and Advanced Practice Providers (APPs -  Physician Assistants and Nurse Practitioners) who all work together to provide you with the care you need, when you need it.  We recommend signing up for the patient portal called "MyChart".  Sign up information is provided on this After Visit Summary.  MyChart is used to connect with patients for Virtual Visits (Telemedicine).  Patients are able to view lab/test results, encounter notes, upcoming appointments, etc.  Non-urgent messages can be sent to your provider as well.   To learn more about what you can do with MyChart, go to [email or web address].    Your next appointment:   1 year(s)  Provider:   Dietrich Pates, MD     Other Instructions   1st Floor: - Lobby - Registration  - Pharmacy  - Lab - Cafe  2nd Floor: - PV Lab - Diagnostic Testing (echo, CT, nuclear med)  3rd Floor: - Vacant  4th Floor: - TCTS (cardiothoracic surgery) - AFib Clinic - Structural Heart Clinic - Vascular Surgery  - Vascular Ultrasound  5th Floor: - HeartCare Cardiology (general and EP) - Clinical Pharmacy for coumadin, hypertension, lipid, weight-loss medications, and med  management appointments    Valet parking services will be available as well.

## 2023-12-29 ENCOUNTER — Encounter: Payer: Self-pay | Admitting: Physician Assistant

## 2024-01-11 ENCOUNTER — Telehealth (INDEPENDENT_AMBULATORY_CARE_PROVIDER_SITE_OTHER): Payer: Self-pay

## 2024-01-11 NOTE — Telephone Encounter (Signed)
 Your request has been approved 31-MAR-25:31-MAR-26 Mounjaro 15MG /0.5ML [place] SOAJ Quantity:2;

## 2024-01-12 DIAGNOSIS — M19011 Primary osteoarthritis, right shoulder: Secondary | ICD-10-CM | POA: Insufficient documentation

## 2024-01-14 ENCOUNTER — Ambulatory Visit (INDEPENDENT_AMBULATORY_CARE_PROVIDER_SITE_OTHER): Payer: PPO | Admitting: Family Medicine

## 2024-01-14 ENCOUNTER — Encounter (INDEPENDENT_AMBULATORY_CARE_PROVIDER_SITE_OTHER): Payer: Self-pay | Admitting: Family Medicine

## 2024-01-14 VITALS — BP 126/73 | HR 79 | Temp 97.7°F | Ht 61.0 in | Wt 264.0 lb

## 2024-01-14 DIAGNOSIS — E1169 Type 2 diabetes mellitus with other specified complication: Secondary | ICD-10-CM

## 2024-01-14 DIAGNOSIS — I10 Essential (primary) hypertension: Secondary | ICD-10-CM

## 2024-01-14 DIAGNOSIS — E559 Vitamin D deficiency, unspecified: Secondary | ICD-10-CM | POA: Diagnosis not present

## 2024-01-14 DIAGNOSIS — E1165 Type 2 diabetes mellitus with hyperglycemia: Secondary | ICD-10-CM | POA: Diagnosis not present

## 2024-01-14 DIAGNOSIS — E785 Hyperlipidemia, unspecified: Secondary | ICD-10-CM

## 2024-01-14 DIAGNOSIS — E669 Obesity, unspecified: Secondary | ICD-10-CM

## 2024-01-14 DIAGNOSIS — Z6841 Body Mass Index (BMI) 40.0 and over, adult: Secondary | ICD-10-CM

## 2024-01-14 DIAGNOSIS — Z7985 Long-term (current) use of injectable non-insulin antidiabetic drugs: Secondary | ICD-10-CM

## 2024-01-14 DIAGNOSIS — Z794 Long term (current) use of insulin: Secondary | ICD-10-CM

## 2024-01-14 MED ORDER — VITAMIN D (ERGOCALCIFEROL) 1.25 MG (50000 UNIT) PO CAPS: 50000.0000 [IU] | ORAL_CAPSULE | ORAL | 0 refills | Status: DC

## 2024-01-14 NOTE — Progress Notes (Signed)
 Office: [phone]  /  Fax: [phone]  WEIGHT SUMMARY AND BIOMETRICS  Anthropometric Measurements Height: 5\' 1"  (1.549 m) Weight: 264 lb (119.7 kg) BMI (Calculated): 49.91 Weight at Last Visit: 263 lb Weight Lost Since Last Visit: 0 Weight Gained Since Last Visit: 1 lb Starting Weight: 266 lb Total Weight Loss (lbs): 2 lb (0.907 kg)   Body Composition  Body Fat %: 56.9 % Fat Mass (lbs): 150.6 lbs Muscle Mass (lbs): 108.4 lbs Total Body Water (lbs): 87.2 lbs Visceral Fat Rating : 23   Other Clinical Data Fasting: No Labs: No Today's Visit #: 44 Starting Date: 11/29/20    Chief Complaint: OBESITY   History of Present Illness Melanie Reeves is a 73 year old female who presents to discuss her obesity treatment plan and assess her progress.  She is following a portion control smart choices eating plan and aims to consume 85 to 100 grams of protein daily, achieving this about 60% of the time. She is not currently exercising and has gained one pound in the last two months since her last visit. She uses protein powder and Premier protein shakes as part of her dietary regimen. She has lost at least 35 pounds and notices weight gain when it becomes difficult to bend over or get up.  She has a history of hypertension and is currently on propranolol and losartan. Her blood pressure is well controlled. She is working on diet and exercise to help manage her blood pressure.  She has a history of vitamin D deficiency and is on prescription vitamin D 50,000 IU per week. Her last vitamin D level, done recently, was low at 24.  She reports variable pain levels, typically ranging from 2 to 3, and is on pregabalin and Cymbalta, which help manage her symptoms. Without these medications, her leg pain would be 'horrendous' and she would experience more 'zings' throughout her body, often in her feet, which feel tingly all the time.      PHYSICAL EXAM:  Blood pressure 126/73, pulse  79, temperature 97.7 F (36.5 C), height 5\' 1"  (1.549 m), weight 264 lb (119.7 kg), SpO2 95%. Body mass index is 49.88 kg/m.  DIAGNOSTIC DATA REVIEWED:  BMET    Component Value Date/Time   NA 141 09/24/2023 1443   K 4.6 09/24/2023 1443   CL 103 09/24/2023 1443   CO2 19 (L) 09/24/2023 1443   GLUCOSE 152 (H) 09/24/2023 1443   GLUCOSE 145 (H) 09/07/2023 2330   BUN 36 (H) 09/24/2023 1443   CREATININE 1.10 (H) 09/24/2023 1443   CALCIUM 9.7 09/24/2023 1443   GFRNONAA >60 09/07/2023 2330   GFRAA >60 10/02/2016 1300   Lab Results  Component Value Date   HGBA1C 7.0 (H) 09/24/2023   HGBA1C 11.4 (A) 07/02/2015   Lab Results  Component Value Date   INSULIN 31.7 (H) 05/26/2023   INSULIN 34.8 (H) 10/16/2021   Lab Results  Component Value Date   TSH 1.217 11/24/2020   CBC    Component Value Date/Time   WBC 17.2 (H) 09/07/2023 2330   RBC 4.62 09/07/2023 2330   HGB 12.7 09/07/2023 2330   HGB 12.9 06/24/2022 1153   HCT 39.8 09/07/2023 2330   HCT 40.5 06/24/2022 1153   PLT 343 09/07/2023 2330   PLT 245 06/24/2022 1153   MCV 86.1 09/07/2023 2330   MCV 84 06/24/2022 1153   MCH 27.5 09/07/2023 2330   MCHC 31.9 09/07/2023 2330   RDW 16.0 (H) 09/07/2023 2330  RDW 14.7 06/24/2022 1153   Iron Studies    Component Value Date/Time   FERRITIN 264 10/25/2020 0634   Lipid Panel     Component Value Date/Time   CHOL 181 09/24/2023 1443   TRIG 321 (H) 09/24/2023 1443   HDL 46 09/24/2023 1443   CHOLHDL 3 07/15/2021 1054   VLDL 30.6 07/15/2021 1054   LDLCALC 83 09/24/2023 1443   LDLDIRECT 72.0 02/28/2020 1124   Hepatic Function Panel     Component Value Date/Time   PROT 7.4 09/24/2023 1443   ALBUMIN 4.3 09/24/2023 1443   AST 24 09/24/2023 1443   ALT 16 09/24/2023 1443   ALKPHOS 63 09/24/2023 1443   BILITOT 0.3 09/24/2023 1443      Component Value Date/Time   TSH 1.217 11/24/2020 0352   TSH 1.420 08/24/2017 1608   Nutritional Lab Results  Component Value Date    VD25OH 37.9 09/24/2023   VD25OH 57.0 05/26/2023   VD25OH 43.1 10/16/2021     Assessment and Plan Assessment & Plan Obesity Obesity management is the primary focus. She follows a portion control smart choices eating plan but achieves her protein intake goal only 60% of the time. She has not been exercising and has gained one pound in the last two months. She is aware of the need to improve dietary choices and is working on her relationship with food. Emphasis is on persistence over perfection in weight management. - Continue portion control smart choices eating plan - Encourage increasing exercise - Emphasize persistence over perfection in weight management  Type 2 Diabetes Mellitus Type 2 diabetes is managed with Mounjaro. There is a refill issue due to an expired prescription under a previous provider's name. Plan to check A1c and related labs. - Refill Mounjaro prescription  - Check A1c and related labs  Hypertension Hypertension is well-controlled with propranolol and losartan. Blood pressure is 126/73 mmHg. She is also working on diet and exercise to aid blood pressure control. - Continue propranolol and losartan - Monitor blood pressure regularly  Hyperlipidemia Hyperlipidemia is being monitored. Plan to check cholesterol levels at the next visit. - Check cholesterol levels at next visit   Vitamin D Deficiency Vitamin D deficiency is treated with prescription vitamin D 50,000 IU weekly. Recent vitamin D level was low at 24 ng/mL. Plan to recheck levels at the next visit. - Continue vitamin D 50,000 IU weekly - Recheck vitamin D levels at next visit  Follow-up Follow-up is planned to reassess conditions and adjust treatment as necessary. - Follow up in 3-4 weeks - Ensure labs are done prior to next visit      She was informed of the importance of frequent follow up visits to maximize her success with intensive lifestyle modifications for her multiple health conditions.     Quillian Quince, MD

## 2024-01-15 LAB — CMP14+EGFR
ALT: 18 IU/L (ref 0–32)
AST: 15 IU/L (ref 0–40)
Albumin: 4.4 g/dL (ref 3.9–4.9)
Alkaline Phosphatase: 53 IU/L (ref 44–121)
BUN/Creatinine Ratio: 28 (ref 12–28)
BUN: 30 mg/dL — ABNORMAL HIGH (ref 8–27)
Bilirubin Total: <0.2 mg/dL (ref 0.0–1.2)
CO2: 16 mmol/L — ABNORMAL LOW (ref 20–29)
Calcium: 10.1 mg/dL (ref 8.7–10.3)
Chloride: 103 mmol/L (ref 96–106)
Creatinine, Ser: 1.06 mg/dL — ABNORMAL HIGH (ref 0.57–1.00)
Globulin, Total: 3 g/dL (ref 1.5–4.5)
Glucose: 149 mg/dL — ABNORMAL HIGH (ref 70–99)
Potassium: 5 mmol/L (ref 3.5–5.2)
Sodium: 137 mmol/L (ref 134–144)
Total Protein: 7.4 g/dL (ref 6.0–8.5)
eGFR: 57 mL/min/{1.73_m2} — ABNORMAL LOW (ref >59)

## 2024-01-15 LAB — LIPID PANEL WITH LDL/HDL RATIO
Cholesterol, Total: 144 mg/dL (ref 100–199)
HDL: 57 mg/dL (ref >39)
LDL Chol Calc (NIH): 63 mg/dL (ref 0–99)
LDL/HDL Ratio: 1.1 ratio (ref 0.0–3.2)
Triglycerides: 139 mg/dL (ref 0–149)
VLDL Cholesterol Cal: 24 mg/dL (ref 5–40)

## 2024-01-15 LAB — HEMOGLOBIN A1C
Est. average glucose Bld gHb Est-mCnc: 163 mg/dL
Hgb A1c MFr Bld: 7.3 % — ABNORMAL HIGH (ref 4.8–5.6)

## 2024-01-15 LAB — VITAMIN B12: Vitamin B-12: 383 pg/mL (ref 232–1245)

## 2024-01-15 LAB — MICROALBUMIN / CREATININE URINE RATIO
Creatinine, Urine: 54.9 mg/dL
Microalb/Creat Ratio: 52 mg/g{creat} — ABNORMAL HIGH (ref 0–29)
Microalbumin, Urine: 28.5 ug/mL

## 2024-01-15 LAB — VITAMIN D 25 HYDROXY (VIT D DEFICIENCY, FRACTURES): Vit D, 25-Hydroxy: 43.7 ng/mL (ref 30.0–100.0)

## 2024-01-18 DIAGNOSIS — N939 Abnormal uterine and vaginal bleeding, unspecified: Secondary | ICD-10-CM | POA: Insufficient documentation

## 2024-02-15 ENCOUNTER — Ambulatory Visit (INDEPENDENT_AMBULATORY_CARE_PROVIDER_SITE_OTHER): Admitting: Family Medicine

## 2024-02-15 ENCOUNTER — Encounter (INDEPENDENT_AMBULATORY_CARE_PROVIDER_SITE_OTHER): Payer: Self-pay | Admitting: Family Medicine

## 2024-02-15 VITALS — BP 130/73 | HR 61 | Temp 97.8°F | Ht 61.0 in | Wt 266.0 lb

## 2024-02-15 DIAGNOSIS — E1165 Type 2 diabetes mellitus with hyperglycemia: Secondary | ICD-10-CM

## 2024-02-15 DIAGNOSIS — E559 Vitamin D deficiency, unspecified: Secondary | ICD-10-CM

## 2024-02-15 DIAGNOSIS — Z7985 Long-term (current) use of injectable non-insulin antidiabetic drugs: Secondary | ICD-10-CM

## 2024-02-15 DIAGNOSIS — E669 Obesity, unspecified: Secondary | ICD-10-CM

## 2024-02-15 DIAGNOSIS — Z6841 Body Mass Index (BMI) 40.0 and over, adult: Secondary | ICD-10-CM | POA: Diagnosis not present

## 2024-02-15 DIAGNOSIS — Z794 Long term (current) use of insulin: Secondary | ICD-10-CM

## 2024-02-15 MED ORDER — VITAMIN D (ERGOCALCIFEROL) 1.25 MG (50000 UNIT) PO CAPS: 50000.0000 [IU] | ORAL_CAPSULE | ORAL | 0 refills | Status: DC

## 2024-02-15 NOTE — Progress Notes (Signed)
 Office: [phone]  /  Fax: [phone]  WEIGHT SUMMARY AND BIOMETRICS  Anthropometric Measurements Height: 5\' 1"  (1.549 m) Weight: 266 lb (120.7 kg) BMI (Calculated): 50.29 Weight at Last Visit: 264 lb Weight Lost Since Last Visit: 0 Weight Gained Since Last Visit: 2 lb Starting Weight: 266 lb Total Weight Loss (lbs): 0 lb (0 kg)   Body Composition  Body Fat %: 56.8 % Fat Mass (lbs): 151 lbs Muscle Mass (lbs): 109.2 lbs Total Body Water (lbs): 83 lbs Visceral Fat Rating : 23   Other Clinical Data Fasting: No Labs: No Today's Visit #: 45 Starting Date: 11/29/20    Chief Complaint: OBESITY   History of Present Illness Melanie Reeves is a 73 year old female with obesity and type two diabetes who presents for obesity treatment and progress assessment.  She is attempting to maintain a food journal with about 50% success and follows a category three eating plan. Despite these efforts, she is not engaging in any exercise and has gained two pounds since her last visit one month ago. She struggles with dietary habits, particularly with consuming potato chips due to availability and preference. She has been eating more salads, often with ham, and is trying to incorporate more chicken. She is taking vitamin D2, with her last level being 43, up from 39, with a goal of reaching 50. She manages her medication by organizing it monthly to ensure she does not miss doses.  Her type two diabetes is currently not well controlled, with her most recent hemoglobin A1c elevated at 7.3 last month. She feels very warm when walking any distance, which impacts her ability to engage in physical activity. She has been using Mounjaro  for approximately three years but recently experienced an issue with administration. She is trying to increase her protein intake, using Premier Protein shakes and considering other protein sources.  She has been more active recently, walking more due to necessity, and  is considering using her [employer] membership to access a pool for exercise. Her husband assists in meal preparation, and her daughter recently moved out, which she believes will help her meal planning.      PHYSICAL EXAM:  Blood pressure 130/73, pulse 61, temperature 97.8 F (36.6 C), height 5\' 1"  (1.549 m), weight 266 lb (120.7 kg), SpO2 97%. Body mass index is 50.26 kg/m.  DIAGNOSTIC DATA REVIEWED:  BMET    Component Value Date/Time   NA 137 01/14/2024 1054   K 5.0 01/14/2024 1054   CL 103 01/14/2024 1054   CO2 16 (L) 01/14/2024 1054   GLUCOSE 149 (H) 01/14/2024 1054   GLUCOSE 145 (H) 09/07/2023 2330   BUN 30 (H) 01/14/2024 1054   CREATININE 1.06 (H) 01/14/2024 1054   CALCIUM 10.1 01/14/2024 1054   GFRNONAA >60 09/07/2023 2330   GFRAA >60 10/02/2016 1300   Lab Results  Component Value Date   HGBA1C 7.3 (H) 01/14/2024   HGBA1C 11.4 (A) 07/02/2015   Lab Results  Component Value Date   INSULIN  31.7 (H) 05/26/2023   INSULIN  34.8 (H) 10/16/2021   Lab Results  Component Value Date   TSH 1.217 11/24/2020   CBC    Component Value Date/Time   WBC 17.2 (H) 09/07/2023 2330   RBC 4.62 09/07/2023 2330   HGB 12.7 09/07/2023 2330   HGB 12.9 06/24/2022 1153   HCT 39.8 09/07/2023 2330   HCT 40.5 06/24/2022 1153   PLT 343 09/07/2023 2330   PLT 245 06/24/2022 1153  MCV 86.1 09/07/2023 2330   MCV 84 06/24/2022 1153   MCH 27.5 09/07/2023 2330   MCHC 31.9 09/07/2023 2330   RDW 16.0 (H) 09/07/2023 2330   RDW 14.7 06/24/2022 1153   Iron Studies    Component Value Date/Time   FERRITIN 264 10/25/2020 0634   Lipid Panel     Component Value Date/Time   CHOL 144 01/14/2024 1054   TRIG 139 01/14/2024 1054   HDL 57 01/14/2024 1054   CHOLHDL 3 07/15/2021 1054   VLDL 30.6 07/15/2021 1054   LDLCALC 63 01/14/2024 1054   LDLDIRECT 72.0 02/28/2020 1124   Hepatic Function Panel     Component Value Date/Time   PROT 7.4 01/14/2024 1054   ALBUMIN 4.4 01/14/2024 1054    AST 15 01/14/2024 1054   ALT 18 01/14/2024 1054   ALKPHOS 53 01/14/2024 1054   BILITOT <0.2 01/14/2024 1054      Component Value Date/Time   TSH 1.217 11/24/2020 0352   TSH 1.420 08/24/2017 1608   Nutritional Lab Results  Component Value Date   VD25OH 43.7 01/14/2024   VD25OH 37.9 09/24/2023   VD25OH 57.0 05/26/2023     Assessment and Plan Assessment & Plan Type 2 diabetes mellitus, uncontrolled Type 2 diabetes is uncontrolled with a recent hemoglobin A1c of 7.3. She is on Mounjaro  for diabetes management. Fluid retention is contributing to weight gain. - Continue Mounjaro . - Emphasize weight loss through dietary changes and increased physical activity.  Obesity Obesity management is ongoing with a 2-pound weight gain since the last visit. She follows a category three eating plan but is not exercising. She maintains a food journal about 50% of the time, struggles with potato chips, and is working on increasing protein intake and reducing simple carbohydrates. She is considering pool exercises and has access to [employer]. - Continue category three eating plan with focus on portion control and smart choices. - Encourage increased protein intake and vegetables while decreasing simple carbohydrates like sweets and chips. - Promote protein snacks and drinks, such as [employer]. - Encourage physical activity, including walking and considering pool exercises weekly. - Discuss strategies to avoid potato chips and replace with healthier options like popcorn.  Vitamin D  deficiency Vitamin D  level has improved to 43 from 39, with a goal of 50. She is consistent with her weekly medication. - Refill vitamin D2 prescription. - Continue current vitamin D  supplementation regimen.   She was informed of the importance of frequent follow up visits to maximize her success with intensive lifestyle modifications for her multiple health conditions.    Jasmine Mesi, MD

## 2024-03-02 ENCOUNTER — Encounter: Payer: Self-pay | Admitting: Internal Medicine

## 2024-03-03 NOTE — Telephone Encounter (Signed)
 Please create preop encounter and send to preop pool. Marlyse Single, PA-C    03/03/2024 12:24 PM

## 2024-03-17 ENCOUNTER — Ambulatory Visit (INDEPENDENT_AMBULATORY_CARE_PROVIDER_SITE_OTHER): Admitting: Family Medicine

## 2024-03-18 ENCOUNTER — Other Ambulatory Visit (INDEPENDENT_AMBULATORY_CARE_PROVIDER_SITE_OTHER): Payer: Self-pay | Admitting: Family Medicine

## 2024-03-18 DIAGNOSIS — E559 Vitamin D deficiency, unspecified: Secondary | ICD-10-CM

## 2024-04-11 ENCOUNTER — Telehealth: Payer: Self-pay | Admitting: Internal Medicine

## 2024-04-11 NOTE — Telephone Encounter (Signed)
   Patient Name: Melanie Reeves  DOB: 1951/08/15 MRN: [id number]  Primary Cardiologist: Vina Gull, MD  Clinical pharmacists have reviewed the patient's past medical history, labs, and current medications as part of preoperative protocol coverage. The following recommendations have been made:  Patient does not require pre-op antibiotics for dental procedure.   Per office protocol, patient can hold Eliquis  for 1 day prior to procedure.    I will route this recommendation to the requesting party via Epic fax function and remove from pre-op pool.  Please call with questions.  Wyn Raddle, Jackee Shove, NP 04/11/2024, 4:57 PM

## 2024-04-11 NOTE — Telephone Encounter (Signed)
 Pharmacy please advise on holding Eliquis  prior to dental implants scheduled for 04/29/2024. Thank you.

## 2024-04-11 NOTE — Telephone Encounter (Signed)
   Pre-operative Risk Assessment    Patient Name: Melanie Reeves  DOB: 19-Feb-1951 MRN: [id number]   Date of last office visit: 12/24/23 Date of next office visit: Not yet scheduled  Request for Surgical Clearance    Procedure:  *Implants placed  Date of Surgery:  Clearance 04/29/24                                Surgeon:  Dr. Elsie Beagle Surgeon's Group or Practice Name:  [facility]  Phone number:  [phone]  Fax number:  [phone]   Type of Clearance Requested:   - Pharmacy:  Hold        Type of Anesthesia:  Local    Additional requests/questions:  Caller Deneen) state patient will be having implants placed.   Signed, Jasmin B Wilson   04/11/2024, 9:16 AM

## 2024-04-11 NOTE — Telephone Encounter (Signed)
 Patient with diagnosis of A Fib on Eliquis  for anticoagulation.    Procedure:  *Implants placed  Date of procedure: 04/29/24   CHA2DS2-VASc Score = 6  This indicates a 9.7% annual risk of stroke. The patient's score is based upon: CHF History: 1 HTN History: 1 Diabetes History: 1 Stroke History: 0 Vascular Disease History: 1 Age Score: 1 Gender Score: 1   CrCl 66 ml/min using adj body weight Platelet count 287K  Patient does not require pre-op antibiotics for dental procedure.  Per office protocol, patient can hold Eliquis  for 1 day prior to procedure.    Patient will not need bridging with Lovenox  (enoxaparin ) around procedure.  **This guidance is not considered finalized until pre-operative APP has relayed final recommendations.**

## 2024-04-25 ENCOUNTER — Ambulatory Visit (INDEPENDENT_AMBULATORY_CARE_PROVIDER_SITE_OTHER): Admitting: Family Medicine

## 2024-04-25 ENCOUNTER — Encounter (INDEPENDENT_AMBULATORY_CARE_PROVIDER_SITE_OTHER): Payer: Self-pay | Admitting: Family Medicine

## 2024-04-25 VITALS — BP 129/63 | HR 84 | Temp 97.5°F | Ht 61.0 in | Wt 274.0 lb

## 2024-04-25 DIAGNOSIS — E669 Obesity, unspecified: Secondary | ICD-10-CM

## 2024-04-25 DIAGNOSIS — Z794 Long term (current) use of insulin: Secondary | ICD-10-CM | POA: Diagnosis not present

## 2024-04-25 DIAGNOSIS — R600 Localized edema: Secondary | ICD-10-CM

## 2024-04-25 DIAGNOSIS — N1831 Chronic kidney disease, stage 3a: Secondary | ICD-10-CM | POA: Diagnosis not present

## 2024-04-25 DIAGNOSIS — E1122 Type 2 diabetes mellitus with diabetic chronic kidney disease: Secondary | ICD-10-CM | POA: Diagnosis not present

## 2024-04-25 DIAGNOSIS — E559 Vitamin D deficiency, unspecified: Secondary | ICD-10-CM | POA: Diagnosis not present

## 2024-04-25 DIAGNOSIS — Z6841 Body Mass Index (BMI) 40.0 and over, adult: Secondary | ICD-10-CM

## 2024-04-25 MED ORDER — VITAMIN D (ERGOCALCIFEROL) 1.25 MG (50000 UNIT) PO CAPS: 50000.0000 [IU] | ORAL_CAPSULE | ORAL | 0 refills | Status: DC

## 2024-04-25 MED ORDER — VITAMIN D (ERGOCALCIFEROL) 1.25 MG (50000 UNIT) PO CAPS: 50000.0000 [IU] | ORAL_CAPSULE | ORAL | 2 refills | Status: DC

## 2024-04-25 MED ORDER — TIRZEPATIDE 15 MG/0.5ML ~~LOC~~ SOAJ: 15.0000 mg | SUBCUTANEOUS | 0 refills | Status: DC
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
## 2024-05-15 ENCOUNTER — Other Ambulatory Visit: Payer: Self-pay | Admitting: Physician Assistant

## 2024-05-15 DIAGNOSIS — I480 Paroxysmal atrial fibrillation: Secondary | ICD-10-CM

## 2024-06-16 ENCOUNTER — Other Ambulatory Visit: Payer: Self-pay | Admitting: Cardiology

## 2024-06-20 ENCOUNTER — Other Ambulatory Visit: Payer: Self-pay | Admitting: Cardiology

## 2024-06-27 ENCOUNTER — Ambulatory Visit (INDEPENDENT_AMBULATORY_CARE_PROVIDER_SITE_OTHER): Admitting: Family Medicine

## 2024-06-27 ENCOUNTER — Encounter (INDEPENDENT_AMBULATORY_CARE_PROVIDER_SITE_OTHER): Payer: Self-pay | Admitting: Family Medicine

## 2024-06-27 VITALS — BP 124/74 | HR 84 | Temp 97.9°F | Ht 61.0 in | Wt 277.0 lb

## 2024-06-27 DIAGNOSIS — Z794 Long term (current) use of insulin: Secondary | ICD-10-CM

## 2024-06-27 DIAGNOSIS — Z6841 Body Mass Index (BMI) 40.0 and over, adult: Secondary | ICD-10-CM | POA: Diagnosis not present

## 2024-06-27 DIAGNOSIS — E66813 Obesity, class 3: Secondary | ICD-10-CM | POA: Diagnosis not present

## 2024-06-27 DIAGNOSIS — E119 Type 2 diabetes mellitus without complications: Secondary | ICD-10-CM

## 2024-06-27 DIAGNOSIS — N1831 Chronic kidney disease, stage 3a: Secondary | ICD-10-CM

## 2024-06-27 DIAGNOSIS — E559 Vitamin D deficiency, unspecified: Secondary | ICD-10-CM

## 2024-06-27 DIAGNOSIS — E669 Obesity, unspecified: Secondary | ICD-10-CM | POA: Insufficient documentation

## 2024-06-27 MED ORDER — VITAMIN D (ERGOCALCIFEROL) 1.25 MG (50000 UNIT) PO CAPS
50000.0000 [IU] | ORAL_CAPSULE | ORAL | 2 refills | Status: DC
Start: 2024-06-27 — End: 2024-08-04

## 2024-06-27 MED ORDER — TIRZEPATIDE 15 MG/0.5ML ~~LOC~~ SOAJ: 15.0000 mg | SUBCUTANEOUS | 0 refills | Status: DC

## 2024-06-27 NOTE — Progress Notes (Signed)
 Office: [phone]  /  Fax: [phone]  WEIGHT SUMMARY AND BIOMETRICS  Anthropometric Measurements Height: 5' 1 (1.549 m) Weight: 277 lb (125.6 kg) BMI (Calculated): 52.37 Weight at Last Visit: 274 lb Weight Lost Since Last Visit: 0 Weight Gained Since Last Visit: 3 lb Starting Weight: 294 lb Total Weight Loss (lbs): 17 lb (7.711 kg) Peak Weight: 330 lb   Body Composition  Body Fat %: 60.5 % Fat Mass (lbs): 167.8 lbs Muscle Mass (lbs): 104 lbs Visceral Fat Rating : 25   Other Clinical Data Fasting: yes Labs: no Today's Visit #: 71 Starting Date: 11/29/20    Chief Complaint: OBESITY   Discussed the use of AI scribe software for clinical note transcription with the patient, who gave verbal consent to proceed.  History of Present Illness Melanie Reeves is a 73 year old female with obesity and type 2 diabetes who presents for obesity treatment and progress assessment. She is accompanied by her husband.  She is on a journaling plan targeting 2000 calories and 100 or more grams of protein. Previously, she followed a category three eating plan with reduced simple carbohydrates, fruits, vegetables, and lean protein, totaling approximately 1500 calories and 100 grams of protein. She has been focusing on portion control and smart choices instead of tracking her intake. She has gained three pounds in the last two months, possibly due to consuming approximately 2200 calories.  She manages type 2 diabetes with a GFR of 57 and is currently taking Toujeo  and Mounjaro  15 mg. Her last A1c was 7.3. Her blood sugar level today is 209. She notes that her blood sugar does not spike with pork skins as it does with chips.  She experienced congestion and fluid retention, which led to a visit to another doctor who performed a BNP test. She lost 11 pounds after resuming her diuretic medication, with the first day being the most challenging due to frequent urination.  She reports vaginal  bleeding and plans to contact a triage nurse. She had a similar issue previously, which was investigated with a biopsy that showed no cancer.  Her daily routine includes often skipping meals, and she plans to incorporate a protein drink to ensure adequate protein intake. She is working on reducing portion sizes and making healthier food choices, such as eating salads and controlling dressing portions.  She experiences knee pain, particularly in the left knee, which limits her ability to walk long distances. She is cautious about overexerting herself due to fear of her knee giving out.  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      PHYSICAL EXAM:  Blood pressure 124/74, pulse 84, temperature 97.9 F (36.6 C), height 5' 1 (1.549 m), weight 277 lb (125.6 kg), SpO2 95%. Body mass index is 52.34 kg/m.  DIAGNOSTIC DATA REVIEWED:  BMET    Component Value Date/Time   NA 137 01/14/2024 1054   K 5.0 01/14/2024 1054   CL 103 01/14/2024 1054   CO2 16 (L) 01/14/2024 1054   GLUCOSE 149 (H) 01/14/2024 1054   GLUCOSE 145 (H) 09/07/2023 2330   BUN 30 (H) 01/14/2024 1054   CREATININE 1.06 (H) 01/14/2024 1054   CALCIUM 10.1 01/14/2024 1054   GFRNONAA >60 09/07/2023 2330   GFRAA >60 10/02/2016 1300   Lab Results  Component Value Date   HGBA1C 7.3 (H) 01/14/2024   HGBA1C 11.4 (A) 07/02/2015   Lab Results  Component Value Date   INSULIN  31.7 (H)  05/26/2023   INSULIN  34.8 (H) 10/16/2021   Lab Results  Component Value Date   TSH 1.217 11/24/2020   CBC    Component Value Date/Time   WBC 17.2 (H) 09/07/2023 2330   RBC 4.62 09/07/2023 2330   HGB 12.7 09/07/2023 2330   HGB 12.9 06/24/2022 1153   HCT 39.8 09/07/2023 2330   HCT 40.5 06/24/2022 1153   PLT 343 09/07/2023 2330   PLT 245 06/24/2022 1153   MCV 86.1 09/07/2023 2330   MCV 84 06/24/2022 1153   MCH 27.5 09/07/2023 2330   MCHC 31.9 09/07/2023 2330   RDW 16.0 (H) 09/07/2023 2330   RDW 14.7  06/24/2022 1153   Iron Studies    Component Value Date/Time   FERRITIN 264 10/25/2020 0634   Lipid Panel     Component Value Date/Time   CHOL 144 01/14/2024 1054   TRIG 139 01/14/2024 1054   HDL 57 01/14/2024 1054   CHOLHDL 3 07/15/2021 1054   VLDL 30.6 07/15/2021 1054   LDLCALC 63 01/14/2024 1054   LDLDIRECT 72.0 02/28/2020 1124   Hepatic Function Panel     Component Value Date/Time   PROT 7.4 01/14/2024 1054   ALBUMIN 4.4 01/14/2024 1054   AST 15 01/14/2024 1054   ALT 18 01/14/2024 1054   ALKPHOS 53 01/14/2024 1054   BILITOT <0.2 01/14/2024 1054      Component Value Date/Time   TSH 1.217 11/24/2020 0352   TSH 1.420 08/24/2017 1608   Nutritional Lab Results  Component Value Date   VD25OH 43.7 01/14/2024   VD25OH 37.9 09/24/2023   VD25OH 57.0 05/26/2023     Assessment and Plan Assessment & Plan Class 3 Obesity Class 3 obesity with recent weight gain of 3 pounds over the last two months due to deviation from the prescribed eating plan. Current intake is approximately 2200 calories, contributing to weight gain. - Encourage adherence to a 1500 calorie diet with 100 grams of protein. - Promote mindful eating and portion control. - Suggest using protein drinks as meal replacements. - Encourage healthier snack options such as cottage cheese with blueberries.  Type 2 Diabetes Mellitus Type 2 diabetes mellitus with a recent A1c of 7.3, above the target goal. Currently managed with Toujeo  and Mounjaro  15 mg. Blood sugar level was 209, potentially due to liver glucose production. Diabetes contributes to weight management challenges. - Continue Toujeo  and Mounjaro  15 mg. - Encourage use of CPAP machine for sleep apnea to aid metabolism and weight loss. - Monitor blood sugar levels regularly.  Vit D deficiency Stable on vit D - Refill vit D and follow with labs as needed.      I personally spent a total of 40 minutes in the care of the patient today including  preparing to see the patient, performing a medically appropriate evaluation of current problems, placing orders in the EMR, documenting clinical information in the EMR, customized nutritional counseling for their specific health and social needs, discussing strategies to help avoid social eating challenges, and discussing eating out strategies and ways to minimize common nutritional pitfalls.    Melanie Reeves was informed of the importance of frequent follow up visits to maximize her success with intensive lifestyle modifications for her obesity and obesity related health conditions as recommended by USPSTF and CMS guidelines   Louann Penton, MD

## 2024-08-04 ENCOUNTER — Ambulatory Visit (INDEPENDENT_AMBULATORY_CARE_PROVIDER_SITE_OTHER): Admitting: Family Medicine

## 2024-08-04 ENCOUNTER — Encounter (INDEPENDENT_AMBULATORY_CARE_PROVIDER_SITE_OTHER): Payer: Self-pay | Admitting: Family Medicine

## 2024-08-04 VITALS — BP 135/74 | HR 83 | Temp 97.5°F | Ht 61.0 in | Wt 268.0 lb

## 2024-08-04 DIAGNOSIS — E559 Vitamin D deficiency, unspecified: Secondary | ICD-10-CM

## 2024-08-04 DIAGNOSIS — Z794 Long term (current) use of insulin: Secondary | ICD-10-CM

## 2024-08-04 DIAGNOSIS — I10 Essential (primary) hypertension: Secondary | ICD-10-CM

## 2024-08-04 DIAGNOSIS — E1122 Type 2 diabetes mellitus with diabetic chronic kidney disease: Secondary | ICD-10-CM

## 2024-08-04 DIAGNOSIS — Z6841 Body Mass Index (BMI) 40.0 and over, adult: Secondary | ICD-10-CM

## 2024-08-04 DIAGNOSIS — E669 Obesity, unspecified: Secondary | ICD-10-CM

## 2024-08-04 MED ORDER — VITAMIN D (ERGOCALCIFEROL) 1.25 MG (50000 UNIT) PO CAPS: 50000.0000 [IU] | ORAL_CAPSULE | ORAL | 2 refills | Status: DC

## 2024-08-04 NOTE — Progress Notes (Signed)
 Office: [phone]  /  Fax: [phone]  WEIGHT SUMMARY AND BIOMETRICS  Anthropometric Measurements Height: 5' 1 (1.549 m) Weight: 268 lb (121.6 kg) BMI (Calculated): 50.66 Weight at Last Visit: 277 lb Weight Lost Since Last Visit: 9 lb Weight Gained Since Last Visit: 0 Starting Weight: 294 lb Total Weight Loss (lbs): 26 lb (11.8 kg) Peak Weight: 330 lb   Body Composition  Body Fat %: 59 % Fat Mass (lbs): 158.4 lbs Muscle Mass (lbs): 104.6 lbs Visceral Fat Rating : 24   Other Clinical Data Fasting: yes Labs: no Today's Visit #: 48 Starting Date: 11/29/20    Chief Complaint: OBESITY   History of Present Illness Melanie Reeves is a 73 year old female who presents for a follow-up on her obesity treatment plan and progress assessment.  She is adhering to an obesity treatment plan that includes a daily intake of 1800 calories and 100 grams of protein, achieving these goals approximately 80% of the time. She focuses on incorporating more whole foods into her diet but struggles to consistently meet the recommended protein intake. She has successfully lost 9 pounds in the past month.  She maintains adequate hydration but finds it challenging to avoid large meals and achieve seven or more hours of sleep most nights. She has made dietary changes, such as reducing snacking and avoiding the '[employer]' mentality, which she finds difficult. She has eliminated certain foods like potato chips and is working on portion control.  She experienced a scare related to potential congestive heart failure, which motivated her to adhere more strictly to her treatment plan. Her legs had started to blister, and she was not taking her diuretic medication, but her kidney function remained good, and she did not require hospitalization.  Her current medications include prescription ergocalciferol  at 50,000 international units per week for vitamin D  deficiency, and she requests a  refill. For hypertension, she is on losartan  and spironolactone . Her blood pressure was initially elevated at 144/77 but improved to 135/74 upon recheck.  She engages in regular physical activity, including caring for her grandchildren, which helps her stay active. She reports fluctuating weight, with a recent low of 269 pounds, and she tries not to weigh herself daily to avoid obsessing over the number.      PHYSICAL EXAM:  Blood pressure 135/74, pulse 83, temperature (!) 97.5 F (36.4 C), height 5' 1 (1.549 m), weight 268 lb (121.6 kg), SpO2 94%. Body mass index is 50.64 kg/m.  DIAGNOSTIC DATA REVIEWED:  BMET    Component Value Date/Time   NA 137 01/14/2024 1054   K 5.0 01/14/2024 1054   CL 103 01/14/2024 1054   CO2 16 (L) 01/14/2024 1054   GLUCOSE 149 (H) 01/14/2024 1054   GLUCOSE 145 (H) 09/07/2023 2330   BUN 30 (H) 01/14/2024 1054   CREATININE 1.06 (H) 01/14/2024 1054   CALCIUM 10.1 01/14/2024 1054   GFRNONAA >60 09/07/2023 2330   GFRAA >60 10/02/2016 1300   Lab Results  Component Value Date   HGBA1C 7.3 (H) 01/14/2024   HGBA1C 11.4 (A) 07/02/2015   Lab Results  Component Value Date   INSULIN  31.7 (H) 05/26/2023   INSULIN  34.8 (H) 10/16/2021   Lab Results  Component Value Date   TSH 1.217 11/24/2020   CBC    Component Value Date/Time   WBC 17.2 (H) 09/07/2023 2330   RBC 4.62 09/07/2023 2330   HGB 12.7 09/07/2023 2330   HGB 12.9 06/24/2022 1153  HCT 39.8 09/07/2023 2330   HCT 40.5 06/24/2022 1153   PLT 343 09/07/2023 2330   PLT 245 06/24/2022 1153   MCV 86.1 09/07/2023 2330   MCV 84 06/24/2022 1153   MCH 27.5 09/07/2023 2330   MCHC 31.9 09/07/2023 2330   RDW 16.0 (H) 09/07/2023 2330   RDW 14.7 06/24/2022 1153   Iron Studies    Component Value Date/Time   FERRITIN 264 10/25/2020 0634   Lipid Panel     Component Value Date/Time   CHOL 144 01/14/2024 1054   TRIG 139 01/14/2024 1054   HDL 57 01/14/2024 1054   CHOLHDL 3 07/15/2021 1054   VLDL  30.6 07/15/2021 1054   LDLCALC 63 01/14/2024 1054   LDLDIRECT 72.0 02/28/2020 1124   Hepatic Function Panel     Component Value Date/Time   PROT 7.4 01/14/2024 1054   ALBUMIN 4.4 01/14/2024 1054   AST 15 01/14/2024 1054   ALT 18 01/14/2024 1054   ALKPHOS 53 01/14/2024 1054   BILITOT <0.2 01/14/2024 1054      Component Value Date/Time   TSH 1.217 11/24/2020 0352   TSH 1.420 08/24/2017 1608   Nutritional Lab Results  Component Value Date   VD25OH 43.7 01/14/2024   VD25OH 37.9 09/24/2023   VD25OH 57.0 05/26/2023     Assessment and Plan Assessment & Plan Obesity Obesity management is ongoing with a focus on dietary changes and weight loss. She has lost nine pounds in the last month. She is following a journaling plan with a goal of 1800 calories and 100 grams of protein daily, achieving this about 80% of the time. She is working on reducing simple carbohydrates and decreasing consumption of chips, pork rinds, and [place] fries. She is also focusing on portion control and not being part of the 'Clean Your [employer]'. - Continue journaling plan with 1800 calories and 100 grams of protein daily - Work on decreasing simple carbohydrates - Decrease consumption of chips, pork rinds, and [place] fries - Follow up in four weeks  Essential hypertension Hypertension is being managed with losartan  and spironolactone . Blood pressure was initially elevated at 144/77 but improved to 135/74 upon recheck. Continued focus on diet, exercise, and weight loss is part of the management plan. - Continue losartan  and spironolactone  - Continue working on diet, exercise, and weight loss  Vitamin D  deficiency Vitamin D  deficiency is being treated with ergocalciferol  50,000 IU weekly. She requests a refill and prefers a monthly prescription due to insurance requirements. - Refill ergocalciferol  50,000 IU with a monthly prescription      Melanie Reeves was informed of the importance of frequent follow up  visits to maximize her success with intensive lifestyle modifications for her obesity and obesity related health conditions as recommended by USPSTF and CMS guidelines   Louann Penton, MD

## 2024-08-06 ENCOUNTER — Other Ambulatory Visit: Payer: Self-pay | Admitting: Internal Medicine

## 2024-08-06 DIAGNOSIS — I480 Paroxysmal atrial fibrillation: Secondary | ICD-10-CM

## 2024-08-08 NOTE — Telephone Encounter (Signed)
 Eliquis  5mg  refill request received. Patient is 73 years old, weight-121.6kg, Crea-1.06 on 01/14/24, Diagnosis-Afib, and last seen by Orren Fabry on 12/24/23. Dose is appropriate based on dosing criteria. Will send in refill to requested pharmacy.

## 2024-09-01 ENCOUNTER — Encounter (INDEPENDENT_AMBULATORY_CARE_PROVIDER_SITE_OTHER): Payer: Self-pay | Admitting: Family Medicine

## 2024-09-01 ENCOUNTER — Ambulatory Visit (INDEPENDENT_AMBULATORY_CARE_PROVIDER_SITE_OTHER): Payer: Self-pay | Admitting: Family Medicine

## 2024-09-01 VITALS — BP 109/66 | HR 82 | Temp 97.3°F | Ht 61.0 in | Wt 265.0 lb

## 2024-09-01 DIAGNOSIS — E559 Vitamin D deficiency, unspecified: Secondary | ICD-10-CM | POA: Diagnosis not present

## 2024-09-01 DIAGNOSIS — Z6841 Body Mass Index (BMI) 40.0 and over, adult: Secondary | ICD-10-CM | POA: Diagnosis not present

## 2024-09-01 DIAGNOSIS — E669 Obesity, unspecified: Secondary | ICD-10-CM

## 2024-09-01 DIAGNOSIS — E119 Type 2 diabetes mellitus without complications: Secondary | ICD-10-CM | POA: Diagnosis not present

## 2024-09-01 DIAGNOSIS — Z794 Long term (current) use of insulin: Secondary | ICD-10-CM

## 2024-09-01 DIAGNOSIS — Z7985 Long-term (current) use of injectable non-insulin antidiabetic drugs: Secondary | ICD-10-CM

## 2024-09-01 MED ORDER — VITAMIN D (ERGOCALCIFEROL) 1.25 MG (50000 UNIT) PO CAPS: 50000.0000 [IU] | ORAL_CAPSULE | ORAL | 0 refills | Status: DC

## 2024-09-01 NOTE — Progress Notes (Signed)
 Office: [phone]  /  Fax: [phone]  WEIGHT SUMMARY AND BIOMETRICS  Anthropometric Measurements Height: 5' 1 (1.549 m) Weight: 265 lb (120.2 kg) BMI (Calculated): 50.1 Weight at Last Visit: 268 lb Weight Lost Since Last Visit: 3 lb Weight Gained Since Last Visit: 0 Starting Weight: 294 lb Total Weight Loss (lbs): 29 lb (13.2 kg) Peak Weight: 330 lb   Body Composition  Body Fat %: 57.9 % Fat Mass (lbs): 153.4 lbs Muscle Mass (lbs): 106 lbs Visceral Fat Rating : 24   Other Clinical Data Fasting: no Labs: no Today's Visit #: 49 Starting Date: 11/29/20    Chief Complaint: OBESITY     History of Present Illness Melanie Reeves is a 73 year old female with obesity and type 2 diabetes who presents for obesity treatment and progress assessment. She is accompanied by her husband.  She is on a dietary plan of 1800 calories and 100 grams of protein daily but is not adhering to journaling, meeting protein intake, or hydrating adequately. She often skips meals and does not achieve the recommended 7 to 9 hours of sleep per night. She is not engaging in regular physical activity and uses a wheelchair.  She has type 2 diabetes and is prescribed Toujeo  and Mounjaro , with the latter at a dose of 15 mg weekly. Her hemoglobin A1c increased from 7.3% in April to 7.9% in August. Blood sugar levels range from 107 to 250 mg/dL. She takes 80 units of Toujeo  Max daily, a stable dose.  She faces dietary challenges, such as insufficient vegetable intake and occasional meal skipping. She is attempting to incorporate more fruits and vegetables and increase protein consumption. Recently, she had a big salad, vegetable beef soup, and is trying to spread out meals, including eating flounder and shrimp on different days.  She experiences knee pain, limiting mobility and causing caution to avoid falls. She tries to move as needed but is restricted by knee discomfort. She recalls a past knee  injection that provided relief.  She mentions her Mounjaro  injections, noting a small bubble sometimes forms at the injection site. She follows the instruction to hold the injection for ten seconds after the second click.      PHYSICAL EXAM:  Blood pressure 109/66, pulse 82, temperature (!) 97.3 F (36.3 C), height 5' 1 (1.549 m), weight 265 lb (120.2 kg), SpO2 94%. Body mass index is 50.07 kg/m.  DIAGNOSTIC DATA REVIEWED:  BMET    Component Value Date/Time   NA 137 01/14/2024 1054   K 5.0 01/14/2024 1054   CL 103 01/14/2024 1054   CO2 16 (L) 01/14/2024 1054   GLUCOSE 149 (H) 01/14/2024 1054   GLUCOSE 145 (H) 09/07/2023 2330   BUN 30 (H) 01/14/2024 1054   CREATININE 1.06 (H) 01/14/2024 1054   CALCIUM 10.1 01/14/2024 1054   GFRNONAA >60 09/07/2023 2330   GFRAA >60 10/02/2016 1300   Lab Results  Component Value Date   HGBA1C 7.3 (H) 01/14/2024   HGBA1C 11.4 (A) 07/02/2015   Lab Results  Component Value Date   INSULIN  31.7 (H) 05/26/2023   INSULIN  34.8 (H) 10/16/2021   Lab Results  Component Value Date   TSH 1.217 11/24/2020   CBC    Component Value Date/Time   WBC 17.2 (H) 09/07/2023 2330   RBC 4.62 09/07/2023 2330   HGB 12.7 09/07/2023 2330   HGB 12.9 06/24/2022 1153   HCT 39.8 09/07/2023 2330   HCT 40.5 06/24/2022 1153   PLT 343  09/07/2023 2330   PLT 245 06/24/2022 1153   MCV 86.1 09/07/2023 2330   MCV 84 06/24/2022 1153   MCH 27.5 09/07/2023 2330   MCHC 31.9 09/07/2023 2330   RDW 16.0 (H) 09/07/2023 2330   RDW 14.7 06/24/2022 1153   Iron Studies    Component Value Date/Time   FERRITIN 264 10/25/2020 0634   Lipid Panel     Component Value Date/Time   CHOL 144 01/14/2024 1054   TRIG 139 01/14/2024 1054   HDL 57 01/14/2024 1054   CHOLHDL 3 07/15/2021 1054   VLDL 30.6 07/15/2021 1054   LDLCALC 63 01/14/2024 1054   LDLDIRECT 72.0 02/28/2020 1124   Hepatic Function Panel     Component Value Date/Time   PROT 7.4 01/14/2024 1054   ALBUMIN  4.4 01/14/2024 1054   AST 15 01/14/2024 1054   ALT 18 01/14/2024 1054   ALKPHOS 53 01/14/2024 1054   BILITOT <0.2 01/14/2024 1054      Component Value Date/Time   TSH 1.217 11/24/2020 0352   TSH 1.420 08/24/2017 1608   Nutritional Lab Results  Component Value Date   VD25OH 43.7 01/14/2024   VD25OH 37.9 09/24/2023   VD25OH 57.0 05/26/2023     Assessment and Plan Assessment & Plan Obesity She is on a 1800 calorie diet with increased protein intake but is not journaling her food intake. She is not meeting the recommended protein intake, is not hydrating adequately, and is skipping meals. She is not exercising due to knee pain and is in a wheelchair. She has lost 3 pounds recently. - Continue 1800 calorie diet with increased protein intake. - Encouraged journaling of food intake. - Encouraged regular meal consumption. - Encouraged safe physical activity as tolerated.  Type 2 diabetes mellitus Recent hemoglobin A1c of 7.9 in August, indicating suboptimal control. She is on Toujeo  80 units daily and Mounjaro  15 mg weekly. Blood sugars range from 107 to 250 mg/dL. She is not experiencing hypoglycemia. She is learning to manage her diet to improve glycemic control. - Continue Toujeo  80 units daily. - Continue Mounjaro  15 mg weekly. - Monitor blood glucose levels regularly. - Encouraged dietary modifications to reduce carbohydrate intake and increase protein intake. - Ordered hemoglobin A1c test for December 1st.  Vitamin D  deficiency She requires regular monitoring to ensure levels remain within a safe range. - Refilled vitamin D  prescription for 30 days. - Ordered vitamin D  level test.      Patients who are on anti-obesity medications are counseled on the importance of maintaining healthy lifestyle habits, including balanced nutrition, regular physical activity, and behavioral modifications,  Medication is an adjunct to, not a replacement for, lifestyle changes and that the  long-term success and weight maintenance depend on continued adherence to these strategies.   Melanie Reeves was informed of the importance of frequent follow up visits to maximize her success with intensive lifestyle modifications for her obesity and obesity related health conditions as recommended by USPSTF and CMS guidelines   Louann Penton, MD

## 2024-09-02 ENCOUNTER — Other Ambulatory Visit: Payer: Self-pay | Admitting: Internal Medicine

## 2024-09-02 LAB — VITAMIN D 25 HYDROXY (VIT D DEFICIENCY, FRACTURES): Vit D, 25-Hydroxy: 42.7 ng/mL (ref 30.0–100.0)

## 2024-10-12 ENCOUNTER — Ambulatory Visit (INDEPENDENT_AMBULATORY_CARE_PROVIDER_SITE_OTHER): Payer: Self-pay | Admitting: Family Medicine

## 2024-10-18 ENCOUNTER — Encounter (INDEPENDENT_AMBULATORY_CARE_PROVIDER_SITE_OTHER): Payer: Self-pay | Admitting: Family Medicine

## 2024-10-18 ENCOUNTER — Other Ambulatory Visit (INDEPENDENT_AMBULATORY_CARE_PROVIDER_SITE_OTHER): Payer: Self-pay | Admitting: Family Medicine

## 2024-10-18 ENCOUNTER — Ambulatory Visit (INDEPENDENT_AMBULATORY_CARE_PROVIDER_SITE_OTHER): Admitting: Family Medicine

## 2024-10-18 VITALS — BP 100/67 | HR 86 | Temp 97.7°F | Ht 61.0 in | Wt 266.0 lb

## 2024-10-18 DIAGNOSIS — Z6841 Body Mass Index (BMI) 40.0 and over, adult: Secondary | ICD-10-CM | POA: Diagnosis not present

## 2024-10-18 DIAGNOSIS — Z794 Long term (current) use of insulin: Secondary | ICD-10-CM | POA: Diagnosis not present

## 2024-10-18 DIAGNOSIS — E559 Vitamin D deficiency, unspecified: Secondary | ICD-10-CM | POA: Diagnosis not present

## 2024-10-18 DIAGNOSIS — N1831 Chronic kidney disease, stage 3a: Secondary | ICD-10-CM

## 2024-10-18 DIAGNOSIS — E1122 Type 2 diabetes mellitus with diabetic chronic kidney disease: Secondary | ICD-10-CM | POA: Diagnosis not present

## 2024-10-18 DIAGNOSIS — Z7985 Long-term (current) use of injectable non-insulin antidiabetic drugs: Secondary | ICD-10-CM

## 2024-10-18 MED ORDER — VITAMIN D (ERGOCALCIFEROL) 1.25 MG (50000 UNIT) PO CAPS: 50000.0000 [IU] | ORAL_CAPSULE | ORAL | 0 refills | Status: AC

## 2024-10-18 MED ORDER — TIRZEPATIDE 15 MG/0.5ML ~~LOC~~ SOAJ: 15.0000 mg | SUBCUTANEOUS | 0 refills | Status: AC

## 2024-10-18 NOTE — Progress Notes (Signed)
 "  Office: [phone]  /  Fax: [phone]  WEIGHT SUMMARY AND BIOMETRICS  Anthropometric Measurements Height: 5' 1 (1.549 m) Weight: 266 lb (120.7 kg) BMI (Calculated): 50.29 Weight at Last Visit: 265 lb Weight Lost Since Last Visit: 0 Weight Gained Since Last Visit: 1 lb Starting Weight: 294 lb Total Weight Loss (lbs): 28 lb (12.7 kg) Peak Weight: 330 lb   Body Composition  Body Fat %: 57.9 % Fat Mass (lbs): 154.4 lbs Muscle Mass (lbs): 106.6 lbs Visceral Fat Rating : 24   Other Clinical Data Fasting: yes Labs: no Today's Visit #: 50 Starting Date: 11/29/20 Comments: 1800+100    Chief Complaint: OBESITY   History of Present Illness Melanie Reeves is a 74 year old female with obesity and diabetes who presents for obesity treatment and progress assessment.  She is following a dietary plan of 1800 calories with an emphasis on 100 grams of protein but estimates adherence at about 40%. She has increased her consumption of fruits and vegetables but is not meeting her protein goals. She has gained one pound over the last six weeks, including the Thanksgiving and Christmas holidays. She is not engaging in regular exercise but has been more active recently due to caring for a baby, which involves increased physical activity such as getting up frequently and using a stroller.  She is being treated for vitamin D  deficiency with prescription ergocalciferol  50,000 IU per week. Her diabetes management regimen includes Mounjaro  15 mg weekly, Toujeo , and Xigduo XR. She experiences spikes in blood sugar after eating. She drinks a lot of water, even during the night, and sometimes feels she drinks too much.  Reflecting on her weight management journey, she notes a significant change in her perspective on food. She used to weigh 305 pounds and now weighs 266 pounds. Although she hasn't lost much weight recently, she has shifted from indulging in fast food and snacks to eating healthier,  which has also helped her save money and reduce sodium intake. She feels accountable for her progress.      PHYSICAL EXAM:  Blood pressure 100/67, pulse 86, temperature 97.7 F (36.5 C), height 5' 1 (1.549 m), weight 266 lb (120.7 kg), SpO2 93%. Body mass index is 50.26 kg/m.  DIAGNOSTIC DATA REVIEWED:  BMET    Component Value Date/Time   NA 137 01/14/2024 1054   K 5.0 01/14/2024 1054   CL 103 01/14/2024 1054   CO2 16 (L) 01/14/2024 1054   GLUCOSE 149 (H) 01/14/2024 1054   GLUCOSE 145 (H) 09/07/2023 2330   BUN 30 (H) 01/14/2024 1054   CREATININE 1.06 (H) 01/14/2024 1054   CALCIUM 10.1 01/14/2024 1054   GFRNONAA >60 09/07/2023 2330   GFRAA >60 10/02/2016 1300   Lab Results  Component Value Date   HGBA1C 7.3 (H) 01/14/2024   HGBA1C 11.4 (A) 07/02/2015   Lab Results  Component Value Date   INSULIN  31.7 (H) 05/26/2023   INSULIN  34.8 (H) 10/16/2021   Lab Results  Component Value Date   TSH 1.217 11/24/2020   CBC    Component Value Date/Time   WBC 17.2 (H) 09/07/2023 2330   RBC 4.62 09/07/2023 2330   HGB 12.7 09/07/2023 2330   HGB 12.9 06/24/2022 1153   HCT 39.8 09/07/2023 2330   HCT 40.5 06/24/2022 1153   PLT 343 09/07/2023 2330   PLT 245 06/24/2022 1153   MCV 86.1 09/07/2023 2330   MCV 84 06/24/2022 1153   MCH 27.5 09/07/2023 2330  MCHC 31.9 09/07/2023 2330   RDW 16.0 (H) 09/07/2023 2330   RDW 14.7 06/24/2022 1153   Iron Studies    Component Value Date/Time   FERRITIN 264 10/25/2020 0634   Lipid Panel     Component Value Date/Time   CHOL 144 01/14/2024 1054   TRIG 139 01/14/2024 1054   HDL 57 01/14/2024 1054   CHOLHDL 3 07/15/2021 1054   VLDL 30.6 07/15/2021 1054   LDLCALC 63 01/14/2024 1054   LDLDIRECT 72.0 02/28/2020 1124   Hepatic Function Panel     Component Value Date/Time   PROT 7.4 01/14/2024 1054   ALBUMIN 4.4 01/14/2024 1054   AST 15 01/14/2024 1054   ALT 18 01/14/2024 1054   ALKPHOS 53 01/14/2024 1054   BILITOT <0.2  01/14/2024 1054      Component Value Date/Time   TSH 1.217 11/24/2020 0352   TSH 1.420 08/24/2017 1608   Nutritional Lab Results  Component Value Date   VD25OH 42.7 09/01/2024   VD25OH 43.7 01/14/2024   VD25OH 37.9 09/24/2023     Assessment and Plan Assessment & Plan Morbid obesity Weight gain of 1 pound over the last six weeks, including holidays. She is not journaling as prescribed but feels she is following the plan about 40% of the time. She is eating more fruits and vegetables but not meeting protein goals. Not currently exercising. Despite indulgences, she has maintained weight better than in the past. She has lost weight from 305 to 266 pounds over four years, which is significant given her diabetes. - Provided list of recipes to help with calorie and protein intake - Calorie goal of 1800 and 100 Gm protein prescribed with actual journaling for best results. - Encouraged meal prepping and freezing meals for convenience, handout of lower calorie high protein and vegetables given today. - Continue monthly visits for accountability  Type 2 diabetes mellitus with stage 3a chronic kidney disease Diabetes managed with Mounjaro  15 mg weekly, Toujeo , and Xigduo XR. She reports sugar spikes after eating. Endocrinologist advised that without Xiidra, more insulin  would be needed. She is hydrating well to prevent urinary tract infections. - Refilled Mounjaro  prescription - Continue current diabetes medications - Encouraged hydration to prevent urinary tract infections - Continue diet, exercise and weight loss as discussed today as an important part of the treatment plan   Vitamin D  deficiency Managed with prescription ergocalciferol  50,000 IU weekly. She requests a refill. - Refilled ergocalciferol  prescription      Patients who are on anti-obesity medications are counseled on the importance of maintaining healthy lifestyle habits, including balanced nutrition, regular physical  activity, and behavioral modifications,  Medication is an adjunct to, not a replacement for, lifestyle changes and that the long-term success and weight maintenance depend on continued adherence to these strategies.   Siddalee was informed of the importance of frequent follow up visits to maximize her success with intensive lifestyle modifications for her obesity and obesity related health conditions as recommended by USPSTF and CMS guidelines  Louann Penton, MD   "

## 2024-11-04 ENCOUNTER — Telehealth: Payer: Self-pay | Admitting: Internal Medicine

## 2024-11-04 NOTE — Telephone Encounter (Signed)
 Pt c/o medication issue:  1. Name of Medication:   ELIQUIS  5 MG TABS tablet   2. How are you currently taking this medication (dosage and times per day)?   As prescribed  3. Are you having a reaction (difficulty breathing--STAT)?   4. What is your medication issue?   Patient stated she changed insurance plans and this medication is now too expensive for her.  Patient wants to get alternate medication.

## 2024-11-07 ENCOUNTER — Telehealth: Payer: Self-pay | Admitting: Pharmacy Technician

## 2024-11-07 ENCOUNTER — Other Ambulatory Visit (HOSPITAL_COMMUNITY): Payer: Self-pay

## 2024-11-07 NOTE — Telephone Encounter (Signed)
" ° °  Had to leave a message on her voicemail "

## 2024-11-07 NOTE — Telephone Encounter (Signed)
 Please share copays with patient Looks like Xarelto  is lowest price but quote all She does not have any dx that would allow for a grant

## 2024-11-07 NOTE — Telephone Encounter (Signed)
 Ran test claim for eliquis . For a 30 day supply and the co-pay is 62.16-that is her coinsurance . PA is not needed at this time. This test claim was processed through [facility] ([place] copay amounts may vary at other pharmacies due to pharmacy/plan contracts, or as the patient moves through the different stages of their insurance plan.     Ran test claim for xarelto . For a 30 day supply and the co-pay is 51.66-that is her coinsurance . PA is not needed at this time. This test claim was processed through [facility]- copay amounts may vary at other pharmacies due to pharmacy/plan contracts, or as the patient moves through the different stages of their insurance plan.     Ran test claim for dabigatran. For a 30 day supply and the co-pay is 63.43-coinsurance or 55.37 on goodrx  . PA is not needed at this time. This test claim was processed through [facility]- copay amounts may vary at other pharmacies due to pharmacy/plan contracts, or as the patient moves through the different stages of their insurance plan.

## 2024-11-07 NOTE — Telephone Encounter (Signed)
 Tried to call the patient but Had to leave a message on her voicemail

## 2024-11-08 NOTE — Telephone Encounter (Signed)
 Left another message.

## 2024-11-09 NOTE — Telephone Encounter (Signed)
 I spoke to the patient and she would like to try for assistance through [employer] for xarelto . She said all the options are unaffordable for her unless she gets the assistance. Can someone please fill out the xarelto  provider application that is scanned in media and fax to [phone]? Thank you!   Patient portion was mailed to the patient 11/09/24

## 2024-11-10 ENCOUNTER — Ambulatory Visit (INDEPENDENT_AMBULATORY_CARE_PROVIDER_SITE_OTHER): Admitting: Family Medicine

## 2024-11-10 NOTE — Telephone Encounter (Signed)
 Patient's med list says she takes Eliquis  5 mg.

## 2024-11-10 NOTE — Telephone Encounter (Signed)
 Will she be changing to Xarelto  20 mg.Dr.Ross signed patient assistance.I will fax her portion.

## 2024-11-10 NOTE — Telephone Encounter (Signed)
 Spoke to patient advised when she receives her portion of xarelto  patient assistance.She will complete and bring back to office along with proof of her income.Advised when I receive I will fax all forms to [employer] patient assistance.

## 2024-11-13 ENCOUNTER — Observation Stay (HOSPITAL_COMMUNITY)
Admission: EM | Admit: 2024-11-13 | Discharge: 2024-11-15 | Disposition: A | Attending: Family Medicine | Admitting: Family Medicine

## 2024-11-13 ENCOUNTER — Other Ambulatory Visit: Payer: Self-pay

## 2024-11-13 ENCOUNTER — Emergency Department (HOSPITAL_COMMUNITY)

## 2024-11-13 ENCOUNTER — Encounter (HOSPITAL_COMMUNITY): Payer: Self-pay | Admitting: Emergency Medicine

## 2024-11-13 DIAGNOSIS — R531 Weakness: Secondary | ICD-10-CM | POA: Diagnosis not present

## 2024-11-13 DIAGNOSIS — I480 Paroxysmal atrial fibrillation: Secondary | ICD-10-CM | POA: Diagnosis present

## 2024-11-13 DIAGNOSIS — E66813 Obesity, class 3: Secondary | ICD-10-CM | POA: Insufficient documentation

## 2024-11-13 DIAGNOSIS — E1165 Type 2 diabetes mellitus with hyperglycemia: Secondary | ICD-10-CM | POA: Diagnosis not present

## 2024-11-13 DIAGNOSIS — Z794 Long term (current) use of insulin: Secondary | ICD-10-CM

## 2024-11-13 DIAGNOSIS — I10 Essential (primary) hypertension: Secondary | ICD-10-CM | POA: Diagnosis not present

## 2024-11-13 DIAGNOSIS — G4733 Obstructive sleep apnea (adult) (pediatric): Secondary | ICD-10-CM | POA: Diagnosis present

## 2024-11-13 DIAGNOSIS — Z6841 Body Mass Index (BMI) 40.0 and over, adult: Secondary | ICD-10-CM | POA: Insufficient documentation

## 2024-11-13 DIAGNOSIS — N390 Urinary tract infection, site not specified: Secondary | ICD-10-CM

## 2024-11-13 DIAGNOSIS — Z79899 Other long term (current) drug therapy: Secondary | ICD-10-CM | POA: Insufficient documentation

## 2024-11-13 DIAGNOSIS — D696 Thrombocytopenia, unspecified: Secondary | ICD-10-CM | POA: Insufficient documentation

## 2024-11-13 DIAGNOSIS — N3000 Acute cystitis without hematuria: Principal | ICD-10-CM | POA: Diagnosis present

## 2024-11-13 DIAGNOSIS — I130 Hypertensive heart and chronic kidney disease with heart failure and stage 1 through stage 4 chronic kidney disease, or unspecified chronic kidney disease: Secondary | ICD-10-CM | POA: Insufficient documentation

## 2024-11-13 DIAGNOSIS — I5032 Chronic diastolic (congestive) heart failure: Secondary | ICD-10-CM | POA: Diagnosis present

## 2024-11-13 DIAGNOSIS — E119 Type 2 diabetes mellitus without complications: Secondary | ICD-10-CM | POA: Insufficient documentation

## 2024-11-13 DIAGNOSIS — E1122 Type 2 diabetes mellitus with diabetic chronic kidney disease: Secondary | ICD-10-CM | POA: Insufficient documentation

## 2024-11-13 DIAGNOSIS — Z7901 Long term (current) use of anticoagulants: Secondary | ICD-10-CM | POA: Insufficient documentation

## 2024-11-13 DIAGNOSIS — Z87891 Personal history of nicotine dependence: Secondary | ICD-10-CM | POA: Insufficient documentation

## 2024-11-13 DIAGNOSIS — N183 Chronic kidney disease, stage 3 unspecified: Secondary | ICD-10-CM | POA: Insufficient documentation

## 2024-11-13 DIAGNOSIS — D72829 Elevated white blood cell count, unspecified: Secondary | ICD-10-CM | POA: Insufficient documentation

## 2024-11-13 LAB — BASIC METABOLIC PANEL WITH GFR
Anion gap: 16 — ABNORMAL HIGH (ref 5–15)
BUN: 34 mg/dL — ABNORMAL HIGH (ref 8–23)
CO2: 19 mmol/L — ABNORMAL LOW (ref 22–32)
Calcium: 9 mg/dL (ref 8.9–10.3)
Chloride: 102 mmol/L (ref 98–111)
Creatinine, Ser: 1.24 mg/dL — ABNORMAL HIGH (ref 0.44–1.00)
GFR, Estimated: 46 mL/min — ABNORMAL LOW
Glucose, Bld: 243 mg/dL — ABNORMAL HIGH (ref 70–99)
Potassium: 4.4 mmol/L (ref 3.5–5.1)
Sodium: 136 mmol/L (ref 135–145)

## 2024-11-13 LAB — CBC
HCT: 33.3 % — ABNORMAL LOW (ref 36.0–46.0)
Hemoglobin: 10.8 g/dL — ABNORMAL LOW (ref 12.0–15.0)
MCH: 28.1 pg (ref 26.0–34.0)
MCHC: 32.4 g/dL (ref 30.0–36.0)
MCV: 86.7 fL (ref 80.0–100.0)
Platelets: 160 10*3/uL (ref 150–400)
RBC: 3.84 MIL/uL — ABNORMAL LOW (ref 3.87–5.11)
RDW: 15.6 % — ABNORMAL HIGH (ref 11.5–15.5)
WBC: 16.1 10*3/uL — ABNORMAL HIGH (ref 4.0–10.5)
nRBC: 0 % (ref 0.0–0.2)

## 2024-11-13 LAB — BETA-HYDROXYBUTYRIC ACID: Beta-Hydroxybutyric Acid: 0.8 mmol/L — ABNORMAL HIGH (ref 0.05–0.27)

## 2024-11-13 LAB — URINALYSIS, W/ REFLEX TO CULTURE (INFECTION SUSPECTED)
Bilirubin Urine: NEGATIVE
Glucose, UA: >=500 mg/dL — AB
Ketones, ur: NEGATIVE mg/dL
Nitrite: NEGATIVE
Protein, ur: NEGATIVE mg/dL
Specific Gravity, Urine: 1.016 (ref 1.005–1.030)
WBC, UA: >50 WBC/hpf (ref 0–5)
pH: 5 (ref 5.0–8.0)

## 2024-11-13 LAB — RESP PANEL BY RT-PCR (RSV, FLU A&B, COVID)  RVPGX2
Influenza A by PCR: NEGATIVE
Influenza B by PCR: NEGATIVE
Resp Syncytial Virus by PCR: NEGATIVE
SARS Coronavirus 2 by RT PCR: NEGATIVE

## 2024-11-13 LAB — CBG MONITORING, ED: Glucose-Capillary: 217 mg/dL — ABNORMAL HIGH (ref 70–99)

## 2024-11-13 LAB — LACTIC ACID, PLASMA: Lactic Acid, Venous: 1.1 mmol/L (ref 0.5–1.9)

## 2024-11-13 LAB — PRO BRAIN NATRIURETIC PEPTIDE: Pro Brain Natriuretic Peptide: 404 pg/mL — ABNORMAL HIGH

## 2024-11-13 MED ORDER — INSULIN GLARGINE-YFGN 100 UNIT/ML ~~LOC~~ SOLN
60.0000 [IU] | Freq: Every day | SUBCUTANEOUS | Status: DC
  Administered 2024-11-14 – 2024-11-15 (×2): 60 [IU] via SUBCUTANEOUS
  Filled 2024-11-13 (×4): qty 0.6

## 2024-11-13 MED ORDER — HYDROCODONE-ACETAMINOPHEN 5-325 MG PO TABS
1.0000 | ORAL_TABLET | Freq: Four times a day (QID) | ORAL | Status: DC | PRN
  Administered 2024-11-14 (×2): 1 via ORAL
  Filled 2024-11-13 (×2): qty 1

## 2024-11-13 MED ORDER — SODIUM CHLORIDE 0.9 % IV SOLN
2.0000 g | Freq: Once | INTRAVENOUS | Status: AC
  Administered 2024-11-13: 2 g via INTRAVENOUS
  Filled 2024-11-13: qty 20

## 2024-11-13 MED ORDER — ACETAMINOPHEN 325 MG PO TABS
650.0000 mg | ORAL_TABLET | Freq: Once | ORAL | Status: AC
  Administered 2024-11-13: 650 mg via ORAL
  Filled 2024-11-13: qty 2

## 2024-11-13 MED ORDER — INSULIN ASPART 100 UNIT/ML IJ SOLN
0.0000 [IU] | Freq: Three times a day (TID) | INTRAMUSCULAR | Status: DC
  Administered 2024-11-14: 5 [IU] via SUBCUTANEOUS
  Administered 2024-11-14: 3 [IU] via SUBCUTANEOUS
  Administered 2024-11-14: 2 [IU] via SUBCUTANEOUS
  Administered 2024-11-15 (×2): 3 [IU] via SUBCUTANEOUS
  Filled 2024-11-13: qty 1
  Filled 2024-11-13: qty 3
  Filled 2024-11-13: qty 1
  Filled 2024-11-13 (×2): qty 3

## 2024-11-13 MED ORDER — APIXABAN 5 MG PO TABS
5.0000 mg | ORAL_TABLET | Freq: Two times a day (BID) | ORAL | Status: DC
  Administered 2024-11-13 – 2024-11-15 (×4): 5 mg via ORAL
  Filled 2024-11-13 (×4): qty 1

## 2024-11-13 MED ORDER — ONDANSETRON HCL 4 MG PO TABS
4.0000 mg | ORAL_TABLET | Freq: Four times a day (QID) | ORAL | Status: DC | PRN
  Administered 2024-11-14: 4 mg via ORAL
  Filled 2024-11-13: qty 1

## 2024-11-13 MED ORDER — INSULIN ASPART 100 UNIT/ML IJ SOLN
0.0000 [IU] | Freq: Every day | INTRAMUSCULAR | Status: DC
  Administered 2024-11-13 – 2024-11-14 (×2): 2 [IU] via SUBCUTANEOUS
  Filled 2024-11-13: qty 1
  Filled 2024-11-13: qty 2

## 2024-11-13 MED ORDER — HYDROCODONE-ACETAMINOPHEN 5-325 MG PO TABS: 1.0000 | ORAL_TABLET | Freq: Once | ORAL | Status: DC

## 2024-11-13 MED ORDER — ONDANSETRON HCL 4 MG/2ML IJ SOLN: 4.0000 mg | Freq: Four times a day (QID) | INTRAMUSCULAR | Status: DC | PRN

## 2024-11-13 MED ORDER — TRAZODONE HCL 50 MG PO TABS
100.0000 mg | ORAL_TABLET | Freq: Every day | ORAL | Status: DC
  Administered 2024-11-13 – 2024-11-14 (×2): 100 mg via ORAL
  Filled 2024-11-13: qty 2
  Filled 2024-11-13: qty 1

## 2024-11-13 MED ORDER — ACETAMINOPHEN 325 MG PO TABS: 650.0000 mg | ORAL_TABLET | Freq: Four times a day (QID) | ORAL | Status: DC | PRN

## 2024-11-13 MED ORDER — FUROSEMIDE 40 MG PO TABS: 40.0000 mg | ORAL_TABLET | Freq: Every day | ORAL | Status: DC | PRN

## 2024-11-13 MED ORDER — SODIUM CHLORIDE 0.9 % IV SOLN
2.0000 g | INTRAVENOUS | Status: DC
  Administered 2024-11-14: 2 g via INTRAVENOUS
  Filled 2024-11-13: qty 20

## 2024-11-13 MED ORDER — ACETAMINOPHEN 650 MG RE SUPP: 650.0000 mg | Freq: Four times a day (QID) | RECTAL | Status: DC | PRN

## 2024-11-13 MED ORDER — IBUPROFEN 400 MG PO TABS
400.0000 mg | ORAL_TABLET | Freq: Once | ORAL | Status: AC
  Administered 2024-11-13: 400 mg via ORAL
  Filled 2024-11-13: qty 1

## 2024-11-13 MED ORDER — SODIUM CHLORIDE 0.9 % IV BOLUS
1000.0000 mL | Freq: Once | INTRAVENOUS | Status: AC
  Administered 2024-11-13: 1000 mL via INTRAVENOUS

## 2024-11-13 MED ORDER — POLYETHYLENE GLYCOL 3350 17 G PO PACK: 17.0000 g | PACK | Freq: Every day | ORAL | Status: DC | PRN

## 2024-11-13 NOTE — ED Notes (Signed)
 EMS reported pt insisted on wearing o2 due to feeling sob. Pt educated o2 was a normal level. PT still insisted on o2

## 2024-11-13 NOTE — ED Triage Notes (Signed)
 Pt bib [facility] w/ c/o weakness, sob, and runny nose since yesterday. Pt reports frequent urination since yesterday. Pt reported she has not checked her CBG in over a week. CBG 295 on ems arrival then 245 after 150ml of normal saline.

## 2024-11-13 NOTE — ED Provider Notes (Signed)
 " [place] [facility] AT [facility] Provider Note   CSN: [id number] Arrival date & time: 11/13/24  1417     Patient presents with: Weakness and Shortness of Breath   Melanie Reeves is a 74 y.o. female.  She is brought in by ambulance from home.  Increasing weakness over the last few days.  Short of breath with any type of exertion.  Having difficulty getting up and ambulating due to weakness and shortness of breath.  Urinating frequently.  Runny nose.  Does not think has had a fever.  Nonproductive cough.  Sometimes has some lower abdominal pain.  No dysuria no constipation or diarrhea.  {Add pertinent medical, surgical, social history, OB history to YEP:[id number]} The history is provided by the patient.  Weakness Severity:  Severe Onset quality:  Gradual Timing:  Constant Progression:  Unchanged Chronicity:  New Relieved by:  Nothing Worsened by:  Activity Ineffective treatments:  Rest Associated symptoms: abdominal pain, cough, difficulty walking, frequency, nausea and shortness of breath   Associated symptoms: no chest pain, no diarrhea, no dysuria, no falls, no fever and no vomiting   Shortness of Breath Associated symptoms: abdominal pain and cough   Associated symptoms: no chest pain, no fever and no vomiting        Prior to Admission medications  Medication Sig Start Date End Date Taking? Authorizing Provider  atenolol  (TENORMIN ) 25 MG tablet Take 1 tablet (25 mg total) by mouth daily. 10/26/20   Evonnie Lenis, MD  B-D ULTRAFINE III SHORT PEN 31G X 8 MM MISC USE TO INJECT MEDICATION 5 TIMES DAILY. 06/13/21   Von [place], MD  Blood Glucose Monitoring Suppl (ONETOUCH VERIO) w/Device KIT UAD to monitor glucose tid; Dx: E11.65 07/09/18   [place]on [place], MD  cetirizine (ZYRTEC) 10 MG tablet Take 10 mg by mouth daily.  Take one tablet (10 mg dose) by mouth every morning. QAM    [provider]  Continuous Glucose Sensor (FREESTYLE LIBRE 3 SENSOR) MISC Place 1  sensor on the skin every 14 days. Use to check glucose continuously 06/17/23   [place], [place], FNP  diclofenac Sodium (VOLTAREN) 1 % GEL ) Topical 4 Times Daily PRN 02/27/23   [provider]  DULoxetine  (CYMBALTA ) 30 MG capsule TAKE 1 CAPSULE BY MOUTH DAILY. TAKE IN ADDITION TO THE 60 MG 09/30/22   Von [place], MD  DULoxetine  (CYMBALTA ) 60 MG capsule Take 60 mg by mouth daily. Taking in the AM    [provider]  ELIQUIS  5 MG TABS tablet TAKE 1 TABLET BY MOUTH TWICE A DAY 08/08/24   Okey Vina GAILS, MD  fenofibrate  160 MG tablet TAKE 1 TABLET BY MOUTH EVERY DAY 09/02/24   Okey Vina GAILS, MD  furosemide  (LASIX ) 40 MG tablet Take 1 tablet (40 mg total) by mouth daily as needed (leg swelling or weight gain of 2 lbs in a day or 5lbs in a week). 09/07/23   Trudy Birmingham, PA-C  glucose blood (ONETOUCH ULTRA) test strip USE AS INSTRUCTED TO CHECK BLOOD SUGAR THREE TIMES DAILY. 05/07/21   Von [place], MD  HYDROcodone -acetaminophen  (NORCO/VICODIN) 5-325 MG tablet Take 1 tablet by mouth every 6 (six) hours as needed for moderate pain.    [provider]  ibuprofen  (ADVIL ) 200 MG tablet Take 400 mg by mouth every 6 (six) hours as needed for fever.    [provider]  insulin  glargine, 2 Unit Dial , (TOUJEO  MAX SOLOSTAR) 300 UNIT/ML Solostar Pen Inject 80 Units  into the skin daily.    [provider]  losartan  (COZAAR ) 25 MG tablet TAKE 1 TABLET (25 MG TOTAL) BY MOUTH DAILY. 06/22/24   Okey Vina GAILS, MD  methocarbamol (ROBAXIN) 500 MG tablet Take by mouth.  Take one tablet (500 mg dose) by mouth 2 (two) times a day as needed (PRN MUSCLE SPASMS). 03/19/23   [provider]  nystatin (MYCOSTATIN/NYSTOP) powder Apply topically 2 (two) times daily.  Apply topically 2 (two) times daily. 03/17/23   [provider]  Omega-3 Fatty Acids (FISH OIL) 1000 MG CAPS Take 2,000 mg by mouth daily.    [provider]  pravastatin  (PRAVACHOL ) 40 MG tablet TAKE 1 TABLET  BY MOUTH EVERY DAY 02/05/22   Von [place], MD  pregabalin  (LYRICA ) 150 MG capsule Take 150 mg by mouth 2 (two) times daily. 06/16/24   [provider]  spironolactone  (ALDACTONE ) 25 MG tablet TAKE 1/2 TABLET BY MOUTH EVERY DAY 06/16/24   Williams, Evan, PA-C  tirzepatide  (MOUNJARO ) 15 MG/0.5ML Pen Inject 15 mg into the skin once a week. 10/18/24   Verdon Parry D, MD  traZODone  (DESYREL ) 100 MG tablet Take 100 mg by mouth at bedtime. 12/27/22   [provider]  Vitamin D , Ergocalciferol , (DRISDOL ) 1.25 MG (50000 UNIT) CAPS capsule Take 1 capsule (50,000 Units total) by mouth every 7 (seven) days. 10/18/24   Verdon Parry D, MD  XIGDUO XR 02-999 MG TB24 Take 1 tablet by mouth 2 (two) times daily.    [provider]    Allergies: Simvastatin and Sulfa antibiotics    Review of Systems  Constitutional:  Positive for fatigue. Negative for fever.  Respiratory:  Positive for cough and shortness of breath.   Cardiovascular:  Negative for chest pain.  Gastrointestinal:  Positive for abdominal pain and nausea. Negative for diarrhea and vomiting.  Genitourinary:  Positive for frequency. Negative for dysuria.  Musculoskeletal:  Negative for falls.  Neurological:  Positive for weakness.    Updated Vital Signs BP 108/87 (BP Location: Left Arm)   Pulse 90   Temp 98.1 F (36.7 C) (Oral)   Resp 19   SpO2 97%   Physical Exam Vitals and nursing note reviewed.  Constitutional:      General: She is not in acute distress.    Appearance: She is well-developed. She is obese.  HENT:     Head: Normocephalic and atraumatic.  Eyes:     Conjunctiva/sclera: Conjunctivae normal.  Cardiovascular:     Rate and Rhythm: Normal rate and regular rhythm.     Heart sounds: No murmur heard. Pulmonary:     Effort: Pulmonary effort is normal. No respiratory distress.     Breath sounds: Normal breath sounds.  Abdominal:     Palpations: Abdomen is soft.     Tenderness: There is no abdominal  tenderness. There is no guarding or rebound.  Musculoskeletal:        General: No swelling.     Cervical back: Neck supple.     Right lower leg: No tenderness. Edema present.     Left lower leg: No tenderness. Edema present.  Skin:    General: Skin is warm and dry.     Capillary Refill: Capillary refill takes less than 2 seconds.  Neurological:     General: No focal deficit present.     Mental Status: She is alert.     (all labs ordered are listed, but only abnormal results are displayed) Labs Reviewed  BASIC METABOLIC  PANEL WITH GFR  CBC    EKG: EKG Interpretation Date/Time:  Sunday November 13 2024 14:53:17 EST Ventricular Rate:  87 PR Interval:  191 QRS Duration:  169 QT Interval:  400 QTC Calculation: 482 R Axis:   -41  Text Interpretation: Sinus rhythm Left bundle branch block No significant change since prior 9/24 Confirmed by Towana Sharper [id number]) on 11/13/2024 3:11:05 PM  Radiology: No results found.  {Document cardiac monitor, telemetry assessment procedure when appropriate:32947} Procedures   Medications Ordered in the ED - No data to display  Clinical Course as of 11/13/24 1645  Sun Nov 13, 2024  1645 Cardiac echo 4/24 with EF of 55 to 60%. [MB]    Clinical Course User Index [MB] Towana Sharper BROCKS, MD   {Click here for ABCD2, HEART and other calculators REFRESH Note before signing:1}                              Medical Decision Making Amount and/or Complexity of Data Reviewed Labs: ordered. Radiology: ordered.  Risk OTC drugs. Prescription drug management.   This patient complains of ***; this involves an extensive number of treatment Options and is a complaint that carries with it a high risk of complications and morbidity. The differential includes ***  I ordered, reviewed and interpreted labs, which included *** I ordered medication *** and reviewed PMP when indicated. I ordered imaging studies which included *** and I independently     visualized and interpreted imaging which showed *** Additional history obtained from *** Previous records obtained and reviewed *** I consulted *** and discussed lab and imaging findings and discussed disposition.  Cardiac monitoring reviewed, *** Social determinants considered, *** Critical Interventions: ***  After the interventions stated above, I reevaluated the patient and found *** Admission and further testing considered, ***   {Document critical care time when appropriate  Document review of labs and clinical decision tools ie CHADS2VASC2, etc  Document your independent review of radiology images and any outside records  Document your discussion with family members, caretakers and with consultants  Document social determinants of health affecting pt's care  Document your decision making why or why not admission, treatments were needed:32947:::1}   Final diagnoses:  None    ED Discharge Orders     None        "

## 2024-11-14 DIAGNOSIS — N3000 Acute cystitis without hematuria: Secondary | ICD-10-CM | POA: Diagnosis present

## 2024-11-14 LAB — BASIC METABOLIC PANEL WITH GFR
Anion gap: 13 (ref 5–15)
BUN: 30 mg/dL — ABNORMAL HIGH (ref 8–23)
CO2: 21 mmol/L — ABNORMAL LOW (ref 22–32)
Calcium: 8.7 mg/dL — ABNORMAL LOW (ref 8.9–10.3)
Chloride: 105 mmol/L (ref 98–111)
Creatinine, Ser: 1.12 mg/dL — ABNORMAL HIGH (ref 0.44–1.00)
GFR, Estimated: 52 mL/min — ABNORMAL LOW
Glucose, Bld: 204 mg/dL — ABNORMAL HIGH (ref 70–99)
Potassium: 4.3 mmol/L (ref 3.5–5.1)
Sodium: 139 mmol/L (ref 135–145)

## 2024-11-14 LAB — GLUCOSE, CAPILLARY
Glucose-Capillary: 174 mg/dL — ABNORMAL HIGH (ref 70–99)
Glucose-Capillary: 210 mg/dL — ABNORMAL HIGH (ref 70–99)

## 2024-11-14 LAB — CBC
HCT: 33.5 % — ABNORMAL LOW (ref 36.0–46.0)
Hemoglobin: 10.7 g/dL — ABNORMAL LOW (ref 12.0–15.0)
MCH: 27.6 pg (ref 26.0–34.0)
MCHC: 31.9 g/dL (ref 30.0–36.0)
MCV: 86.6 fL (ref 80.0–100.0)
Platelets: 143 10*3/uL — ABNORMAL LOW (ref 150–400)
RBC: 3.87 MIL/uL (ref 3.87–5.11)
RDW: 15.6 % — ABNORMAL HIGH (ref 11.5–15.5)
WBC: 10.9 10*3/uL — ABNORMAL HIGH (ref 4.0–10.5)
nRBC: 0 % (ref 0.0–0.2)

## 2024-11-14 LAB — CBG MONITORING, ED
Glucose-Capillary: 219 mg/dL — ABNORMAL HIGH (ref 70–99)
Glucose-Capillary: 213 mg/dL — ABNORMAL HIGH (ref 70–99)

## 2024-11-14 MED ORDER — DULOXETINE HCL 30 MG PO CPEP
60.0000 mg | ORAL_CAPSULE | Freq: Every day | ORAL | Status: DC
  Administered 2024-11-14 – 2024-11-15 (×2): 60 mg via ORAL
  Filled 2024-11-14 (×2): qty 2

## 2024-11-14 MED ORDER — SPIRONOLACTONE 12.5 MG HALF TABLET
12.5000 mg | ORAL_TABLET | Freq: Every day | ORAL | Status: DC
  Administered 2024-11-14 – 2024-11-15 (×2): 12.5 mg via ORAL
  Filled 2024-11-14 (×2): qty 1

## 2024-11-14 MED ORDER — PRAVASTATIN SODIUM 40 MG PO TABS
40.0000 mg | ORAL_TABLET | Freq: Every day | ORAL | Status: DC
  Administered 2024-11-14 – 2024-11-15 (×2): 40 mg via ORAL
  Filled 2024-11-14 (×2): qty 1

## 2024-11-14 MED ORDER — PREGABALIN 50 MG PO CAPS
150.0000 mg | ORAL_CAPSULE | Freq: Two times a day (BID) | ORAL | Status: DC
  Administered 2024-11-14 – 2024-11-15 (×2): 150 mg via ORAL
  Filled 2024-11-14 (×2): qty 3

## 2024-11-14 MED ORDER — LOSARTAN POTASSIUM 25 MG PO TABS
25.0000 mg | ORAL_TABLET | Freq: Every day | ORAL | Status: DC
  Administered 2024-11-14 – 2024-11-15 (×2): 25 mg via ORAL
  Filled 2024-11-14 (×2): qty 1

## 2024-11-14 MED ORDER — FENOFIBRATE 160 MG PO TABS
160.0000 mg | ORAL_TABLET | Freq: Every day | ORAL | Status: DC
  Administered 2024-11-14 – 2024-11-15 (×2): 160 mg via ORAL
  Filled 2024-11-14 (×2): qty 1

## 2024-11-14 MED ORDER — SALINE SPRAY 0.65 % NA SOLN: 1.0000 | NASAL | Status: DC | PRN

## 2024-11-14 MED ORDER — ATENOLOL 25 MG PO TABS
25.0000 mg | ORAL_TABLET | Freq: Every day | ORAL | Status: DC
  Administered 2024-11-14 – 2024-11-15 (×2): 25 mg via ORAL
  Filled 2024-11-14 (×2): qty 1

## 2024-11-14 NOTE — Progress Notes (Signed)
" °   11/14/24 2045  BiPAP/CPAP/SIPAP  BiPAP/CPAP/SIPAP Pt Type Adult  Reason BIPAP/CPAP not in use Non-compliant (Patient refused CPAP HS.  Patient states that she does not wear one at home and does not want to wear it here either.)    "

## 2024-11-14 NOTE — Plan of Care (Signed)

## 2024-11-15 LAB — GLUCOSE, CAPILLARY
Glucose-Capillary: 174 mg/dL — ABNORMAL HIGH (ref 70–99)
Glucose-Capillary: 180 mg/dL — ABNORMAL HIGH (ref 70–99)
Glucose-Capillary: 224 mg/dL — ABNORMAL HIGH (ref 70–99)

## 2024-11-15 LAB — BASIC METABOLIC PANEL WITH GFR
Anion gap: 10 (ref 5–15)
BUN: 22 mg/dL (ref 8–23)
CO2: 22 mmol/L (ref 22–32)
Calcium: 9 mg/dL (ref 8.9–10.3)
Chloride: 104 mmol/L (ref 98–111)
Creatinine, Ser: 0.97 mg/dL (ref 0.44–1.00)
GFR, Estimated: >60 mL/min
Glucose, Bld: 177 mg/dL — ABNORMAL HIGH (ref 70–99)
Potassium: 4.2 mmol/L (ref 3.5–5.1)
Sodium: 137 mmol/L (ref 135–145)

## 2024-11-15 LAB — CBC
HCT: 33 % — ABNORMAL LOW (ref 36.0–46.0)
Hemoglobin: 10.1 g/dL — ABNORMAL LOW (ref 12.0–15.0)
MCH: 27.4 pg (ref 26.0–34.0)
MCHC: 30.6 g/dL (ref 30.0–36.0)
MCV: 89.7 fL (ref 80.0–100.0)
Platelets: 152 10*3/uL (ref 150–400)
RBC: 3.68 MIL/uL — ABNORMAL LOW (ref 3.87–5.11)
RDW: 15.9 % — ABNORMAL HIGH (ref 11.5–15.5)
WBC: 8.8 10*3/uL (ref 4.0–10.5)
nRBC: 0 % (ref 0.0–0.2)

## 2024-11-15 LAB — URINE CULTURE: Culture: >=100000 — AB

## 2024-11-15 NOTE — Telephone Encounter (Signed)
 Currently admitted.

## 2024-11-15 NOTE — Plan of Care (Signed)
  Problem: Education: Goal: Ability to describe self-care measures that may prevent or decrease complications (Diabetes Survival Skills Education) will improve Outcome: Progressing   Problem: Coping: Goal: Ability to adjust to condition or change in health will improve Outcome: Progressing   Problem: Health Behavior/Discharge Planning: Goal: Ability to manage health-related needs will improve Outcome: Progressing   

## 2024-11-15 NOTE — Hospital Course (Addendum)
 74 year old woman presented with generalized weakness for 2 days with inability to ambulate, upper respiratory symptoms and questionable urinary symptoms.  Was admitted for generalized weakness and concern for UTI.  Consultants None   Procedures/Events

## 2024-11-22 ENCOUNTER — Ambulatory Visit (INDEPENDENT_AMBULATORY_CARE_PROVIDER_SITE_OTHER): Admitting: Family Medicine

## 2024-12-20 ENCOUNTER — Ambulatory Visit (INDEPENDENT_AMBULATORY_CARE_PROVIDER_SITE_OTHER): Admitting: Family Medicine

## 2025-02-09 ENCOUNTER — Ambulatory Visit: Admitting: Internal Medicine
